# Patient Record
Sex: Female | Born: 1965 | Race: White | Hispanic: No | Smoking: Current every day smoker
Health system: Southern US, Community
[De-identification: ages and names within clinical notes are randomized; demographics above are authoritative.]

## PROBLEM LIST (undated history)

## (undated) ENCOUNTER — Ambulatory Visit: Payer: BC Managed Care – PPO

## (undated) DIAGNOSIS — M545 Low back pain: Secondary | ICD-10-CM

## (undated) DIAGNOSIS — K5792 Diverticulitis of intestine, part unspecified, without perforation or abscess without bleeding: Secondary | ICD-10-CM

## (undated) DIAGNOSIS — K589 Irritable bowel syndrome without diarrhea: Secondary | ICD-10-CM

## (undated) DIAGNOSIS — R310 Gross hematuria: Secondary | ICD-10-CM

## (undated) DIAGNOSIS — K2900 Acute gastritis without bleeding: Secondary | ICD-10-CM

## (undated) DIAGNOSIS — M461 Sacroiliitis, not elsewhere classified: Secondary | ICD-10-CM

## (undated) DIAGNOSIS — A0472 Enterocolitis due to Clostridium difficile, not specified as recurrent: Secondary | ICD-10-CM

## (undated) DIAGNOSIS — K5732 Diverticulitis of large intestine without perforation or abscess without bleeding: Secondary | ICD-10-CM

## (undated) DIAGNOSIS — M546 Pain in thoracic spine: Secondary | ICD-10-CM

## (undated) DIAGNOSIS — N200 Calculus of kidney: Secondary | ICD-10-CM

## (undated) DIAGNOSIS — I10 Essential (primary) hypertension: Secondary | ICD-10-CM

## (undated) DIAGNOSIS — L748 Other eccrine sweat disorders: Secondary | ICD-10-CM

## (undated) DIAGNOSIS — N83209 Unspecified ovarian cyst, unspecified side: Secondary | ICD-10-CM

## (undated) DIAGNOSIS — G4700 Insomnia, unspecified: Secondary | ICD-10-CM

## (undated) DIAGNOSIS — K37 Unspecified appendicitis: Secondary | ICD-10-CM

## (undated) DIAGNOSIS — R109 Unspecified abdominal pain: Secondary | ICD-10-CM

## (undated) DIAGNOSIS — Z87442 Personal history of urinary calculi: Secondary | ICD-10-CM

## (undated) HISTORY — PX: TONSILLECTOMY: SUR1361

## (undated) HISTORY — DX: Pain in thoracic spine: M54.6

## (undated) HISTORY — DX: Unspecified appendicitis: K37

## (undated) HISTORY — DX: Other eccrine sweat disorders: L74.8

## (undated) HISTORY — PX: HEMORRHOID SURGERY: SHX153

## (undated) HISTORY — PX: OTHER SURGICAL HISTORY: SHX169

## (undated) HISTORY — PX: ABDOMINAL HYSTERECTOMY: SHX81

## (undated) HISTORY — PX: TOTAL ABDOMINAL HYSTERECTOMY W/ BILATERAL SALPINGOOPHORECTOMY: SHX83

## (undated) HISTORY — DX: Calculus of kidney: N20.0

## (undated) HISTORY — DX: Gross hematuria: R31.0

## (undated) HISTORY — DX: Acute gastritis without bleeding: K29.00

## (undated) HISTORY — DX: Personal history of urinary calculi: Z87.442

## (undated) HISTORY — DX: Diverticulitis of large intestine without perforation or abscess without bleeding: K57.32

## (undated) HISTORY — DX: Insomnia, unspecified: G47.00

## (undated) HISTORY — DX: Unspecified abdominal pain: R10.9

## (undated) HISTORY — PX: TUBAL LIGATION: SHX77

## (undated) HISTORY — DX: Diverticulitis of intestine, part unspecified, without perforation or abscess without bleeding: K57.92

## (undated) HISTORY — DX: Sacroiliitis, not elsewhere classified: M46.1

## (undated) HISTORY — DX: Enterocolitis due to Clostridium difficile, not specified as recurrent: A04.72

## (undated) HISTORY — DX: Low back pain: M54.5

## (undated) HISTORY — PX: LITHOTRIPSY: SUR834

## (undated) HISTORY — DX: Unspecified ovarian cyst, unspecified side: N83.209

---

## 1997-11-21 HISTORY — PX: OTHER SURGICAL HISTORY: SHX169

## 2004-01-20 ENCOUNTER — Ambulatory Visit: Payer: Self-pay | Admitting: Surgery

## 2004-04-13 ENCOUNTER — Encounter: Payer: Self-pay | Admitting: Specialist

## 2004-05-06 ENCOUNTER — Encounter: Payer: Self-pay | Admitting: Specialist

## 2006-03-17 ENCOUNTER — Ambulatory Visit: Payer: Self-pay | Admitting: Urology

## 2008-04-05 ENCOUNTER — Ambulatory Visit: Payer: Self-pay | Admitting: Oncology

## 2010-05-06 ENCOUNTER — Ambulatory Visit: Payer: Self-pay | Admitting: Unknown Physician Specialty

## 2010-10-23 ENCOUNTER — Ambulatory Visit: Payer: Self-pay | Admitting: Urology

## 2010-11-02 ENCOUNTER — Ambulatory Visit: Payer: Self-pay | Admitting: Urology

## 2010-11-10 ENCOUNTER — Ambulatory Visit: Payer: Self-pay | Admitting: Urology

## 2010-11-12 ENCOUNTER — Ambulatory Visit: Payer: Self-pay | Admitting: Urology

## 2010-11-19 ENCOUNTER — Ambulatory Visit: Payer: Self-pay | Admitting: Urology

## 2010-11-25 ENCOUNTER — Ambulatory Visit: Payer: Self-pay | Admitting: Urology

## 2010-11-26 ENCOUNTER — Ambulatory Visit: Payer: Self-pay | Admitting: Urology

## 2010-11-26 HISTORY — PX: CYSTOSCOPY: SUR368

## 2010-12-01 ENCOUNTER — Ambulatory Visit: Payer: Self-pay | Admitting: Urology

## 2010-12-03 ENCOUNTER — Ambulatory Visit: Payer: Self-pay | Admitting: Urology

## 2010-12-05 ENCOUNTER — Emergency Department: Payer: Self-pay | Admitting: Physician Assistant

## 2010-12-10 ENCOUNTER — Ambulatory Visit: Payer: Self-pay | Admitting: Urology

## 2010-12-17 ENCOUNTER — Ambulatory Visit: Payer: Self-pay | Admitting: Urology

## 2010-12-23 ENCOUNTER — Ambulatory Visit: Payer: Self-pay | Admitting: Urology

## 2011-01-07 ENCOUNTER — Ambulatory Visit: Payer: Self-pay | Admitting: Urology

## 2011-01-21 ENCOUNTER — Ambulatory Visit: Payer: Self-pay | Admitting: Urology

## 2011-06-29 ENCOUNTER — Ambulatory Visit: Payer: Self-pay

## 2011-11-30 ENCOUNTER — Ambulatory Visit: Payer: Self-pay | Admitting: Urology

## 2011-12-23 ENCOUNTER — Ambulatory Visit: Payer: Self-pay | Admitting: Urology

## 2011-12-27 ENCOUNTER — Ambulatory Visit: Payer: Self-pay | Admitting: Urology

## 2012-02-01 ENCOUNTER — Ambulatory Visit: Payer: Self-pay | Admitting: Urology

## 2012-07-20 ENCOUNTER — Ambulatory Visit: Payer: Self-pay | Admitting: Gastroenterology

## 2012-07-21 LAB — PATHOLOGY REPORT

## 2012-08-01 ENCOUNTER — Ambulatory Visit: Payer: Self-pay | Admitting: Gastroenterology

## 2012-09-11 ENCOUNTER — Emergency Department: Payer: Self-pay | Admitting: Emergency Medicine

## 2012-09-11 LAB — URINALYSIS, COMPLETE
Bilirubin,UR: NEGATIVE
Glucose,UR: NEGATIVE mg/dL (ref 0–75)
Leukocyte Esterase: NEGATIVE
Ph: 5 (ref 4.5–8.0)
Protein: NEGATIVE
Specific Gravity: 1.021 (ref 1.003–1.030)
Squamous Epithelial: 16
WBC UR: 2 /HPF (ref 0–5)

## 2012-09-11 LAB — CBC
HCT: 44.1 % (ref 35.0–47.0)
MCH: 32.1 pg (ref 26.0–34.0)
MCHC: 34.6 g/dL (ref 32.0–36.0)
MCV: 93 fL (ref 80–100)
Platelet: 274 10*3/uL (ref 150–440)
RBC: 4.75 10*6/uL (ref 3.80–5.20)
RDW: 14.2 % (ref 11.5–14.5)
WBC: 12.4 10*3/uL — ABNORMAL HIGH (ref 3.6–11.0)

## 2012-09-11 LAB — COMPREHENSIVE METABOLIC PANEL
Albumin: 3.8 g/dL (ref 3.4–5.0)
Alkaline Phosphatase: 108 U/L (ref 50–136)
Anion Gap: 6 — ABNORMAL LOW (ref 7–16)
Bilirubin,Total: 0.3 mg/dL (ref 0.2–1.0)
Calcium, Total: 8.9 mg/dL (ref 8.5–10.1)
Creatinine: 0.67 mg/dL (ref 0.60–1.30)
EGFR (African American): >60
EGFR (Non-African Amer.): >60
Osmolality: 275 (ref 275–301)
Potassium: 3.7 mmol/L (ref 3.5–5.1)
SGOT(AST): 15 U/L (ref 15–37)
SGPT (ALT): 18 U/L (ref 12–78)
Sodium: 138 mmol/L (ref 136–145)

## 2012-09-11 LAB — LIPASE, BLOOD: Lipase: 88 U/L (ref 73–393)

## 2012-11-02 ENCOUNTER — Ambulatory Visit: Payer: Self-pay | Admitting: Urology

## 2013-01-30 ENCOUNTER — Ambulatory Visit: Payer: Self-pay | Admitting: Urology

## 2013-05-19 ENCOUNTER — Ambulatory Visit: Payer: Self-pay | Admitting: Emergency Medicine

## 2013-05-24 ENCOUNTER — Ambulatory Visit: Payer: Self-pay | Admitting: Urology

## 2013-07-02 ENCOUNTER — Ambulatory Visit: Payer: Self-pay | Admitting: Obstetrics and Gynecology

## 2013-07-02 LAB — HEMOGLOBIN: HGB: 15.2 g/dL (ref 12.0–16.0)

## 2013-07-05 LAB — PATHOLOGY REPORT

## 2013-07-19 ENCOUNTER — Ambulatory Visit: Payer: Self-pay | Admitting: Obstetrics and Gynecology

## 2013-08-03 HISTORY — PX: ABDOMINAL HYSTERECTOMY: SUR658

## 2013-08-03 HISTORY — PX: TOTAL ABDOMINAL HYSTERECTOMY: SHX209

## 2013-08-14 ENCOUNTER — Ambulatory Visit: Payer: Self-pay | Admitting: Obstetrics and Gynecology

## 2013-08-14 LAB — CBC
HCT: 44.8 % (ref 35.0–47.0)
HGB: 15.2 g/dL (ref 12.0–16.0)
MCH: 32.1 pg (ref 26.0–34.0)
MCHC: 33.9 g/dL (ref 32.0–36.0)
MCV: 95 fL (ref 80–100)
Platelet: 280 10*3/uL (ref 150–440)
RBC: 4.72 10*6/uL (ref 3.80–5.20)
RDW: 14.2 % (ref 11.5–14.5)
WBC: 9.4 10*3/uL (ref 3.6–11.0)

## 2013-08-14 LAB — BASIC METABOLIC PANEL
Anion Gap: 5 — ABNORMAL LOW (ref 7–16)
BUN: 16 mg/dL (ref 7–18)
CO2: 28 mmol/L (ref 21–32)
CREATININE: 0.67 mg/dL (ref 0.60–1.30)
Calcium, Total: 9.1 mg/dL (ref 8.5–10.1)
Chloride: 107 mmol/L (ref 98–107)
EGFR (African American): >60
EGFR (Non-African Amer.): >60
Glucose: 68 mg/dL (ref 65–99)
Osmolality: 279 (ref 275–301)
Potassium: 4 mmol/L (ref 3.5–5.1)
Sodium: 140 mmol/L (ref 136–145)

## 2013-08-20 ENCOUNTER — Ambulatory Visit: Payer: Self-pay | Admitting: Obstetrics and Gynecology

## 2013-08-21 LAB — HEMOGLOBIN: HGB: 12.3 g/dL (ref 12.0–16.0)

## 2013-08-22 LAB — PATHOLOGY REPORT

## 2014-03-11 ENCOUNTER — Ambulatory Visit: Payer: Self-pay | Admitting: Urology

## 2014-07-24 ENCOUNTER — Ambulatory Visit
Admit: 2014-07-24 | Disposition: A | Discharge status: Home / Self Care | Payer: Self-pay | Attending: Obstetrics and Gynecology | Admitting: Obstetrics and Gynecology

## 2014-07-27 NOTE — Consult Note (Signed)
Brief Consult Note: Diagnosis: 50 yr old female with vaginal hysterectomy develped symptomatic bradycardia with opiates.seen in room.   Patient was seen by consultant.   Consult note dictated.   Orders entered.   Comments: 49 yr old female s/p vaginal TAH with bradycardia due to opates avoid IV morphine,fentanyl,dialudid(any opate form),try tyelenol and toradol as you are doing,some evidece to give IV magenseium to help decrease post op pain:will order that.  Electronic Signatures: Epifanio Lesches (MD)  (Signed 18-May-15 17:29)  Authored: Brief Consult Note   Last Updated: 18-May-15 17:29 by Epifanio Lesches (MD)

## 2014-07-27 NOTE — Op Note (Signed)
PATIENT NAME:  Morgan Cisneros, Morgan Cisneros MR#:  [id number] DATE OF BIRTH:  [birth year]  DATE OF PROCEDURE:  07/02/2013  PREOPERATIVE DIAGNOSIS: Chronic pelvic pain.   POSTOPERATIVE DIAGNOSES:  1. Chronic pelvic pain.  2. Endometriosis.  3. Pelvic adhesive disease.  4. Family history of ovarian cancer.  5. Bilateral ovarian cysts.   OPERATIVE PROCEDURES:  1. Laparoscopy with excision and fulguration of endometriosis.  2. Lysis of adhesions.   SURGEON: Alanda Slim. Kyndra Condron, MD  FIRST ASSISTANT: None.   ANESTHESIA: General endotracheal.   INDICATIONS: The patient is a 49 year old white female, P2, 0-0-2, status post BTL for contraception, who presents for evaluation of chronic pelvic pain and family history of ovarian cancer. The patient has significant worsening symptoms of dysmenorrhea, deep thrusting dyspareunia, and menorrhagia.   FINDINGS AT SURGERY: Revealed pelvic adhesions between the omentum and the left fallopian tube. These adhesions were lysed with Bovie cautery and scissors. This restored normal anatomy. There was evidence of endometriosis with powder burn implants in the left ovarian fossa, right uterosacral ligament, as well as white lesions being seen on the bladder flap and uterus. Representative excisional biopsies of the uterosacral ligament and left ovarian fossa were taken; these sites were also cauterized. Upper abdomen was grossly normal.   DESCRIPTION OF PROCEDURE: The patient was brought to the operating room where she was placed in the supine position. General endotracheal anesthesia was induced without difficulty. She was placed in dorsal lithotomy position using the bumblebee stirrups. A ChloraPrep and Betadine abdominal, perineal, intravaginal prep and drape was performed in standard fashion. Red rubber Catheter was used to drain 50 mL of urine from the bladder. A Hulka tenaculum was placed onto the cervix. The cervix was noted to have IUD strings present at the os. Uterus is  anteverted, mobile and of normal size and shape. The pelvis was gynecoid. The laparoscopy was then performed in standard fashion. A subumbilical vertical incision 5 mm in length was made. The Optiview laparoscopic trocar system was used to place the 5-mm port directly into the abdominopelvic cavity under direct visualization without evidence of bowel or vascular injury. A second 5-mm port was placed in the suprapubic region under direct visualization. The above-noted findings were photo documented. The adhesiolysis between the fallopian tube and omentum was performed with Bovie cautery and scissors. Good hemostasis was noted. The endometriosis implants were photo documented. The left ovarian fossa and right uterosacral ligaments powder burn implants were then excised and cauterized using Kleppinger bipolar forceps. The representative samples of tissue were sent to pathology. Good hemostasis was noted. Upon completion of the procedure and survey of the upper abdomen, the procedure was terminated with all instrumentation being removed from the abdomen. The pneumoperitoneum was released. The incisions were closed with of 4-0 Vicryl suture in a simple interrupted manner. Band-Aids were placed over the incisions. IV fluids were 1600 mL urine output was 50 mL.   ESTIMATED BLOOD LOSS: Minimal.   COUNTS: All needle, and sponge counts instruments were verified as correct.   ADDENDUM: Please note that this patient is an excellent LAVH/BSO candidate.    ____________________________ Alanda Slim. Lithzy Bernard, MD mad:lt D: 07/02/2013 16:07:59 ET T: 07/03/2013 00:09:21 ET JOB#: [id number]  cc: Hassell Done A. Kayliee Atienza, MD, <Dictator> [place] MD ELECTRONICALLY SIGNED 07/13/2013 4:44

## 2014-07-27 NOTE — Op Note (Signed)
PATIENT NAME:  Morgan Cisneros, Morgan Cisneros MR#:  [id number] DATE OF BIRTH:  [birth year]  DATE OF PROCEDURE:  08/20/2013  PREOPERATIVE DIAGNOSES:  1.  Chronic pelvic pain.  2.  Symptomatic endometriosis.  3.  Family history of ovarian cancer.   POSTOPERATIVE DIAGNOSES: 1.  Chronic pelvic pain.  2.  Symptomatic endometriosis.  3.  Family history of ovarian cancer.   OPERATIVE PROCEDURE: Laparoscopic-assisted vaginal hysterectomy, bilateral salpingo-oophorectomy.   SURGEON: Brayton Mars, M.D.   FIRST ASSISTANT: Dr. Marcelline Mates.  ANESTHESIA: General endotracheal.   INDICATIONS: The patient is a 49 year old white female, para 2-0-0-2, status post BTL for contraception with known history of endometriosis, recently status post laparoscopy with excision and fulguration of endometriosis and adhesiolysis, who presents for definitive surgery. Mom has ovarian cancer. The patient desires prophylactic oophorectomy.   FINDINGS AT SURGERY: Revealed a left ovarian endometrioma. There also were some endometriosis implants along the left pelvic sidewall. The bladder flap had mild scarring.   DESCRIPTION OF PROCEDURE: The patient was brought to the operating room where she was placed in the supine position. General endotracheal anesthesia was induced without difficulty. She was placed in the dorsal lithotomy position using the bumblebee stirrups. A ChloraPrep and Betadine abdominal, perineal, intravaginal prep and drape was performed in standard fashion. Foley catheter was placed and was draining clear yellow urine from the bladder. Hulka tenaculum was placed on the cervix to facilitate uterine manipulation. Subumbilical vertical incision 5 mm in length was made. The Optiview laparoscopic trocar system was used to place a 5 mm port directly into the abdominopelvic cavity. No bowel or vascular injury was encountered. Two other 5 mm ports were placed in the right and left lower quadrants respectively under direct  visualization. The above-noted findings were photo documented. The procedure was then performed in a standard manner. The Ace Harmonic scalpel was used during the dissection along with graspers and Kleppinger bipolar cautery forceps. The round ligaments were desiccated with the Harmonic scalpel. The infundibulopelvic ligaments were sequentially grasped, desiccated and cut using the Harmonic scalpel. The mesosalpinx cardinal broad ligament complexes were then likewise clamped, desiccated and cut using the Harmonic scalpel. The anterior-posterior leafs of the broad ligament were skeletonized exposing the uterine vessels. The uterine vessels were then cauterized using Kleppinger bipolar forceps. Bladder flap was created through use of the Harmonic scalpel. Once adequately mobilized, the abdominal portion of the procedure was terminated and the vaginal approach was then performed.   The patient was placed in the high lithotomy position. A weighted speculum was placed into the vagina. The Hulka tenaculum was removed and a double-tooth tenaculum was placed onto the cervix. Posterior colpotomy was made with Mayo scissors. Uterosacral ligaments were clamped, cut, and stick tied using 0 Vicryl suture. The posterior cuff was run using 0 chromic suture in a running baseball stitch manner. Bladder flap was created using the Bovie cautery. Bladder was dissected off the lower uterine segment. The anterior cul-de-sac was entered. The remainder of the cardinal broad ligament complexes were clamped, coagulated and cut using 0 Vicryl suture. The specimen was then removed from the operative field. Good hemostasis was noted on pedicles. The vagina was closed with 2-0 chromic sutures in a simple interrupted manner. Following completion of the vaginal closure, the repeat laparoscopy was performed verifying good hemostasis of all pedicles. Ureters were noted to have normal anatomic orientation and peristalsis. The procedure was then  terminated with all instrumentation being removed from the abdominopelvic cavity. The pneumoperitoneum was released. The abdominal  incisions were closed with Dermabond glue. The patient was then awakened, extubated and taken to the recovery room in satisfactory condition.   ESTIMATED BLOOD LOSS: 100 mL.   IV FLUIDS: 1500 mL.  URINE OUTPUT: 100 mL.  ANTIBIOTICS: The patient did receive Ancef antibiotic prophylaxis.   COUNTS: All instrument, needle and sponge counts were verified as correct.  ____________________________ Alanda Slim. Tennelle Taflinger, MD mad:aw D: 08/20/2013 93:26:71 ET T: 08/20/2013 12:59:20 ET JOB#: [id number]  cc: Hassell Done A. Lakia Gritton, MD, <Dictator> [facility] MD ELECTRONICALLY SIGNED 08/20/2013 13:45

## 2014-07-27 NOTE — Consult Note (Signed)
PATIENT NAME:  Morgan Cisneros, Morgan Cisneros MR#:  [id number] DATE OF BIRTH:  1965/06/03  DATE OF CONSULTATION:  08/20/2013  REFERRING PHYSICIAN:  Dr. Enzo Bi.  CONSULTING PHYSICIAN:  Epifanio Lesches, MD  PRIMARY CARE PHYSICIAN:  Dr. Enzo Bi.   REASON FOR CONSULTATION:  Bradycardia.  HISTORY OF PRESENT ILLNESS:  The patient is a 49 year old female patient admitted after laparoscopic bilateral hysterectomy with bilateral salpingo-oophorectomy, seen in the room. Medical consult was placed in because of bradycardia. The patient developed bradycardia after administration of IV fentanyl and also IV morphine. The patient had a dose of fentanyl and Versed during the procedure and heart rate dropped to as low as 40s. The patient received a dose of atropine in [place]. <<.  After coming to the floor, the patient received morphine 2 mg at 2:30 and also 3:30.   The patient's heart rate dropped to 50s after the second dose at 3:30. The patient also felt like she was going to pass out because of the low heart rate. The patient also got meperidine 12.5 mg at 1:30 and 3 more doses before 1:50.  The patient right now in sinus rhythm, heart rate of 70 beats per minute. Family at bedside. Having significant pain postoperatively. The patient's IV pain meds, especially opiates, are suspended and she is on IV drip Tylenol drip and also Toradol.   PAST MEDICAL HISTORY:  No hypertension. No diabetes.   ALLERGIES:  None.   SOCIAL HISTORY: No smoking. No drinking. Lives with family.   PAST SURGICAL HISTORY: None.   MEDICATIONS: None.   REVIEW OF SYSTEMS:  CONSTITUTIONAL: Denies any fever.  HEENT: No headache. No ear pain. No epistaxis. No difficulty swallowing.  CARDIOVASCULAR: No chest pain.  GASTROINTESTINAL: The patient has some nausea and postoperative abdominal pain.  GENITOURINARY:  The patient's urine output is adequate. PULMONARY:  No  trouble breathing.  CARDIOVASCULAR:  No chest pain.  PSYCHIATRIC:  No  anxiety or insomnia.  NEUROLOGIC:  No history of strokes.   PHYSICAL EXAMINATION:  VITAL SIGNS:  Temperature 97.8, heart rate was at low as 40s in PACU and while she came here, heart rate dropped to 50s at 3:40, but the rest of the time, it was around 70s. Blood pressure 119/72.  Sats 100% on room air.  GENERAL:  The patient alert, awake, oriented, in moderate distress because of the pain.  HEAD:  Atraumatic, normocephalic.  EYES:  Pupils equal, reacting to light. Extraocular movements intact.  ENT: No tympanic membrane congestion. No turbinate hypertrophy. No oropharyngeal erythema. NECK:  Supple. No JVD. No carotid bruit. CARDIOVASCULAR:  S1, S2 regular. No murmurs.  LUNGS:  Clear to auscultation. No wheeze. No rales.  ABDOMEN:  Soft, nontender, nondistended. Bowel sounds present.  EXTREMITIES:  No extremity edema. No cyanosis. No clubbing.  NEUROLOGIC:  Alert, awake, oriented. Cranial nerves II through XII are intact. Power 5/5 upper and lower extremities.  DTRs are 2+ bilaterally.   LABORATORY DATA:  The patient did not have any labs today.  White count was 9.4, hemoglobin 15.2 on the 12th of May. Electrolytes were normal on May 12.  The patient's EKG showed sinus rhythm at 72 beats per minute.   ASSESSMENT AND PLAN: This is a 49 year old female with bradycardia that developed after opiate ingestion administration, so bradycardia is likely secondary to opiates. At this time, we  have limited options, especially in terms of pain control. The patient can be on acetaminophen drip along with Toradol that you are already doing. I would  recommend continuing to hold all the IV pain medications, which are like narcotic pain medications including morphine, fentanyl, IV Dilaudid. The patient can continue p.o. Percocet if she tolerates and use nausea medicine, Phenergan, and see if that helps. The patient's family is aware of this and I told them about the possible side effects with morphine and fentanyl  and meperidine, and we will avoid those IV pain medications at this time.   Time spent on this consult is about 55 minutes.    ____________________________ Epifanio Lesches, MD sk:dmm D: 08/20/2013 17:35:09 ET T: 08/20/2013 19:06:59 ET JOB#: [id number]  cc: Epifanio Lesches, MD, <Dictator> Epifanio Lesches MD ELECTRONICALLY SIGNED 08/30/2013 12:27

## 2014-10-22 ENCOUNTER — Encounter: Payer: Self-pay | Admitting: *Deleted

## 2014-10-24 ENCOUNTER — Other Ambulatory Visit: Payer: Self-pay

## 2014-10-24 LAB — URINALYSIS, COMPLETE
Bilirubin, UA: NEGATIVE
Glucose, UA: NEGATIVE
KETONES UA: NEGATIVE
LEUKOCYTES UA: NEGATIVE
Nitrite, UA: NEGATIVE
PH UA: 7.5 (ref 5.0–7.5)
Protein, UA: NEGATIVE
Specific Gravity, UA: 1.015 (ref 1.005–1.030)
Urobilinogen, Ur: 0.2 mg/dL (ref 0.2–1.0)

## 2014-10-24 LAB — MICROSCOPIC EXAMINATION: Epithelial Cells (non renal): >10 /hpf — AB (ref 0–10)

## 2014-12-13 DIAGNOSIS — M545 Low back pain, unspecified: Secondary | ICD-10-CM | POA: Insufficient documentation

## 2014-12-13 DIAGNOSIS — R300 Dysuria: Secondary | ICD-10-CM | POA: Insufficient documentation

## 2014-12-13 DIAGNOSIS — N393 Stress incontinence (female) (male): Secondary | ICD-10-CM | POA: Insufficient documentation

## 2014-12-13 DIAGNOSIS — N200 Calculus of kidney: Secondary | ICD-10-CM | POA: Insufficient documentation

## 2014-12-13 HISTORY — DX: Low back pain, unspecified: M54.50

## 2014-12-23 ENCOUNTER — Other Ambulatory Visit: Payer: Self-pay | Admitting: Obstetrics and Gynecology

## 2014-12-30 ENCOUNTER — Other Ambulatory Visit: Payer: Self-pay | Admitting: Obstetrics and Gynecology

## 2014-12-30 NOTE — Telephone Encounter (Signed)
pls advise

## 2015-01-15 ENCOUNTER — Ambulatory Visit
Admission: RE | Admit: 2015-01-15 | Discharge: 2015-01-15 | Disposition: A | Discharge status: Home / Self Care | Payer: BC Managed Care – PPO | Source: Ambulatory Visit | Attending: Obstetrics and Gynecology | Admitting: Obstetrics and Gynecology

## 2015-01-15 ENCOUNTER — Ambulatory Visit (INDEPENDENT_AMBULATORY_CARE_PROVIDER_SITE_OTHER): Payer: BC Managed Care – PPO | Admitting: Obstetrics and Gynecology

## 2015-01-15 ENCOUNTER — Encounter: Payer: Self-pay | Admitting: Obstetrics and Gynecology

## 2015-01-15 VITALS — BP 113/75 | HR 80 | Ht 65.0 in | Wt 145.4 lb

## 2015-01-15 DIAGNOSIS — Z8041 Family history of malignant neoplasm of ovary: Secondary | ICD-10-CM

## 2015-01-15 DIAGNOSIS — F17210 Nicotine dependence, cigarettes, uncomplicated: Secondary | ICD-10-CM | POA: Insufficient documentation

## 2015-01-15 DIAGNOSIS — Z720 Tobacco use: Secondary | ICD-10-CM

## 2015-01-15 DIAGNOSIS — R590 Localized enlarged lymph nodes: Secondary | ICD-10-CM | POA: Diagnosis present

## 2015-01-15 DIAGNOSIS — Z800 Family history of malignant neoplasm of digestive organs: Secondary | ICD-10-CM | POA: Diagnosis not present

## 2015-01-15 MED ORDER — DOXYCYCLINE HYCLATE 100 MG PO CAPS: 100.0000 mg | ORAL_CAPSULE | Freq: Two times a day (BID) | ORAL | Status: DC

## 2015-01-15 NOTE — Patient Instructions (Addendum)
1.  CBC. 2.  Comprehensive metabolic panel. 3.  ESR. 4.  PA and lateral chest x-ray. 5.  Return in 1 week for follow-up. 6.  Doxycycline 100 mg twice a day 14 days

## 2015-01-15 NOTE — Progress Notes (Signed)
Patient ID: Morgan Cisneros, female   DOB: [birth year], 49 y.o.   MRN: [id number] Bump under right arm  red swollen and tender x 2d No fever No draining or oozing ma mmo- 07/2014- wnl  GYN ENCOUNTER NOTE  Subjective:       Morgan Cisneros is a 49 y.o. No obstetric history on file. female is here for gynecologic evaluation of the following issues:  1.Tender arm pit  The patient is a 49 year old white female, menopausal, on estradiol patch, presents for evaluation of 2 day history of right axillary tenderness and lumps.  Symptoms occur 2 days ago.  She does not recall any trauma to the extremity.  She does not have any scratches or cuts on her extremity.  She denies fever, chills or sweats.  No history of cough, sore throat, congestion.  Patient is a smoker. Patient has family history of colon cancer, ovarian cancer in her mother. Recent mammogram was negative.   Gynecologic History No LMP recorded. Patient has had a hysterectomy. Contraception: Status post hysterectomy Last mammogram: ma mmo- 07/2014- wnl  Obstetric History OB History  No data available    Past Medical History  Diagnosis Date  . Renal calculus, right   . Thoracic back pain     Right sided  . Ovarian cyst   . Flank pain   . Abdominal pain   . Gross hematuria   . History of kidney stones     Past Surgical History  Procedure Laterality Date  . Abdominal hysterectomy  08/2013  . Cystoscopy  11/26/2010    with stent placement  . Lithotripsy      X 4  . Tonsillectomy    . Tubal ligation    . Ureteroscopy Right 11/21/1997    with holmium laser    Current Outpatient Prescriptions on File Prior to Visit  Medication Sig Dispense Refill  . CHANTIX 1 MG tablet TAKE 1 TABLET BY MOUTH TWICE DAILY. 60 tablet 0  . Meth-Hyo-M Bl-Na Phos-Ph Sal (URIBEL) 118 MG CAPS Take 118 mg by mouth as needed.    Marland Kitchen MINIVELLE 0.05 MG/24HR patch UNWRAP AND APPLY 1 PATCH TO SKIN EVERY 3 TO 4 DAYS 24 patch 0  . polyethylene  glycol (MIRALAX / GLYCOLAX) packet Take 17 g by mouth daily.     No current facility-administered medications on file prior to visit.    Allergies  Allergen Reactions  . Urocit-K [Potassium Citrate] Nausea Only    Social History   Social History  . Marital Status: Married    Spouse Name: N/A  . Number of Children: N/A  . Years of Education: N/A   Occupational History  . Not on file.   Social History Main Topics  . Smoking status: Former Research scientist (life sciences)  . Smokeless tobacco: Not on file  . Alcohol Use: 0.0 oz/week    0 Standard drinks or equivalent per week     Comment: occasional  . Drug Use: No  . Sexual Activity: Yes    Birth Control/ Protection: Surgical   Other Topics Concern  . Not on file   Social History Narrative    Family History  Problem Relation Age of Onset  . Kidney Stones Mother   . Ovarian cancer Mother   . Colon cancer Mother     The following portions of the patient's history were reviewed and updated as appropriate: allergies, current medications, past family history, past medical history, past social history, past surgical history and problem list.  Review  of Systems Review of Systems - General ROS: negative for - chills, fatigue, fever, hot flashes, malaise or night sweats Hematological and Lymphatic ROS: negative for - bleeding problems.  POSITIVE-glands and right axilla, swollen Gastrointestinal ROS: negative for - abdominal pain, blood in stools, change in bowel habits and nausea/vomiting Musculoskeletal ROS: negative for - joint pain, muscle pain or muscular weakness Genito-Urinary ROS: negative for - change in menstrual cycle, dysmenorrhea, dyspareunia, dysuria, genital discharge, genital ulcers, hematuria, incontinence, irregular/heavy menses, nocturia or pelvic pain  Objective:   BP 113/75 mmHg  Pulse 80  Ht $R'5\' 5"'VU$  (1.651 m)  Wt 145 lb 6.4 oz (65.953 kg)  BMI 24.20 kg/m2 CONSTITUTIONAL: Well-developed, well-nourished female in no acute  distress.  HENT:  Normocephalic, atraumatic.  NECK: Normal range of motion, supple, no masses; No supraclavicular adenopathy; no anterior neck adenopathy,  Normal thyroid.  SKIN: Skin is warm and dry. No rash noted. Not diaphoretic. No erythema. No pallor. [place]: Alert and oriented to person, place, and time. PSYCHIATRIC: Normal mood and affect. Normal behavior. Normal judgment and thought content. CARDIOVASCULAR:Regular rate and rhythm without murmur RESPIRATORY: Normal breath sounds BREASTS: Bilaterally symmetric without dominant mass, adenopathy or nipple discharge; Right axillary adenopathy with Matted  Nodes, Tender ABDOMEN: Soft, non distended; Non tender.  No Organomegaly. PELVIC:Deferred MUSCULOSKELETAL: Normal range of motion. No tenderness.  No cyanosis, clubbing, or edema.     Assessment:   1. Axillary adenopathy, Right, unclear etiology    Plan:    - CBC w/Diff - Sed Rate (ESR) - Comp Met (CMET) - DG Chest 2 View; Future -Return in 1 week Doxycycline 100 mg twice a day for 14 days  Brayton Mars, MD

## 2015-01-16 ENCOUNTER — Telehealth: Payer: Self-pay

## 2015-01-16 LAB — COMPREHENSIVE METABOLIC PANEL
ALBUMIN: 3.8 g/dL (ref 3.5–5.5)
ALT: 33 IU/L — ABNORMAL HIGH (ref 0–32)
AST: 33 IU/L (ref 0–40)
Albumin/Globulin Ratio: 1.4 (ref 1.1–2.5)
Alkaline Phosphatase: 100 IU/L (ref 39–117)
BUN / CREAT RATIO: 17 (ref 9–23)
BUN: 6 mg/dL (ref 6–24)
Bilirubin Total: 0.4 mg/dL (ref 0.0–1.2)
CO2: 19 mmol/L (ref 18–29)
CREATININE: 0.36 mg/dL — AB (ref 0.57–1.00)
Calcium: 8.6 mg/dL — ABNORMAL LOW (ref 8.7–10.2)
Chloride: 100 mmol/L (ref 97–108)
GFR calc Af Amer: 149 mL/min/{1.73_m2} (ref >59)
GFR calc non Af Amer: 129 mL/min/{1.73_m2} (ref >59)
GLUCOSE: 84 mg/dL (ref 65–99)
Globulin, Total: 2.7 g/dL (ref 1.5–4.5)
Potassium: 3.9 mmol/L (ref 3.5–5.2)
Sodium: 139 mmol/L (ref 134–144)
Total Protein: 6.5 g/dL (ref 6.0–8.5)

## 2015-01-16 LAB — CBC WITH DIFFERENTIAL/PLATELET
Basophils Absolute: 0 10*3/uL (ref 0.0–0.2)
Basos: 0 %
EOS (ABSOLUTE): 0.1 10*3/uL (ref 0.0–0.4)
Eos: 2 %
HEMOGLOBIN: 11.7 g/dL (ref 11.1–15.9)
Hematocrit: 35.5 % (ref 34.0–46.6)
Immature Grans (Abs): 0 10*3/uL (ref 0.0–0.1)
Immature Granulocytes: 0 %
LYMPHS ABS: 2.1 10*3/uL (ref 0.7–3.1)
LYMPHS: 35 %
MCH: 28.4 pg (ref 26.6–33.0)
MCHC: 33 g/dL (ref 31.5–35.7)
MCV: 86 fL (ref 79–97)
Monocytes Absolute: 0.4 10*3/uL (ref 0.1–0.9)
Monocytes: 7 %
NEUTROS PCT: 56 %
Neutrophils Absolute: 3.3 10*3/uL (ref 1.4–7.0)
Platelets: 255 10*3/uL (ref 150–379)
RBC: 4.12 x10E6/uL (ref 3.77–5.28)
RDW: 14.7 % (ref 12.3–15.4)
WBC: 5.9 10*3/uL (ref 3.4–10.8)

## 2015-01-16 LAB — SEDIMENTATION RATE: Sed Rate: 4 mm/hr (ref 0–32)

## 2015-01-16 NOTE — Telephone Encounter (Signed)
Pt aware. Would like chest xray results. Will send mad a message.

## 2015-01-16 NOTE — Telephone Encounter (Signed)
-----  Message from Brayton Mars, MD sent at 01/16/2015  7:58 AM EDT ----- Please Notify - Labs normal CBC and ESR are normal. CMP is normal except for one borderline liver enzyme test, which is barely out of range.  Doubt there is any clinical significance.  With this.

## 2015-01-16 NOTE — Telephone Encounter (Signed)
Pt aware of results 

## 2015-01-16 NOTE — Telephone Encounter (Signed)
Pt aware. Chest xray neg. Advised to keep f/u appt.

## 2015-01-17 ENCOUNTER — Telehealth: Payer: Self-pay | Admitting: Obstetrics and Gynecology

## 2015-01-17 ENCOUNTER — Other Ambulatory Visit: Payer: Self-pay | Admitting: Obstetrics and Gynecology

## 2015-01-17 MED ORDER — TRIAZOLAM 0.25 MG PO TABS: 0.2500 mg | ORAL_TABLET | Freq: Every day | ORAL | Status: DC

## 2015-01-17 NOTE — Telephone Encounter (Signed)
Please let her know it was done

## 2015-01-17 NOTE — Telephone Encounter (Signed)
Pt called and she called her pharmacy for her refill on triazolam .25 one at night, and they told her she was out that they would send in a refill request but we have not gotten it yet and she wanted to see if we can call this in for her at [facility] in [place].

## 2015-01-17 NOTE — Telephone Encounter (Signed)
Notified pt rx sent.

## 2015-01-17 NOTE — Telephone Encounter (Signed)
Refill

## 2015-01-22 ENCOUNTER — Encounter: Payer: Self-pay | Admitting: Obstetrics and Gynecology

## 2015-01-22 ENCOUNTER — Ambulatory Visit (INDEPENDENT_AMBULATORY_CARE_PROVIDER_SITE_OTHER): Payer: BC Managed Care – PPO | Admitting: Obstetrics and Gynecology

## 2015-01-22 VITALS — BP 126/79 | HR 92 | Ht 66.0 in | Wt 147.2 lb

## 2015-01-22 DIAGNOSIS — R590 Localized enlarged lymph nodes: Secondary | ICD-10-CM

## 2015-01-22 NOTE — Progress Notes (Signed)
Patient ID: Morgan Cisneros, female   DOB: 20-Apr-1965, 49 y.o.   MRN: [id number] 1 week f/iu axillary adenopathy  labs and chest xray -wnl On doxycycline sx getting better  Chief complaint: 1.  Right axillary adenopathy-follow-up.  Patient presents for 1 week follow-up after being diagnosed with right axillary adenopathy approximately 7 days ago.  Workup including chest x-ray, CBC, ESR, were negative.  She has been treated with doxycycline 100 mg twice a day for planned 2 week course.  Patient states that she is feeling remarkably better with subsequent resolution of her axillary lymphadenopathy.She denies fevers, chills, sweats, sore throat, cough, congestion, lymph node tenderness, lymph node pain.  Past medical history, surgical history, problem list, medications, and allergies are reviewed.  Review of systems: Negative per HPI  OBJECTIVE: BP 126/79 mmHg  Pulse 92  Ht _0  (1.676 m)  Wt 147 lb 3.2 oz (66.769 kg)  BMI 23.77 kg/m2  Pleasant, well-appearing white female in no acute distress. HEENT exam normal. Neck supple without thyromegaly or adenopathy. Breasts-symmetric, nontender, without abnormality; previously noted axillary adenopathy is resolved; axilla, nontender.  IMPRESSION: 1.  Axillary adenopathy, unclear etiology, resolving.  PLAN: 1.  Complete course of doxycycline 100 mg twice a day for 14 days. 2.  Return as needed if symptoms recur.  A total of 15 minutes were spent face-to-face with the patient during this encounter and over half of that time dealt with counseling and coordination of care.  Brayton Mars, MD

## 2015-01-26 NOTE — Patient Instructions (Signed)
1.  Complete course of doxycycline 100 mg twice a day for 14 days.. 2.  Return as needed if symptoms recur.

## 2015-02-04 ENCOUNTER — Encounter: Payer: Self-pay | Admitting: Obstetrics and Gynecology

## 2015-03-11 ENCOUNTER — Telehealth: Payer: Self-pay | Admitting: Obstetrics and Gynecology

## 2015-03-11 NOTE — Telephone Encounter (Signed)
[facility] don't have have minivelle and its on back order.  and she would like something else or  Amy do we have samples until she can get some. Also can dosage be uped a little.

## 2015-03-11 NOTE — Telephone Encounter (Signed)
Samples provided for pt

## 2015-03-26 ENCOUNTER — Encounter: Payer: Self-pay | Admitting: Obstetrics and Gynecology

## 2015-04-08 ENCOUNTER — Other Ambulatory Visit: Payer: Self-pay | Admitting: Obstetrics and Gynecology

## 2015-04-18 ENCOUNTER — Encounter: Payer: Self-pay | Admitting: Family Medicine

## 2015-04-18 ENCOUNTER — Ambulatory Visit (INDEPENDENT_AMBULATORY_CARE_PROVIDER_SITE_OTHER): Payer: BC Managed Care – PPO | Admitting: Family Medicine

## 2015-04-18 VITALS — BP 116/77 | HR 78 | Temp 97.9°F | Ht 64.5 in | Wt 144.0 lb

## 2015-04-18 DIAGNOSIS — G4700 Insomnia, unspecified: Secondary | ICD-10-CM | POA: Diagnosis not present

## 2015-04-18 DIAGNOSIS — K219 Gastro-esophageal reflux disease without esophagitis: Secondary | ICD-10-CM | POA: Diagnosis not present

## 2015-04-18 DIAGNOSIS — N951 Menopausal and female climacteric states: Secondary | ICD-10-CM | POA: Diagnosis not present

## 2015-04-18 DIAGNOSIS — Z23 Encounter for immunization: Secondary | ICD-10-CM

## 2015-04-18 DIAGNOSIS — N200 Calculus of kidney: Secondary | ICD-10-CM | POA: Insufficient documentation

## 2015-04-18 MED ORDER — DULOXETINE HCL 20 MG PO CPEP: 20.0000 mg | ORAL_CAPSULE | Freq: Every day | ORAL | Status: DC

## 2015-04-18 NOTE — Progress Notes (Signed)
BP 116/77 mmHg  Pulse 78  Temp(Src) 97.9 F (36.6 C)  Ht 5' 4.5" (1.638 m)  Wt 144 lb (65.318 kg)  BMI 24.34 kg/m2  SpO2 99%   Subjective:    Patient ID: Morgan Cisneros, female    DOB: 11/07/1965, 50 y.o.   MRN: [id number]  HPI: Morgan Cisneros is a 50 y.o. female who presents today to establish care.   Chief Complaint  Patient presents with  . Gastroesophageal Reflux    Patient is taking Zantac  . Insomnia  . Medication Dose Change    Patient is now taking hormone patches, but she is having side effects she wants to know if there is anything that can be added.   GERD- has been going on since July, worse the past month. Started Zantac the past couple of days GERD control status: uncontrolled  Satisfied with current treatment? yes Heartburn frequency: once a week Medication side effects: no, tried the nexium for a couple of months and it didn't seem to help Medication compliance: better Previous GERD medications: zantac, nexium Antacid use frequency: daily  Duration: a couple of months, worse the past month Nature: burning  Location: throat Heartburn duration: all day Alleviatiating factors:  Zantac and diet Aggravating factors: food choices Dysphagia: no Odynophagia:  yes Hematemesis: no Blood in stool: no EGD: no  INSOMNIA Duration: chronic Satisfied with sleep quality: no Difficulty falling asleep: yes Difficulty staying asleep: yes Waking a few hours after sleep onset: yes Early morning awakenings: no Daytime hypersomnolence: no Wakes feeling refreshed: no Good sleep hygiene: no Apnea: no Snoring: no Depressed/anxious mood: yes Recent stress: yes Restless legs/nocturnal leg cramps: no Chronic pain/arthritis: no History of sleep study: no Treatments attempted: melatonin, uinsom and benadryl   MENOPAUSAL SYMPTOMS- 18 months since her hysterectomy. Has gotten worse since July Duration: worse in the last 6 months Symptom severity: moderate Hot  flashes: yes Night sweats: yes Sleep disturbances: yes Vaginal dryness: yes Dyspareunia:no Decreased libido: no Emotional lability: yes Stress incontinence: no Previous HRT/pharmacotherapy: yes Hysterectomy: yes Absolute Contraindications to Hormonal Therapy: No    Undiagnosed vaginal bleeding: no    Breast cancer: no    Endometrial cancer: no    Coronary disease: no    Cerebrovascular disease: no    Venous thromboembolic disease: no  Relevant past medical, surgical, family and social history reviewed and updated as indicated. Interim medical history since our last visit reviewed. Allergies and medications reviewed and updated.  Review of Systems  Constitutional: Positive for diaphoresis and fatigue. Negative for fever, chills, activity change, appetite change and unexpected weight change.  Respiratory: Negative.   Cardiovascular: Negative.   Musculoskeletal: Negative.   Neurological: Negative.   Psychiatric/Behavioral: Positive for sleep disturbance, dysphoric mood, decreased concentration and agitation. Negative for hallucinations, behavioral problems, confusion and self-injury. The patient is nervous/anxious. The patient is not hyperactive.     Per HPI unless specifically indicated above     Objective:    BP 116/77 mmHg  Pulse 78  Temp(Src) 97.9 F (36.6 C)  Ht 5' 4.5" (1.638 m)  Wt 144 lb (65.318 kg)  BMI 24.34 kg/m2  SpO2 99%  Wt Readings from Last 3 Encounters:  04/18/15 144 lb (65.318 kg)  01/22/15 147 lb 3.2 oz (66.769 kg)  01/15/15 145 lb 6.4 oz (65.953 kg)    Physical Exam  Constitutional: She is oriented to person, place, and time. She appears well-developed and well-nourished. No distress.  HENT:  Head: Normocephalic and atraumatic.  Right Ear: Hearing normal.  Left Ear: Hearing normal.  Nose: Nose normal.  Eyes: Conjunctivae and lids are normal. Right eye exhibits no discharge. Left eye exhibits no discharge. No scleral icterus.  Cardiovascular:  Normal rate, regular rhythm, normal heart sounds and intact distal pulses.  Exam reveals no gallop and no friction rub.   No murmur heard. Pulmonary/Chest: Effort normal and breath sounds normal. No respiratory distress. She has no wheezes. She has no rales. She exhibits no tenderness.  Musculoskeletal: Normal range of motion.  Neurological: She is alert and oriented to person, place, and time.  Skin: Skin is warm, dry and intact. No rash noted. No erythema. No pallor.  Psychiatric: She has a normal mood and affect. Her speech is normal and behavior is normal. Judgment and thought content normal. Cognition and memory are normal.  Nursing note and vitals reviewed.   Results for orders placed or performed in visit on 01/15/15  CBC w/Diff  Result Value Ref Range   WBC 5.9 3.4 - 10.8 x10E3/uL   RBC 4.12 3.77 - 5.28 x10E6/uL   Hemoglobin 11.7 11.1 - 15.9 g/dL   Hematocrit 35.5 34.0 - 46.6 %   MCV 86 79 - 97 fL   MCH 28.4 26.6 - 33.0 pg   MCHC 33.0 31.5 - 35.7 g/dL   RDW 14.7 12.3 - 15.4 %   Platelets 255 150 - 379 x10E3/uL   Neutrophils 56 %   Lymphs 35 %   Monocytes 7 %   Eos 2 %   Basos 0 %   Neutrophils Absolute 3.3 1.4 - 7.0 x10E3/uL   Lymphocytes Absolute 2.1 0.7 - 3.1 x10E3/uL   Monocytes Absolute 0.4 0.1 - 0.9 x10E3/uL   EOS (ABSOLUTE) 0.1 0.0 - 0.4 x10E3/uL   Basophils Absolute 0.0 0.0 - 0.2 x10E3/uL   Immature Granulocytes 0 %   Immature Grans (Abs) 0.0 0.0 - 0.1 x10E3/uL  Sed Rate (ESR)  Result Value Ref Range   Sed Rate 4 0 - 32 mm/hr  Comp Met (CMET)  Result Value Ref Range   Glucose 84 65 - 99 mg/dL   BUN 6 6 - 24 mg/dL   Creatinine, Ser 0.36 (L) 0.57 - 1.00 mg/dL   GFR calc non Af Amer 129 >59 mL/min/1.73   GFR calc Af Amer 149 >59 mL/min/1.73   BUN/Creatinine Ratio 17 9 - 23   Sodium 139 134 - 144 mmol/L   Potassium 3.9 3.5 - 5.2 mmol/L   Chloride 100 97 - 108 mmol/L   CO2 19 18 - 29 mmol/L   Calcium 8.6 (L) 8.7 - 10.2 mg/dL   Total Protein 6.5 6.0 - 8.5  g/dL   Albumin 3.8 3.5 - 5.5 g/dL   Globulin, Total 2.7 1.5 - 4.5 g/dL   Albumin/Globulin Ratio 1.4 1.1 - 2.5   Bilirubin Total 0.4 0.0 - 1.2 mg/dL   Alkaline Phosphatase 100 39 - 117 IU/L   AST 33 0 - 40 IU/L   ALT 33 (H) 0 - 32 IU/L      Assessment & Plan:   Problem List Items Addressed This Visit      Digestive   GERD (gastroesophageal reflux disease)    Continue to work on diet and exercise. Continue zantac. Let us know if not getting better or getting worse.         Other   Insomnia    Advised patient that I don't prescribe controlled substances on the first visit. Will obtain records and consider  continuing medication next visit.       Menopausal symptoms - Primary    Discussed causes. Continue patch at current dose for now. Will add cymbalta for other symptoms. Check back in in 1 month to see how she's doing on it.        Other Visit Diagnoses    Immunization due        Tdap given today.     Relevant Orders    Tdap vaccine greater than or equal to 7yo IM (Completed)        Follow up plan: Return in about 4 weeks (around 05/16/2015).

## 2015-04-18 NOTE — Assessment & Plan Note (Signed)
Continue to work on diet and exercise. Continue zantac. Let us know if not getting better or getting worse.

## 2015-04-18 NOTE — Patient Instructions (Addendum)
[facility] [place] [facility], Adult When you have gastroesophageal reflux disease (GERD), the foods you eat and your eating habits are very important. Choosing the right foods can help ease your discomfort.  WHAT GUIDELINES DO I NEED TO FOLLOW?   Choose fruits, vegetables, whole grains, and low-fat dairy products.   Choose low-fat meat, fish, and poultry.  Limit fats such as oils, salad dressings, butter, nuts, and avocado.   Keep a food diary. This helps you identify foods that cause symptoms.   Avoid foods that cause symptoms. These may be different for everyone.   Eat small meals often instead of 3 large meals a day.   Eat your meals slowly, in a place where you are relaxed.   Limit fried foods.   Cook foods using methods other than frying.   Avoid drinking alcohol.   Avoid drinking large amounts of liquids with your meals.   Avoid bending over or lying down until 2-3 hours after eating.  WHAT FOODS ARE NOT RECOMMENDED?  These are some foods and drinks that may make your symptoms worse: Vegetables Tomatoes. Tomato juice. Tomato and spaghetti sauce. Chili peppers. Onion and garlic. Horseradish. Fruits Oranges, grapefruit, and lemon (fruit and juice). Meats High-fat meats, fish, and poultry. This includes hot dogs, ribs, ham, sausage, salami, and bacon. Dairy Whole milk and chocolate milk. Sour cream. Cream. Butter. Ice cream. Cream cheese.  Drinks Coffee and tea. Bubbly (carbonated) drinks or energy drinks. Condiments Hot sauce. Barbecue sauce.  Sweets/Desserts Chocolate and cocoa. Donuts. Peppermint and spearmint. Fats and Oils High-fat foods. This includes [place] fries and potato chips. Other Vinegar. Strong spices. This includes black pepper, white pepper, red pepper, cayenne, curry powder, cloves, ginger, and chili powder. The items listed above may not be a complete list of foods and drinks to avoid. Contact  your dietitian for more information.   This information is not intended to replace advice given to you by your health care provider. Make sure you discuss any questions you have with your health care provider.   Document Released: 09/21/2011 Document Revised: 04/12/2014 Document Reviewed: 01/24/2013 Elsevier Interactive Patient Education 2016 [place] At menopause, your body begins making less estrogen and progesterone hormones. This causes the body to stop having menstrual periods. This is because estrogen and progesterone hormones control your periods and menstrual cycle. A lack of estrogen may cause symptoms such as:  Hot flushes (or hot flashes).  Vaginal dryness.  Dry skin.  Loss of sex drive.  Risk of bone loss (osteoporosis). When this happens, you may choose to take hormone therapy to get back the estrogen lost during menopause. When the hormone estrogen is given alone, it is usually referred to as ET (Estrogen Therapy). When the hormone progestin is combined with estrogen, it is generally called HT (Hormone Therapy). This was formerly known as hormone replacement therapy (HRT). Your caregiver can help you make a decision on what will be best for you. The decision to use HT seems to change often as new studies are done. Many studies do not agree on the benefits of hormone replacement therapy. LIKELY BENEFITS OF HT INCLUDE PROTECTION FROM:  Hot Flushes (also called hot flashes) - A hot flush is a sudden feeling of heat that spreads over the face and body. The skin may redden like a blush. It is connected with sweats and sleep disturbance. Women going through menopause may have hot flushes a few times a month or several  times per day depending on the woman.  Osteoporosis (bone loss) - Estrogen helps guard against bone loss. After menopause, a woman's bones slowly lose calcium and become weak and brittle. As a result, bones are more likely to break. The hip, wrist,  and spine are affected most often. Hormone therapy can help slow bone loss after menopause. Weight bearing exercise and taking calcium with vitamin D also can help prevent bone loss. There are also medications that your caregiver can prescribe that can help prevent osteoporosis.  Vaginal dryness - Loss of estrogen causes changes in the vagina. Its lining may become thin and dry. These changes can cause pain and bleeding during sexual intercourse. Dryness can also lead to infections. This can cause burning and itching. (Vaginal estrogen treatment can help relieve pain, itching, and dryness.)  Urinary tract infections are more common after menopause because of lack of estrogen. Some women also develop urinary incontinence because of low estrogen levels in the vagina and bladder.  Possible other benefits of estrogen include a positive effect on mood and short-term memory in women. RISKS AND COMPLICATIONS  Using estrogen alone without progesterone causes the lining of the uterus to grow. This increases the risk of lining of the uterus (endometrial) cancer. Your caregiver should give another hormone called progestin if you have a uterus.  Women who take combined (estrogen and progestin) HT appear to have an increased risk of breast cancer. The risk appears to be small, but increases throughout the time that HT is taken.  Combined therapy also makes the breast tissue slightly denser which makes it harder to read mammograms (breast X-rays).  Combined, estrogen and progesterone therapy can be taken together every day, in which case there may be spotting of blood. HT therapy can be taken cyclically in which case you will have menstrual periods. Cyclically means HT is taken for a set amount of days, then not taken, then this process is repeated.  HT may increase the risk of stroke, heart attack, breast cancer and forming blood clots in your leg.  Transdermal estrogen (estrogen that is absorbed through the  skin with a patch or a cream) may have better results with:  Cholesterol.  Blood pressure.  Blood clots. Having the following conditions may indicate you should not have HT:  Endometrial cancer.  Liver disease.  Breast cancer.  Heart disease.  History of blood clots.  Stroke. TREATMENT   If you choose to take HT and have a uterus, usually estrogen and progestin are prescribed.  Your caregiver will help you decide the best way to take the medications.  Possible ways to take estrogen include:  Pills.  Patches.  Gels.  Sprays.  Vaginal estrogen cream, rings and tablets.  It is best to take the lowest dose possible that will help your symptoms and take them for the shortest period of time that you can.  Hormone therapy can help relieve some of the problems (symptoms) that affect women at menopause. Before making a decision about HT, talk to your caregiver about what is best for you. Be well informed and comfortable with your decisions. HOME CARE INSTRUCTIONS   Follow your caregivers advice when taking the medications.  A Pap test is done to screen for cervical cancer.  The first Pap test should be done at age 73.  Between ages 51 and 24, Pap tests are repeated every 2 years.  Beginning at age 36, you are advised to have a Pap test every 3 years as long  as the past 3 Pap tests have been normal.  Some women have medical problems that increase the chance of getting cervical cancer. Talk to your caregiver about these problems. It is especially important to talk to your caregiver if a new problem develops soon after your last Pap test. In these cases, your caregiver may recommend more frequent screening and Pap tests.  The above recommendations are the same for women who have or have not gotten the vaccine for HPV (human papillomavirus).  If you had a hysterectomy for a problem that was not a cancer or a condition that could lead to cancer, then you no longer need Pap  tests. However, even if you no longer need a Pap test, a regular exam is a good idea to make sure no other problems are starting.  If you are between ages 66 and 61, and you have had normal Pap tests going back 10 years, you no longer need Pap tests. However, even if you no longer need a Pap test, a regular exam is a good idea to make sure no other problems are starting.  If you have had past treatment for cervical cancer or a condition that could lead to cancer, you need Pap tests and screening for cancer for at least 20 years after your treatment.  If Pap tests have been discontinued, risk factors (such as a new sexual partner)need to be re-assessed to determine if screening should be resumed.  Some women may need screenings more often if they are at high risk for cervical cancer.  Get mammograms done as per the advice of your caregiver. SEEK IMMEDIATE MEDICAL CARE IF:  You develop abnormal vaginal bleeding.  You have pain or swelling in your legs, shortness of breath, or chest pain.  You develop dizziness or headaches.  You have lumps or changes in your breasts or armpits.  You have slurred speech.  You develop weakness or numbness of your arms or legs.  You have pain, burning, or bleeding when urinating.  You develop abdominal pain.   This information is not intended to replace advice given to you by your health care provider. Make sure you discuss any questions you have with your health care provider.   Document Released: 12/19/2002 Document Revised: 08/06/2014 Document Reviewed: 09/23/2014 Elsevier Interactive Patient Education 2016 [employer] Menopause and Herbal Products WHAT IS MENOPAUSE? Menopause is the normal time of life when menstrual periods decrease in frequency and eventually stop completely. This process can take several years for some women. Menopause is complete when you have had an absence of menstruation for a full year since your last menstrual period.  It usually occurs between the ages of 78 and 68. It is not common for menopause to begin before the age of 38. During menopause, your body stops producing the female hormones estrogen and progesterone. Common symptoms associated with this loss of hormones (vasomotor symptoms) are:  Hot flashes.  Hot flushes.  Night sweats. Other common symptoms and complications of menopause include:  Decrease in sex drive.  Vaginal dryness and thinning of the walls of the vagina. This can make sex painful.  Dryness of the skin and development of wrinkles.  Headaches.  Tiredness.  Irritability.  Memory problems.  Weight gain.  Bladder infections.  Hair growth on the face and chest.  Inability to reproduce offspring (infertility).  Loss of density in the bones (osteoporosis) increasing your risk for breaks (fractures).  Depression.  Hardening and narrowing of the arteries (atherosclerosis). This  increases your risk of heart attack and stroke. WHAT TREATMENT OPTIONS ARE AVAILABLE? There are many treatment choices for menopause symptoms. The most common treatment is hormone replacement therapy. Many alternative therapies for menopause are emerging, including the use of herbal products. These supplements can be found in the form of herbs, teas, oils, tinctures, and pills. Common herbal supplements for menopause are made from plants that contain phytoestrogens. Phytoestrogens are compounds that occur naturally in plants and plant products. They act like estrogen in the body. Foods and herbs that contain phytoestrogens include:  Soy.  Flax seeds.  Red clover.  Ginseng. WHAT MENOPAUSE SYMPTOMS MAY BE HELPED IF I USE HERBAL PRODUCTS?  Vasomotor symptoms. These may be helped by:  Soy. Some studies show that soy may have a moderate benefit for hot flashes.  Black cohosh. There is limited evidence indicating this may be beneficial for hot flashes.  Symptoms that are related to heart and  blood vessel disease. These may be helped by soy. Studies have shown that soy can help to lower cholesterol.  Depression. This may be helped by:  St. John's wort. There is limited evidence that shows this may help mild to moderate depression.  Black cohosh. There is evidence that this may help depression and mood swings.  Osteoporosis. Soy may help to decrease bone loss that is associated with menopause and may prevent osteoporosis. Limited evidence indicates that red clover may offer some bone loss protection as well. Other herbal products that are commonly used during menopause lack enough evidence to support their use as a replacement for conventional menopause therapies. These products include evening primrose, ginseng, and red clover. WHAT ARE THE CASES WHEN HERBAL PRODUCTS SHOULD NOT BE USED DURING MENOPAUSE? Do not use herbal products during menopause without your health care provider's approval if:  You are taking medicine.  You have a preexisting liver condition. ARE THERE ANY RISKS IN MY TAKING HERBAL PRODUCTS DURING MENOPAUSE? If you choose to use herbal products to help with symptoms of menopause, keep in mind that:  Different supplements have different and unmeasured amounts of herbal ingredients.  Herbal products are not regulated the same way that medicines are.  Concentrations of herbs may vary depending on the way they are prepared. For example, the concentration may be different in a pill, tea, oil, and tincture.  Little is known about the risks of using herbal products, particularly the risks of long-term use.  Some herbal supplements can be harmful when combined with certain medicines. Most commonly reported side effects of herbal products are mild. However, if used improperly, many herbal supplements can cause serious problems. Talk to your health care provider before starting any herbal product. If problems develop, stop taking the supplement and let your health  care provider know.   This information is not intended to replace advice given to you by your health care provider. Make sure you discuss any questions you have with your health care provider.   Document Released: 09/08/2007 Document Revised: 04/12/2014 Document Reviewed: 09/04/2013 Elsevier Interactive Patient Education [employer].

## 2015-04-18 NOTE — Assessment & Plan Note (Signed)
Discussed causes. Continue patch at current dose for now. Will add cymbalta for other symptoms. Check back in in 1 month to see how she's doing on it.

## 2015-04-18 NOTE — Assessment & Plan Note (Signed)
Advised patient that I don't prescribe controlled substances on the first visit. Will obtain records and consider continuing medication next visit.

## 2015-05-16 ENCOUNTER — Encounter: Payer: Self-pay | Admitting: Family Medicine

## 2015-05-16 ENCOUNTER — Ambulatory Visit (INDEPENDENT_AMBULATORY_CARE_PROVIDER_SITE_OTHER): Payer: BC Managed Care – PPO | Admitting: Family Medicine

## 2015-05-16 VITALS — BP 119/83 | HR 94 | Temp 98.6°F | Ht 64.6 in | Wt 146.0 lb

## 2015-05-16 DIAGNOSIS — Z0000 Encounter for general adult medical examination without abnormal findings: Secondary | ICD-10-CM

## 2015-05-16 DIAGNOSIS — N951 Menopausal and female climacteric states: Secondary | ICD-10-CM

## 2015-05-16 LAB — UA/M W/RFLX CULTURE, ROUTINE
BILIRUBIN UA: NEGATIVE
Glucose, UA: NEGATIVE
Ketones, UA: NEGATIVE
Leukocytes, UA: NEGATIVE
NITRITE UA: NEGATIVE
PH UA: 8.5 — AB (ref 5.0–7.5)
Protein, UA: NEGATIVE
Specific Gravity, UA: 1.015 (ref 1.005–1.030)
UUROB: 0.2 mg/dL (ref 0.2–1.0)

## 2015-05-16 LAB — MICROSCOPIC EXAMINATION: WBC UA: NONE SEEN /HPF (ref 0–?)

## 2015-05-16 MED ORDER — DULOXETINE HCL 30 MG PO CPEP: 30.0000 mg | ORAL_CAPSULE | Freq: Every day | ORAL | Status: DC

## 2015-05-16 NOTE — Progress Notes (Signed)
BP 119/83 mmHg  Pulse 94  Temp(Src) 98.6 F (37 C)  Ht 5' 4.6" (1.641 m)  Wt 146 lb (66.225 kg)  BMI 24.59 kg/m2  SpO2 98%   Subjective:    Patient ID: Morgan Cisneros, female    DOB: 06/24/1965, 49 y.o.   MRN: [id number]  HPI: Morgan Cisneros is a 49 y.o. female presenting on 05/16/2015 for comprehensive medical examination. Current medical complaints include:  MENOPAUSAL SYMPTOMS Duration: better Symptom severity: mild Hot flashes: yes- better Night sweats: yes- occasionally Sleep disturbances: no Vaginal dryness: no Dyspareunia:no Decreased libido: no Emotional lability: no Stress incontinence: no Previous HRT/pharmacotherapy: yes Hysterectomy: yes Absolute Contraindications to Hormonal Therapy:     Undiagnosed vaginal bleeding: no    Breast cancer: no    Endometrial cancer: no    Coronary disease: no    Cerebrovascular disease: no    Venous thromboembolic disease: no  Menopausal Symptoms: yes  Depression Screen done today and results listed below:  Depression screen PHQ 2/9 05/16/2015 04/18/2015  Decreased Interest 1 1  Down, Depressed, Hopeless 1 0  PHQ - 2 Score 2 1    Past Medical History:  Past Medical History  Diagnosis Date  . Thoracic back pain     Right sided  . Ovarian cyst   . Flank pain   . Abdominal pain   . Gross hematuria   . History of kidney stones   . Renal calculus, right   . Insomnia     Surgical History:  Past Surgical History  Procedure Laterality Date  . Abdominal hysterectomy  08/2013  . Cystoscopy  11/26/2010    with stent placement  . Lithotripsy      X 4  . Tonsillectomy    . Tubal ligation    . Ureteroscopy Right 11/21/1997    with holmium laser  . Hemorrhoid surgery    . Kidney stent      Medications:  Current Outpatient Prescriptions on File Prior to Visit  Medication Sig  . polyethylene glycol (MIRALAX / GLYCOLAX) packet Take 17 g by mouth daily.   No current facility-administered medications on file  prior to visit.    Allergies:  Allergies  Allergen Reactions  . Urocit-K [Potassium Citrate] Nausea Only    Social History:  Social History   Social History  . Marital Status: Married    Spouse Name: N/A  . Number of Children: N/A  . Years of Education: N/A   Occupational History  . Not on file.   Social History Main Topics  . Smoking status: Former Smoker    Quit date: 02/04/2015  . Smokeless tobacco: Never Used  . Alcohol Use: 0.0 oz/week    0 Standard drinks or equivalent per week     Comment: occasional  . Drug Use: No  . Sexual Activity: Yes    Birth Control/ Protection: Surgical   Other Topics Concern  . Not on file   Social History Narrative   History  Smoking status  . Former Smoker  . Quit date: 02/04/2015  Smokeless tobacco  . Never Used   History  Alcohol Use  . 0.0 oz/week  . 0 Standard drinks or equivalent per week    Comment: occasional    Family History:  Family History  Problem Relation Age of Onset  . Kidney Stones Mother   . Ovarian cancer Mother   . Colon cancer Mother   . Hyperlipidemia Sister   . Hypertension Sister   . Alzheimer's   disease Maternal Grandmother   . Cancer Maternal Grandfather     Liver  . Alzheimer's disease Paternal Grandfather   . Hypertension Sister   . Hyperlipidemia Sister     Past medical history, surgical history, medications, allergies, family history and social history reviewed with patient today and changes made to appropriate areas of the chart.   Review of Systems  Constitutional: Negative.   HENT: Negative.   Eyes: Negative.   Respiratory: Negative.   Cardiovascular: Negative.   Gastrointestinal: Positive for heartburn (doing much better). Negative for nausea, vomiting, abdominal pain, diarrhea, constipation, blood in stool and melena.  Genitourinary: Negative.   Musculoskeletal: Negative.   Skin: Negative.   Neurological: Negative.   Endo/Heme/Allergies: Negative for environmental  allergies and polydipsia. Bruises/bleeds easily.  Psychiatric/Behavioral: Negative.     All other ROS negative except what is listed above and in the HPI.      Objective:    BP 119/83 mmHg  Pulse 94  Temp(Src) 98.6 F (37 C)  Ht 5' 4.6" (1.641 m)  Wt 146 lb (66.225 kg)  BMI 24.59 kg/m2  SpO2 98%  Wt Readings from Last 3 Encounters:  05/16/15 146 lb (66.225 kg)  04/18/15 144 lb (65.318 kg)  01/22/15 147 lb 3.2 oz (66.769 kg)    Physical Exam  Constitutional: She is oriented to person, place, and time. She appears well-developed and well-nourished. No distress.  HENT:  Head: Normocephalic and atraumatic.  Right Ear: Hearing, tympanic membrane, external ear and ear canal normal.  Left Ear: Hearing, tympanic membrane, external ear and ear canal normal.  Nose: Nose normal.  Mouth/Throat: Uvula is midline, oropharynx is clear and moist and mucous membranes are normal. No oropharyngeal exudate.  Eyes: Conjunctivae, EOM and lids are normal. Pupils are equal, round, and reactive to light. Right eye exhibits no discharge. Left eye exhibits no discharge. No scleral icterus.  Neck: Normal range of motion. Neck supple. No JVD present. No tracheal deviation present. No thyromegaly present.  Cardiovascular: Normal rate, regular rhythm, normal heart sounds and intact distal pulses.  Exam reveals no gallop and no friction rub.   No murmur heard. Pulmonary/Chest: Effort normal and breath sounds normal. No stridor. No respiratory distress. She has no wheezes. She has no rales. She exhibits no tenderness. Right breast exhibits no inverted nipple, no mass, no nipple discharge, no skin change and no tenderness. Left breast exhibits no inverted nipple, no mass, no nipple discharge, no skin change and no tenderness. Breasts are symmetrical.  Abdominal: Soft. Bowel sounds are normal. She exhibits no distension and no mass. There is no tenderness. There is no rebound and no guarding.  Genitourinary:   Deferred with shared decision making  Musculoskeletal: Normal range of motion. She exhibits no edema or tenderness.  Lymphadenopathy:    She has no cervical adenopathy.  Neurological: She is alert and oriented to person, place, and time. She has normal reflexes. She displays normal reflexes. No cranial nerve deficit. She exhibits normal muscle tone. Coordination normal.  Skin: Skin is warm and intact. No rash noted. She is not diaphoretic. No erythema. No pallor.  Psychiatric: She has a normal mood and affect. Her speech is normal and behavior is normal. Judgment and thought content normal. Cognition and memory are normal.  Nursing note and vitals reviewed.   Results for orders placed or performed in visit on 01/15/15  CBC w/Diff  Result Value Ref Range   WBC 5.9 3.4 - 10.8 x10E3/uL   RBC 4.12 3.77 -  5.28 x10E6/uL   Hemoglobin 11.7 11.1 - 15.9 g/dL   Hematocrit 35.5 34.0 - 46.6 %   MCV 86 79 - 97 fL   MCH 28.4 26.6 - 33.0 pg   MCHC 33.0 31.5 - 35.7 g/dL   RDW 14.7 12.3 - 15.4 %   Platelets 255 150 - 379 x10E3/uL   Neutrophils 56 %   Lymphs 35 %   Monocytes 7 %   Eos 2 %   Basos 0 %   Neutrophils Absolute 3.3 1.4 - 7.0 x10E3/uL   Lymphocytes Absolute 2.1 0.7 - 3.1 x10E3/uL   Monocytes Absolute 0.4 0.1 - 0.9 x10E3/uL   EOS (ABSOLUTE) 0.1 0.0 - 0.4 x10E3/uL   Basophils Absolute 0.0 0.0 - 0.2 x10E3/uL   Immature Granulocytes 0 %   Immature Grans (Abs) 0.0 0.0 - 0.1 x10E3/uL  Sed Rate (ESR)  Result Value Ref Range   Sed Rate 4 0 - 32 mm/hr  Comp Met (CMET)  Result Value Ref Range   Glucose 84 65 - 99 mg/dL   BUN 6 6 - 24 mg/dL   Creatinine, Ser 0.36 (L) 0.57 - 1.00 mg/dL   GFR calc non Af Amer 129 >59 mL/min/1.73   GFR calc Af Amer 149 >59 mL/min/1.73   BUN/Creatinine Ratio 17 9 - 23   Sodium 139 134 - 144 mmol/L   Potassium 3.9 3.5 - 5.2 mmol/L   Chloride 100 97 - 108 mmol/L   CO2 19 18 - 29 mmol/L   Calcium 8.6 (L) 8.7 - 10.2 mg/dL   Total Protein 6.5 6.0 - 8.5 g/dL    Albumin 3.8 3.5 - 5.5 g/dL   Globulin, Total 2.7 1.5 - 4.5 g/dL   Albumin/Globulin Ratio 1.4 1.1 - 2.5   Bilirubin Total 0.4 0.0 - 1.2 mg/dL   Alkaline Phosphatase 100 39 - 117 IU/L   AST 33 0 - 40 IU/L   ALT 33 (H) 0 - 32 IU/L      Assessment & Plan:   Problem List Items Addressed This Visit      Other   Menopausal symptoms    Other Visit Diagnoses    Routine general medical examination at a health care facility    -  Primary    Up to date on vaccines. Up to date on mammogram and colonoscopy. Screening labs checked today. Pap N/A. Work on diet and exercise.    Relevant Orders    CBC with Differential/Platelet    Comprehensive metabolic panel    Lipid Panel w/o Chol/HDL Ratio    TSH    UA/M w/rflx Culture, Routine        Follow up plan: Return in about 2 months (around 07/14/2015) for Follow up menopausal symptoms.   LABORATORY TESTING:  - Pap smear: not applicable  IMMUNIZATIONS:   - Tdap: Tetanus vaccination status reviewed: last tetanus booster within 10 years. - Influenza: Up to date - Pneumovax: Up to date  SCREENING: -Mammogram: Done elsewhere  -Colonoscopy: had polyps in 2015- OK for 5 years  PATIENT COUNSELING:   Advised to take 1 mg of folate supplement per day if capable of pregnancy.   Sexuality: Discussed sexually transmitted diseases, partner selection, use of condoms, avoidance of unintended pregnancy  and contraceptive alternatives.   Advised to avoid cigarette smoking.  I discussed with the patient that most people either abstain from alcohol or drink within safe limits (<=14/week and <=4 drinks/occasion for males, <=7/weeks and <= 3 drinks/occasion for females) and that the   risk for alcohol disorders and other health effects rises proportionally with the number of drinks per week and how often a drinker exceeds daily limits.  Discussed cessation/primary prevention of drug use and availability of treatment for abuse.   Diet: Encouraged to  adjust caloric intake to maintain  or achieve ideal body weight, to reduce intake of dietary saturated fat and total fat, to limit sodium intake by avoiding high sodium foods and not adding table salt, and to maintain adequate dietary potassium and calcium preferably from fresh fruits, vegetables, and low-fat dairy products.    stressed the importance of regular exercise  Injury prevention: Discussed safety belts, safety helmets, smoke detector, smoking near bedding or upholstery.   Dental health: Discussed importance of regular tooth brushing, flossing, and dental visits.    NEXT PREVENTATIVE PHYSICAL DUE IN 1 YEAR. Return in about 2 months (around 07/14/2015) for Follow up menopausal symptoms.            

## 2015-05-16 NOTE — Patient Instructions (Signed)

## 2015-05-17 LAB — COMPREHENSIVE METABOLIC PANEL
A/G RATIO: 2 (ref 1.1–2.5)
ALK PHOS: 105 IU/L (ref 39–117)
ALT: 21 IU/L (ref 0–32)
AST: 23 IU/L (ref 0–40)
Albumin: 4.1 g/dL (ref 3.5–5.5)
BILIRUBIN TOTAL: 0.2 mg/dL (ref 0.0–1.2)
BUN/Creatinine Ratio: 25 — ABNORMAL HIGH (ref 9–23)
BUN: 15 mg/dL (ref 6–24)
CHLORIDE: 105 mmol/L (ref 96–106)
CO2: 23 mmol/L (ref 18–29)
Calcium: 8.9 mg/dL (ref 8.7–10.2)
Creatinine, Ser: 0.6 mg/dL (ref 0.57–1.00)
GFR calc Af Amer: 125 mL/min/{1.73_m2} (ref >59)
GFR calc non Af Amer: 108 mL/min/{1.73_m2} (ref >59)
GLOBULIN, TOTAL: 2.1 g/dL (ref 1.5–4.5)
Glucose: 83 mg/dL (ref 65–99)
POTASSIUM: 4.7 mmol/L (ref 3.5–5.2)
SODIUM: 144 mmol/L (ref 134–144)
Total Protein: 6.2 g/dL (ref 6.0–8.5)

## 2015-05-17 LAB — CBC WITH DIFFERENTIAL/PLATELET
Basophils Absolute: 0 10*3/uL (ref 0.0–0.2)
Basos: 0 %
EOS (ABSOLUTE): 0.3 10*3/uL (ref 0.0–0.4)
Eos: 5 %
HEMATOCRIT: 42.2 % (ref 34.0–46.6)
Hemoglobin: 14.2 g/dL (ref 11.1–15.9)
Immature Grans (Abs): 0 10*3/uL (ref 0.0–0.1)
Immature Granulocytes: 0 %
LYMPHS ABS: 1.8 10*3/uL (ref 0.7–3.1)
Lymphs: 35 %
MCH: 31.3 pg (ref 26.6–33.0)
MCHC: 33.6 g/dL (ref 31.5–35.7)
MCV: 93 fL (ref 79–97)
MONOS ABS: 0.3 10*3/uL (ref 0.1–0.9)
Monocytes: 6 %
Neutrophils Absolute: 2.7 10*3/uL (ref 1.4–7.0)
Neutrophils: 54 %
Platelets: 312 10*3/uL (ref 150–379)
RBC: 4.53 x10E6/uL (ref 3.77–5.28)
RDW: 14.4 % (ref 12.3–15.4)
WBC: 5 10*3/uL (ref 3.4–10.8)

## 2015-05-17 LAB — LIPID PANEL W/O CHOL/HDL RATIO
CHOLESTEROL TOTAL: 161 mg/dL (ref 100–199)
HDL: 53 mg/dL (ref >39)
LDL Calculated: 95 mg/dL (ref 0–99)
TRIGLYCERIDES: 63 mg/dL (ref 0–149)
VLDL Cholesterol Cal: 13 mg/dL (ref 5–40)

## 2015-05-17 LAB — TSH: TSH: 1.06 u[IU]/mL (ref 0.450–4.500)

## 2015-05-19 ENCOUNTER — Encounter: Payer: Self-pay | Admitting: Family Medicine

## 2015-06-02 ENCOUNTER — Telehealth: Payer: Self-pay | Admitting: Family Medicine

## 2015-06-02 NOTE — Telephone Encounter (Signed)
Needs appointment for this

## 2015-06-02 NOTE — Telephone Encounter (Signed)
Patient call stating that she would like to get back on her harmon patch that are called Minivelle 0.05mg  she changes it every3 days. The reason is hot flashes are really bad. Pharmacy is [facility] in [place].

## 2015-06-02 NOTE — Telephone Encounter (Signed)
Patient scheduled for 06/06/15 @ 3:45

## 2015-06-02 NOTE — Telephone Encounter (Signed)
Forward to provider

## 2015-06-06 ENCOUNTER — Ambulatory Visit: Payer: Self-pay | Admitting: Family Medicine

## 2015-06-26 ENCOUNTER — Encounter: Payer: Self-pay | Admitting: *Deleted

## 2015-06-26 ENCOUNTER — Ambulatory Visit
Admission: EM | Admit: 2015-06-26 | Discharge: 2015-06-26 | Disposition: A | Discharge status: Home / Self Care | Payer: BC Managed Care – PPO | Attending: Emergency Medicine | Admitting: Emergency Medicine

## 2015-06-26 DIAGNOSIS — B349 Viral infection, unspecified: Secondary | ICD-10-CM

## 2015-06-26 LAB — RAPID STREP SCREEN (MED CTR MEBANE ONLY): STREPTOCOCCUS, GROUP A SCREEN (DIRECT): NEGATIVE

## 2015-06-26 MED ORDER — BENZONATATE 100 MG PO CAPS: 100.0000 mg | ORAL_CAPSULE | Freq: Three times a day (TID) | ORAL | Status: DC | PRN

## 2015-06-26 MED ORDER — LIDOCAINE VISCOUS 2 % MT SOLN: 15.0000 mL | Freq: Three times a day (TID) | OROMUCOSAL | Status: DC | PRN

## 2015-06-26 MED ORDER — HYDROCOD POLST-CPM POLST ER 10-8 MG/5ML PO SUER: 5.0000 mL | Freq: Every evening | ORAL | Status: AC | PRN

## 2015-06-26 NOTE — ED Provider Notes (Signed)
[facility]  ____________________________________________  Time seen: Approximately 10:28 AM  I have reviewed the triage vital signs and the nursing notes.   HISTORY  Chief Complaint Sore Throat and Cough  HPI Morgan Cisneros is a 50 y.o. femalepresents with complaints of runny nose, nasal congestion, sore throat and dry cough 3 days. Patient reports that the cough is her biggest complaint. Patient reports overall still continues to eat and drink well. Denies fevers. Reports multiple sick contacts at work. Denies home sick contacts. Denies pain at this time. Reports over-the-counter Mucinex has helped with symptoms.  Denies chest pain, shortness of breath, chest pain with deep breath, dizziness, weakness, hearing changes, abdominal pain, dysuria, neck pain, back pain, extremity pain or extremity swelling. Denies recent sickness.  PCP: [facility]  No LMP recorded. Patient has had a hysterectomy.   Past Medical History  Diagnosis Date  . Thoracic back pain     Right sided  . Ovarian cyst   . Flank pain   . Abdominal pain   . Gross hematuria   . History of kidney stones   . Renal calculus, right   . Insomnia     Patient Active Problem List   Diagnosis Date Noted  . GERD (gastroesophageal reflux disease) 04/18/2015  . Menopausal symptoms 04/18/2015  . Renal calculus, right   . Insomnia   . Family history of colon cancer in mother 01/15/2015  . Family history of ovarian cancer 01/15/2015  . Tobacco user 01/15/2015  . Lymphadenopathy, axillary 01/15/2015  . Calculus of kidney 12/13/2014  . LBP (low back pain) 12/13/2014  . Female genuine stress incontinence 12/13/2014    Past Surgical History  Procedure Laterality Date  . Abdominal hysterectomy  08/2013  . Cystoscopy  11/26/2010    with stent placement  . Lithotripsy      X 4  . Tonsillectomy    . Tubal ligation    . Ureteroscopy Right 11/21/1997    with holmium laser  . Hemorrhoid surgery     . Kidney stent      Current Outpatient Rx  Name  Route  Sig  Dispense  Refill  . DULoxetine (CYMBALTA) 30 MG capsule   Oral   Take 1 capsule (30 mg total) by mouth daily.   30 capsule   1   . polyethylene glycol (MIRALAX / GLYCOLAX) packet   Oral   Take 17 g by mouth daily.           Allergies Urocit-k  Family History  Problem Relation Age of Onset  . Kidney Stones Mother   . Ovarian cancer Mother   . Colon cancer Mother   . Hyperlipidemia Sister   . Hypertension Sister   . Alzheimer's disease Maternal Grandmother   . Cancer Maternal Grandfather     Liver  . Alzheimer's disease Paternal Grandfather   . Hypertension Sister   . Hyperlipidemia Sister     Social History Social History  Substance Use Topics  . Smoking status: Former Smoker    Quit date: 02/04/2015  . Smokeless tobacco: Never Used  . Alcohol Use: 0.0 oz/week    0 Standard drinks or equivalent per week     Comment: occasional    Review of Systems Constitutional: No fever/chills Eyes: No visual changes. ENT: Positive runny nose, nasal congestion, sore throat and cough. Cardiovascular: Denies chest pain. Respiratory: Denies shortness of breath. Gastrointestinal: No abdominal pain.  No nausea, no vomiting.  No diarrhea.  No constipation. Genitourinary:  Negative for dysuria. Musculoskeletal: Negative for back pain. Skin: Negative for rash. Neurological: Negative for headaches, focal weakness or numbness.  10-point ROS otherwise negative.  ____________________________________________   PHYSICAL EXAM:  VITAL SIGNS: ED Triage Vitals  Enc Vitals Group     BP 06/26/15 1014 134/77 mmHg     Pulse Rate 06/26/15 1014 90     Resp 06/26/15 1014 18     Temp 06/26/15 1014 98.2 F (36.8 C)     Temp Source 06/26/15 1014 Oral     SpO2 06/26/15 1014 100 %     Weight 06/26/15 1014 145 lb (65.772 kg)     Height 06/26/15 1014 5\' 7"  (1.702 m)     Head Cir --      Peak Flow --      Pain Score  06/26/15 1016 0     Pain Loc --      Pain Edu? --      Excl. in [place]? --   Constitutional: Alert and oriented. Well appearing and in no acute distress. Eyes: Conjunctivae are normal. PERRL. EOMI. Head: Atraumatic. No sinus tenderness to palpation. No swelling. No erythema.  Ears: no erythema, normal TMs bilaterally.   Nose:mild nasal congestion with clear rhinorrhea  Mouth/Throat: Mucous membranes are moist. Mild pharyngeal erythema. No tonsillar swelling or exudate.  Neck: No stridor.  No cervical spine tenderness to palpation. Hematological/Lymphatic/Immunilogical: No cervical lymphadenopathy. Cardiovascular: Normal rate, regular rhythm. Grossly normal heart sounds.  Good peripheral circulation. Respiratory: Normal respiratory effort.  No retractions. Lungs CTAB.No wheezes, rales or rhonchi. Good air movement.  Gastrointestinal: Soft and nontender. Normal Bowel sounds. No CVA tenderness. Musculoskeletal: No lower or upper extremity tenderness nor edema. No cervical, thoracic or lumbar tenderness to palpation. Neurologic:  Normal speech and language. No gross focal neurologic deficits are appreciated. No gait instability. Skin:  Skin is warm, dry and intact. No rash noted. Psychiatric: Mood and affect are normal. Speech and behavior are normal.  ____________________________________________   LABS (all labs ordered are listed, but only abnormal results are displayed)  Labs Reviewed  RAPID STREP SCREEN (NOT AT [facility])  CULTURE, GROUP A STREP [facility])     INITIAL IMPRESSION / ASSESSMENT AND PLAN / ED COURSE  Pertinent labs & imaging results that were available during my care of the patient were reviewed by me and considered in my medical decision making (see chart for details).  Very well-appearing patient. No acute distress. Presents for the complaints of runny nose, nasal congestion, sore throat and cough. Patient reports continues to eat and drink well. Reports multiple sick contacts  at work. Denies known sick contacts. Lungs clear throughout. Abdomen soft and nontender. Suspect viral illness. Strep negative, will culture. Discussed patient will treat supportively and symptomatically. Will treat with oral Tessalon Perles during the day as needed, when necessary Viscous Lidocaine gargles, when necessary Tussionex at night.Discussed indication, risks and benefits of medications with patient. Encourage rest, fluids, over-the-counter Tylenol or ibuprofen for fever as needed.  Discussed follow up with Primary care physician this week. Discussed follow up and return parameters including no resolution or any worsening concerns. Patient verbalized understanding and agreed to plan.   ____________________________________________   FINAL CLINICAL IMPRESSION(S) / ED DIAGNOSES  Final diagnoses:  Viral illness      Note: This dictation was prepared with Dragon dictation along with smaller phrase technology. Any transcriptional errors that result from this process are unintentional.    Marylene Land, NP 06/26/15 1336

## 2015-06-26 NOTE — Discharge Instructions (Signed)
Take medication as prescribed. Rest. Drink plenty of fluids.  ° °Follow up with your primary care physician this week as needed. Return to Urgent care for new or worsening concerns.  ° ° °Viral Infections °A viral infection can be caused by different types of viruses. Most viral infections are not serious and resolve on their own. However, some infections may cause severe symptoms and may lead to further complications. °SYMPTOMS °Viruses can frequently cause: °· Minor sore throat. °· Aches and pains. °· Headaches. °· Runny nose. °· Different types of rashes. °· Watery eyes. °· Tiredness. °· Cough. °· Loss of appetite. °· Gastrointestinal infections, resulting in nausea, vomiting, and diarrhea. °These symptoms do not respond to antibiotics because the infection is not caused by bacteria. However, you might catch a bacterial infection following the viral infection. This is sometimes called a "superinfection." Symptoms of such a bacterial infection may include: °· Worsening sore throat with pus and difficulty swallowing. °· Swollen neck glands. °· Chills and a high or persistent fever. °· Severe headache. °· Tenderness over the sinuses. °· Persistent overall ill feeling (malaise), muscle aches, and tiredness (fatigue). °· Persistent cough. °· Yellow, green, or brown mucus production with coughing. °HOME CARE INSTRUCTIONS  °· Only take over-the-counter or prescription medicines for pain, discomfort, diarrhea, or fever as directed by your caregiver. °· Drink enough water and fluids to keep your urine clear or pale yellow. Sports drinks can provide valuable electrolytes, sugars, and hydration. °· Get plenty of rest and maintain proper nutrition. Soups and broths with crackers or rice are fine. °SEEK IMMEDIATE MEDICAL CARE IF:  °· You have severe headaches, shortness of breath, chest pain, neck pain, or an unusual rash. °· You have uncontrolled vomiting, diarrhea, or you are unable to keep down fluids. °· You or your child  has an oral temperature above 102° F (38.9° C), not controlled by medicine. °· Your baby is older than 3 months with a rectal temperature of 102° F (38.9° C) or higher. °· Your baby is 3 months old or younger with a rectal temperature of 100.4° F (38° C) or higher. °MAKE SURE YOU:  °· Understand these instructions. °· Will watch your condition. °· Will get help right away if you are not doing well or get worse. °  °This information is not intended to replace advice given to you by your health care provider. Make sure you discuss any questions you have with your health care provider. °  °Document Released: 12/30/2004 Document Revised: 06/14/2011 Document Reviewed: 08/28/2014 °Elsevier Interactive Patient Education ©2016 [employer] ° °

## 2015-06-26 NOTE — ED Notes (Signed)
Patient started having symptoms of sore throat and cough 3 days ago. Symptoms having progressively worsened.

## 2015-06-28 LAB — CULTURE, GROUP A STREP (THRC)

## 2015-07-01 ENCOUNTER — Encounter: Payer: Self-pay | Admitting: Family Medicine

## 2015-07-01 ENCOUNTER — Ambulatory Visit (INDEPENDENT_AMBULATORY_CARE_PROVIDER_SITE_OTHER): Payer: BC Managed Care – PPO | Admitting: Family Medicine

## 2015-07-01 VITALS — BP 117/78 | HR 81 | Temp 97.7°F | Ht 66.0 in | Wt 146.0 lb

## 2015-07-01 DIAGNOSIS — J019 Acute sinusitis, unspecified: Secondary | ICD-10-CM | POA: Diagnosis not present

## 2015-07-01 MED ORDER — AZITHROMYCIN 250 MG PO TABS: ORAL_TABLET | ORAL | Status: DC

## 2015-07-01 NOTE — Progress Notes (Signed)
   BP 117/78 mmHg  Pulse 81  Temp(Src) 97.7 F (36.5 C)  Ht 5\' 6"  (1.676 m)  Wt 146 lb (66.225 kg)  BMI 23.58 kg/m2  SpO2 99%   Subjective:    Patient ID: Morgan Cisneros, female    DOB: 1965-07-31, 50 y.o.   MRN: [id number]  HPI: Morgan Cisneros is a 50 y.o. female  Chief Complaint  Patient presents with  . URI    x 5 days (have been to Urgent Care)   Patient with chest cold some head congestion drainage seems to maybe be getting better but still having heavy dry cough has had both Tussionex and Tessalon Perles Mucinex D seems to help the most Patient went back to work yesterday but got sent home because of coughing No having a lot of head congestion drainage also Patient with ongoing marked systemic symptoms of fatigue chills and some aches. Relevant past medical, surgical, family and social history reviewed and updated as indicated. Interim medical history since our last visit reviewed. Allergies and medications reviewed and updated.  Review of Systems  Constitutional: Positive for chills and fatigue. Negative for fever.  HENT: Positive for congestion, rhinorrhea, sinus pressure and sore throat.   Respiratory: Positive for cough. Negative for shortness of breath.   Cardiovascular: Negative.  Negative for chest pain.    Per HPI unless specifically indicated above     Objective:    BP 117/78 mmHg  Pulse 81  Temp(Src) 97.7 F (36.5 C)  Ht 5\' 6"  (1.676 m)  Wt 146 lb (66.225 kg)  BMI 23.58 kg/m2  SpO2 99%  Wt Readings from Last 3 Encounters:  07/01/15 146 lb (66.225 kg)  06/26/15 145 lb (65.772 kg)  05/16/15 146 lb (66.225 kg)    Physical Exam  Constitutional: She is oriented to person, place, and time. She appears well-developed and well-nourished. No distress.  HENT:  Head: Normocephalic and atraumatic.  Right Ear: Hearing and external ear normal.  Left Ear: Hearing and external ear normal.  Nose: Nose normal.  Mouth/Throat: Oropharyngeal exudate  present.  Eyes: Conjunctivae and lids are normal. Right eye exhibits no discharge. Left eye exhibits no discharge. No scleral icterus.  Cardiovascular: Normal rate, regular rhythm and normal heart sounds.   Pulmonary/Chest: Effort normal and breath sounds normal. No respiratory distress. She has no rales.  Musculoskeletal: Normal range of motion.  Neurological: She is alert and oriented to person, place, and time.  Skin: Skin is intact. No rash noted.  Psychiatric: She has a normal mood and affect. Her speech is normal and behavior is normal. Judgment and thought content normal. Cognition and memory are normal.        Assessment & Plan:   Problem List Items Addressed This Visit    None    Visit Diagnoses    Acute sinusitis, recurrence not specified, unspecified location    -  Primary    Discussed sinusitis care and treatment use of antibiotics patient will hold Rx to start later if getting worse use of OTC's Tylenol etc. discuss cough care and     Relevant Medications    Hydrocod Polst-Chlorphen Polst (TUSSIONEX PENNKINETIC ER PO)    azithromycin (ZITHROMAX) 250 MG tablet        Follow up plan: Return for As scheduled.

## 2015-07-03 ENCOUNTER — Other Ambulatory Visit: Payer: Self-pay | Admitting: Family Medicine

## 2015-07-03 DIAGNOSIS — Z1231 Encounter for screening mammogram for malignant neoplasm of breast: Secondary | ICD-10-CM

## 2015-07-15 ENCOUNTER — Ambulatory Visit (INDEPENDENT_AMBULATORY_CARE_PROVIDER_SITE_OTHER): Payer: BC Managed Care – PPO | Admitting: Family Medicine

## 2015-07-15 ENCOUNTER — Encounter: Payer: Self-pay | Admitting: Family Medicine

## 2015-07-15 VITALS — BP 123/83 | HR 90 | Temp 99.0°F | Wt 145.0 lb

## 2015-07-15 DIAGNOSIS — N951 Menopausal and female climacteric states: Secondary | ICD-10-CM

## 2015-07-15 MED ORDER — DULOXETINE HCL 40 MG PO CPEP: 30.0000 mg | ORAL_CAPSULE | Freq: Every day | ORAL | Status: DC

## 2015-07-15 NOTE — Assessment & Plan Note (Signed)
Will increase her cymbalta to 40mg  daily. Check in by phone in 2 weeks, check in in person in 3 months. Call with any concerns.

## 2015-07-15 NOTE — Progress Notes (Signed)
BP 123/83 mmHg  Pulse 90  Temp(Src) 99 F (37.2 C)  Wt 145 lb (65.772 kg)  SpO2 100%   Subjective:    Patient ID: Morgan Cisneros, female    DOB: [birth year], 50 y.o.   MRN: [id number]  HPI: Morgan Cisneros is a 50 y.o. female  Chief Complaint  Patient presents with  . Menopause    Patient is here to follow up on Cymbalta that was started to help her with mood swings and hot flases   MENOPAUSAL SYMPTOMS- cymbalta not helping as much any more Duration: exacerbated Symptom severity: moderate Hot flashes: yes Night sweats: yes Sleep disturbances: yes Vaginal dryness: yes Dyspareunia:no Decreased libido: yes Emotional lability: much better Stress incontinence: yes Previous HRT/pharmacotherapy: yes Hysterectomy: yes  Relevant past medical, surgical, family and social history reviewed and updated as indicated. Interim medical history since our last visit reviewed. Allergies and medications reviewed and updated.  Review of Systems  Constitutional: Positive for diaphoresis. Negative for fever, chills, activity change, appetite change, fatigue and unexpected weight change.  Respiratory: Negative.   Cardiovascular: Negative.   Endocrine: Positive for heat intolerance. Negative for cold intolerance, polydipsia, polyphagia and polyuria.    Per HPI unless specifically indicated above     Objective:    BP 123/83 mmHg  Pulse 90  Temp(Src) 99 F (37.2 C)  Wt 145 lb (65.772 kg)  SpO2 100%  Wt Readings from Last 3 Encounters:  07/15/15 145 lb (65.772 kg)  07/01/15 146 lb (66.225 kg)  06/26/15 145 lb (65.772 kg)    Physical Exam  Constitutional: She is oriented to person, place, and time. She appears well-developed and well-nourished. No distress.  HENT:  Head: Normocephalic and atraumatic.  Right Ear: Hearing normal.  Left Ear: Hearing normal.  Nose: Nose normal.  Eyes: Conjunctivae and lids are normal. Right eye exhibits no discharge. Left eye exhibits no  discharge. No scleral icterus.  Pulmonary/Chest: Effort normal. No respiratory distress.  Musculoskeletal: Normal range of motion.  Neurological: She is alert and oriented to person, place, and time.  Skin: Skin is warm, dry and intact. No rash noted. She is not diaphoretic. No erythema. No pallor.  Psychiatric: She has a normal mood and affect. Her speech is normal and behavior is normal. Judgment and thought content normal. Cognition and memory are normal.    Results for orders placed or performed during the hospital encounter of 06/26/15  Rapid strep screen  Result Value Ref Range   Streptococcus, Group A Screen (Direct) NEGATIVE NEGATIVE  Culture, group A strep  Result Value Ref Range   Specimen Description THROAT    Special Requests NONE Reflexed from [id number]    Culture NO BETA HEMOLYTIC STREPTOCOCCI ISOLATED    Report Status 06/28/2015 FINAL       Assessment & Plan:   Problem List Items Addressed This Visit      Other   Menopausal symptoms - Primary    Will increase her cymbalta to 40mg  daily. Check in by phone in 2 weeks, check in in person in 3 months. Call with any concerns.           Follow up plan: Return in about 3 months (around 10/14/2015).

## 2015-07-16 ENCOUNTER — Other Ambulatory Visit: Payer: Self-pay | Admitting: Family Medicine

## 2015-07-16 MED ORDER — DULOXETINE HCL 40 MG PO CPEP: 40.0000 mg | ORAL_CAPSULE | Freq: Every day | ORAL | Status: DC

## 2015-07-25 ENCOUNTER — Encounter: Payer: Self-pay | Admitting: Family Medicine

## 2015-07-25 ENCOUNTER — Ambulatory Visit
Admission: RE | Admit: 2015-07-25 | Discharge: 2015-07-25 | Disposition: A | Discharge status: Home / Self Care | Payer: BC Managed Care – PPO | Source: Ambulatory Visit | Attending: Family Medicine | Admitting: Family Medicine

## 2015-07-25 DIAGNOSIS — Z1231 Encounter for screening mammogram for malignant neoplasm of breast: Secondary | ICD-10-CM | POA: Insufficient documentation

## 2015-07-30 ENCOUNTER — Telehealth: Payer: Self-pay | Admitting: Family Medicine

## 2015-07-30 NOTE — Telephone Encounter (Signed)
-----   Message from Valerie Roys, DO sent at 07/15/2015  4:28 PM EDT ----- Call about hot flashes

## 2015-07-30 NOTE — Telephone Encounter (Signed)
Called Morgan Cisneros to check in and see how her hot flashes were doing and if she had improved on the higher dose of cymbalta. LMOM for her to call back.

## 2015-08-04 NOTE — Telephone Encounter (Signed)
Called patient. Hot flashes not any better, but hasn't gone up to the 40mg  yet, going up today. Will call her in 2 weeks and see how her hot flashes are.

## 2015-08-13 ENCOUNTER — Ambulatory Visit
Admission: EM | Admit: 2015-08-13 | Discharge: 2015-08-13 | Disposition: A | Discharge status: Home / Self Care | Payer: BC Managed Care – PPO | Attending: Family Medicine | Admitting: Family Medicine

## 2015-08-13 ENCOUNTER — Encounter: Payer: Self-pay | Admitting: Emergency Medicine

## 2015-08-13 DIAGNOSIS — J0110 Acute frontal sinusitis, unspecified: Secondary | ICD-10-CM | POA: Diagnosis not present

## 2015-08-13 DIAGNOSIS — H6593 Unspecified nonsuppurative otitis media, bilateral: Secondary | ICD-10-CM | POA: Diagnosis not present

## 2015-08-13 MED ORDER — FLUTICASONE PROPIONATE 50 MCG/ACT NA SUSP: 1.0000 | Freq: Two times a day (BID) | NASAL | Status: DC

## 2015-08-13 MED ORDER — ACETAMINOPHEN 500 MG PO TABS: 1000.0000 mg | ORAL_TABLET | Freq: Four times a day (QID) | ORAL | Status: AC | PRN

## 2015-08-13 MED ORDER — AMOXICILLIN-POT CLAVULANATE 875-125 MG PO TABS: 1.0000 | ORAL_TABLET | Freq: Two times a day (BID) | ORAL | Status: DC

## 2015-08-13 MED ORDER — SALINE SPRAY 0.65 % NA SOLN: 2.0000 | NASAL | Status: DC

## 2015-08-13 MED ORDER — OXYMETAZOLINE HCL 0.05 % NA SOLN: 1.0000 | Freq: Two times a day (BID) | NASAL | Status: AC

## 2015-08-13 NOTE — ED Notes (Signed)
Patient c/o sinus pain and congestion and HAs that started Sunday.  Patient denies fevers.

## 2015-08-13 NOTE — ED Provider Notes (Signed)
CSN: [id number]     Arrival date & time 08/13/15  1306 History   First MD Initiated Contact with Patient 08/13/15 1337     Chief Complaint  Patient presents with  . Facial Pain  . Headache   (Consider location/radiation/quality/duration/timing/severity/associated sxs/prior Treatment) HPI Comments: Married caucasian female here for evaluation sinus pressure, ear pain, headache, nasal congestion, upper teeth pain started Sunday at the beach (7 May) has had some seasonal allergies this spring; family members with strep throat has taken OTC mucinex and antihistamines for allergies/runny nose without any improvement in symptoms  Last sinus infection years ago  Patient is a 50 y.o. female presenting with headaches. The history is provided by the patient.  Headache Pain location:  Frontal Quality:  Stabbing Radiates to:  Face Severity currently:  5/10 Severity at highest:  5/10 Onset quality:  Sudden Duration:  3 days Timing:  Constant Progression:  Worsening Chronicity:  New Similar to prior headaches: no   Context: not activity, not exposure to bright light, not caffeine, not coughing, not defecating, not eating, not stress, not exposure to cold air, not intercourse, not loud noise and not straining   Relieved by:  Nothing Worsened by:  Activity Associated symptoms: congestion, drainage, ear pain, facial pain, sinus pressure, sore throat and URI   Associated symptoms: no abdominal pain, no back pain, no blurred vision, no cough, no diarrhea, no dizziness, no eye pain, no fatigue, no fever, no focal weakness, no hearing loss, no loss of balance, no myalgias, no nausea, no near-syncope, no neck pain, no neck stiffness, no numbness, no paresthesias, no photophobia, no seizures, no swollen glands, no syncope, no tingling, no visual change, no vomiting and no weakness   Congestion:    Location:  Nasal   Interferes with sleep: no     Interferes with eating/drinking: no   Ear pain:    Location:   Bilateral   Severity:  Mild   Onset quality:  Sudden   Duration:  3 days   Timing:  Constant   Progression:  Unchanged   Chronicity:  New Sore throat:    Severity:  Mild   Onset quality:  Sudden   Duration:  3 days   Timing:  Constant   Progression:  Unchanged Risk factors: no anger, no family hx of SAH, does not have insomnia and lifestyle not sedentary     Past Medical History  Diagnosis Date  . Thoracic back pain     Right sided  . Ovarian cyst   . Flank pain   . Abdominal pain   . Gross hematuria   . History of kidney stones   . Renal calculus, right   . Insomnia    Past Surgical History  Procedure Laterality Date  . Abdominal hysterectomy  08/2013  . Cystoscopy  11/26/2010    with stent placement  . Lithotripsy      X 4  . Tonsillectomy    . Tubal ligation    . Ureteroscopy Right 11/21/1997    with holmium laser  . Hemorrhoid surgery    . Kidney stent     Family History  Problem Relation Age of Onset  . Kidney Stones Mother   . Ovarian cancer Mother   . Colon cancer Mother   . Hyperlipidemia Sister   . Hypertension Sister   . Alzheimer's disease Maternal Grandmother   . Cancer Maternal Grandfather     Liver  . Alzheimer's disease Paternal Grandfather   . Hypertension Sister   .  Hyperlipidemia Sister    Social History  Substance Use Topics  . Smoking status: Former Smoker    Quit date: 02/04/2015  . Smokeless tobacco: Never Used  . Alcohol Use: 0.0 oz/week    0 Standard drinks or equivalent per week     Comment: occasional   OB History    No data available     Review of Systems  Constitutional: Positive for chills. Negative for fever, diaphoresis, activity change, appetite change, fatigue and unexpected weight change.  HENT: Positive for congestion, ear pain, postnasal drip, sinus pressure and sore throat. Negative for dental problem, drooling, ear discharge, facial swelling, hearing loss, mouth sores, nosebleeds, rhinorrhea, sneezing,  tinnitus, trouble swallowing and voice change.   Eyes: Negative for blurred vision, photophobia, pain, discharge, redness, itching and visual disturbance.  Respiratory: Negative for cough, choking, chest tightness, shortness of breath, wheezing and stridor.   Cardiovascular: Negative for chest pain, palpitations, leg swelling, syncope and near-syncope.  Gastrointestinal: Negative for nausea, vomiting, abdominal pain, diarrhea, constipation, blood in stool and abdominal distention.  Endocrine: Negative for cold intolerance and heat intolerance.  Genitourinary: Negative for dysuria, hematuria and difficulty urinating.  Musculoskeletal: Negative for myalgias, back pain, joint swelling, arthralgias, gait problem, neck pain and neck stiffness.  Skin: Negative for color change, pallor, rash and wound.  Allergic/Immunologic: Positive for environmental allergies. Negative for food allergies.  Neurological: Positive for headaches. Negative for dizziness, tremors, focal weakness, seizures, syncope, facial asymmetry, speech difficulty, weakness, light-headedness, numbness, paresthesias and loss of balance.  Hematological: Negative for adenopathy. Does not bruise/bleed easily.  Psychiatric/Behavioral: Negative for behavioral problems, confusion, sleep disturbance and agitation.    Allergies  Urocit-k  Home Medications   Prior to Admission medications   Medication Sig Start Date End Date Taking? Authorizing Provider  DULoxetine (CYMBALTA) 20 MG capsule Take 40 mg by mouth daily.   Yes Historical Provider, MD  acetaminophen (TYLENOL) 500 MG tablet Take 2 tablets (1,000 mg total) by mouth every 6 (six) hours as needed for mild pain, moderate pain or headache. 08/13/15 08/16/15  Olen Cordial, NP  amoxicillin-clavulanate (AUGMENTIN) 875-125 MG tablet Take 1 tablet by mouth every 12 (twelve) hours. 08/13/15   Olen Cordial, NP  fluticasone (FLONASE) 50 MCG/ACT nasal spray Place 1 spray into both  nostrils 2 (two) times daily. 08/13/15   Olen Cordial, NP  oxymetazoline (AFRIN) 0.05 % nasal spray Place 1 spray into both nostrils 2 (two) times daily. Max 3 days use 08/13/15 08/15/15  Olen Cordial, NP  sodium chloride (OCEAN) 0.65 % SOLN nasal spray Place 2 sprays into both nostrils every 2 (two) hours while awake. 08/13/15 09/03/15  Olen Cordial, NP   Meds Ordered and Administered this Visit  Medications - No data to display  BP 124/83 mmHg  Pulse 93  Temp(Src) 98.6 F (37 C) (Tympanic)  Resp 16  Ht 5\' 6"  (1.676 m)  Wt 145 lb (65.772 kg)  BMI 23.41 kg/m2  SpO2 98% No data found.   Physical Exam  Constitutional: She is oriented to person, place, and time. Vital signs are normal. She appears well-developed and well-nourished. She is active and cooperative.  Non-toxic appearance. She does not have a sickly appearance. She appears ill. No distress.  HENT:  Head: Normocephalic and atraumatic.  Right Ear: Hearing, external ear and ear canal normal. A middle ear effusion is present.  Left Ear: Hearing, external ear and ear canal normal. A middle ear effusion is present.  Nose:  Mucosal edema and rhinorrhea present. No nose lacerations, sinus tenderness, nasal deformity, septal deviation or nasal septal hematoma. No epistaxis.  No foreign bodies. Right sinus exhibits maxillary sinus tenderness and frontal sinus tenderness. Left sinus exhibits maxillary sinus tenderness and frontal sinus tenderness.  Mouth/Throat: Uvula is midline and mucous membranes are normal. Mucous membranes are not pale, not dry and not cyanotic. She does not have dentures. No oral lesions. No trismus in the jaw. Normal dentition. No dental abscesses, uvula swelling, lacerations or dental caries. Posterior oropharyngeal edema and posterior oropharyngeal erythema present. No oropharyngeal exudate or tonsillar abscesses.  Bilateral TMs with air fluid level clear; bilateral nasal turbinates edema/erythema clear  discharge; bilateral allergic shiners; cobblestoning posterior pharynx; frontal greater than maxillary pain with palpation  Eyes: Conjunctivae, EOM and lids are normal. Pupils are equal, round, and reactive to light. Right eye exhibits no chemosis, no discharge, no exudate and no hordeolum. No foreign body present in the right eye. Left eye exhibits no chemosis, no discharge, no exudate and no hordeolum. No foreign body present in the left eye. Right conjunctiva is not injected. Right conjunctiva has no hemorrhage. Left conjunctiva is not injected. Left conjunctiva has no hemorrhage. No scleral icterus. Right eye exhibits normal extraocular motion and no nystagmus. Left eye exhibits normal extraocular motion and no nystagmus. Right pupil is round and reactive. Left pupil is round and reactive. Pupils are equal.  Neck: Trachea normal and normal range of motion. Neck supple. No tracheal tenderness, no spinous process tenderness and no muscular tenderness present. No rigidity. No tracheal deviation, no edema, no erythema and normal range of motion present. No thyroid mass and no thyromegaly present.  Cardiovascular: Normal rate, regular rhythm, S1 normal, S2 normal, normal heart sounds and intact distal pulses.  PMI is not displaced.  Exam reveals no gallop and no friction rub.   No murmur heard. Pulmonary/Chest: Effort normal and breath sounds normal. No accessory muscle usage or stridor. No respiratory distress. She has no decreased breath sounds. She has no wheezes. She has no rhonchi. She has no rales. She exhibits no tenderness.  Abdominal: Soft. She exhibits no distension.  Musculoskeletal: Normal range of motion. She exhibits no edema or tenderness.       Right shoulder: Normal.       Left shoulder: Normal.       Right hip: Normal.       Left hip: Normal.       Right knee: Normal.       Left knee: Normal.       Cervical back: Normal.       Right hand: Normal.       Left hand: Normal.   Lymphadenopathy:       Head (right side): No submental, no submandibular, no tonsillar, no preauricular, no posterior auricular and no occipital adenopathy present.       Head (left side): No submental, no submandibular, no tonsillar, no preauricular, no posterior auricular and no occipital adenopathy present.    She has no cervical adenopathy.       Right cervical: No superficial cervical, no deep cervical and no posterior cervical adenopathy present.      Left cervical: No superficial cervical, no deep cervical and no posterior cervical adenopathy present.  Neurological: She is alert and oriented to person, place, and time. She has normal strength. She is not disoriented. She displays no atrophy and no tremor. No cranial nerve deficit or sensory deficit. She exhibits normal muscle tone.  She displays no seizure activity. Coordination and gait normal. GCS eye subscore is 4. GCS verbal subscore is 5. GCS motor subscore is 6.  Skin: Skin is warm, dry and intact. No abrasion, no bruising, no burn, no ecchymosis, no laceration, no lesion, no petechiae and no rash noted. She is not diaphoretic. No cyanosis or erythema. No pallor. Nails show no clubbing.  Psychiatric: She has a normal mood and affect. Her speech is normal and behavior is normal. Judgment and thought content normal. Cognition and memory are normal.  Nursing note and vitals reviewed.   ED Course  Procedures (including critical care time)  Labs Review Labs Reviewed - No data to display  Imaging Review No results found.    MDM   1. Acute frontal sinusitis, recurrence not specified   2. Otitis media with effusion, bilateral     start flonase 1 spray each nostril BID, saline 2 sprays each nostril q2h prn congestion.  Afrin 1 spray each nostril BID x 3 days maximum prn congestion use after saline.  Saline first to clear nasal passages than instill flonase/afrin.   If no improvement with 48 hours of saline and flonase use start  augmentin 875mg  po BID x 10 days.  Rx given.  Tylenol 1000mg  po QID prn pain avoid advil/aleve/motrin/naproxen as interacts with cymbalta.   No evidence of systemic bacterial infection, non toxic and well hydrated.  I do not see where any further testing or imaging is necessary at this time.   I will suggest supportive care, rest, good hygiene and encourage the patient to take adequate fluids.  The patient is to return to clinic or EMERGENCY ROOM if symptoms worsen or change significantly.  Exitcare handout on sinusitis given to patient.  Patient verbalized agreement and understanding of treatment plan and had no further questions at this time.   P2:  Hand washing and cover cough  Patient may use normal saline nasal spray as needed.  Continue zyrtec 10mg  po daily.  Start flonase 1 spray each nostril BID after clearing nasal passages with nasal saline 2 sprays each nostril q2h prn congestion.  Avoid triggers if possible.  Shower prior to bedtime if exposed to triggers.  If allergic dust/dust mites recommend mattress/pillow covers/encasements; washing linens, vacuuming, sweeping, dusting weekly.  Call or return to clinic as needed if these symptoms worsen or fail to improve as anticipated.   Exitcare handout on allergic rhinitis given to patient.  Patient verbalized understanding of instructions, agreed with plan of care and had no further questions at this time.  P2:  Avoidance and hand washing.  Supportive treatment.   No evidence of invasive bacterial infection, non toxic and well hydrated.  This is most likely self limiting viral infection.  I do not see where any further testing or imaging is necessary at this time.   I will suggest supportive care, rest, good hygiene and encourage the patient to take adequate fluids.  The patient is to return to clinic or EMERGENCY ROOM if symptoms worsen or change significantly e.g. ear pain, fever, purulent discharge from ears or bleeding.  Exitcare handout on otitis  media with effusion given to patient.  Patient verbalized agreement and understanding of treatment plan.    augmentin 875mg  po BID x 10 days will cover for strep also since spouse + for strep.  Tylenol 1000mg  po QID prn pain.  Avoid motrin/naproxen/advil/aleve as can interact with cymbalta. Usually no specific medical treatment is needed if a virus is causing the  sore throat.  The throat most often gets better on its own within 5 to 7 days.  Antibiotic medicine does not cure viral pharyngitis.   For acute pharyngitis caused by bacteria, your healthcare provider will prescribe an antibiotic.  Marland Kitchen Do not smoke.  Marland Kitchen Avoid secondhand smoke and other air pollutants.  . Use a cool mist humidifier to add moisture to the air.  . Get plenty of rest.  . You may want to rest your throat by talking less and eating a diet that is mostly liquid or soft for a day or two.   Marland Kitchen Nonprescription throat lozenges and mouthwashes should help relieve the soreness.   . Gargling with warm saltwater and drinking warm liquids may help.  (You can make a saltwater solution by adding 1/4 teaspoon of salt to 8 ounces, or 240 mL, of warm water.)  . A nonprescription pain reliever such as aspirin, acetaminophen, or ibuprofen may ease general aches and pains.   FOLLOW UP with clinic provider if no improvements in the next 7-10 days.  Patient verbalized understanding of instructions and agreed with plan of care. P2:  Hand washing and diet.       Olen Cordial, NP 08/13/15 1402

## 2015-08-13 NOTE — Discharge Instructions (Signed)
Otitis Media With Effusion Otitis media with effusion is the presence of fluid in the middle ear. This is a common problem in children, which often follows ear infections. It may be present for weeks or longer after the infection. Unlike an acute ear infection, otitis media with effusion refers only to fluid behind the ear drum and not infection. Children with repeated ear and sinus infections and allergy problems are the most likely to get otitis media with effusion. CAUSES  The most frequent cause of the fluid buildup is dysfunction of the eustachian tubes. These are the tubes that drain fluid in the ears to the back of the nose (nasopharynx). SYMPTOMS   The main symptom of this condition is hearing loss. As a result, you or your child may:  Listen to the TV at a loud volume.  Not respond to questions.  Ask "what" often when spoken to.  Mistake or confuse one sound or word for another.  There may be a sensation of fullness or pressure but usually not pain. DIAGNOSIS   Your health care provider will diagnose this condition by examining you or your child's ears.  Your health care provider may test the pressure in you or your child's ear with a tympanometer.  A hearing test may be conducted if the problem persists. TREATMENT   Treatment depends on the duration and the effects of the effusion.  Antibiotics, decongestants, nose drops, and cortisone-type drugs (tablets or nasal spray) may not be helpful.  Children with persistent ear effusions may have delayed language or behavioral problems. Children at risk for developmental delays in hearing, learning, and speech may require referral to a specialist earlier than children not at risk.  You or your child's health care provider may suggest a referral to an ear, nose, and throat surgeon for treatment. The following may help restore normal hearing:  Drainage of fluid.  Placement of ear tubes (tympanostomy tubes).  Removal of adenoids  (adenoidectomy). HOME CARE INSTRUCTIONS   Avoid secondhand smoke.  Infants who are breastfed are less likely to have this condition.  Avoid feeding infants while they are lying flat.  Avoid known environmental allergens.  Avoid people who are sick. SEEK MEDICAL CARE IF:   Hearing is not better in 3 months.  Hearing is worse.  Ear pain.  Drainage from the ear.  Dizziness. MAKE SURE YOU:   Understand these instructions.  Will watch your condition.  Will get help right away if you are not doing well or get worse.   This information is not intended to replace advice given to you by your health care provider. Make sure you discuss any questions you have with your health care provider.   Document Released: 04/29/2004 Document Revised: 04/12/2014 Document Reviewed: 10/17/2012 Elsevier Interactive Patient Education 2016 [employer] Sinusitis, Adult Sinusitis is redness, soreness, and inflammation of the paranasal sinuses. Paranasal sinuses are air pockets within the bones of your face. They are located beneath your eyes, in the middle of your forehead, and above your eyes. In healthy paranasal sinuses, mucus is able to drain out, and air is able to circulate through them by way of your nose. However, when your paranasal sinuses are inflamed, mucus and air can become trapped. This can allow bacteria and other germs to grow and cause infection. Sinusitis can develop quickly and last only a short time (acute) or continue over a long period (chronic). Sinusitis that lasts for more than 12 weeks is considered chronic. CAUSES Causes of sinusitis   include:  Allergies.  Structural abnormalities, such as displacement of the cartilage that separates your nostrils (deviated septum), which can decrease the air flow through your nose and sinuses and affect sinus drainage.  Functional abnormalities, such as when the small hairs (cilia) that line your sinuses and help remove mucus do not work  properly or are not present. SIGNS AND SYMPTOMS Symptoms of acute and chronic sinusitis are the same. The primary symptoms are pain and pressure around the affected sinuses. Other symptoms include:  Upper toothache.  Earache.  Headache.  Bad breath.  Decreased sense of smell and taste.  A cough, which worsens when you are lying flat.  Fatigue.  Fever.  Thick drainage from your nose, which often is green and may contain pus (purulent).  Swelling and warmth over the affected sinuses. DIAGNOSIS Your health care provider will perform a physical exam. During your exam, your health care provider may perform any of the following to help determine if you have acute sinusitis or chronic sinusitis:  Look in your nose for signs of abnormal growths in your nostrils (nasal polyps).  Tap over the affected sinus to check for signs of infection.  View the inside of your sinuses using an imaging device that has a light attached (endoscope). If your health care provider suspects that you have chronic sinusitis, one or more of the following tests may be recommended:  Allergy tests.  Nasal culture. A sample of mucus is taken from your nose, sent to a lab, and screened for bacteria.  Nasal cytology. A sample of mucus is taken from your nose and examined by your health care provider to determine if your sinusitis is related to an allergy. TREATMENT Most cases of acute sinusitis are related to a viral infection and will resolve on their own within 10 days. Sometimes, medicines are prescribed to help relieve symptoms of both acute and chronic sinusitis. These may include pain medicines, decongestants, nasal steroid sprays, or saline sprays. However, for sinusitis related to a bacterial infection, your health care provider will prescribe antibiotic medicines. These are medicines that will help kill the bacteria causing the infection. Rarely, sinusitis is caused by a fungal infection. In these cases,  your health care provider will prescribe antifungal medicine. For some cases of chronic sinusitis, surgery is needed. Generally, these are cases in which sinusitis recurs more than 3 times per year, despite other treatments. HOME CARE INSTRUCTIONS  Drink plenty of water. Water helps thin the mucus so your sinuses can drain more easily.  Use a humidifier.  Inhale steam 3-4 times a day (for example, sit in the bathroom with the shower running).  Apply a warm, moist washcloth to your face 3-4 times a day, or as directed by your health care provider.  Use saline nasal sprays to help moisten and clean your sinuses.  Take medicines only as directed by your health care provider.  If you were prescribed either an antibiotic or antifungal medicine, finish it all even if you start to feel better. SEEK IMMEDIATE MEDICAL CARE IF:  You have increasing pain or severe headaches.  You have nausea, vomiting, or drowsiness.  You have swelling around your face.  You have vision problems.  You have a stiff neck.  You have difficulty breathing.   This information is not intended to replace advice given to you by your health care provider. Make sure you discuss any questions you have with your health care provider.   Document Released: 03/22/2005 Document Revised: 04/12/2014   Document Reviewed: 04/06/2011 Elsevier Interactive Patient Education 2016 Elsevier Inc.  

## 2015-08-15 ENCOUNTER — Ambulatory Visit: Payer: BC Managed Care – PPO | Admitting: Unknown Physician Specialty

## 2015-08-20 ENCOUNTER — Ambulatory Visit (INDEPENDENT_AMBULATORY_CARE_PROVIDER_SITE_OTHER): Payer: BC Managed Care – PPO | Admitting: Family Medicine

## 2015-08-20 ENCOUNTER — Encounter: Payer: Self-pay | Admitting: Family Medicine

## 2015-08-20 VITALS — BP 123/83 | HR 91 | Temp 98.3°F | Wt 151.0 lb

## 2015-08-20 DIAGNOSIS — J019 Acute sinusitis, unspecified: Secondary | ICD-10-CM

## 2015-08-20 DIAGNOSIS — N951 Menopausal and female climacteric states: Secondary | ICD-10-CM | POA: Diagnosis not present

## 2015-08-20 DIAGNOSIS — G4700 Insomnia, unspecified: Secondary | ICD-10-CM

## 2015-08-20 MED ORDER — DULOXETINE HCL 20 MG PO CPEP
20.0000 mg | ORAL_CAPSULE | Freq: Every day | ORAL | Status: DC
Start: 2015-08-20 — End: 2015-09-18

## 2015-08-20 MED ORDER — PREDNISONE 50 MG PO TABS: ORAL_TABLET | ORAL | Status: DC

## 2015-08-20 MED ORDER — SUVOREXANT 10 MG PO TABS: 10.0000 mg | ORAL_TABLET | Freq: Every evening | ORAL | Status: DC | PRN

## 2015-08-20 NOTE — Assessment & Plan Note (Signed)
Will wean off the cymbalta. Rx for lower dose given to patient. Call with any concerns.

## 2015-08-20 NOTE — Assessment & Plan Note (Signed)
Would like to hold on sleep study at this time. Will try belsomra. Controlled substance agreement signed and scanned. Return in 1 month to see how it's working.

## 2015-08-20 NOTE — Progress Notes (Signed)
BP 123/83 mmHg  Pulse 91  Temp(Src) 98.3 F (36.8 C)  Wt 151 lb (68.493 kg)  SpO2 97%   Subjective:    Patient ID: Morgan Cisneros, female    DOB: [birth year], 50 y.o.   MRN: [id number]  HPI: Morgan Cisneros is a 50 y.o. female  Chief Complaint  Patient presents with  . Sinusitis    She saw UC last week and was given Augmentin for sinus infection but just doesn't feel like it's getting any better.   UPPER RESPIRATORY TRACT INFECTION Duration: 1 week- went to the urgent care a week ago, has been on augmentin BID x 1 week Worst symptom: cough, drainage Fever: no Cough: yes Shortness of breath: no Wheezing: no Chest pain: no Chest tightness: no Chest congestion: yes Nasal congestion: yes Runny nose: no Post nasal drip: yes Sneezing: no Sore throat: yes Swollen glands: no Sinus pressure: yes Headache: yes Face pain: yes- better than last week Toothache: no Ear pain: no  Ear pressure: no  Eyes red/itching:no Eye drainage/crusting: no  Vomiting: no Rash: no Fatigue: yes Sick contacts: yes Strep contacts: yes  Context: better - taking a long time Recurrent sinusitis: no Relief with OTC cold/cough medications: no  Treatments attempted: mucinex and antibiotics   INSOMNIA Duration: chronic- since hysterectomy. Cymbalta not helping, doesn't want to take it, has been weaning herself off Satisfied with sleep quality: no Difficulty falling asleep: no Difficulty staying asleep: yes Waking a few hours after sleep onset: yes Early morning awakenings: yes Daytime hypersomnolence: yes Wakes feeling refreshed: no Good sleep hygiene: yes Apnea: no Snoring: no Depressed/anxious mood: yes Recent stress: yes Restless legs/nocturnal leg cramps: no Chronic pain/arthritis: no History of sleep study: no Treatments attempted: melatonin, uinsom and benadryl   Relevant past medical, surgical, family and social history reviewed and updated as indicated. Interim medical  history since our last visit reviewed. Allergies and medications reviewed and updated.  Review of Systems  Constitutional: Negative.   HENT: Positive for congestion, postnasal drip, rhinorrhea and sinus pressure. Negative for dental problem, drooling, ear discharge, ear pain, hearing loss, mouth sores, nosebleeds, sneezing, sore throat, tinnitus and trouble swallowing.   Respiratory: Negative.   Cardiovascular: Negative.   Psychiatric/Behavioral: Negative.     Per HPI unless specifically indicated above     Objective:    BP 123/83 mmHg  Pulse 91  Temp(Src) 98.3 F (36.8 C)  Wt 151 lb (68.493 kg)  SpO2 97%  Wt Readings from Last 3 Encounters:  08/20/15 151 lb (68.493 kg)  08/13/15 145 lb (65.772 kg)  07/15/15 145 lb (65.772 kg)    Physical Exam  Constitutional: She is oriented to person, place, and time. She appears well-developed and well-nourished. No distress.  HENT:  Head: Normocephalic and atraumatic.  Right Ear: Hearing and external ear normal.  Left Ear: Hearing and external ear normal.  Nose: Mucosal edema and rhinorrhea present. Right sinus exhibits no maxillary sinus tenderness and no frontal sinus tenderness. Left sinus exhibits no maxillary sinus tenderness and no frontal sinus tenderness.  Mouth/Throat: Oropharynx is clear and moist. No oropharyngeal exudate.  Eyes: Conjunctivae, EOM and lids are normal. Pupils are equal, round, and reactive to light. Right eye exhibits no discharge. Left eye exhibits no discharge. No scleral icterus.  Neck: Normal range of motion. Neck supple. No JVD present. No tracheal deviation present. No thyromegaly present.  Cardiovascular: Normal rate, regular rhythm, normal heart sounds and intact distal pulses.  Exam reveals no gallop and  no friction rub.   No murmur heard. Pulmonary/Chest: Effort normal. No stridor. No respiratory distress. She has no wheezes. She has no rales. She exhibits no tenderness.  Musculoskeletal: Normal range  of motion.  Lymphadenopathy:    She has cervical adenopathy.  Neurological: She is alert and oriented to person, place, and time.  Skin: Skin is intact. No rash noted. She is not diaphoretic.  Psychiatric: She has a normal mood and affect. Her speech is normal and behavior is normal. Judgment and thought content normal. Cognition and memory are normal.  Nursing note and vitals reviewed.       Assessment & Plan:   Problem List Items Addressed This Visit      Other   Insomnia    Would like to hold on sleep study at this time. Will try belsomra. Controlled substance agreement signed and scanned. Return in 1 month to see how it's working.       Menopausal symptoms    Will wean off the cymbalta. Rx for lower dose given to patient. Call with any concerns.        Other Visit Diagnoses    Acute sinusitis, recurrence not specified, unspecified location    -  Primary    Resolving, but still not feeling better. Finish augmentin. 3 day burst of prednisone. Call if not better or worse.     Relevant Medications    predniSONE (DELTASONE) 50 MG tablet        Follow up plan: Return in about 4 weeks (around 09/17/2015) for follow up sleep.

## 2015-09-18 ENCOUNTER — Ambulatory Visit (INDEPENDENT_AMBULATORY_CARE_PROVIDER_SITE_OTHER): Payer: BC Managed Care – PPO | Admitting: Family Medicine

## 2015-09-18 ENCOUNTER — Encounter: Payer: Self-pay | Admitting: Family Medicine

## 2015-09-18 VITALS — BP 124/87 | HR 96 | Temp 98.2°F | Wt 149.0 lb

## 2015-09-18 DIAGNOSIS — J029 Acute pharyngitis, unspecified: Secondary | ICD-10-CM | POA: Diagnosis not present

## 2015-09-18 DIAGNOSIS — N951 Menopausal and female climacteric states: Secondary | ICD-10-CM | POA: Diagnosis not present

## 2015-09-18 DIAGNOSIS — G4700 Insomnia, unspecified: Secondary | ICD-10-CM | POA: Diagnosis not present

## 2015-09-18 MED ORDER — SUVOREXANT 10 MG PO TABS: 10.0000 mg | ORAL_TABLET | Freq: Every evening | ORAL | Status: DC | PRN

## 2015-09-18 MED ORDER — DULOXETINE HCL 40 MG PO CPEP: ORAL_CAPSULE | ORAL | Status: DC

## 2015-09-18 NOTE — Assessment & Plan Note (Signed)
Doing better on the cymbalta. Continue current regimen. Continue to monitor.

## 2015-09-18 NOTE — Assessment & Plan Note (Signed)
Better on current regimen. Continue current regimen. Recheck 3 months.

## 2015-09-18 NOTE — Progress Notes (Signed)
BP 124/87 mmHg  Pulse 96  Temp(Src) 98.2 F (36.8 C)  Wt 149 lb (67.586 kg)  SpO2 99%   Subjective:    Patient ID: Morgan Cisneros, female    DOB: 11/02/1965, 50 y.o.   MRN: [id number]  HPI: Morgan Cisneros is a 50 y.o. female  Chief Complaint  Patient presents with  . Insomnia   Cymbalta is helping. Mood is better. Feels good on the cymbalta and would like to continue on it.   INSOMNIA Duration: chronic Satisfied with sleep quality: yes Difficulty falling asleep: not on the medicine Difficulty staying asleep: not on the medicine Waking a few hours after sleep onset: not on the medicine Early morning awakenings: not on the medicine Daytime hypersomnolence: no Wakes feeling refreshed: yes Good sleep hygiene: yes Apnea: no Snoring: no Depressed/anxious mood: yes Recent stress: no Restless legs/nocturnal leg cramps: no Chronic pain/arthritis: no History of sleep study: no Treatments attempted: melatonin, uinsom and benadryl   UPPER RESPIRATORY TRACT INFECTION Duration: 1 day Worst symptom: sore throat Fever: no Cough: yes Shortness of breath: no Wheezing: no Chest pain: no Chest tightness: no Chest congestion: yes Nasal congestion: yes Runny nose: no Post nasal drip: yes Sneezing: no Sore throat: yes Swollen glands: no Sinus pressure: yes Headache: yes Face pain: yes Toothache: no Ear pain: no  Ear pressure: no  Eyes red/itching:no Eye drainage/crusting: no  Vomiting: no Rash: no Fatigue: yes Sick contacts: yes Strep contacts: yes  Context: worse Recurrent sinusitis: no Relief with OTC cold/cough medications: yes  Treatments attempted: ibuprofen and pseudoephedrine    Relevant past medical, surgical, family and social history reviewed and updated as indicated. Interim medical history since our last visit reviewed. Allergies and medications reviewed and updated.  Review of Systems  Constitutional: Positive for fatigue. Negative for fever,  chills, diaphoresis, activity change, appetite change and unexpected weight change.  HENT: Positive for congestion, postnasal drip, rhinorrhea, sinus pressure and sore throat. Negative for dental problem, drooling, ear discharge, ear pain, facial swelling, hearing loss, mouth sores, nosebleeds, sneezing, tinnitus, trouble swallowing and voice change.   Respiratory: Negative.   Cardiovascular: Negative.   Psychiatric/Behavioral: Negative.     Per HPI unless specifically indicated above     Objective:    BP 124/87 mmHg  Pulse 96  Temp(Src) 98.2 F (36.8 C)  Wt 149 lb (67.586 kg)  SpO2 99%  Wt Readings from Last 3 Encounters:  09/18/15 149 lb (67.586 kg)  08/20/15 151 lb (68.493 kg)  08/13/15 145 lb (65.772 kg)    Physical Exam  Constitutional: She is oriented to person, place, and time. She appears well-developed and well-nourished. No distress.  HENT:  Head: Normocephalic and atraumatic.  Right Ear: Hearing, tympanic membrane, external ear and ear canal normal.  Left Ear: Hearing, tympanic membrane, external ear and ear canal normal.  Nose: Nose normal.  Mouth/Throat: Uvula is midline and mucous membranes are normal. Posterior oropharyngeal edema present. No oropharyngeal exudate.  Eyes: Conjunctivae, EOM and lids are normal. Pupils are equal, round, and reactive to light. Right eye exhibits no discharge. Left eye exhibits no discharge. No scleral icterus.  Neck: Normal range of motion. Neck supple. No JVD present. No tracheal deviation present. No thyromegaly present.  Cardiovascular: Normal rate, regular rhythm, normal heart sounds and intact distal pulses.  Exam reveals no gallop and no friction rub.   No murmur heard. Pulmonary/Chest: Effort normal and breath sounds normal. No stridor. No respiratory distress. She has no wheezes. She  has no rales. She exhibits no tenderness.  Musculoskeletal: Normal range of motion.  Lymphadenopathy:    She has cervical adenopathy.   Neurological: She is alert and oriented to person, place, and time.  Skin: Skin is warm, dry and intact. No rash noted. She is not diaphoretic. No erythema. No pallor.  Psychiatric: She has a normal mood and affect. Her speech is normal and behavior is normal. Judgment and thought content normal. Cognition and memory are normal.  Nursing note and vitals reviewed.   Results for orders placed or performed during the hospital encounter of 06/26/15  Rapid strep screen  Result Value Ref Range   Streptococcus, Group A Screen (Direct) NEGATIVE NEGATIVE  Culture, group A strep  Result Value Ref Range   Specimen Description THROAT    Special Requests NONE Reflexed from [id number]    Culture NO BETA HEMOLYTIC STREPTOCOCCI ISOLATED    Report Status 06/28/2015 FINAL       Assessment & Plan:   Problem List Items Addressed This Visit      Other   Insomnia - Primary    Better on current regimen. Continue current regimen. Recheck 3 months.       Menopausal symptoms    Doing better on the cymbalta. Continue current regimen. Continue to monitor.        Other Visit Diagnoses    Sore throat        Negative strep. Likely allergic or viral. Await results.     Relevant Orders    Rapid strep screen (not at [facility])        Follow up plan: Return in about 4 months (around 01/18/2016) for follow up insomnia.

## 2015-09-19 ENCOUNTER — Telehealth: Payer: Self-pay | Admitting: Family Medicine

## 2015-09-19 MED ORDER — PREDNISONE 10 MG PO TABS: ORAL_TABLET | ORAL | Status: DC

## 2015-09-19 NOTE — Telephone Encounter (Signed)
Pt called stated she feels horrible, would like a call back from Dr. Wynetta Emery as soon as possible. Thanks

## 2015-09-19 NOTE — Telephone Encounter (Signed)
Patient states that she thinks that she has a sinus infection, she has tons of snot and it is lime green, so has been up since 4am. Verbal from [place], she will send over prednisone to the [facility]

## 2015-09-19 NOTE — Telephone Encounter (Signed)
Rx sent to her pharmacy 

## 2015-09-22 ENCOUNTER — Telehealth: Payer: Self-pay | Admitting: Family Medicine

## 2015-09-22 LAB — RAPID STREP SCREEN (MED CTR MEBANE ONLY): Strep Gp A Ag, IA W/Reflex: NEGATIVE

## 2015-09-22 LAB — CULTURE, GROUP A STREP: Strep A Culture: NEGATIVE

## 2015-09-22 MED ORDER — AZITHROMYCIN 250 MG PO TABS: ORAL_TABLET | ORAL | Status: DC

## 2015-09-22 NOTE — Telephone Encounter (Signed)
Spoke with patient, she states that it feels like it has moved into her chest, she has been up all night coughing.   [phone]

## 2015-09-22 NOTE — Telephone Encounter (Signed)
Pt called stated she still does not feel well believes the cold has gone down into her chest. Would like a call back from [facility]. Pt will be in a meeting at 12:30 if you call after this time please call pt @ [phone]. Thanks.

## 2015-09-22 NOTE — Telephone Encounter (Signed)
Rx sent to her pharmacy 

## 2015-10-16 ENCOUNTER — Ambulatory Visit: Payer: BC Managed Care – PPO | Admitting: Family Medicine

## 2015-10-30 ENCOUNTER — Ambulatory Visit: Payer: BC Managed Care – PPO | Admitting: Family Medicine

## 2016-05-06 ENCOUNTER — Encounter: Payer: Self-pay | Admitting: Family Medicine

## 2016-05-06 MED ORDER — LORAZEPAM 0.5 MG PO TABS: 0.2500 mg | ORAL_TABLET | Freq: Two times a day (BID) | ORAL | 0 refills | Status: DC | PRN

## 2016-05-06 NOTE — Telephone Encounter (Signed)
OK to call in

## 2016-05-06 NOTE — Telephone Encounter (Signed)
Routing to provider  

## 2016-06-02 ENCOUNTER — Ambulatory Visit: Payer: BC Managed Care – PPO | Admitting: Family Medicine

## 2016-06-02 ENCOUNTER — Other Ambulatory Visit: Payer: Self-pay | Admitting: Family Medicine

## 2016-06-04 ENCOUNTER — Encounter: Payer: Self-pay | Admitting: Family Medicine

## 2016-06-04 ENCOUNTER — Ambulatory Visit (INDEPENDENT_AMBULATORY_CARE_PROVIDER_SITE_OTHER): Payer: BC Managed Care – PPO | Admitting: Family Medicine

## 2016-06-04 VITALS — BP 127/85 | HR 87 | Temp 98.2°F | Wt 152.0 lb

## 2016-06-04 DIAGNOSIS — F4322 Adjustment disorder with anxiety: Secondary | ICD-10-CM

## 2016-06-04 DIAGNOSIS — Z720 Tobacco use: Secondary | ICD-10-CM

## 2016-06-04 MED ORDER — LORAZEPAM 1 MG PO TABS: 0.5000 mg | ORAL_TABLET | Freq: Two times a day (BID) | ORAL | 1 refills | Status: DC | PRN

## 2016-06-04 MED ORDER — VARENICLINE TARTRATE 1 MG PO TABS: 1.0000 mg | ORAL_TABLET | Freq: Two times a day (BID) | ORAL | 2 refills | Status: DC

## 2016-06-04 MED ORDER — DULOXETINE HCL 60 MG PO CPEP: 60.0000 mg | ORAL_CAPSULE | Freq: Every day | ORAL | 3 refills | Status: DC

## 2016-06-04 MED ORDER — VARENICLINE TARTRATE 0.5 MG X 11 & 1 MG X 42 PO MISC
ORAL | 0 refills | Status: DC
Start: 2016-06-04 — End: 2016-07-08

## 2016-06-04 NOTE — Progress Notes (Signed)
BP 127/85 (BP Location: Left Arm, Patient Position: Sitting, Cuff Size: Normal)   Pulse 87   Temp 98.2 F (36.8 C)   Wt 152 lb (68.9 kg)   SpO2 100%   BMI 24.53 kg/m    Subjective:    Patient ID: Morgan Cisneros, female    DOB: [birth year], 51 y.o.   MRN: [id number]  HPI: Morgan Cisneros is a 51 y.o. female  Chief Complaint  Patient presents with  . Hypertension    pt states that her BP has been running high for the past 2 weeks. states that it was 146/100 last night.  . Anxiety    pt states that the Lorazepam is not helping her, states that she has never had a panic attack but feels like her heart is going to jump out of her chest  . Nicotine Dependence    pt states that she started smoking again, states she would like a prescription for chantix   ANXIETY/STRESS Duration:exacerbated- Dad has cancer, having her father-in law living with her and he just had surgery. Her BP has been running a bit high  Anxious mood: yes  Excessive worrying: yes Irritability: no  Sweating: no Nausea: no Palpitations:yes Hyperventilation: no Panic attacks: yes Agoraphobia: no  Obscessions/compulsions: no Depressed mood: no Depression screen [facility] 2/9 05/16/2015 04/18/2015  Decreased Interest 1 1  Down, Depressed, Hopeless 1 0  PHQ - 2 Score 2 1   GAD 7 : Generalized Anxiety Score 06/04/2016 04/18/2015  Nervous, Anxious, on Edge 3 1  Control/stop worrying 3 3  Worry too much - different things 3 3  Trouble relaxing 3 2  Restless 3 2  Easily annoyed or irritable 3 3  Afraid - awful might happen 3 2  Total GAD 7 Score 21 16  Anxiety Difficulty Very difficult Very difficult   Anhedonia: no Weight changes: no Insomnia: yes hard to fall asleep  Hypersomnia: no Fatigue/loss of energy: no Feelings of worthlessness: no Feelings of guilt: yes Impaired concentration/indecisiveness: no Suicidal ideations: no  Crying spells: yes Recent Stressors/Life Changes: yes   Relationship  problems: no   Family stress: yes     Financial stress: no    Job stress: no    Recent death/loss: no  SMOKING CESSATION Smoking Status: current every day smoker Smoking Amount: 1/2 ppd Smoking Onset: 4 weeks Smoking Quit Date: not set Smoking triggers: stress Type of tobacco use: cigarettes Children in the house: no Other household members who smoke: no Treatments attempted: chantix  Pneumovax: Up to date  Relevant past medical, surgical, family and social history reviewed and updated as indicated. Interim medical history since our last visit reviewed. Allergies and medications reviewed and updated.  Review of Systems  Constitutional: Negative.   Respiratory: Negative.   Cardiovascular: Negative.   Neurological: Positive for headaches.  Psychiatric/Behavioral: Positive for agitation, behavioral problems and sleep disturbance. Negative for confusion, decreased concentration, dysphoric mood, hallucinations, self-injury and suicidal ideas. The patient is nervous/anxious. The patient is not hyperactive.     Per HPI unless specifically indicated above     Objective:    BP 127/85 (BP Location: Left Arm, Patient Position: Sitting, Cuff Size: Normal)   Pulse 87   Temp 98.2 F (36.8 C)   Wt 152 lb (68.9 kg)   SpO2 100%   BMI 24.53 kg/m   Wt Readings from Last 3 Encounters:  06/04/16 152 lb (68.9 kg)  09/18/15 149 lb (67.6 kg)  08/20/15 151 lb (68.5 kg)  Physical Exam  Constitutional: She is oriented to person, place, and time. She appears well-developed and well-nourished. No distress.  HENT:  Head: Normocephalic and atraumatic.  Right Ear: Hearing normal.  Left Ear: Hearing normal.  Nose: Nose normal.  Eyes: Conjunctivae and lids are normal. Right eye exhibits no discharge. Left eye exhibits no discharge. No scleral icterus.  Cardiovascular: Normal rate, regular rhythm, normal heart sounds and intact distal pulses.  Exam reveals no gallop and no friction rub.   No  murmur heard. Pulmonary/Chest: Effort normal and breath sounds normal. No respiratory distress. She has no wheezes. She has no rales. She exhibits no tenderness.  Musculoskeletal: Normal range of motion.  Neurological: She is alert and oriented to person, place, and time.  Skin: Skin is warm and intact. No rash noted. No erythema. No pallor.  Psychiatric: She has a normal mood and affect. Her speech is normal and behavior is normal. Judgment and thought content normal. Cognition and memory are normal.  Nursing note and vitals reviewed.   Results for orders placed or performed in visit on 09/18/15  Rapid strep screen (not at [facility])  Result Value Ref Range   Strep Gp A Ag, IA W/Reflex Negative Negative  Culture, Group A Strep  Result Value Ref Range   Strep A Culture Negative       Assessment & Plan:   Problem List Items Addressed This Visit      Other   Tobacco abuse    Will get her on the chantix. Recheck 4-6 weeks.        Other Visit Diagnoses    Acute adjustment disorder with anxiety    -  Primary   Will increase cymbalta to 60mg  daily. Will increase lorazepam to 0.5-1mg  BID PRN. Recheck in 4-6 weeks. Call with any concerns.        Follow up plan: Return 4-6 weeks, for Recheck smoking and mood.

## 2016-06-04 NOTE — Assessment & Plan Note (Signed)
Will get her on the chantix. Recheck 4-6 weeks.

## 2016-06-10 ENCOUNTER — Other Ambulatory Visit: Payer: Self-pay | Admitting: Family Medicine

## 2016-06-10 DIAGNOSIS — Z1231 Encounter for screening mammogram for malignant neoplasm of breast: Secondary | ICD-10-CM

## 2016-07-08 ENCOUNTER — Ambulatory Visit (INDEPENDENT_AMBULATORY_CARE_PROVIDER_SITE_OTHER): Payer: BC Managed Care – PPO | Admitting: Family Medicine

## 2016-07-08 ENCOUNTER — Encounter: Payer: Self-pay | Admitting: Family Medicine

## 2016-07-08 VITALS — BP 97/70 | HR 84 | Temp 97.9°F | Resp 17 | Ht 65.75 in | Wt 149.0 lb

## 2016-07-08 DIAGNOSIS — Z0000 Encounter for general adult medical examination without abnormal findings: Secondary | ICD-10-CM

## 2016-07-08 DIAGNOSIS — R55 Syncope and collapse: Secondary | ICD-10-CM

## 2016-07-08 DIAGNOSIS — F4322 Adjustment disorder with anxiety: Secondary | ICD-10-CM | POA: Diagnosis not present

## 2016-07-08 MED ORDER — DULOXETINE HCL 40 MG PO CPEP: 40.0000 mg | ORAL_CAPSULE | Freq: Every day | ORAL | 1 refills | Status: DC

## 2016-07-08 NOTE — Patient Instructions (Addendum)
Health Maintenance, Female Adopting a healthy lifestyle and getting preventive care can go a long way to promote health and wellness. Talk with your health care provider about what schedule of regular examinations is right for you. This is a good chance for you to check in with your provider about disease prevention and staying healthy. In between checkups, there are plenty of things you can do on your own. Experts have done a lot of research about which lifestyle changes and preventive measures are most likely to keep you healthy. Ask your health care provider for more information. Weight and diet Eat a healthy diet  Be sure to include plenty of vegetables, fruits, low-fat dairy products, and lean protein.  Do not eat a lot of foods high in solid fats, added sugars, or salt.  Get regular exercise. This is one of the most important things you can do for your health.  Most adults should exercise for at least 150 minutes each week. The exercise should increase your heart rate and make you sweat (moderate-intensity exercise).  Most adults should also do strengthening exercises at least twice a week. This is in addition to the moderate-intensity exercise. Maintain a healthy weight  Body mass index (BMI) is a measurement that can be used to identify possible weight problems. It estimates body fat based on height and weight. Your health care provider can help determine your BMI and help you achieve or maintain a healthy weight.  For females 50 years of age and older:  A BMI below 18.5 is considered underweight.  A BMI of 18.5 to 24.9 is normal.  A BMI of 25 to 29.9 is considered overweight.  A BMI of 30 and above is considered obese. Watch levels of cholesterol and blood lipids  You should start having your blood tested for lipids and cholesterol at 50 years of age, then have this test every 5 years.  You may need to have your cholesterol levels checked more often if:  Your lipid or  cholesterol levels are high.  You are older than 50 years of age.  You are at high risk for heart disease. Cancer screening Lung Cancer  Lung cancer screening is recommended for adults 50-42 years old who are at high risk for lung cancer because of a history of smoking.  A yearly low-dose CT scan of the lungs is recommended for people who:  Currently smoke.  Have quit within the past 15 years.  Have at least a 30-pack-year history of smoking. A pack year is smoking an average of one pack of cigarettes a day for 1 year.  Yearly screening should continue until it has been 15 years since you quit.  Yearly screening should stop if you develop a health problem that would prevent you from having lung cancer treatment. Breast Cancer  Practice breast self-awareness. This means understanding how your breasts normally appear and feel.  It also means doing regular breast self-exams. Let your health care provider know about any changes, no matter how small.  If you are in your 20s or 30s, you should have a clinical breast exam (CBE) by a health care provider every 1-3 years as part of a regular health exam.  If you are 50 or older, have a CBE every year. Also consider having a breast X-ray (mammogram) every year.  If you have a family history of breast cancer, talk to your health care provider about genetic screening.  If you are at high risk for breast cancer, talk  to your health care provider about having an MRI and a mammogram every year.  Breast cancer gene (BRCA) assessment is recommended for women who have family members with BRCA-related cancers. BRCA-related cancers include:  Breast.  Ovarian.  Tubal.  Peritoneal cancers.  Results of the assessment will determine the need for genetic counseling and BRCA1 and BRCA2 testing. Cervical Cancer  Your health care provider may recommend that you be screened regularly for cancer of the pelvic organs (ovaries, uterus, and vagina).  This screening involves a pelvic examination, including checking for microscopic changes to the surface of your cervix (Pap test). You may be encouraged to have this screening done every 3 years, beginning at age 50.  For women ages 50-65, health care providers may recommend pelvic exams and Pap testing every 3 years, or they may recommend the Pap and pelvic exam, combined with testing for human papilloma virus (HPV), every 5 years. Some types of HPV increase your risk of cervical cancer. Testing for HPV may also be done on women of any age with unclear Pap test results.  Other health care providers may not recommend any screening for nonpregnant women who are considered low risk for pelvic cancer and who do not have symptoms. Ask your health care provider if a screening pelvic exam is right for you.  If you have had past treatment for cervical cancer or a condition that could lead to cancer, you need Pap tests and screening for cancer for at least 20 years after your treatment. If Pap tests have been discontinued, your risk factors (such as having a new sexual partner) need to be reassessed to determine if screening should resume. Some women have medical problems that increase the chance of getting cervical cancer. In these cases, your health care provider may recommend more frequent screening and Pap tests. Colorectal Cancer  This type of cancer can be detected and often prevented.  Routine colorectal cancer screening usually begins at 50 years of age and continues through 50 years of age.  Your health care provider may recommend screening at an earlier age if you have risk factors for colon cancer.  Your health care provider may also recommend using home test kits to check for hidden blood in the stool.  A small camera at the end of a tube can be used to examine your colon directly (sigmoidoscopy or colonoscopy). This is done to check for the earliest forms of colorectal cancer.  Routine  screening usually begins at age 50.  Direct examination of the colon should be repeated every 5-10 years through 50 years of age. However, you may need to be screened more often if early forms of precancerous polyps or small growths are found. Skin Cancer  Check your skin from head to toe regularly.  Tell your health care provider about any new moles or changes in moles, especially if there is a change in a mole's shape or color.  Also tell your health care provider if you have a mole that is larger than the size of a pencil eraser.  Always use sunscreen. Apply sunscreen liberally and repeatedly throughout the day.  Protect yourself by wearing long sleeves, pants, a wide-brimmed hat, and sunglasses whenever you are outside. Heart disease, diabetes, and high blood pressure  High blood pressure causes heart disease and increases the risk of stroke. High blood pressure is more likely to develop in:  People who have blood pressure in the high end of the normal range (130-139/85-89 mm Hg).  People who are overweight or obese.  People who are African American.  If you are 59-24 years of age, have your blood pressure checked every 3-5 years. If you are 34 years of age or older, have your blood pressure checked every year. You should have your blood pressure measured twice-once when you are at a hospital or clinic, and once when you are not at a hospital or clinic. Record the average of the two measurements. To check your blood pressure when you are not at a hospital or clinic, you can use:  An automated blood pressure machine at a pharmacy.  A home blood pressure monitor.  If you are between 29 years and 60 years old, ask your health care provider if you should take aspirin to prevent strokes.  Have regular diabetes screenings. This involves taking a blood sample to check your fasting blood sugar level.  If you are at a normal weight and have a low risk for diabetes, have this test once  every three years after 50 years of age.  If you are overweight and have a high risk for diabetes, consider being tested at a younger age or more often. Preventing infection Hepatitis B  If you have a higher risk for hepatitis B, you should be screened for this virus. You are considered at high risk for hepatitis B if:  You were born in a country where hepatitis B is common. Ask your health care provider which countries are considered high risk.  Your parents were born in a high-risk country, and you have not been immunized against hepatitis B (hepatitis B vaccine).  You have HIV or AIDS.  You use needles to inject street drugs.  You live with someone who has hepatitis B.  You have had sex with someone who has hepatitis B.  You get hemodialysis treatment.  You take certain medicines for conditions, including cancer, organ transplantation, and autoimmune conditions. Hepatitis C  Blood testing is recommended for:  Everyone born from [birth year] through 1964.  Anyone with known risk factors for hepatitis C. Sexually transmitted infections (STIs)  You should be screened for sexually transmitted infections (STIs) including gonorrhea and chlamydia if:  You are sexually active and are younger than 50 years of age.  You are older than 50 years of age and your health care provider tells you that you are at risk for this type of infection.  Your sexual activity has changed since you were last screened and you are at an increased risk for chlamydia or gonorrhea. Ask your health care provider if you are at risk.  If you do not have HIV, but are at risk, it may be recommended that you take a prescription medicine daily to prevent HIV infection. This is called pre-exposure prophylaxis (PrEP). You are considered at risk if:  You are sexually active and do not regularly use condoms or know the HIV status of your partner(s).  You take drugs by injection.  You are sexually active with a partner  who has HIV. Talk with your health care provider about whether you are at high risk of being infected with HIV. If you choose to begin PrEP, you should first be tested for HIV. You should then be tested every 3 months for as long as you are taking PrEP. Pregnancy  If you are premenopausal and you may become pregnant, ask your health care provider about preconception counseling.  If you may become pregnant, take 400 to 800 micrograms (mcg) of folic acid  every day.  If you want to prevent pregnancy, talk to your health care provider about birth control (contraception). Osteoporosis and menopause  Osteoporosis is a disease in which the bones lose minerals and strength with aging. This can result in serious bone fractures. Your risk for osteoporosis can be identified using a bone density scan.  If you are 63 years of age or older, or if you are at risk for osteoporosis and fractures, ask your health care provider if you should be screened.  Ask your health care provider whether you should take a calcium or vitamin D supplement to lower your risk for osteoporosis.  Menopause may have certain physical symptoms and risks.  Hormone replacement therapy may reduce some of these symptoms and risks. Talk to your health care provider about whether hormone replacement therapy is right for you. Follow these instructions at home:  Schedule regular health, dental, and eye exams.  Stay current with your immunizations.  Do not use any tobacco products including cigarettes, chewing tobacco, or electronic cigarettes.  If you are pregnant, do not drink alcohol.  If you are breastfeeding, limit how much and how often you drink alcohol.  Limit alcohol intake to no more than 1 drink per day for nonpregnant women. One drink equals 12 ounces of beer, 5 ounces of wine, or 1 ounces of hard liquor.  Do not use street drugs.  Do not share needles.  Ask your health care provider for help if you need support  or information about quitting drugs.  Tell your health care provider if you often feel depressed.  Tell your health care provider if you have ever been abused or do not feel safe at home. This information is not intended to replace advice given to you by your health care provider. Make sure you discuss any questions you have with your health care provider. Document Released: 10/05/2010 Document Revised: 08/28/2015 Document Reviewed: 12/24/2014 Elsevier Interactive Patient Education  2017 [place] Maintenance for Postmenopausal Women Menopause is a normal process in which your reproductive ability comes to an end. This process happens gradually over a span of months to years, usually between the ages of 48 and 29. Menopause is complete when you have missed 12 consecutive menstrual periods. It is important to talk with your health care provider about some of the most common conditions that affect postmenopausal women, such as heart disease, cancer, and bone loss (osteoporosis). Adopting a healthy lifestyle and getting preventive care can help to promote your health and wellness. Those actions can also lower your chances of developing some of these common conditions. What should I know about menopause? During menopause, you may experience a number of symptoms, such as:  Moderate-to-severe hot flashes.  Night sweats.  Decrease in sex drive.  Mood swings.  Headaches.  Tiredness.  Irritability.  Memory problems.  Insomnia. Choosing to treat or not to treat menopausal changes is an individual decision that you make with your health care provider. What should I know about hormone replacement therapy and supplements? Hormone therapy products are effective for treating symptoms that are associated with menopause, such as hot flashes and night sweats. Hormone replacement carries certain risks, especially as you become older. If you are thinking about using estrogen or estrogen  with progestin treatments, discuss the benefits and risks with your health care provider. What should I know about heart disease and stroke? Heart disease, heart attack, and stroke become more likely as you age. This may be due, in  part, to the hormonal changes that your body experiences during menopause. These can affect how your body processes dietary fats, triglycerides, and cholesterol. Heart attack and stroke are both medical emergencies. There are many things that you can do to help prevent heart disease and stroke:  Have your blood pressure checked at least every 1-2 years. High blood pressure causes heart disease and increases the risk of stroke.  If you are 54-61 years old, ask your health care provider if you should take aspirin to prevent a heart attack or a stroke.  Do not use any tobacco products, including cigarettes, chewing tobacco, or electronic cigarettes. If you need help quitting, ask your health care provider.  It is important to eat a healthy diet and maintain a healthy weight.  Be sure to include plenty of vegetables, fruits, low-fat dairy products, and lean protein.  Avoid eating foods that are high in solid fats, added sugars, or salt (sodium).  Get regular exercise. This is one of the most important things that you can do for your health.  Try to exercise for at least 150 minutes each week. The type of exercise that you do should increase your heart rate and make you sweat. This is known as moderate-intensity exercise.  Try to do strengthening exercises at least twice each week. Do these in addition to the moderate-intensity exercise.  Know your numbers.Ask your health care provider to check your cholesterol and your blood glucose. Continue to have your blood tested as directed by your health care provider. What should I know about cancer screening? There are several types of cancer. Take the following steps to reduce your risk and to catch any cancer development  as early as possible. Breast Cancer  Practice breast self-awareness.  This means understanding how your breasts normally appear and feel.  It also means doing regular breast self-exams. Let your health care provider know about any changes, no matter how small.  If you are 63 or older, have a clinician do a breast exam (clinical breast exam or CBE) every year. Depending on your age, family history, and medical history, it may be recommended that you also have a yearly breast X-ray (mammogram).  If you have a family history of breast cancer, talk with your health care provider about genetic screening.  If you are at high risk for breast cancer, talk with your health care provider about having an MRI and a mammogram every year.  Breast cancer (BRCA) gene test is recommended for women who have family members with BRCA-related cancers. Results of the assessment will determine the need for genetic counseling and BRCA1 and for BRCA2 testing. BRCA-related cancers include these types:  Breast. This occurs in males or females.  Ovarian.  Tubal. This may also be called fallopian tube cancer.  Cancer of the abdominal or pelvic lining (peritoneal cancer).  Prostate.  Pancreatic. Cervical, Uterine, and Ovarian Cancer  Your health care provider may recommend that you be screened regularly for cancer of the pelvic organs. These include your ovaries, uterus, and vagina. This screening involves a pelvic exam, which includes checking for microscopic changes to the surface of your cervix (Pap test).  For women ages 21-65, health care providers may recommend a pelvic exam and a Pap test every three years. For women ages 61-65, they may recommend the Pap test and pelvic exam, combined with testing for human papilloma virus (HPV), every five years. Some types of HPV increase your risk of cervical cancer. Testing for HPV may  also be done on women of any age who have unclear Pap test results.  Other health  care providers may not recommend any screening for nonpregnant women who are considered low risk for pelvic cancer and have no symptoms. Ask your health care provider if a screening pelvic exam is right for you.  If you have had past treatment for cervical cancer or a condition that could lead to cancer, you need Pap tests and screening for cancer for at least 20 years after your treatment. If Pap tests have been discontinued for you, your risk factors (such as having a new sexual partner) need to be reassessed to determine if you should start having screenings again. Some women have medical problems that increase the chance of getting cervical cancer. In these cases, your health care provider may recommend that you have screening and Pap tests more often.  If you have a family history of uterine cancer or ovarian cancer, talk with your health care provider about genetic screening.  If you have vaginal bleeding after reaching menopause, tell your health care provider.  There are currently no reliable tests available to screen for ovarian cancer. Lung Cancer  Lung cancer screening is recommended for adults 61-47 years old who are at high risk for lung cancer because of a history of smoking. A yearly low-dose CT scan of the lungs is recommended if you:  Currently smoke.  Have a history of at least 30 pack-years of smoking and you currently smoke or have quit within the past 15 years. A pack-year is smoking an average of one pack of cigarettes per day for one year. Yearly screening should:  Continue until it has been 15 years since you quit.  Stop if you develop a health problem that would prevent you from having lung cancer treatment. Colorectal Cancer  This type of cancer can be detected and can often be prevented.  Routine colorectal cancer screening usually begins at age 2 and continues through age 50.  If you have risk factors for colon cancer, your health care provider may recommend  that you be screened at an earlier age.  If you have a family history of colorectal cancer, talk with your health care provider about genetic screening.  Your health care provider may also recommend using home test kits to check for hidden blood in your stool.  A small camera at the end of a tube can be used to examine your colon directly (sigmoidoscopy or colonoscopy). This is done to check for the earliest forms of colorectal cancer.  Direct examination of the colon should be repeated every 5-10 years until age 54. However, if early forms of precancerous polyps or small growths are found or if you have a family history or genetic risk for colorectal cancer, you may need to be screened more often. Skin Cancer  Check your skin from head to toe regularly.  Monitor any moles. Be sure to tell your health care provider:  About any new moles or changes in moles, especially if there is a change in a mole's shape or color.  If you have a mole that is larger than the size of a pencil eraser.  If any of your family members has a history of skin cancer, especially at a young age, talk with your health care provider about genetic screening.  Always use sunscreen. Apply sunscreen liberally and repeatedly throughout the day.  Whenever you are outside, protect yourself by wearing long sleeves, pants, a wide-brimmed hat, and  sunglasses. What should I know about osteoporosis? Osteoporosis is a condition in which bone destruction happens more quickly than new bone creation. After menopause, you may be at an increased risk for osteoporosis. To help prevent osteoporosis or the bone fractures that can happen because of osteoporosis, the following is recommended:  If you are 77-55 years old, get at least 1,000 mg of calcium and at least 600 mg of vitamin D per day.  If you are older than age 10 but younger than age 17, get at least 1,200 mg of calcium and at least 600 mg of vitamin D per day.  If you are  older than age 49, get at least 1,200 mg of calcium and at least 800 mg of vitamin D per day. Smoking and excessive alcohol intake increase the risk of osteoporosis. Eat foods that are rich in calcium and vitamin D, and do weight-bearing exercises several times each week as directed by your health care provider. What should I know about how menopause affects my mental health? Depression may occur at any age, but it is more common as you become older. Common symptoms of depression include:  Low or sad mood.  Changes in sleep patterns.  Changes in appetite or eating patterns.  Feeling an overall lack of motivation or enjoyment of activities that you previously enjoyed.  Frequent crying spells. Talk with your health care provider if you think that you are experiencing depression. What should I know about immunizations? It is important that you get and maintain your immunizations. These include:  Tetanus, diphtheria, and pertussis (Tdap) booster vaccine.  Influenza every year before the flu season begins.  Pneumonia vaccine.  Shingles vaccine. Your health care provider may also recommend other immunizations. This information is not intended to replace advice given to you by your health care provider. Make sure you discuss any questions you have with your health care provider. Document Released: 05/14/2005 Document Revised: 10/10/2015 Document Reviewed: 12/24/2014 Elsevier Interactive Patient Education  2017 [employer].

## 2016-07-08 NOTE — Progress Notes (Signed)
BP 97/70 (BP Location: Left Arm, Patient Position: Sitting, Cuff Size: Normal)   Pulse 84   Temp 97.9 F (36.6 C) (Oral)   Resp 17   Ht 5' 5.75" (1.67 m)   Wt 149 lb (67.6 kg)   SpO2 99%   BMI 24.23 kg/m    Subjective:    Patient ID: Morgan Cisneros, female    DOB: 1965-10-23, 51 y.o.   MRN: [id number]  HPI: Morgan Cisneros is a 51 y.o. female presenting on 07/08/2016 for comprehensive medical examination. Current medical complaints include: Passed out while getting blood drawn before appointment. Now feeling dizzy and nauseous. Other that that has been feeling well. Dad is not doing well with his chemo. Still under a lot of stress. Tolerating it OK. Ativan helping a lot. Not taking it much during the day, but helping her to sleep. No other concerns or complaints at this time.  She currently lives with: husband Menopausal Symptoms: no  Depression Screen done today and results listed below:  Depression screen [facility] 2/9 07/08/2016 05/16/2015 04/18/2015  Decreased Interest 0 1 1  Down, Depressed, Hopeless 1 1 0  PHQ - 2 Score 1 2 1    Past Medical History:  Past Medical History:  Diagnosis Date  . Abdominal pain   . Flank pain   . Gross hematuria   . History of kidney stones   . Insomnia   . Ovarian cyst   . Renal calculus, right   . Thoracic back pain    Right sided    Surgical History:  Past Surgical History:  Procedure Laterality Date  . ABDOMINAL HYSTERECTOMY  08/2013  . CYSTOSCOPY  11/26/2010   with stent placement  . HEMORRHOID SURGERY    . kidney stent    . LITHOTRIPSY     X 4  . TONSILLECTOMY    . TUBAL LIGATION    . Ureteroscopy Right 11/21/1997   with holmium laser    Medications:  Current Outpatient Prescriptions on File Prior to Visit  Medication Sig  . LORazepam (ATIVAN) 1 MG tablet Take 0.5-1 tablets (0.5-1 mg total) by mouth 2 (two) times daily as needed for anxiety.   No current facility-administered medications on file prior to visit.      Allergies:  Allergies  Allergen Reactions  . Urocit-K [Potassium Citrate] Nausea Only    Social History:  Social History   Social History  . Marital status: Married    Spouse name: N/A  . Number of children: N/A  . Years of education: N/A   Occupational History  . Not on file.   Social History Main Topics  . Smoking status: Current Every Day Smoker    Packs/day: 0.50  . Smokeless tobacco: Never Used  . Alcohol use 0.0 oz/week     Comment: occasional  . Drug use: No  . Sexual activity: Yes    Birth control/ protection: Surgical   Other Topics Concern  . Not on file   Social History Narrative  . No narrative on file   History  Smoking Status  . Current Every Day Smoker  . Packs/day: 0.50  Smokeless Tobacco  . Never Used   History  Alcohol Use  . 0.0 oz/week    Comment: occasional    Family History:  Family History  Problem Relation Age of Onset  . Kidney Stones Mother   . Ovarian cancer Mother   . Colon cancer Mother   . Lung cancer Father   .  Hyperlipidemia Sister   . Hypertension Sister   . Alzheimer's disease Maternal Grandmother   . Cancer Maternal Grandfather     Liver  . Alzheimer's disease Paternal Grandfather   . Hypertension Sister   . Hyperlipidemia Sister     Past medical history, surgical history, medications, allergies, family history and social history reviewed with patient today and changes made to appropriate areas of the chart.   Review of Systems  Constitutional: Positive for diaphoresis. Negative for chills, fever, malaise/fatigue and weight loss.  HENT: Negative.   Eyes: Negative.   Respiratory: Negative.   Cardiovascular: Negative.   Gastrointestinal: Positive for constipation and heartburn. Negative for abdominal pain, blood in stool, diarrhea, melena, nausea and vomiting.  Genitourinary: Negative.   Musculoskeletal: Negative.   Skin: Negative.   Neurological: Negative.  Negative for weakness.  Endo/Heme/Allergies:  Negative.   Psychiatric/Behavioral: Negative for depression, hallucinations, memory loss, substance abuse and suicidal ideas. The patient is nervous/anxious and has insomnia.     All other ROS negative except what is listed above and in the HPI.      Objective:    BP 97/70 (BP Location: Left Arm, Patient Position: Sitting, Cuff Size: Normal)   Pulse 84   Temp 97.9 F (36.6 C) (Oral)   Resp 17   Ht 5' 5.75" (1.67 m)   Wt 149 lb (67.6 kg)   SpO2 99%   BMI 24.23 kg/m   Wt Readings from Last 3 Encounters:  07/08/16 149 lb (67.6 kg)  06/04/16 152 lb (68.9 kg)  09/18/15 149 lb (67.6 kg)    Physical Exam  Constitutional: She is oriented to person, place, and time. She appears well-developed and well-nourished. No distress.  HENT:  Head: Normocephalic and atraumatic.  Right Ear: Hearing, tympanic membrane, external ear and ear canal normal.  Left Ear: Hearing, tympanic membrane, external ear and ear canal normal.  Nose: Nose normal.  Mouth/Throat: Uvula is midline, oropharynx is clear and moist and mucous membranes are normal. No oropharyngeal exudate.  Eyes: Conjunctivae, EOM and lids are normal. Pupils are equal, round, and reactive to light. Right eye exhibits no discharge. Left eye exhibits no discharge. No scleral icterus.  Neck: Normal range of motion. Neck supple. No JVD present. No tracheal deviation present. No thyromegaly present.  Cardiovascular: Normal rate, regular rhythm, normal heart sounds and intact distal pulses.  Exam reveals no gallop and no friction rub.   No murmur heard. Pulmonary/Chest: Effort normal and breath sounds normal. No stridor. No respiratory distress. She has no wheezes. She has no rales. She exhibits no tenderness.  Abdominal: Soft. Bowel sounds are normal. She exhibits no distension and no mass. There is no tenderness. There is no rebound and no guarding.  Genitourinary:  Genitourinary Comments: Breast and pelvic exams deferred today with shared  decision making  Musculoskeletal: Normal range of motion. She exhibits no edema, tenderness or deformity.  Lymphadenopathy:    She has no cervical adenopathy.  Neurological: She is alert and oriented to person, place, and time. She has normal reflexes. She displays normal reflexes. No cranial nerve deficit. She exhibits normal muscle tone. Coordination normal.  Skin: Skin is warm, dry and intact. No rash noted. She is not diaphoretic. No erythema. No pallor.  Psychiatric: She has a normal mood and affect. Her speech is normal and behavior is normal. Judgment and thought content normal. Cognition and memory are normal.  Nursing note and vitals reviewed.   Results for orders placed or performed in visit  on 09/18/15  Rapid strep screen (not at [facility])  Result Value Ref Range   Strep Gp A Ag, IA W/Reflex Negative Negative  Culture, Group A Strep  Result Value Ref Range   Strep A Culture Negative       Assessment & Plan:   Problem List Items Addressed This Visit    None    Visit Diagnoses    Routine general medical examination at a health care facility    -  Primary   Up to date on vaccines. Screening labs checked today. Mammo scheduled. Call with any concerns.    Relevant Orders   CBC with Differential/Platelet   Comprehensive metabolic panel   Lipid Panel w/o Chol/HDL Ratio   TSH   UA/M w/rflx Culture, Routine   Acute adjustment disorder with anxiety       Doing OK. Continue to monitor.    Vasovagal reaction       Due to having her blood drawn. Recovering well.        Follow up plan: Return in about 6 months (around 01/07/2017) for Follow up mood.   LABORATORY TESTING:  - Pap smear: not applicable  IMMUNIZATIONS:   - Tdap: Tetanus vaccination status reviewed: last tetanus booster within 10 years. - Influenza: Up to date - Pneumovax: Up to date - Prevnar: Not applicable - Zostavax vaccine: Not applicable  SCREENING: -Mammogram: Scheduled for April 23   PATIENT  COUNSELING:   Advised to take 1 mg of folate supplement per day if capable of pregnancy.   Sexuality: Discussed sexually transmitted diseases, partner selection, use of condoms, avoidance of unintended pregnancy  and contraceptive alternatives.   Advised to avoid cigarette smoking.  I discussed with the patient that most people either abstain from alcohol or drink within safe limits (<=14/week and <=4 drinks/occasion for males, <=7/weeks and <= 3 drinks/occasion for females) and that the risk for alcohol disorders and other health effects rises proportionally with the number of drinks per week and how often a drinker exceeds daily limits.  Discussed cessation/primary prevention of drug use and availability of treatment for abuse.   Diet: Encouraged to adjust caloric intake to maintain  or achieve ideal body weight, to reduce intake of dietary saturated fat and total fat, to limit sodium intake by avoiding high sodium foods and not adding table salt, and to maintain adequate dietary potassium and calcium preferably from fresh fruits, vegetables, and low-fat dairy products.    stressed the importance of regular exercise  Injury prevention: Discussed safety belts, safety helmets, smoke detector, smoking near bedding or upholstery.   Dental health: Discussed importance of regular tooth brushing, flossing, and dental visits.    NEXT PREVENTATIVE PHYSICAL DUE IN 1 YEAR. Return in about 6 months (around 01/07/2017) for Follow up mood.

## 2016-07-09 LAB — COMPREHENSIVE METABOLIC PANEL
ALK PHOS: 127 IU/L — AB (ref 39–117)
ALT: 24 IU/L (ref 0–32)
AST: 21 IU/L (ref 0–40)
Albumin/Globulin Ratio: 1.8 (ref 1.2–2.2)
Albumin: 4.4 g/dL (ref 3.5–5.5)
BUN/Creatinine Ratio: 23 (ref 9–23)
BUN: 17 mg/dL (ref 6–24)
Bilirubin Total: 0.3 mg/dL (ref 0.0–1.2)
CALCIUM: 9.5 mg/dL (ref 8.7–10.2)
CO2: 24 mmol/L (ref 18–29)
CREATININE: 0.73 mg/dL (ref 0.57–1.00)
Chloride: 101 mmol/L (ref 96–106)
GFR calc Af Amer: 112 mL/min/{1.73_m2} (ref >59)
GFR, EST NON AFRICAN AMERICAN: 97 mL/min/{1.73_m2} (ref >59)
GLOBULIN, TOTAL: 2.5 g/dL (ref 1.5–4.5)
Glucose: 75 mg/dL (ref 65–99)
POTASSIUM: 4.5 mmol/L (ref 3.5–5.2)
SODIUM: 141 mmol/L (ref 134–144)
Total Protein: 6.9 g/dL (ref 6.0–8.5)

## 2016-07-09 LAB — LIPID PANEL W/O CHOL/HDL RATIO
CHOLESTEROL TOTAL: 203 mg/dL — AB (ref 100–199)
HDL: 49 mg/dL (ref >39)
LDL CALC: 122 mg/dL — AB (ref 0–99)
TRIGLYCERIDES: 160 mg/dL — AB (ref 0–149)
VLDL Cholesterol Cal: 32 mg/dL (ref 5–40)

## 2016-07-09 LAB — CBC WITH DIFFERENTIAL/PLATELET
BASOS ABS: 0 10*3/uL (ref 0.0–0.2)
Basos: 0 %
EOS (ABSOLUTE): 0.1 10*3/uL (ref 0.0–0.4)
EOS: 2 %
HEMATOCRIT: 43.3 % (ref 34.0–46.6)
Hemoglobin: 14.7 g/dL (ref 11.1–15.9)
IMMATURE GRANULOCYTES: 0 %
Immature Grans (Abs): 0 10*3/uL (ref 0.0–0.1)
LYMPHS: 38 %
Lymphocytes Absolute: 2.2 10*3/uL (ref 0.7–3.1)
MCH: 32.2 pg (ref 26.6–33.0)
MCHC: 33.9 g/dL (ref 31.5–35.7)
MCV: 95 fL (ref 79–97)
MONOCYTES: 7 %
Monocytes Absolute: 0.4 10*3/uL (ref 0.1–0.9)
Neutrophils Absolute: 3 10*3/uL (ref 1.4–7.0)
Neutrophils: 53 %
PLATELETS: 310 10*3/uL (ref 150–379)
RBC: 4.57 x10E6/uL (ref 3.77–5.28)
RDW: 14.1 % (ref 12.3–15.4)
WBC: 5.6 10*3/uL (ref 3.4–10.8)

## 2016-07-09 LAB — TSH: TSH: 1.24 u[IU]/mL (ref 0.450–4.500)

## 2016-07-23 ENCOUNTER — Ambulatory Visit: Payer: BC Managed Care – PPO | Admitting: Family Medicine

## 2016-07-26 ENCOUNTER — Ambulatory Visit
Admission: RE | Admit: 2016-07-26 | Discharge: 2016-07-26 | Disposition: A | Discharge status: Home / Self Care | Payer: BC Managed Care – PPO | Source: Ambulatory Visit | Attending: Family Medicine | Admitting: Family Medicine

## 2016-07-26 DIAGNOSIS — N6489 Other specified disorders of breast: Secondary | ICD-10-CM | POA: Diagnosis not present

## 2016-07-26 DIAGNOSIS — Z1231 Encounter for screening mammogram for malignant neoplasm of breast: Secondary | ICD-10-CM

## 2016-07-26 DIAGNOSIS — R928 Other abnormal and inconclusive findings on diagnostic imaging of breast: Secondary | ICD-10-CM | POA: Diagnosis not present

## 2016-07-28 ENCOUNTER — Encounter: Payer: Self-pay | Admitting: Family Medicine

## 2016-07-28 ENCOUNTER — Other Ambulatory Visit: Payer: Self-pay | Admitting: Family Medicine

## 2016-07-28 DIAGNOSIS — N6489 Other specified disorders of breast: Secondary | ICD-10-CM

## 2016-07-28 DIAGNOSIS — R928 Other abnormal and inconclusive findings on diagnostic imaging of breast: Secondary | ICD-10-CM

## 2016-08-02 ENCOUNTER — Encounter: Payer: Self-pay | Admitting: Family Medicine

## 2016-08-02 ENCOUNTER — Ambulatory Visit (INDEPENDENT_AMBULATORY_CARE_PROVIDER_SITE_OTHER): Payer: BC Managed Care – PPO | Admitting: Family Medicine

## 2016-08-02 VITALS — BP 116/78 | HR 82 | Temp 98.1°F | Wt 150.4 lb

## 2016-08-02 DIAGNOSIS — J069 Acute upper respiratory infection, unspecified: Secondary | ICD-10-CM | POA: Diagnosis not present

## 2016-08-02 DIAGNOSIS — F4322 Adjustment disorder with anxiety: Secondary | ICD-10-CM

## 2016-08-02 DIAGNOSIS — Z0000 Encounter for general adult medical examination without abnormal findings: Secondary | ICD-10-CM

## 2016-08-02 MED ORDER — BENZONATATE 200 MG PO CAPS: 200.0000 mg | ORAL_CAPSULE | Freq: Three times a day (TID) | ORAL | 0 refills | Status: DC | PRN

## 2016-08-02 MED ORDER — LORAZEPAM 1 MG PO TABS: 0.5000 mg | ORAL_TABLET | Freq: Two times a day (BID) | ORAL | 1 refills | Status: DC | PRN

## 2016-08-02 MED ORDER — ALBUTEROL SULFATE HFA 108 (90 BASE) MCG/ACT IN AERS: 2.0000 | INHALATION_SPRAY | Freq: Four times a day (QID) | RESPIRATORY_TRACT | 0 refills | Status: DC | PRN

## 2016-08-02 NOTE — Progress Notes (Signed)
BP 116/78 (BP Location: Left Arm, Patient Position: Sitting, Cuff Size: Normal)   Pulse 82   Temp 98.1 F (36.7 C)   Wt 150 lb 6.4 oz (68.2 kg)   BMI 24.46 kg/m    Subjective:    Patient ID: Morgan Cisneros, female    DOB: 28-Sep-1965, 51 y.o.   MRN: [id number]  HPI: Morgan Cisneros is a 51 y.o. female  Chief Complaint  Patient presents with  . URI   UPPER RESPIRATORY TRACT INFECTION Duration: 2 weeks Worst symptom: cough Fever: yes Cough: yes Shortness of breath: no Wheezing: yes Chest pain: yes, with cough Chest tightness: no Chest congestion: yes Nasal congestion: no Runny nose: no Post nasal drip: yes Sneezing: no Sore throat: yes Swollen glands: no Sinus pressure: no Headache: yes Face pain: yes Toothache: yes Ear pain: no  Ear pressure: no  Eyes red/itching:no Eye drainage/crusting: no  Vomiting: no Rash: no Fatigue: no Sick contacts: yes Strep contacts: no  Context: better Recurrent sinusitis: no Relief with OTC cold/cough medications: yes  Treatments attempted: mucinex   Her father is on hospice and she is having a lot of issues. Lorazepam is helping. Just taking PRN. Needs a refill- still has several pills, but figured she'd ask while she's here.   Relevant past medical, surgical, family and social history reviewed and updated as indicated. Interim medical history since our last visit reviewed. Allergies and medications reviewed and updated.  Review of Systems  Constitutional: Negative.   HENT: Positive for congestion, postnasal drip, rhinorrhea and sore throat. Negative for dental problem, drooling, ear discharge, ear pain, facial swelling, hearing loss, mouth sores, nosebleeds, sinus pain, sinus pressure, sneezing, tinnitus, trouble swallowing and voice change.   Respiratory: Positive for cough and wheezing. Negative for apnea, choking, chest tightness, shortness of breath and stridor.   Cardiovascular: Negative.     Psychiatric/Behavioral: Negative.     Per HPI unless specifically indicated above     Objective:    BP 116/78 (BP Location: Left Arm, Patient Position: Sitting, Cuff Size: Normal)   Pulse 82   Temp 98.1 F (36.7 C)   Wt 150 lb 6.4 oz (68.2 kg)   BMI 24.46 kg/m   Wt Readings from Last 3 Encounters:  08/02/16 150 lb 6.4 oz (68.2 kg)  07/08/16 149 lb (67.6 kg)  06/04/16 152 lb (68.9 kg)    Physical Exam  Constitutional: She is oriented to person, place, and time. She appears well-developed and well-nourished. No distress.  HENT:  Head: Normocephalic and atraumatic.  Right Ear: Hearing and external ear normal.  Left Ear: Hearing and external ear normal.  Nose: Nose normal.  Mouth/Throat: Oropharynx is clear and moist. No oropharyngeal exudate.  Eyes: Conjunctivae, EOM and lids are normal. Pupils are equal, round, and reactive to light. Right eye exhibits no discharge. Left eye exhibits no discharge. No scleral icterus.  Neck: Normal range of motion. Neck supple. No JVD present. No tracheal deviation present. No thyromegaly present.  Cardiovascular: Normal rate, regular rhythm, normal heart sounds and intact distal pulses.  Exam reveals no gallop and no friction rub.   No murmur heard. Pulmonary/Chest: Effort normal and breath sounds normal. No stridor. No respiratory distress. She has no wheezes. She has no rales. She exhibits no tenderness.  Musculoskeletal: Normal range of motion.  Lymphadenopathy:    She has no cervical adenopathy.  Neurological: She is alert and oriented to person, place, and time.  Skin: Skin is warm, dry and intact.  No rash noted. She is not diaphoretic. No erythema. No pallor.  Psychiatric: She has a normal mood and affect. Her speech is normal and behavior is normal. Judgment and thought content normal. Cognition and memory are normal.  Nursing note and vitals reviewed.   Results for orders placed or performed in visit on 07/08/16  CBC with  Differential/Platelet  Result Value Ref Range   WBC 5.6 3.4 - 10.8 x10E3/uL   RBC 4.57 3.77 - 5.28 x10E6/uL   Hemoglobin 14.7 11.1 - 15.9 g/dL   Hematocrit 43.3 34.0 - 46.6 %   MCV 95 79 - 97 fL   MCH 32.2 26.6 - 33.0 pg   MCHC 33.9 31.5 - 35.7 g/dL   RDW 14.1 12.3 - 15.4 %   Platelets 310 150 - 379 x10E3/uL   Neutrophils 53 Not Estab. %   Lymphs 38 Not Estab. %   Monocytes 7 Not Estab. %   Eos 2 Not Estab. %   Basos 0 Not Estab. %   Neutrophils Absolute 3.0 1.4 - 7.0 x10E3/uL   Lymphocytes Absolute 2.2 0.7 - 3.1 x10E3/uL   Monocytes Absolute 0.4 0.1 - 0.9 x10E3/uL   EOS (ABSOLUTE) 0.1 0.0 - 0.4 x10E3/uL   Basophils Absolute 0.0 0.0 - 0.2 x10E3/uL   Immature Granulocytes 0 Not Estab. %   Immature Grans (Abs) 0.0 0.0 - 0.1 x10E3/uL  Comprehensive metabolic panel  Result Value Ref Range   Glucose 75 65 - 99 mg/dL   BUN 17 6 - 24 mg/dL   Creatinine, Ser 0.73 0.57 - 1.00 mg/dL   GFR calc non Af Amer 97 >59 mL/min/1.73   GFR calc Af Amer 112 >59 mL/min/1.73   BUN/Creatinine Ratio 23 9 - 23   Sodium 141 134 - 144 mmol/L   Potassium 4.5 3.5 - 5.2 mmol/L   Chloride 101 96 - 106 mmol/L   CO2 24 18 - 29 mmol/L   Calcium 9.5 8.7 - 10.2 mg/dL   Total Protein 6.9 6.0 - 8.5 g/dL   Albumin 4.4 3.5 - 5.5 g/dL   Globulin, Total 2.5 1.5 - 4.5 g/dL   Albumin/Globulin Ratio 1.8 1.2 - 2.2   Bilirubin Total 0.3 0.0 - 1.2 mg/dL   Alkaline Phosphatase 127 (H) 39 - 117 IU/L   AST 21 0 - 40 IU/L   ALT 24 0 - 32 IU/L  Lipid Panel w/o Chol/HDL Ratio  Result Value Ref Range   Cholesterol, Total 203 (H) 100 - 199 mg/dL   Triglycerides 160 (H) 0 - 149 mg/dL   HDL 49 >39 mg/dL   VLDL Cholesterol Cal 32 5 - 40 mg/dL   LDL Calculated 122 (H) 0 - 99 mg/dL  TSH  Result Value Ref Range   TSH 1.240 0.450 - 4.500 uIU/mL      Assessment & Plan:   Problem List Items Addressed This Visit    None    Visit Diagnoses    Viral upper respiratory tract infection    -  Primary   Seems to be resolving.  Lungs sound clear. Rx for tessalon and albuterol given if coughing fits return.    Routine general medical examination at a health care facility       Could not pee last visit- urine checked today.   Relevant Orders   UA/M w/rflx Culture, Routine   Acute adjustment disorder with anxiety       Doing well. Refill of her lorazepam given. Continue to monitor. Call with any concerns.  Follow up plan: Return if symptoms worsen or fail to improve.

## 2016-08-03 LAB — UA/M W/RFLX CULTURE, ROUTINE
Bilirubin, UA: NEGATIVE
GLUCOSE, UA: NEGATIVE
Ketones, UA: NEGATIVE
Leukocytes, UA: NEGATIVE
Nitrite, UA: NEGATIVE
PH UA: 6.5 (ref 5.0–7.5)
PROTEIN UA: NEGATIVE
Specific Gravity, UA: 1.025 (ref 1.005–1.030)
Urobilinogen, Ur: 0.2 mg/dL (ref 0.2–1.0)

## 2016-08-03 LAB — MICROSCOPIC EXAMINATION
BACTERIA UA: NONE SEEN
WBC, UA: NONE SEEN /hpf (ref 0–?)

## 2016-08-05 ENCOUNTER — Ambulatory Visit
Admission: RE | Admit: 2016-08-05 | Discharge: 2016-08-05 | Disposition: A | Discharge status: Home / Self Care | Payer: BC Managed Care – PPO | Source: Ambulatory Visit | Attending: Family Medicine | Admitting: Family Medicine

## 2016-08-05 ENCOUNTER — Other Ambulatory Visit: Payer: Self-pay | Admitting: Family Medicine

## 2016-08-05 ENCOUNTER — Telehealth: Payer: Self-pay | Admitting: Family Medicine

## 2016-08-05 DIAGNOSIS — N6489 Other specified disorders of breast: Secondary | ICD-10-CM | POA: Diagnosis present

## 2016-08-05 DIAGNOSIS — N6320 Unspecified lump in the left breast, unspecified quadrant: Secondary | ICD-10-CM

## 2016-08-05 DIAGNOSIS — R928 Other abnormal and inconclusive findings on diagnostic imaging of breast: Secondary | ICD-10-CM

## 2016-08-05 DIAGNOSIS — N6323 Unspecified lump in the left breast, lower outer quadrant: Secondary | ICD-10-CM | POA: Insufficient documentation

## 2016-08-05 NOTE — Telephone Encounter (Signed)
Called and discussed results with Lilliane. Discussed options of having breast center do biopsy or going to Dr. Bary Castilla or Jamal Collin- she would like breast center to do the biopsy and then to be referred after that if need be.

## 2016-08-10 ENCOUNTER — Ambulatory Visit
Admission: RE | Admit: 2016-08-10 | Discharge: 2016-08-10 | Disposition: A | Discharge status: Home / Self Care | Payer: BC Managed Care – PPO | Source: Ambulatory Visit | Attending: Family Medicine | Admitting: Family Medicine

## 2016-08-10 DIAGNOSIS — N6320 Unspecified lump in the left breast, unspecified quadrant: Secondary | ICD-10-CM

## 2016-08-10 DIAGNOSIS — R928 Other abnormal and inconclusive findings on diagnostic imaging of breast: Secondary | ICD-10-CM | POA: Diagnosis present

## 2016-08-10 DIAGNOSIS — N6082 Other benign mammary dysplasias of left breast: Secondary | ICD-10-CM | POA: Diagnosis not present

## 2016-08-10 HISTORY — PX: BREAST BIOPSY: SHX20

## 2016-08-11 LAB — SURGICAL PATHOLOGY

## 2016-08-13 ENCOUNTER — Encounter: Payer: Self-pay | Admitting: Family Medicine

## 2016-08-13 NOTE — Telephone Encounter (Signed)
Routing to provider  

## 2016-08-30 ENCOUNTER — Encounter: Payer: Self-pay | Admitting: Emergency Medicine

## 2016-08-30 ENCOUNTER — Emergency Department
Admission: EM | Admit: 2016-08-30 | Discharge: 2016-08-30 | Disposition: A | Discharge status: Home / Self Care | Payer: BC Managed Care – PPO | Attending: Emergency Medicine | Admitting: Emergency Medicine

## 2016-08-30 DIAGNOSIS — S9031XA Contusion of right foot, initial encounter: Secondary | ICD-10-CM | POA: Diagnosis present

## 2016-08-30 DIAGNOSIS — Y92410 Unspecified street and highway as the place of occurrence of the external cause: Secondary | ICD-10-CM | POA: Diagnosis not present

## 2016-08-30 DIAGNOSIS — Y999 Unspecified external cause status: Secondary | ICD-10-CM | POA: Insufficient documentation

## 2016-08-30 DIAGNOSIS — Z79899 Other long term (current) drug therapy: Secondary | ICD-10-CM | POA: Diagnosis not present

## 2016-08-30 DIAGNOSIS — Y939 Activity, unspecified: Secondary | ICD-10-CM | POA: Diagnosis not present

## 2016-08-30 DIAGNOSIS — F17200 Nicotine dependence, unspecified, uncomplicated: Secondary | ICD-10-CM | POA: Insufficient documentation

## 2016-08-30 DIAGNOSIS — M542 Cervicalgia: Secondary | ICD-10-CM | POA: Diagnosis not present

## 2016-08-30 DIAGNOSIS — M7918 Myalgia, other site: Secondary | ICD-10-CM
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
## 2016-09-01 ENCOUNTER — Encounter: Payer: Self-pay | Admitting: Family Medicine

## 2016-09-01 ENCOUNTER — Other Ambulatory Visit: Payer: Self-pay | Admitting: Family Medicine

## 2016-09-08 ENCOUNTER — Encounter: Payer: Self-pay | Admitting: Family Medicine

## 2016-09-08 MED ORDER — ESOMEPRAZOLE MAGNESIUM 20 MG PO CPDR: 20.0000 mg | DELAYED_RELEASE_CAPSULE | Freq: Every day | ORAL | 3 refills | Status: DC

## 2016-09-24 ENCOUNTER — Ambulatory Visit (INDEPENDENT_AMBULATORY_CARE_PROVIDER_SITE_OTHER): Payer: BC Managed Care – PPO | Admitting: Family Medicine

## 2016-09-24 ENCOUNTER — Encounter: Payer: Self-pay | Admitting: Family Medicine

## 2016-09-24 VITALS — BP 121/87 | HR 99 | Wt 147.0 lb

## 2016-09-24 DIAGNOSIS — R197 Diarrhea, unspecified: Secondary | ICD-10-CM | POA: Diagnosis not present

## 2016-09-24 DIAGNOSIS — R110 Nausea: Secondary | ICD-10-CM | POA: Diagnosis not present

## 2016-09-24 LAB — CBC WITH DIFFERENTIAL/PLATELET
HEMATOCRIT: 46.6 % (ref 34.0–46.6)
HEMOGLOBIN: 15.5 g/dL (ref 11.1–15.9)
LYMPHS: 21 %
Lymphocytes Absolute: 2.2 10*3/uL (ref 0.7–3.1)
MCH: 32.2 pg (ref 26.6–33.0)
MCHC: 33.3 g/dL (ref 31.5–35.7)
MCV: 97 fL (ref 79–97)
MID (Absolute): 0.5 10*3/uL (ref 0.1–1.6)
MID: 5 %
NEUTROS PCT: 74 %
Neutrophils Absolute: 7.5 10*3/uL — ABNORMAL HIGH (ref 1.4–7.0)
Platelets: 310 10*3/uL (ref 150–379)
RBC: 4.81 x10E6/uL (ref 3.77–5.28)
RDW: 14.9 % (ref 12.3–15.4)
WBC: 10.2 10*3/uL (ref 3.4–10.8)

## 2016-09-24 LAB — UA/M W/RFLX CULTURE, ROUTINE
Bilirubin, UA: NEGATIVE
Glucose, UA: NEGATIVE
Leukocytes, UA: NEGATIVE
Nitrite, UA: NEGATIVE
PH UA: 6 (ref 5.0–7.5)
PROTEIN UA: NEGATIVE
Specific Gravity, UA: 1.025 (ref 1.005–1.030)
UUROB: 0.2 mg/dL (ref 0.2–1.0)

## 2016-09-24 LAB — MICROSCOPIC EXAMINATION

## 2016-09-24 MED ORDER — METRONIDAZOLE 500 MG PO TABS: 500.0000 mg | ORAL_TABLET | Freq: Four times a day (QID) | ORAL | 0 refills | Status: DC

## 2016-09-24 MED ORDER — FLUCONAZOLE 150 MG PO TABS: 150.0000 mg | ORAL_TABLET | Freq: Once | ORAL | 1 refills | Status: AC

## 2016-09-24 MED ORDER — CIPROFLOXACIN HCL 750 MG PO TABS: 750.0000 mg | ORAL_TABLET | Freq: Two times a day (BID) | ORAL | 0 refills | Status: DC

## 2016-09-24 NOTE — Progress Notes (Signed)
   BP 121/87   Pulse 99   Wt 147 lb (66.7 kg)   SpO2 100%   BMI 24.46 kg/m    Subjective:    Patient ID: Morgan Cisneros, female    DOB: 07/13/1965, 51 y.o.   MRN: [id number]  HPI: Morgan Cisneros is a 51 y.o. female  Chief Complaint  Patient presents with  . Abdominal Pain  . Diarrhea   Patient presents with over 2 weeks of b/l lower abdominal pain/cramping and diarrhea. Denies fevers, vomiting, dark or bright red stools. Just started getting nauseated today. Taking pepto bismol with no relief. Hx of diverticulitis 4-5 years ago and states this feels similar. Denies sick contacts, recent travel, new foods.   Relevant past medical, surgical, family and social history reviewed and updated as indicated. Interim medical history since our last visit reviewed. Allergies and medications reviewed and updated.  Review of Systems  Constitutional: Negative.   HENT: Negative.   Respiratory: Negative.   Cardiovascular: Negative.   Gastrointestinal: Positive for abdominal pain and diarrhea.  Genitourinary: Negative.   Musculoskeletal: Negative.   Neurological: Negative.   Psychiatric/Behavioral: Negative.     Per HPI unless specifically indicated above     Objective:    BP 121/87   Pulse 99   Wt 147 lb (66.7 kg)   SpO2 100%   BMI 24.46 kg/m   Wt Readings from Last 3 Encounters:  09/24/16 147 lb (66.7 kg)  08/30/16 150 lb (68 kg)  08/02/16 150 lb 6.4 oz (68.2 kg)    Physical Exam  Constitutional: She is oriented to person, place, and time. She appears well-developed and well-nourished. No distress.  HENT:  Head: Atraumatic.  Eyes: Conjunctivae are normal. Pupils are equal, round, and reactive to light. No scleral icterus.  Neck: Normal range of motion. Neck supple.  Cardiovascular: Normal rate and normal heart sounds.   Pulmonary/Chest: Effort normal and breath sounds normal.  Abdominal: Soft. Bowel sounds are normal. She exhibits no mass. There is tenderness (Mild  b/l lower abdominal ttp). There is no guarding.  Musculoskeletal: Normal range of motion.  Neurological: She is alert and oriented to person, place, and time.  Skin: Skin is warm and dry.  Psychiatric: She has a normal mood and affect. Her behavior is normal.  Nursing note and vitals reviewed.     Assessment & Plan:   Problem List Items Addressed This Visit    None    Visit Diagnoses    Diarrhea, unspecified type    -  Primary   Relevant Orders   Comprehensive metabolic panel (Completed)   Lipase (Completed)   CBC With Differential/Platelet (Completed)   UA/M w/rflx Culture, Routine (Completed)   Nausea       Relevant Orders   Comprehensive metabolic panel (Completed)   Lipase (Completed)    CBC high-normal,  U/A WNL. Await lipase and CMP. Discussed that results won't come back until Monday when office reopens and to go to UC or ER with any worsening or severe sxs over weekend. Given hx of diverticulitis, consistent sxs, and WBCs being above her usual baseline, will start cipro and flagyl as precaution. Diflucan sent as she states she gets yeast infections with abx. F/u if worsening or no improvement by Monday.   Follow up plan: Return if symptoms worsen or fail to improve.

## 2016-09-25 LAB — COMPREHENSIVE METABOLIC PANEL
ALK PHOS: 138 IU/L — AB (ref 39–117)
ALT: 19 IU/L (ref 0–32)
AST: 20 IU/L (ref 0–40)
Albumin/Globulin Ratio: 1.8 (ref 1.2–2.2)
Albumin: 4.4 g/dL (ref 3.5–5.5)
BUN/Creatinine Ratio: 23 (ref 9–23)
BUN: 18 mg/dL (ref 6–24)
Bilirubin Total: 0.2 mg/dL (ref 0.0–1.2)
CHLORIDE: 102 mmol/L (ref 96–106)
CO2: 22 mmol/L (ref 20–29)
Calcium: 9.5 mg/dL (ref 8.7–10.2)
Creatinine, Ser: 0.79 mg/dL (ref 0.57–1.00)
GFR calc Af Amer: 102 mL/min/{1.73_m2} (ref >59)
GFR calc non Af Amer: 88 mL/min/{1.73_m2} (ref >59)
GLUCOSE: 113 mg/dL — AB (ref 65–99)
Globulin, Total: 2.5 g/dL (ref 1.5–4.5)
Potassium: 4.1 mmol/L (ref 3.5–5.2)
Sodium: 142 mmol/L (ref 134–144)
Total Protein: 6.9 g/dL (ref 6.0–8.5)

## 2016-09-25 LAB — LIPASE: LIPASE: 89 U/L — AB (ref 14–72)

## 2016-09-28 ENCOUNTER — Ambulatory Visit: Payer: Self-pay | Admitting: Family Medicine

## 2016-09-28 ENCOUNTER — Telehealth: Payer: Self-pay | Admitting: Family Medicine

## 2016-09-28 MED ORDER — PROMETHAZINE HCL 25 MG PO TABS: 25.0000 mg | ORAL_TABLET | Freq: Three times a day (TID) | ORAL | 0 refills | Status: DC | PRN

## 2016-09-28 NOTE — Telephone Encounter (Signed)
Please call and let her know her pancreas enzyme was still up about where it was when last checked 4 years ago which could be just due to her acute illness or possibly any alcohol use recently. We can recheck in about a month once her other symptoms dissipate.   Also, I have sent in some phenergan for her. Remind her that it will likely make her very sleepy so to be cautious with it. Eat a good meal with the abx to help reduce nausea. Follow up if sxs aren't improving.

## 2016-09-28 NOTE — Telephone Encounter (Signed)
Patient states her medications Flagyl and Cipro are making her feel nauseous. Patient would like to try Phenergan medication that was offered during her last visit.   Please Advise.  Thank you

## 2016-09-28 NOTE — Telephone Encounter (Signed)
Patient notified of her lab results and to recheck in 1 month. Also informed her that the rx was called in but cautioned her on the side effects of making her sleepy. She understood. I advised her to call us back if she is not improving or gets worse.

## 2016-09-28 NOTE — Telephone Encounter (Signed)
error 

## 2016-09-28 NOTE — Telephone Encounter (Signed)
Routing to provider  

## 2016-09-30 ENCOUNTER — Other Ambulatory Visit: Payer: Self-pay | Admitting: Family Medicine

## 2016-09-30 ENCOUNTER — Encounter: Payer: Self-pay | Admitting: Family Medicine

## 2016-10-01 NOTE — Telephone Encounter (Signed)
Routing to provider. Appt on 01/13/17. 

## 2016-10-01 NOTE — Telephone Encounter (Signed)
RX called into pharmacy

## 2016-10-01 NOTE — Telephone Encounter (Signed)
OK to call in

## 2016-10-03 DIAGNOSIS — A0472 Enterocolitis due to Clostridium difficile, not specified as recurrent: Secondary | ICD-10-CM

## 2016-10-03 DIAGNOSIS — K5792 Diverticulitis of intestine, part unspecified, without perforation or abscess without bleeding: Secondary | ICD-10-CM

## 2016-10-03 HISTORY — DX: Enterocolitis due to Clostridium difficile, not specified as recurrent: A04.72

## 2016-10-03 HISTORY — DX: Diverticulitis of intestine, part unspecified, without perforation or abscess without bleeding: K57.92

## 2016-10-14 ENCOUNTER — Ambulatory Visit (INDEPENDENT_AMBULATORY_CARE_PROVIDER_SITE_OTHER): Payer: BC Managed Care – PPO | Admitting: Family Medicine

## 2016-10-14 ENCOUNTER — Telehealth: Payer: Self-pay | Admitting: Family Medicine

## 2016-10-14 ENCOUNTER — Encounter: Payer: Self-pay | Admitting: Family Medicine

## 2016-10-14 ENCOUNTER — Ambulatory Visit
Admission: RE | Admit: 2016-10-14 | Discharge: 2016-10-14 | Disposition: A | Discharge status: Home / Self Care | Payer: BC Managed Care – PPO | Source: Ambulatory Visit | Attending: Family Medicine | Admitting: Family Medicine

## 2016-10-14 VITALS — BP 119/86 | HR 98 | Temp 98.4°F | Wt 144.2 lb

## 2016-10-14 DIAGNOSIS — R1032 Left lower quadrant pain: Secondary | ICD-10-CM

## 2016-10-14 DIAGNOSIS — I700 Atherosclerosis of aorta: Secondary | ICD-10-CM | POA: Insufficient documentation

## 2016-10-14 DIAGNOSIS — K529 Noninfective gastroenteritis and colitis, unspecified: Secondary | ICD-10-CM | POA: Insufficient documentation

## 2016-10-14 DIAGNOSIS — K5792 Diverticulitis of intestine, part unspecified, without perforation or abscess without bleeding: Secondary | ICD-10-CM

## 2016-10-14 DIAGNOSIS — N200 Calculus of kidney: Secondary | ICD-10-CM | POA: Diagnosis not present

## 2016-10-14 DIAGNOSIS — K5730 Diverticulosis of large intestine without perforation or abscess without bleeding: Secondary | ICD-10-CM | POA: Diagnosis not present

## 2016-10-14 DIAGNOSIS — K5720 Diverticulitis of large intestine with perforation and abscess without bleeding: Secondary | ICD-10-CM

## 2016-10-14 LAB — CBC WITH DIFFERENTIAL/PLATELET
HEMOGLOBIN: 14.1 g/dL (ref 11.1–15.9)
Hematocrit: 42.8 % (ref 34.0–46.6)
LYMPHS ABS: 1.7 10*3/uL (ref 0.7–3.1)
Lymphs: 20 %
MCH: 32.2 pg (ref 26.6–33.0)
MCHC: 32.9 g/dL (ref 31.5–35.7)
MCV: 98 fL — ABNORMAL HIGH (ref 79–97)
MID (ABSOLUTE): 0.3 10*3/uL (ref 0.1–1.6)
MID: 4 %
NEUTROS ABS: 6.8 10*3/uL (ref 1.4–7.0)
Neutrophils: 76 %
PLATELETS: 316 10*3/uL (ref 150–379)
RBC: 4.38 x10E6/uL (ref 3.77–5.28)
RDW: 14.2 % (ref 12.3–15.4)
WBC: 8.8 10*3/uL (ref 3.4–10.8)

## 2016-10-14 LAB — UA/M W/RFLX CULTURE, ROUTINE
BILIRUBIN UA: NEGATIVE
GLUCOSE, UA: NEGATIVE
KETONES UA: NEGATIVE
Leukocytes, UA: NEGATIVE
NITRITE UA: NEGATIVE
Protein, UA: NEGATIVE
SPEC GRAV UA: 1.01 (ref 1.005–1.030)
UUROB: 0.2 mg/dL (ref 0.2–1.0)
pH, UA: 7 (ref 5.0–7.5)

## 2016-10-14 LAB — MICROSCOPIC EXAMINATION

## 2016-10-14 MED ORDER — CIPROFLOXACIN HCL 500 MG PO TABS: 500.0000 mg | ORAL_TABLET | Freq: Two times a day (BID) | ORAL | 0 refills | Status: DC

## 2016-10-14 MED ORDER — IOPAMIDOL (ISOVUE-300) INJECTION 61%
100.0000 mL | Freq: Once | INTRAVENOUS | Status: AC | PRN
  Administered 2016-10-14: 100 mL via INTRAVENOUS

## 2016-10-14 MED ORDER — METRONIDAZOLE 500 MG PO TABS: 500.0000 mg | ORAL_TABLET | Freq: Two times a day (BID) | ORAL | 0 refills | Status: DC

## 2016-10-14 NOTE — Telephone Encounter (Signed)
Patient needs precertification for CT at [facility] regional appointment today.   Please Advise.  Thank you

## 2016-10-14 NOTE — Progress Notes (Signed)
BP 119/86 (BP Location: Left Arm, Patient Position: Sitting, Cuff Size: Normal)   Pulse 98   Temp 98.4 F (36.9 C)   Wt 144 lb 4 oz (65.4 kg)   SpO2 98%   BMI 24.00 kg/m    Subjective:    Patient ID: Morgan Cisneros, female    DOB: 1966-01-06, 51 y.o.   MRN: [id number]  HPI: Morgan Cisneros is a 51 y.o. female  Chief Complaint  Patient presents with  . Diverticulitis    Patient states that she finished the antibiotics, but the pain is back again   ABDOMINAL PAIN- came in on 6/22 with 2 weeks of LLQ pain, Took the abx- felt better after a couple of days, finished the abx and about a week later started feeling terrible again. Now not able to eat due to pain. No fevers, no blood in her stools. + diarrhea  Duration: 4-5 days, after being better for about week Onset: sudden Severity: severe Quality: cramping pain, knife-like Location:  L side and lower quadrants  Episode duration: minutes Radiation: no Frequency: intermittent, several times a day Alleviating factors: tylenol helps, antibiotics Aggravating factors: eating Status: worse Treatments attempted: antibiotics, probiotics Fever: no Nausea: yes Vomiting: no Weight loss: no Decreased appetite: yes Diarrhea: yes Constipation: yes Blood in stool: no Heartburn: yes Jaundice: no Rash: no Dysuria/urinary frequency: no Hematuria: no- history of kidney stones, feels like what she's had before History of sexually transmitted disease: no Recurrent NSAID use: no  Relevant past medical, surgical, family and social history reviewed and updated as indicated. Interim medical history since our last visit reviewed. Allergies and medications reviewed and updated.  Review of Systems  Constitutional: Negative.   Respiratory: Negative.   Cardiovascular: Negative.   Gastrointestinal: Positive for abdominal pain, constipation, diarrhea and nausea. Negative for abdominal distention, anal bleeding, blood in stool, rectal  pain and vomiting.  Genitourinary: Positive for flank pain. Negative for decreased urine volume, difficulty urinating, dyspareunia, dysuria, enuresis, frequency, genital sores, hematuria, menstrual problem, pelvic pain, urgency, vaginal bleeding, vaginal discharge and vaginal pain.  Psychiatric/Behavioral: Negative.     Per HPI unless specifically indicated above     Objective:    BP 119/86 (BP Location: Left Arm, Patient Position: Sitting, Cuff Size: Normal)   Pulse 98   Temp 98.4 F (36.9 C)   Wt 144 lb 4 oz (65.4 kg)   SpO2 98%   BMI 24.00 kg/m   Wt Readings from Last 3 Encounters:  10/14/16 144 lb 4 oz (65.4 kg)  09/24/16 147 lb (66.7 kg)  08/30/16 150 lb (68 kg)    Physical Exam  Constitutional: She is oriented to person, place, and time. She appears well-developed and well-nourished. No distress.  HENT:  Head: Normocephalic and atraumatic.  Right Ear: Hearing normal.  Left Ear: Hearing normal.  Nose: Nose normal.  Eyes: Conjunctivae and lids are normal. Right eye exhibits no discharge. Left eye exhibits no discharge. No scleral icterus.  Cardiovascular: Normal rate, regular rhythm, normal heart sounds and intact distal pulses.  Exam reveals no gallop and no friction rub.   No murmur heard. Pulmonary/Chest: Effort normal and breath sounds normal. No respiratory distress. She has no wheezes. She has no rales. She exhibits no tenderness.  Abdominal: Soft. Bowel sounds are normal. There is tenderness in the left upper quadrant and left lower quadrant. There is CVA tenderness (on the R).  Musculoskeletal: Normal range of motion.  Neurological: She is alert and oriented to person,  place, and time.  Skin: Skin is intact. No rash noted. She is not diaphoretic.  Psychiatric: She has a normal mood and affect. Her speech is normal and behavior is normal. Judgment and thought content normal. Cognition and memory are normal.  Nursing note and vitals reviewed.   Results for orders  placed or performed in visit on 09/24/16  Microscopic Examination  Result Value Ref Range   RBC, UA 3-10 (A) 0 - 2 /hpf   Epithelial Cells (non renal) 0-10 0 - 10 /hpf  Comprehensive metabolic panel  Result Value Ref Range   Glucose 113 (H) 65 - 99 mg/dL   BUN 18 6 - 24 mg/dL   Creatinine, Ser 0.79 0.57 - 1.00 mg/dL   GFR calc non Af Amer 88 >59 mL/min/1.73   GFR calc Af Amer 102 >59 mL/min/1.73   BUN/Creatinine Ratio 23 9 - 23   Sodium 142 134 - 144 mmol/L   Potassium 4.1 3.5 - 5.2 mmol/L   Chloride 102 96 - 106 mmol/L   CO2 22 20 - 29 mmol/L   Calcium 9.5 8.7 - 10.2 mg/dL   Total Protein 6.9 6.0 - 8.5 g/dL   Albumin 4.4 3.5 - 5.5 g/dL   Globulin, Total 2.5 1.5 - 4.5 g/dL   Albumin/Globulin Ratio 1.8 1.2 - 2.2   Bilirubin Total 0.2 0.0 - 1.2 mg/dL   Alkaline Phosphatase 138 (H) 39 - 117 IU/L   AST 20 0 - 40 IU/L   ALT 19 0 - 32 IU/L  Lipase  Result Value Ref Range   Lipase 89 (H) 14 - 72 U/L  CBC With Differential/Platelet  Result Value Ref Range   WBC 10.2 3.4 - 10.8 x10E3/uL   RBC 4.81 3.77 - 5.28 x10E6/uL   Hemoglobin 15.5 11.1 - 15.9 g/dL   Hematocrit 46.6 34.0 - 46.6 %   MCV 97 79 - 97 fL   MCH 32.2 26.6 - 33.0 pg   MCHC 33.3 31.5 - 35.7 g/dL   RDW 14.9 12.3 - 15.4 %   Platelets 310 150 - 379 x10E3/uL   Neutrophils 74 Not Estab. %   Lymphs 21 Not Estab. %   MID 5 Not Estab. %   Neutrophils Absolute 7.5 (H) 1.4 - 7.0 x10E3/uL   Lymphocytes Absolute 2.2 0.7 - 3.1 x10E3/uL   MID (Absolute) 0.5 0.1 - 1.6 X10E3/uL  UA/M w/rflx Culture, Routine  Result Value Ref Range   Specific Gravity, UA 1.025 1.005 - 1.030   pH, UA 6.0 5.0 - 7.5   Color, UA Yellow Yellow   Appearance Ur Clear Clear   Leukocytes, UA Negative Negative   Protein, UA Negative Negative/Trace   Glucose, UA Negative Negative   Ketones, UA Trace (A) Negative   RBC, UA 2+ (A) Negative   Bilirubin, UA Negative Negative   Urobilinogen, Ur 0.2 0.2 - 1.0 mg/dL   Nitrite, UA Negative Negative    Microscopic Examination See below:       Assessment & Plan:   Problem List Items Addressed This Visit    None    Visit Diagnoses    LLQ pain    -  Primary   Patient has hx of diverticulitis and kidney stones. Will check labs (WBC better) and check stat CT abdomen/Pelvis- await results and treat as needed.    Relevant Orders   CBC With Differential/Platelet   UA/M w/rflx Culture, Routine   CT Abdomen Pelvis W Contrast       Follow up  plan: Return Pending results. Marland Kitchen

## 2016-10-14 NOTE — Telephone Encounter (Signed)
Called Morgan Cisneros with the results of her CT. +For diverticulitis with tiny diverticular abscess. No perforation. Will treat with cipro and flagyl. Will get her into see surgery ASAP. Warning signs discussed for which she should go to the ER. She is aware. Pain controlled with tylenol right now. Continue to monitor.

## 2016-10-14 NOTE — Telephone Encounter (Signed)
Authorized. Josem Kaufmann #[id number]

## 2016-10-19 ENCOUNTER — Encounter: Payer: Self-pay | Admitting: Emergency Medicine

## 2016-10-19 ENCOUNTER — Inpatient Hospital Stay
Admission: EM | Admit: 2016-10-19 | Discharge: 2016-10-26 | DRG: 391 | Disposition: A | Discharge status: Home / Self Care | Payer: BC Managed Care – PPO | Attending: General Surgery | Admitting: General Surgery

## 2016-10-19 ENCOUNTER — Emergency Department: Payer: BC Managed Care – PPO

## 2016-10-19 DIAGNOSIS — K5732 Diverticulitis of large intestine without perforation or abscess without bleeding: Secondary | ICD-10-CM

## 2016-10-19 DIAGNOSIS — G8929 Other chronic pain: Secondary | ICD-10-CM | POA: Diagnosis present

## 2016-10-19 DIAGNOSIS — A0472 Enterocolitis due to Clostridium difficile, not specified as recurrent: Secondary | ICD-10-CM | POA: Diagnosis present

## 2016-10-19 DIAGNOSIS — Z6823 Body mass index (BMI) 23.0-23.9, adult: Secondary | ICD-10-CM

## 2016-10-19 DIAGNOSIS — K5900 Constipation, unspecified: Secondary | ICD-10-CM | POA: Diagnosis present

## 2016-10-19 DIAGNOSIS — Z888 Allergy status to other drugs, medicaments and biological substances status: Secondary | ICD-10-CM

## 2016-10-19 DIAGNOSIS — M546 Pain in thoracic spine: Secondary | ICD-10-CM | POA: Diagnosis present

## 2016-10-19 DIAGNOSIS — N83209 Unspecified ovarian cyst, unspecified side: Secondary | ICD-10-CM | POA: Diagnosis present

## 2016-10-19 DIAGNOSIS — R109 Unspecified abdominal pain: Secondary | ICD-10-CM

## 2016-10-19 DIAGNOSIS — Z79899 Other long term (current) drug therapy: Secondary | ICD-10-CM

## 2016-10-19 DIAGNOSIS — G4700 Insomnia, unspecified: Secondary | ICD-10-CM | POA: Diagnosis present

## 2016-10-19 DIAGNOSIS — E43 Unspecified severe protein-calorie malnutrition: Secondary | ICD-10-CM | POA: Diagnosis present

## 2016-10-19 DIAGNOSIS — E876 Hypokalemia: Secondary | ICD-10-CM | POA: Diagnosis present

## 2016-10-19 DIAGNOSIS — Z90710 Acquired absence of both cervix and uterus: Secondary | ICD-10-CM | POA: Diagnosis not present

## 2016-10-19 DIAGNOSIS — K5792 Diverticulitis of intestine, part unspecified, without perforation or abscess without bleeding: Secondary | ICD-10-CM | POA: Diagnosis not present

## 2016-10-19 DIAGNOSIS — F17210 Nicotine dependence, cigarettes, uncomplicated: Secondary | ICD-10-CM | POA: Diagnosis present

## 2016-10-19 DIAGNOSIS — N200 Calculus of kidney: Secondary | ICD-10-CM | POA: Diagnosis present

## 2016-10-19 LAB — CBC
HEMATOCRIT: 46.4 % (ref 35.0–47.0)
Hemoglobin: 15.8 g/dL (ref 12.0–16.0)
MCH: 31.8 pg (ref 26.0–34.0)
MCHC: 34.1 g/dL (ref 32.0–36.0)
MCV: 93.1 fL (ref 80.0–100.0)
PLATELETS: 396 10*3/uL (ref 150–440)
RBC: 4.98 MIL/uL (ref 3.80–5.20)
RDW: 13.8 % (ref 11.5–14.5)
WBC: 12.6 10*3/uL — AB (ref 3.6–11.0)

## 2016-10-19 LAB — URINALYSIS, COMPLETE (UACMP) WITH MICROSCOPIC
BILIRUBIN URINE: NEGATIVE
Glucose, UA: NEGATIVE mg/dL
KETONES UR: 20 mg/dL — AB
NITRITE: NEGATIVE
PH: 6 (ref 5.0–8.0)
Protein, ur: NEGATIVE mg/dL
Specific Gravity, Urine: 1.009 (ref 1.005–1.030)

## 2016-10-19 LAB — COMPREHENSIVE METABOLIC PANEL
ALT: 42 U/L (ref 14–54)
AST: 29 U/L (ref 15–41)
Albumin: 4.3 g/dL (ref 3.5–5.0)
Alkaline Phosphatase: 133 U/L — ABNORMAL HIGH (ref 38–126)
Anion gap: 10 (ref 5–15)
BUN: 9 mg/dL (ref 6–20)
CHLORIDE: 106 mmol/L (ref 101–111)
CO2: 25 mmol/L (ref 22–32)
CREATININE: 0.66 mg/dL (ref 0.44–1.00)
Calcium: 9.7 mg/dL (ref 8.9–10.3)
Glucose, Bld: 90 mg/dL (ref 65–99)
POTASSIUM: 4 mmol/L (ref 3.5–5.1)
Sodium: 141 mmol/L (ref 135–145)
TOTAL PROTEIN: 7.7 g/dL (ref 6.5–8.1)
Total Bilirubin: 0.6 mg/dL (ref 0.3–1.2)

## 2016-10-19 LAB — HCG, QUANTITATIVE, PREGNANCY: hCG, Beta Chain, Quant, S: 3 m[IU]/mL (ref <5)

## 2016-10-19 LAB — LACTIC ACID, PLASMA: LACTIC ACID, VENOUS: 1.1 mmol/L (ref 0.5–1.9)

## 2016-10-19 LAB — LIPASE, BLOOD: LIPASE: 26 U/L (ref 11–51)

## 2016-10-19 MED ORDER — ONDANSETRON HCL 4 MG/2ML IJ SOLN
INTRAMUSCULAR | Status: AC
  Administered 2016-10-19: 4 mg via INTRAVENOUS
  Filled 2016-10-19: qty 2

## 2016-10-19 MED ORDER — IOPAMIDOL (ISOVUE-300) INJECTION 61%
100.0000 mL | Freq: Once | INTRAVENOUS | Status: AC | PRN
  Administered 2016-10-19: 100 mL via INTRAVENOUS
  Filled 2016-10-19: qty 100

## 2016-10-19 MED ORDER — SODIUM CHLORIDE 0.9 % IV BOLUS (SEPSIS)
1000.0000 mL | Freq: Once | INTRAVENOUS | Status: AC
  Administered 2016-10-19: 1000 mL via INTRAVENOUS

## 2016-10-19 MED ORDER — FENTANYL CITRATE (PF) 100 MCG/2ML IJ SOLN
INTRAMUSCULAR | Status: AC
  Administered 2016-10-19: 75 ug via INTRAVENOUS
  Filled 2016-10-19: qty 2

## 2016-10-19 MED ORDER — HYDROMORPHONE HCL 1 MG/ML IJ SOLN
0.2500 mg | INTRAMUSCULAR | Status: DC | PRN
  Administered 2016-10-19 – 2016-10-20 (×3): 0.25 mg via INTRAVENOUS
  Filled 2016-10-19 (×3): qty 0.5

## 2016-10-19 MED ORDER — IOPAMIDOL (ISOVUE-300) INJECTION 61%
30.0000 mL | Freq: Once | INTRAVENOUS | Status: AC | PRN
  Administered 2016-10-19: 30 mL via ORAL
  Filled 2016-10-19: qty 30

## 2016-10-19 MED ORDER — FENTANYL CITRATE (PF) 100 MCG/2ML IJ SOLN
75.0000 ug | Freq: Once | INTRAMUSCULAR | Status: AC
  Administered 2016-10-19: 75 ug via INTRAVENOUS

## 2016-10-19 MED ORDER — ACETAMINOPHEN 325 MG PO TABS: 650.0000 mg | ORAL_TABLET | Freq: Four times a day (QID) | ORAL | Status: DC | PRN

## 2016-10-19 MED ORDER — PIPERACILLIN-TAZOBACTAM 3.375 G IVPB 30 MIN
INTRAVENOUS | Status: AC
  Filled 2016-10-19: qty 50

## 2016-10-19 MED ORDER — MORPHINE BOLUS VIA INFUSION: 2.0000 mg | INTRAVENOUS | Status: DC | PRN

## 2016-10-19 MED ORDER — PIPERACILLIN-TAZOBACTAM 3.375 G IVPB 30 MIN
3.3750 g | Freq: Once | INTRAVENOUS | Status: AC
  Administered 2016-10-19: 3.375 g via INTRAVENOUS

## 2016-10-19 MED ORDER — DULOXETINE HCL 20 MG PO CPEP
40.0000 mg | ORAL_CAPSULE | Freq: Every day | ORAL | Status: DC
  Administered 2016-10-20 – 2016-10-26 (×6): 40 mg via ORAL
  Filled 2016-10-19 (×8): qty 2

## 2016-10-19 MED ORDER — ONDANSETRON HCL 4 MG/2ML IJ SOLN
4.0000 mg | Freq: Once | INTRAMUSCULAR | Status: AC
  Administered 2016-10-19: 4 mg via INTRAVENOUS

## 2016-10-19 MED ORDER — ENOXAPARIN SODIUM 40 MG/0.4ML ~~LOC~~ SOLN
40.0000 mg | SUBCUTANEOUS | Status: DC
  Administered 2016-10-20 – 2016-10-22 (×3): 40 mg via SUBCUTANEOUS
  Filled 2016-10-19 (×4): qty 0.4

## 2016-10-19 MED ORDER — MORPHINE SULFATE (PF) 2 MG/ML IV SOLN
2.0000 mg | INTRAVENOUS | Status: DC | PRN
  Administered 2016-10-19: 2 mg via INTRAVENOUS
  Filled 2016-10-19: qty 1

## 2016-10-19 MED ORDER — SODIUM CHLORIDE 0.9 % IV SOLN
INTRAVENOUS | Status: DC
  Administered 2016-10-19 – 2016-10-23 (×8): via INTRAVENOUS
  Administered 2016-10-23: 1000 mL via INTRAVENOUS

## 2016-10-19 MED ORDER — FENTANYL CITRATE (PF) 100 MCG/2ML IJ SOLN
100.0000 ug | Freq: Once | INTRAMUSCULAR | Status: AC
  Administered 2016-10-19: 100 ug via INTRAVENOUS

## 2016-10-19 MED ORDER — LORAZEPAM 1 MG PO TABS
1.0000 mg | ORAL_TABLET | Freq: Every day | ORAL | Status: DC
  Administered 2016-10-19 – 2016-10-25 (×7): 1 mg via ORAL
  Filled 2016-10-19 (×7): qty 1

## 2016-10-19 MED ORDER — ONDANSETRON HCL 4 MG/2ML IJ SOLN
4.0000 mg | Freq: Four times a day (QID) | INTRAMUSCULAR | Status: DC | PRN
  Administered 2016-10-19 – 2016-10-26 (×11): 4 mg via INTRAVENOUS
  Filled 2016-10-19 (×11): qty 2

## 2016-10-19 MED ORDER — SODIUM CHLORIDE 0.9 % IV SOLN
1.0000 g | INTRAVENOUS | Status: DC
  Administered 2016-10-19: 1 g via INTRAVENOUS
  Filled 2016-10-19 (×2): qty 1

## 2016-10-19 MED ORDER — FENTANYL CITRATE (PF) 100 MCG/2ML IJ SOLN
INTRAMUSCULAR | Status: AC
  Filled 2016-10-19: qty 2

## 2016-10-19 MED ORDER — ACETAMINOPHEN 650 MG RE SUPP: 650.0000 mg | Freq: Four times a day (QID) | RECTAL | Status: DC | PRN

## 2016-10-19 NOTE — H&P (Signed)
Morgan Cisneros is an 51 y.o. female.   Chief Complaint: Abdominal pain HPI: This is a 51 year old female who started having abdominal pain about a month ago. She saw her primary care doctor and was diagnosed with diverticulitis and started on Cipro and Flagyl. She improved over the week but then became worse. Saw her primary care doctor last Thursday started back on same antibiotics and had a CT scan that showed diverticulitis with a possible small abscess. She was scheduled to see Dr. Jamal Collin for outpatient evaluation. However her abdominal pain became worse and she presents the ER. Repeat CT scan shows worsening diverticulitis but no abscess.  Past Medical History:  Diagnosis Date  . Abdominal pain   . Flank pain   . Gross hematuria   . History of kidney stones   . Insomnia   . Ovarian cyst   . Renal calculus, right   . Thoracic back pain    Right sided    Past Surgical History:  Procedure Laterality Date  . ABDOMINAL HYSTERECTOMY  08/2013  . ABDOMINAL HYSTERECTOMY    . BREAST BIOPSY Left 08/10/2016   [place] core  . CYSTOSCOPY  11/26/2010   with stent placement  . HEMORRHOID SURGERY    . kidney stent    . LITHOTRIPSY     X 4  . TONSILLECTOMY    . TUBAL LIGATION    . Ureteroscopy Right 11/21/1997   with holmium laser    Family History  Problem Relation Age of Onset  . Kidney Stones Mother   . Ovarian cancer Mother   . Colon cancer Mother   . Lung cancer Father   . Hyperlipidemia Sister   . Hypertension Sister   . Alzheimer's disease Maternal Grandmother   . Cancer Maternal Grandfather        Liver  . Alzheimer's disease Paternal Grandfather   . Hypertension Sister   . Hyperlipidemia Sister   . Breast cancer Neg Hx    Social History:  reports that she has been smoking.  She has been smoking about 0.50 packs per day. She has never used smokeless tobacco. She reports that she drinks alcohol. She reports that she does not use drugs.  Allergies:  Allergies  Allergen  Reactions  . Urocit-K [Potassium Citrate] Nausea Only     (Not in a hospital admission)  Results for orders placed or performed during the hospital encounter of 10/19/16 (from the past 48 hour(s))  Lipase, blood     Status: None   Collection Time: 10/19/16  1:41 PM  Result Value Ref Range   Lipase 26 11 - 51 U/L  Comprehensive metabolic panel     Status: Abnormal   Collection Time: 10/19/16  1:41 PM  Result Value Ref Range   Sodium 141 135 - 145 mmol/L   Potassium 4.0 3.5 - 5.1 mmol/L   Chloride 106 101 - 111 mmol/L   CO2 25 22 - 32 mmol/L   Glucose, Bld 90 65 - 99 mg/dL   BUN 9 6 - 20 mg/dL   Creatinine, Ser 0.66 0.44 - 1.00 mg/dL   Calcium 9.7 8.9 - 10.3 mg/dL   Total Protein 7.7 6.5 - 8.1 g/dL   Albumin 4.3 3.5 - 5.0 g/dL   AST 29 15 - 41 U/L   ALT 42 14 - 54 U/L   Alkaline Phosphatase 133 (H) 38 - 126 U/L   Total Bilirubin 0.6 0.3 - 1.2 mg/dL   GFR calc non Af Amer >60 >60  mL/min   GFR calc Af Amer >60 >60 mL/min    Comment: (NOTE) The eGFR has been calculated using the CKD EPI equation. This calculation has not been validated in all clinical situations. eGFR's persistently <60 mL/min signify possible Chronic Kidney Disease.    Anion gap 10 5 - 15  CBC     Status: Abnormal   Collection Time: 10/19/16  1:41 PM  Result Value Ref Range   WBC 12.6 (H) 3.6 - 11.0 K/uL   RBC 4.98 3.80 - 5.20 MIL/uL   Hemoglobin 15.8 12.0 - 16.0 g/dL   HCT 46.4 35.0 - 47.0 %   MCV 93.1 80.0 - 100.0 fL   MCH 31.8 26.0 - 34.0 pg   MCHC 34.1 32.0 - 36.0 g/dL   RDW 13.8 11.5 - 14.5 %   Platelets 396 150 - 440 K/uL  Urinalysis, Complete w Microscopic     Status: Abnormal   Collection Time: 10/19/16  1:41 PM  Result Value Ref Range   Color, Urine YELLOW (A) YELLOW   APPearance CLEAR (A) CLEAR   Specific Gravity, Urine 1.009 1.005 - 1.030   pH 6.0 5.0 - 8.0   Glucose, UA NEGATIVE NEGATIVE mg/dL   Hgb urine dipstick MODERATE (A) NEGATIVE   Bilirubin Urine NEGATIVE NEGATIVE   Ketones,  ur 20 (A) NEGATIVE mg/dL   Protein, ur NEGATIVE NEGATIVE mg/dL   Nitrite NEGATIVE NEGATIVE   Leukocytes, UA TRACE (A) NEGATIVE   RBC / HPF 0-5 0 - 5 RBC/hpf   WBC, UA 0-5 0 - 5 WBC/hpf   Bacteria, UA RARE (A) NONE SEEN   Squamous Epithelial / LPF 0-5 (A) NONE SEEN   Mucous PRESENT   hCG, quantitative, pregnancy     Status: None   Collection Time: 10/19/16  1:41 PM  Result Value Ref Range   hCG, Beta Chain, Quant, S 3 <5 mIU/mL    Comment:          GEST. AGE      CONC.  (mIU/mL)   <=1 WEEK        5 - 50     2 WEEKS       50 - 500     3 WEEKS       100 - 10,000     4 WEEKS     1,000 - 30,000     5 WEEKS     3,500 - 115,000   6-8 WEEKS     12,000 - 270,000    12 WEEKS     15,000 - 220,000        FEMALE AND NON-PREGNANT FEMALE:     LESS THAN 5 mIU/mL   Lactic acid, plasma     Status: None   Collection Time: 10/19/16  2:48 PM  Result Value Ref Range   Lactic Acid, Venous 1.1 0.5 - 1.9 mmol/L   Ct Abdomen Pelvis W Contrast  Result Date: 10/19/2016 CLINICAL DATA:  Initial evaluation for acute diverticulitis, feeling outpatient management, possible recent abscess. EXAM: CT ABDOMEN AND PELVIS WITH CONTRAST TECHNIQUE: Multidetector CT imaging of the abdomen and pelvis was performed using the standard protocol following bolus administration of intravenous contrast. CONTRAST:  146m ISOVUE-300 IOPAMIDOL (ISOVUE-300) INJECTION 61% COMPARISON:  Prior CT from 10/14/2016. FINDINGS: Lower chest: Hazy subsegmental atelectatic changes present dependently within the visualized lung bases. Visualized lungs are otherwise clear. Hepatobiliary: Scattered hypodensities noted throughout the liver, likely small cysts and/or possibly biliary hamartomas, grossly stable. Liver otherwise unremarkable. Gallbladder within  normal limits. No biliary dilatation. Pancreas: Pancreas within normal limits. Spleen: Spleen within normal limits. Adrenals/Urinary Tract: The adrenal glands are normal. Kidneys equal in size with  symmetric enhancement. Punctate 3 mm bilateral nonobstructive nephrolithiasis, greater on the right. No hydronephrosis. subcentimeter hypodensity at the lower pole the left kidney too small the characterize, but statistically likely reflects a small cyst. No focal enhancing renal mass. No hydroureter. Bladder within normal limits. Stomach/Bowel: Small hiatal hernia noted. Stomach otherwise unremarkable. No evidence for bowel obstruction. Appendix normal. Multiple diverticula again seen involving the descending and sigmoid colon. There is persistent inflammatory stranding about several diverticula at the sigmoid colon, consistent with acute diverticulitis. Previously question small diverticular abscess is not seen on today's study. This most likely reflected a small inflamed diverticulum upon retrospect. No evidence for perforation, abscess, or other complication identified on today's study. Overall, degree of inflammatory changes has worsened relative to recent exam. Anastomotic suture noted at the rectum. Moderate retained stool elsewhere within the colon, which may reflect underlying constipation. Vascular/Lymphatic: Mild atheromatous plaque within the infrarenal aorta. No aneurysm. Normal intravascular enhancement seen throughout the intra-abdominal aorta and its branch vessels. No adenopathy. Reproductive: Uterus is absent.  Ovaries not discretely identified. Other: Moderate volume free fluid within the pelvis, likely reactive. No free intraperitoneal air. Musculoskeletal: No acute osseous abnormality. No worrisome lytic or blastic osseous lesions. IMPRESSION: 1. Findings consistent with acute diverticulitis, progressed/worsened relative to recent CT from 10/14/2016. No discrete abscess identified on today's examination. No evidence for perforation or other complication. 2. Moderate diffuse stool throughout the remainder of the colon, suggesting underlying constipation. 3. Bilateral nonobstructive  nephrolithiasis. Electronically Signed   By: Jeannine Boga M.D.   On: 10/19/2016 17:04    Review of Systems  Constitutional: Negative for chills and fever.  HENT: Negative for hearing loss.   Eyes: Negative for blurred vision.  Respiratory: Negative for shortness of breath.   Cardiovascular: Negative for chest pain.  Gastrointestinal: Positive for abdominal pain. Negative for nausea and vomiting.  Genitourinary: Negative for dysuria.  Musculoskeletal: Negative for joint pain.  Skin: Negative for rash.  Neurological: Negative for dizziness.    Pulse (!) 107, temperature 98.6 F (37 C), temperature source Oral, resp. rate 18, height _0  (1.651 m), weight 63.5 kg (140 lb), SpO2 98 %. Physical Exam  Constitutional: She is oriented to person, place, and time. She appears well-developed and well-nourished. No distress.  HENT:  Head: Normocephalic and atraumatic.  Mouth/Throat: Oropharynx is clear and moist. No oropharyngeal exudate.  Eyes: EOM are normal. Left eye exhibits no discharge.  Neck: Neck supple. No JVD present. No tracheal deviation present. No thyromegaly present.  Cardiovascular: Normal rate and regular rhythm.   No murmur heard. Respiratory: Effort normal and breath sounds normal. No respiratory distress. She has no wheezes. She exhibits no tenderness.  GI: Soft.  Tender in the suprapubic area. No rebound or guarding. Bowel sounds are positive.  Musculoskeletal: She exhibits no edema.  Lymphadenopathy:    She has no cervical adenopathy.  Neurological: She is alert and oriented to person, place, and time. No cranial nerve deficit.  Skin: Skin is warm and dry.     Assessment/Plan 1. Diverticulitis. She has failed outpatient therapy. Repeat CT scan does not show evidence of abscess so doubt she'll need surgical intervention at this point. Can change her antibiotics to IV Invanz. Make her nothing by mouth except for sips and medicines. See how she progresses over  the next 24-48 hours  and then can start her on light diet. 2. Insomnia. She takes Ativan daily at bedtime. Next line Total time spent 30 minutes  Baxter Hire, MD 10/19/2016, 5:41 PM

## 2016-10-19 NOTE — ED Triage Notes (Signed)
Pt to ed with c/o abd pain and nausea and vomiting.  Pt states recently diagnosed with diverticulitis and started on cipro and flagyl.  Pt states has been using cipro and flagyl for the last 4 weeks. However, pain continues and n/v continues with weight loss.

## 2016-10-19 NOTE — ED Provider Notes (Signed)
[facility] Emergency Department Provider Note  ____________________________________________   First MD Initiated Contact with Patient 10/19/16 1422     (approximate)  I have reviewed the triage vital signs and the nursing notes.   HISTORY  Chief Complaint Abdominal Pain    HPI Morgan Cisneros is a 51 y.o. female who self presents to the emergency department with moderate to severe crampy left-sided abdominal pain nausea and diarrhea as well as fever. For the past month she is been battling diverticulitis. She was initially diagnosed about a month ago she completed a course of ciprofloxacin and Flagyl which she felt initially helped however her symptoms recurred. 5 days ago she had an outpatient CT scan showing recurrent focal diverticulitis with a small abscess. She has been taking ciprofloxacin and Flagyl once again although she feels like this is not helping. She felt a fever yesterday along with worsening abdominal pain and difficulty keeping food down. She has no history of abdominal surgeries. Nothing seems to make the pain better or worse.   Past Medical History:  Diagnosis Date  . Abdominal pain   . Flank pain   . Gross hematuria   . History of kidney stones   . Insomnia   . Ovarian cyst   . Renal calculus, right   . Thoracic back pain    Right sided    Patient Active Problem List   Diagnosis Date Noted  . Diverticulitis 10/19/2016  . GERD (gastroesophageal reflux disease) 04/18/2015  . Menopausal symptoms 04/18/2015  . Renal calculus, right   . Insomnia   . Family history of colon cancer in mother 01/15/2015  . Family history of ovarian cancer 01/15/2015  . Tobacco abuse 01/15/2015  . Lymphadenopathy, axillary 01/15/2015  . Calculus of kidney 12/13/2014  . LBP (low back pain) 12/13/2014  . Female genuine stress incontinence 12/13/2014    Past Surgical History:  Procedure Laterality Date  . ABDOMINAL HYSTERECTOMY  08/2013  .  ABDOMINAL HYSTERECTOMY    . BREAST BIOPSY Left 08/10/2016   [place] core  . CYSTOSCOPY  11/26/2010   with stent placement  . HEMORRHOID SURGERY    . kidney stent    . LITHOTRIPSY     X 4  . TONSILLECTOMY    . TUBAL LIGATION    . Ureteroscopy Right 11/21/1997   with holmium laser    Prior to Admission medications   Medication Sig Start Date End Date Taking? Authorizing Provider  ciprofloxacin (CIPRO) 500 MG tablet Take 1 tablet (500 mg total) by mouth 2 (two) times daily. 10/14/16  Yes Johnson, Megan P, DO  DULoxetine HCl 40 MG CPEP Take 40 mg by mouth daily. 07/08/16  Yes Johnson, Megan P, DO  LORazepam (ATIVAN) 1 MG tablet TAKE 1/2 TO 1 TABLET BY MOUTH TWICE DAILY AS NEEDED FOR ANXIETY 10/01/16  Yes Johnson, Megan P, DO  metroNIDAZOLE (FLAGYL) 500 MG tablet Take 1 tablet (500 mg total) by mouth 2 (two) times daily. 10/14/16  Yes Johnson, Megan P, DO  ranitidine (ZANTAC) 150 MG tablet Take 150 mg by mouth 2 (two) times daily as needed for heartburn.   Yes [provider]    Allergies Urocit-k [potassium citrate]  Family History  Problem Relation Age of Onset  . Kidney Stones Mother   . Ovarian cancer Mother   . Colon cancer Mother   . Lung cancer Father   . Hyperlipidemia Sister   . Hypertension Sister   . Alzheimer's disease Maternal Grandmother   .  Cancer Maternal Grandfather        Liver  . Alzheimer's disease Paternal Grandfather   . Hypertension Sister   . Hyperlipidemia Sister   . Breast cancer Neg Hx     Social History Social History  Substance Use Topics  . Smoking status: Current Every Day Smoker    Packs/day: 0.50  . Smokeless tobacco: Never Used  . Alcohol use 0.0 oz/week     Comment: occasional    Review of Systems Constitutional: Positive fever Eyes: No visual changes. ENT: No sore throat. Cardiovascular: Denies chest pain. Respiratory: Denies shortness of breath. Gastrointestinal: Positive abdominal pain.  Positive nausea, no vomiting.     Genitourinary: Negative for dysuria. Musculoskeletal: Negative for back pain. Skin: Negative for rash. Neurological: Negative for headaches, focal weakness or numbness.   ____________________________________________   PHYSICAL EXAM:  VITAL SIGNS: ED Triage Vitals  Enc Vitals Group     BP --      Pulse Rate 10/19/16 1340 (!) 107     Resp 10/19/16 1340 18     Temp 10/19/16 1340 98.6 F (37 C)     Temp Source 10/19/16 1340 Oral     SpO2 10/19/16 1340 98 %     Weight 10/19/16 1340 140 lb (63.5 kg)     Height 10/19/16 1340 5\' 5"  (1.651 m)     Head Circumference --      Peak Flow --      Pain Score 10/19/16 1339 9     Pain Loc --      Pain Edu? --      Excl. in [place]? --     Constitutional: Alert and oriented 4 appears uncomfortable nontoxic no diaphoresis speaks full clear sentences Eyes: PERRL EOMI. Head: Atraumatic. Nose: No congestion/rhinnorhea. Mouth/Throat: No trismus Neck: No stridor.   Cardiovascular: Tachycardic rate, regular rhythm. Grossly normal heart sounds.  Good peripheral circulation. Respiratory: Normal respiratory effort.  No retractions. Lungs CTAB and moving good air Gastrointestinal: Quite tender on left side of her abdomen with rebound and guarding Musculoskeletal: No lower extremity edema   Neurologic:  Normal speech and language. No gross focal neurologic deficits are appreciated. Skin:  Skin is warm, dry and intact. No rash noted. Psychiatric: Mood and affect are normal. Speech and behavior are normal.    ____________________________________________   DIFFERENTIAL includes but not limited to  Diverticulitis, fistula, perforation, abscess ____________________________________________   LABS (all labs ordered are listed, but only abnormal results are displayed)  Labs Reviewed  COMPREHENSIVE METABOLIC PANEL - Abnormal; Notable for the following:       Result Value   Alkaline Phosphatase 133 (*)    All other components within normal limits   CBC - Abnormal; Notable for the following:    WBC 12.6 (*)    All other components within normal limits  URINALYSIS, COMPLETE (UACMP) WITH MICROSCOPIC - Abnormal; Notable for the following:    Color, Urine YELLOW (*)    APPearance CLEAR (*)    Hgb urine dipstick MODERATE (*)    Ketones, ur 20 (*)    Leukocytes, UA TRACE (*)    Bacteria, UA RARE (*)    Squamous Epithelial / LPF 0-5 (*)    All other components within normal limits  CULTURE, BLOOD (ROUTINE X 2)  CULTURE, BLOOD (ROUTINE X 2)  LIPASE, BLOOD  HCG, QUANTITATIVE, PREGNANCY  LACTIC ACID, PLASMA  CBC  HIV ANTIBODY (ROUTINE TESTING)    Elevated white count is nonspecific but could be suggestive  of infection __________________________________________  EKG   ____________________________________________  RADIOLOGYCT scan shows no abscess or other surgical etiology however does show progression of diverticulitis despite antibiotic therapy ____________________________________________   PROCEDURES  Procedure(s) performed: no  Procedures  Critical Care performed: no  Observation: no ____________________________________________   INITIAL IMPRESSION / ASSESSMENT AND PLAN / ED COURSE  Pertinent labs & imaging results that were available during my care of the patient were reviewed by me and considered in my medical decision making (see chart for details).  The patient arrives uncomfortable appearing with a significantly tender abdomen. Her outpatient CT scan several days ago did show a small abscess and she seems to be worsening. At this point I'm concerned that she may have failed outpatient management and that her abscesses grown leading to a surgical cause of her pain. CT scan is pending.  Fortunately the patient has no abscess or perforation however her diverticulitis has worsened. She has failed outpatient management. I will treat her with intravenous Zosyn as well as admit her to the hospital for further  treatment.      ____________________________________________   FINAL CLINICAL IMPRESSION(S) / ED DIAGNOSES  Final diagnoses:  Diverticulitis of large intestine without perforation or abscess without bleeding      NEW MEDICATIONS STARTED DURING THIS VISIT:  Current Discharge Medication List       Note:  This document was prepared using Dragon voice recognition software and may include unintentional dictation errors.     Darel Hong, MD 10/20/16 1428

## 2016-10-20 ENCOUNTER — Ambulatory Visit: Payer: Self-pay | Admitting: General Surgery

## 2016-10-20 LAB — CBC
HCT: 38.4 % (ref 35.0–47.0)
Hemoglobin: 13.5 g/dL (ref 12.0–16.0)
MCH: 33.3 pg (ref 26.0–34.0)
MCHC: 35.2 g/dL (ref 32.0–36.0)
MCV: 94.7 fL (ref 80.0–100.0)
PLATELETS: 319 10*3/uL (ref 150–440)
RBC: 4.05 MIL/uL (ref 3.80–5.20)
RDW: 13.6 % (ref 11.5–14.5)
WBC: 10.3 10*3/uL (ref 3.6–11.0)

## 2016-10-20 MED ORDER — SODIUM CHLORIDE 0.9 % IV SOLN
3.0000 g | Freq: Four times a day (QID) | INTRAVENOUS | Status: DC
  Administered 2016-10-20 – 2016-10-22 (×8): 3 g via INTRAVENOUS
  Filled 2016-10-20 (×11): qty 3

## 2016-10-20 MED ORDER — BOOST / RESOURCE BREEZE PO LIQD
1.0000 | Freq: Three times a day (TID) | ORAL | Status: DC
  Administered 2016-10-20 – 2016-10-24 (×3): 1 via ORAL

## 2016-10-20 MED ORDER — HYDROMORPHONE HCL 1 MG/ML IJ SOLN
0.2500 mg | INTRAMUSCULAR | Status: DC | PRN
  Administered 2016-10-20 – 2016-10-22 (×13): 0.25 mg via INTRAVENOUS
  Filled 2016-10-20 (×14): qty 0.5

## 2016-10-20 MED ORDER — BISACODYL 10 MG RE SUPP
10.0000 mg | Freq: Once | RECTAL | Status: AC
  Administered 2016-10-20: 10 mg via RECTAL
  Filled 2016-10-20: qty 1

## 2016-10-20 MED ORDER — DOCUSATE SODIUM 100 MG PO CAPS
100.0000 mg | ORAL_CAPSULE | Freq: Two times a day (BID) | ORAL | Status: DC
  Administered 2016-10-20 – 2016-10-23 (×6): 100 mg via ORAL
  Filled 2016-10-20 (×6): qty 1

## 2016-10-20 NOTE — Progress Notes (Signed)
Chaplain responded to a consult for advanced directive for this pt. CH met pt and daughter and husband who were at the bedside. Pt states she wanted to complete a living will but changed her mind and will now  do it after living hospital. [facility] thanked the pt and left the room.    10/20/16 1000  Clinical Encounter Type  Visited With Patient and family together  Visit Type Initial;Other (Comment)  Referral From Nurse  Consult/Referral To Chaplain  Spiritual Encounters  Spiritual Needs Other (Comment)

## 2016-10-20 NOTE — Progress Notes (Signed)
Initial Nutrition Assessment  DOCUMENTATION CODES:   Severe malnutrition in context of acute illness/injury  INTERVENTION:  Provide Boost Breeze po TID, each supplement provides 250 kcal and 9 grams of protein.   Encouraged adequate intake of calories and protein when able to tolerate as diet advanced by MD. Discussed importance of meeting calorie/protein needs to prevent further loss of weight.  Reviewed diet recommendations for diverticulitis. Encouraged intake of foods low in fiber. Discussed that once diverticulitis has improved patient can gradually begin increasing fiber in diet.   NUTRITION DIAGNOSIS:   Malnutrition (Severe) related to acute illness (diverticulitis) as evidenced by energy intake < or equal to 50% for > or equal to 5 days, 6.7 percent weight loss over 1 month.  GOAL:   Patient will meet greater than or equal to 90% of their needs  MONITOR:   PO intake, Supplement acceptance, Labs, Weight trends, Diet advancement, I & O's  REASON FOR ASSESSMENT:   Malnutrition Screening Tool    ASSESSMENT:   51 year old female with PMHx of recently diagnosed diverticulitis who presents with ongoing abdominal pain for the past month. Patient admitted with acute diverticulitis that has failed outpatient therapy. Also has severe constipation.   -Per CT abd/pelvis with contrast yesterday no evidence of abscess of perforation. Moderate diffuse stool throughout colon suggesting constipation.  Spoke with patient and her daughter at bedside. Patient reports she has had a poor appetite for the past 4 weeks with her diverticulitis. She endorses nausea and abdominal pain. Reports she has only been able to tolerate water, potatoes, and rice PO. Only eating small amounts of potatoes and rice. When she has tried to eat other foods/beverages she did not tolerate. Reports having a peanut butter sandwich (smooth PB) one day PTA, but had abdominal pain and nausea afterwards. Patient reports  she is familiar with nutrition recommendations during diverticulitis (low fiber). Patient is amenable to drinking a clear oral nutrition supplement to help meet calorie/protein needs. She is not sure she would tolerate Ensure at this time, even when advanced to full liquids.  UBW 150 lbs. Patient reports she has lost 10 lbs (6.7% body weight) over the past month, which is significant for time frame.  Medications reviewed and include: Colace, NS @ 10 ml/hr, Unasyn.  Labs reviewed: Alkaline Phosphatase 133 on 7/17.   Nutrition-Focused physical exam completed. Findings are mild-moderate fat depletion in upper arm region, mild-moderate muscle depletion in posterior calf region, and no edema.   Diet Order:  Diet clear liquid Room service appropriate? Yes; Fluid consistency: Thin  Skin:  Reviewed, no issues  Last BM:  10/20/2016 - type 5  Height:   Ht Readings from Last 1 Encounters:  10/19/16 5\' 5"  (1.651 m)    Weight:   Wt Readings from Last 1 Encounters:  10/19/16 140 lb (63.5 kg)    Ideal Body Weight:  56.8 kg  BMI:  Body mass index is 23.3 kg/m.  Estimated Nutritional Needs:   Kcal:  1515-1770 (MSJ x 1.2-1.4)  Protein:  65-75 grams (1-1.2 grams/kg)  Fluid:  1.9 L/day (30 ml/kg)  EDUCATION NEEDS:   Education needs addressed  Willey Blade, MS, RD, LDN Pager: [phone] After Hours Pager: [phone]

## 2016-10-20 NOTE — Progress Notes (Signed)
[place] [facility]                                                                                                                                                                                  Patient Demographics   Morgan Cisneros, is a 51 y.o. female, DOB - 05-29-1965, ZOX:[id number]  Admit date - 10/19/2016   Admitting Physician Baxter Hire, MD  Outpatient Primary MD for the patient is Park Liter P, DO   LOS - 1  Subjective: Some improvement in abdominal pain, States no bm in 5 days    Review of Systems:   CONSTITUTIONAL: No documented fever. No fatigue, weakness. No weight gain, no weight loss.  EYES: No blurry or double vision.  ENT: No tinnitus. No postnasal drip. No redness of the oropharynx.  RESPIRATORY: No cough, no wheeze, no hemoptysis. No dyspnea.  CARDIOVASCULAR: No chest pain. No orthopnea. No palpitations. No syncope.  GASTROINTESTINAL: No nausea, no vomiting or diarrhea. + abdominal pain. No melena or hematochezia.  GENITOURINARY: No dysuria or hematuria.  ENDOCRINE: No polyuria or nocturia. No heat or cold intolerance.  HEMATOLOGY: No anemia. No bruising. No bleeding.  INTEGUMENTARY: No rashes. No lesions.  MUSCULOSKELETAL: No arthritis. No swelling. No gout.  NEUROLOGIC: No numbness, tingling, or ataxia. No seizure-type activity.  PSYCHIATRIC: No anxiety. No insomnia. No ADD.    Vitals:   Vitals:   10/19/16 1857 10/19/16 2013 10/20/16 0510 10/20/16 1219  BP: 124/82 118/85 108/63 126/81  Pulse: 77 78 88 81  Resp: 18 18 18    Temp: 98.9 F (37.2 C) 98.9 F (37.2 C)  98.1 F (36.7 C)  TempSrc: Oral Oral  Oral  SpO2: 98% 100% 95% 97%  Weight:      Height:        Wt Readings from Last 3 Encounters:  10/19/16 140 lb (63.5 kg)  10/14/16 144 lb 4 oz (65.4 kg)  09/24/16 147 lb (66.7 kg)     Intake/Output Summary (Last 24 hours) at 10/20/16 1400 Last data filed at 10/20/16 1153  Gross per 24 hour  Intake           3607.33 ml  Output                0 ml  Net          3607.33 ml    Physical Exam:   GENERAL: Pleasant-appearing in no apparent distress.  HEAD, EYES, EARS, NOSE AND THROAT: Atraumatic, normocephalic. Extraocular muscles are intact. Pupils equal and reactive to light. Sclerae anicteric. No conjunctival injection. No oro-pharyngeal erythema.  NECK: Supple. There is no jugular venous distention. No bruits, no  lymphadenopathy, no thyromegaly.  HEART: Regular rate and rhythm,. No murmurs, no rubs, no clicks.  LUNGS: Clear to auscultation bilaterally. No rales or rhonchi. No wheezes.  ABDOMEN: Soft, flat, Positive llq tenderness. Has good bowel sounds. No hepatosplenomegaly appreciated.  EXTREMITIES: No evidence of any cyanosis, clubbing, or peripheral edema.  +2 pedal and radial pulses bilaterally.  NEUROLOGIC: The patient is alert, awake, and oriented x3 with no focal motor or sensory deficits appreciated bilaterally.  SKIN: Moist and warm with no rashes appreciated.  Psych: Not anxious, depressed LN: No inguinal LN enlargement    Antibiotics   Anti-infectives    Start     Dose/Rate Route Frequency Ordered Stop   10/20/16 1400  Ampicillin-Sulbactam (UNASYN) 3 g in sodium chloride 0.9 % 100 mL IVPB     3 g 200 mL/hr over 30 Minutes Intravenous Every 6 hours 10/20/16 1323     10/19/16 2000  ertapenem (INVANZ) 1 g in sodium chloride 0.9 % 50 mL IVPB  Status:  Discontinued     1 g 100 mL/hr over 30 Minutes Intravenous Every 24 hours 10/19/16 1907 10/20/16 1323   10/19/16 1458  piperacillin-tazobactam (ZOSYN) 3-0.375 GM/50ML IVPB    Comments:  Gwynn Burly   : cabinet override      10/19/16 1458 10/20/16 0314   10/19/16 1445  piperacillin-tazobactam (ZOSYN) IVPB 3.375 g     3.375 g 100 mL/hr over 30 Minutes Intravenous  Once 10/19/16 1432 10/19/16 1717      Medications   Scheduled Meds: . bisacodyl  10 mg Rectal Once  . docusate sodium  100 mg Oral BID  . DULoxetine  40 mg Oral  Daily  . enoxaparin (LOVENOX) injection  40 mg Subcutaneous Q24H  . LORazepam  1 mg Oral QHS   Continuous Infusions: . sodium chloride 100 mL/hr at 10/20/16 0315  . ampicillin-sulbactam (UNASYN) IV     PRN Meds:.acetaminophen **OR** acetaminophen, HYDROmorphone (DILAUDID) injection, ondansetron (ZOFRAN) IV   Data Review:   Micro Results Recent Results (from the past 240 hour(s))  Microscopic Examination     Status: Abnormal   Collection Time: 10/14/16  8:49 AM  Result Value Ref Range Status   RBC, UA 3-10 (A) 0 - 2 /hpf Final   Epithelial Cells (non renal) 0-10 0 - 10 /hpf Final  Blood Culture (routine x 2)     Status: None (Preliminary result)   Collection Time: 10/19/16  2:48 PM  Result Value Ref Range Status   Specimen Description BLOOD BLOOD LEFT HAND  Final   Special Requests   Final    BOTTLES DRAWN AEROBIC AND ANAEROBIC Blood Culture adequate volume   Culture NO GROWTH < 24 HOURS  Final   Report Status PENDING  Incomplete  Blood Culture (routine x 2)     Status: None (Preliminary result)   Collection Time: 10/19/16  2:48 PM  Result Value Ref Range Status   Specimen Description BLOOD RIGHT ANTECUBITAL  Final   Special Requests   Final    BOTTLES DRAWN AEROBIC AND ANAEROBIC Blood Culture adequate volume   Culture NO GROWTH < 24 HOURS  Final   Report Status PENDING  Incomplete    Radiology Reports Ct Abdomen Pelvis W Contrast  Result Date: 10/19/2016 CLINICAL DATA:  Initial evaluation for acute diverticulitis, feeling outpatient management, possible recent abscess. EXAM: CT ABDOMEN AND PELVIS WITH CONTRAST TECHNIQUE: Multidetector CT imaging of the abdomen and pelvis was performed using the standard protocol following bolus administration of intravenous contrast.  CONTRAST:  152mL ISOVUE-300 IOPAMIDOL (ISOVUE-300) INJECTION 61% COMPARISON:  Prior CT from 10/14/2016. FINDINGS: Lower chest: Hazy subsegmental atelectatic changes present dependently within the visualized lung  bases. Visualized lungs are otherwise clear. Hepatobiliary: Scattered hypodensities noted throughout the liver, likely small cysts and/or possibly biliary hamartomas, grossly stable. Liver otherwise unremarkable. Gallbladder within normal limits. No biliary dilatation. Pancreas: Pancreas within normal limits. Spleen: Spleen within normal limits. Adrenals/Urinary Tract: The adrenal glands are normal. Kidneys equal in size with symmetric enhancement. Punctate 3 mm bilateral nonobstructive nephrolithiasis, greater on the right. No hydronephrosis. subcentimeter hypodensity at the lower pole the left kidney too small the characterize, but statistically likely reflects a small cyst. No focal enhancing renal mass. No hydroureter. Bladder within normal limits. Stomach/Bowel: Small hiatal hernia noted. Stomach otherwise unremarkable. No evidence for bowel obstruction. Appendix normal. Multiple diverticula again seen involving the descending and sigmoid colon. There is persistent inflammatory stranding about several diverticula at the sigmoid colon, consistent with acute diverticulitis. Previously question small diverticular abscess is not seen on today's study. This most likely reflected a small inflamed diverticulum upon retrospect. No evidence for perforation, abscess, or other complication identified on today's study. Overall, degree of inflammatory changes has worsened relative to recent exam. Anastomotic suture noted at the rectum. Moderate retained stool elsewhere within the colon, which may reflect underlying constipation. Vascular/Lymphatic: Mild atheromatous plaque within the infrarenal aorta. No aneurysm. Normal intravascular enhancement seen throughout the intra-abdominal aorta and its branch vessels. No adenopathy. Reproductive: Uterus is absent.  Ovaries not discretely identified. Other: Moderate volume free fluid within the pelvis, likely reactive. No free intraperitoneal air. Musculoskeletal: No acute osseous  abnormality. No worrisome lytic or blastic osseous lesions. IMPRESSION: 1. Findings consistent with acute diverticulitis, progressed/worsened relative to recent CT from 10/14/2016. No discrete abscess identified on today's examination. No evidence for perforation or other complication. 2. Moderate diffuse stool throughout the remainder of the colon, suggesting underlying constipation. 3. Bilateral nonobstructive nephrolithiasis. Electronically Signed   By: Jeannine Boga M.D.   On: 10/19/2016 17:04   Ct Abdomen Pelvis W Contrast  Result Date: 10/14/2016 CLINICAL DATA:  BILATERAL lower abdominal pain and BILATERAL flank pain for 3 weeks, nausea, history diverticulitis, kidney stones, ovarian cyst, smoker, GERD EXAM: CT ABDOMEN AND PELVIS WITH CONTRAST TECHNIQUE: Multidetector CT imaging of the abdomen and pelvis was performed using the standard protocol following bolus administration of intravenous contrast. Sagittal and coronal MPR images reconstructed from axial data set. CONTRAST:  133mL ISOVUE-300 IOPAMIDOL (ISOVUE-300) INJECTION 61% IV. Dilute oral contrast. COMPARISON:  03/11/2014 FINDINGS: Lower chest: Minimal dependent atelectasis in posterior lungs Hepatobiliary: Tiny hepatic cysts largest 12 mm greatest size. Minimal focal fatty infiltration of liver adjacent to falciform fissure. Gallbladder and liver otherwise unremarkable. Pancreas: Normal appearance Spleen: Normal appearance Adrenals/Urinary Tract: Adrenal glands normal appearance. Tiny BILATERAL nonobstructing renal calculi. Kidneys, ureters, and bladder otherwise unremarkable. Stomach/Bowel: Normal appendix, retrocecal. Mild wall thickening of the ascending colon extending to hepatic flexure question artifact from underdistention versus colitis. In addition, numerous diverticula of the descending and sigmoid colon with wall thickening and pericolic inflammatory changes at the descending sigmoid junction consistent with acute diverticulitis.  Tiny fluid collection at site of inflammation questionably a tiny diverticular abscess, 10 x 7 x 6 mm in size. Stomach and small bowel loops unremarkable. Vascular/Lymphatic: Minimal atherosclerotic calcification aorta and iliac arteries. Aorta normal caliber. No adenopathy. Reproductive: Uterus surgically absent. Nonvisualization of ovaries. Other: No free air or free fluid.  No hernia. Musculoskeletal: No acute osseous  findings. IMPRESSION: Diverticulosis of the descending and sigmoid colon with focal acute diverticulitis at the descending sigmoid junction. Questionable tiny diverticular abscess 10 x 7 x 6 mm. Wall thickening of the ascending colon extending to the hepatic flexure, favor colitis though cannot completely exclude artifact from incomplete distention. Tiny BILATERAL nonobstructing renal calculi. Aortic Atherosclerosis (ICD10-I70.0). Electronically Signed   By: Lavonia Dana M.D.   On: 10/14/2016 11:35     CBC  Recent Labs Lab 10/14/16 0849 10/19/16 1341 10/20/16 0437  WBC 8.8 12.6* 10.3  HGB 14.1 15.8 13.5  HCT 42.8 46.4 38.4  PLT 316 396 319  MCV 98* 93.1 94.7  MCH 32.2 31.8 33.3  MCHC 32.9 34.1 35.2  RDW 14.2 13.8 13.6  LYMPHSABS 1.7  --   --     Chemistries   Recent Labs Lab 10/19/16 1341  NA 141  K 4.0  CL 106  CO2 25  GLUCOSE 90  BUN 9  CREATININE 0.66  CALCIUM 9.7  AST 29  ALT 42  ALKPHOS 133*  BILITOT 0.6   ------------------------------------------------------------------------------------------------------------------ estimated creatinine clearance is 76.5 mL/min (by C-G formula based on SCr of 0.66 mg/dL). ------------------------------------------------------------------------------------------------------------------ No results for input(s): HGBA1C in the last 72 hours. ------------------------------------------------------------------------------------------------------------------ No results for input(s): CHOL, HDL, LDLCALC, TRIG, CHOLHDL,  LDLDIRECT in the last 72 hours. ------------------------------------------------------------------------------------------------------------------ No results for input(s): TSH, T4TOTAL, T3FREE, THYROIDAB in the last 72 hours.  Invalid input(s): FREET3 ------------------------------------------------------------------------------------------------------------------ No results for input(s): VITAMINB12, FOLATE, FERRITIN, TIBC, IRON, RETICCTPCT in the last 72 hours.  Coagulation profile No results for input(s): INR, PROTIME in the last 168 hours.  No results for input(s): DDIMER in the last 72 hours.  Cardiac Enzymes No results for input(s): CKMB, TROPONINI, MYOGLOBIN in the last 168 hours.  Invalid input(s): CK ------------------------------------------------------------------------------------------------------------------ Invalid input(s): [place]   1. acute Diverticulitis. She has failed outpatient therapy.We will change antibiotics to Unasyn started on a clear liquid diet 2. Severe constipation start Colace and do a rectal suppository 3. Nicotine abuse states only smokes a few cigarettes I strongly recommended she stop smoking she states that she is not interested in nicotine patch 4 min spent-        Code Status Orders        Start     Ordered   10/19/16 1908  Full code  Continuous     10/19/16 1907    Code Status History    Date Active Date Inactive Code Status Order ID Comments User Context   This patient has a current code status but no historical code status.           Consultsnone  DVT Prophylaxis  Lovenox    Lab Results  Component Value Date   PLT 319 10/20/2016     Time Spent in minutes   2min  Greater than 50% of time spent in care coordination and counseling patient regarding the condition and plan of care.   Dustin Flock M.D on 10/20/2016 at 2:00 PM  Between 7am to 6pm - Pager - [phone]  After 6pm go to  [email or web address] - password [email or web address] [facility]   Office  [phone]

## 2016-10-21 DIAGNOSIS — K5732 Diverticulitis of large intestine without perforation or abscess without bleeding: Principal | ICD-10-CM

## 2016-10-21 LAB — CBC
HEMATOCRIT: 36.7 % (ref 35.0–47.0)
HEMOGLOBIN: 12.8 g/dL (ref 12.0–16.0)
MCH: 32.6 pg (ref 26.0–34.0)
MCHC: 34.8 g/dL (ref 32.0–36.0)
MCV: 93.7 fL (ref 80.0–100.0)
Platelets: 340 10*3/uL (ref 150–440)
RBC: 3.91 MIL/uL (ref 3.80–5.20)
RDW: 13.6 % (ref 11.5–14.5)
WBC: 10.2 10*3/uL (ref 3.6–11.0)

## 2016-10-21 LAB — BASIC METABOLIC PANEL
Anion gap: 7 (ref 5–15)
BUN: 5 mg/dL — AB (ref 6–20)
CHLORIDE: 108 mmol/L (ref 101–111)
CO2: 26 mmol/L (ref 22–32)
CREATININE: 0.5 mg/dL (ref 0.44–1.00)
Calcium: 8.4 mg/dL — ABNORMAL LOW (ref 8.9–10.3)
GFR calc Af Amer: >60 mL/min (ref >60)
GFR calc non Af Amer: >60 mL/min (ref >60)
Glucose, Bld: 101 mg/dL — ABNORMAL HIGH (ref 65–99)
Potassium: 3.4 mmol/L — ABNORMAL LOW (ref 3.5–5.1)
Sodium: 141 mmol/L (ref 135–145)

## 2016-10-21 LAB — PROCALCITONIN: Procalcitonin: <0.1 ng/mL

## 2016-10-21 LAB — HIV ANTIBODY (ROUTINE TESTING W REFLEX): HIV Screen 4th Generation wRfx: NONREACTIVE

## 2016-10-21 MED ORDER — POTASSIUM CHLORIDE CRYS ER 20 MEQ PO TBCR
20.0000 meq | EXTENDED_RELEASE_TABLET | Freq: Once | ORAL | Status: AC
  Administered 2016-10-21: 20 meq via ORAL
  Filled 2016-10-21: qty 1

## 2016-10-21 NOTE — Consult Note (Signed)
Patient ID: Morgan Cisneros, female   DOB: [birth year], 51 y.o.   MRN: [id number]  CC: Diverticulitis  HPI Morgan Cisneros is a 51 y.o. female who is currently admitted to the medicine service with recurrent sigmoid diverticulitis. Patient reports that she was treated as an outpatient for diverticulitis approximately a month ago that had good response. She was symptom-free for approximately a week when the pain recurred. She was restarted on outpatient antibiotics but continued to worsen which prompted her to come to the emergency department. At that time previously she was found to have recurrent diverticulitis. As she worsen she will return to the emergency department 2 days ago which showed continued diverticulitis. Since admission she reports that her abdominal pain has become intermittent but has not gone away. She has had bowel function. She continues to have intermittent nausea but usually after pain medications. She denies any fevers, chills, chest pain, shortness of breath, vomiting, diarrhea, constipation. She is otherwise in her usual state of health. Surgery was consults and secondary to the continued diverticulitis.  HPI  Past Medical History:  Diagnosis Date  . Abdominal pain   . Flank pain   . Gross hematuria   . History of kidney stones   . Insomnia   . Ovarian cyst   . Renal calculus, right   . Thoracic back pain    Right sided    Past Surgical History:  Procedure Laterality Date  . ABDOMINAL HYSTERECTOMY  08/2013  . ABDOMINAL HYSTERECTOMY    . BREAST BIOPSY Left 08/10/2016   [place] core  . CYSTOSCOPY  11/26/2010   with stent placement  . HEMORRHOID SURGERY    . kidney stent    . LITHOTRIPSY     X 4  . TONSILLECTOMY    . TUBAL LIGATION    . Ureteroscopy Right 11/21/1997   with holmium laser    Family History  Problem Relation Age of Onset  . Kidney Stones Mother   . Ovarian cancer Mother   . Colon cancer Mother   . Lung cancer Father   . Hyperlipidemia  Sister   . Hypertension Sister   . Alzheimer's disease Maternal Grandmother   . Cancer Maternal Grandfather        Liver  . Alzheimer's disease Paternal Grandfather   . Hypertension Sister   . Hyperlipidemia Sister   . Breast cancer Neg Hx     Social History Social History  Substance Use Topics  . Smoking status: Current Every Day Smoker    Packs/day: 0.50  . Smokeless tobacco: Never Used  . Alcohol use 0.0 oz/week     Comment: occasional    Allergies  Allergen Reactions  . Urocit-K [Potassium Citrate] Nausea Only    Current Facility-Administered Medications  Medication Dose Route Frequency Provider Last Rate Last Dose  . 0.9 %  sodium chloride infusion   Intravenous Continuous Baxter Hire, MD 100 mL/hr at 10/21/16 0532    . acetaminophen (TYLENOL) tablet 650 mg  650 mg Oral Q6H PRN Baxter Hire, MD       Or  . acetaminophen (TYLENOL) suppository 650 mg  650 mg Rectal Q6H PRN Baxter Hire, MD      . Ampicillin-Sulbactam (UNASYN) 3 g in sodium chloride 0.9 % 100 mL IVPB  3 g Intravenous Q6H Dustin Flock, MD   Stopped at 10/21/16 7353  . docusate sodium (COLACE) capsule 100 mg  100 mg Oral BID Dustin Flock, MD   100 mg at  10/21/16 3532  . DULoxetine (CYMBALTA) DR capsule 40 mg  40 mg Oral Daily Baxter Hire, MD   40 mg at 10/21/16 9924  . enoxaparin (LOVENOX) injection 40 mg  40 mg Subcutaneous Q24H Baxter Hire, MD   40 mg at 10/20/16 2120  . feeding supplement (BOOST / RESOURCE BREEZE) liquid 1 Container  1 Container Oral TID BM Dustin Flock, MD   1 Container at 10/21/16 1000  . HYDROmorphone (DILAUDID) injection 0.25 mg  0.25 mg Intravenous Q2H PRN Dustin Flock, MD   0.25 mg at 10/21/16 1150  . LORazepam (ATIVAN) tablet 1 mg  1 mg Oral QHS Baxter Hire, MD   1 mg at 10/20/16 2120  . ondansetron (ZOFRAN) injection 4 mg  4 mg Intravenous Q6H PRN Lance Coon, MD   4 mg at 10/21/16 2683     Review of Systems A Multi-point review of  systems was asked and was negative except for the findings documented in the history of present illness  Physical Exam Blood pressure 126/83, pulse 79, temperature 98.6 F (37 C), temperature source Oral, resp. rate 14, height 5\' 5"  (1.651 m), weight 63.5 kg (140 lb), SpO2 100 %. CONSTITUTIONAL: Resting in bed in no acute distress. EYES: Pupils are equal, round, and reactive to light, Sclera are non-icteric. EARS, NOSE, MOUTH AND THROAT: The oropharynx is clear. The oral mucosa is pink and moist. Hearing is intact to voice. LYMPH NODES:  Lymph nodes in the neck are normal. RESPIRATORY:  Lungs are clear. There is normal respiratory effort, with equal breath sounds bilaterally, and without pathologic use of accessory muscles. CARDIOVASCULAR: Heart is regular without murmurs, gallops, or rubs. GI: The abdomen is soft, moderately tender to palpation to the left upper quadrant and left lower quadrant but without rebound or guarding, and nondistended. There are no palpable masses. There is no hepatosplenomegaly. There are normal bowel sounds in all quadrants. GU: Rectal deferred.   MUSCULOSKELETAL: Normal muscle strength and tone. No cyanosis or edema.   SKIN: Turgor is good and there are no pathologic skin lesions or ulcers. NEUROLOGIC: Motor and sensation is grossly normal. Cranial nerves are grossly intact. PSYCH:  Oriented to person, place and time. Affect is normal.  Data Reviewed Images and labs reviewed most recent labs show normal white blood cell count of 10.2. Her white blood cell count 12.6 on admission. Mild hypokalemia 3.4. CT scan on admission does show significant diverticulitis but without an abscess. I have personally reviewed the patient's imaging, laboratory findings and medical records.    Assessment    Diverticulitis    Plan    51 year old female with diverticulitis. Had a long conversation with the patient and her husband about how her laboratory values have been normal  for 2 days now. Discussed that the majority of time when antibiotics allow for labs normalize the pain is resolved by 72 hours afterwards. Discussed that should her symptoms not improve dramatically by tomorrow that it would be indicated to repeat her cross-sectional imaging to look for abscess or progression of disease. Discussed that should it show an abscess usually a percutaneous drain by radiology is all that is required. Discussed that should her diverticulitis worsen to rupture then urgent surgical intervention would be indicated. Discussed that this point she needs to be nothing by mouth and IV antibiotics and fluids need to be continued. We will reassess tomorrow morning to determine whether not CT scan is indicated. General surgery will continue to follow with you.  Time spent with the patient was 80 minutes, with more than 50% of the time spent in face-to-face education, counseling and care coordination.     Clayburn Pert, MD FACS General Surgeon 10/21/2016, 2:06 PM

## 2016-10-21 NOTE — Progress Notes (Signed)
[place] [facility]                                                                                                                                                                                  Patient Demographics   Morgan Cisneros, is a 51 y.o. female, DOB - [birth year], WCH:[id number]  Admit date - 10/19/2016   Admitting Physician Baxter Hire, MD  Outpatient Primary MD for the patient is Park Liter P, DO   LOS - 2  Subjective: Continues to complain of abdominal pain. States that much improvement   Review of Systems:   CONSTITUTIONAL: No documented fever. No fatigue, weakness. No weight gain, no weight loss.  EYES: No blurry or double vision.  ENT: No tinnitus. No postnasal drip. No redness of the oropharynx.  RESPIRATORY: No cough, no wheeze, no hemoptysis. No dyspnea.  CARDIOVASCULAR: No chest pain. No orthopnea. No palpitations. No syncope.  GASTROINTESTINAL: No nausea, no vomiting or diarrhea. + abdominal pain. No melena or hematochezia.  GENITOURINARY: No dysuria or hematuria.  ENDOCRINE: No polyuria or nocturia. No heat or cold intolerance.  HEMATOLOGY: No anemia. No bruising. No bleeding.  INTEGUMENTARY: No rashes. No lesions.  MUSCULOSKELETAL: No arthritis. No swelling. No gout.  NEUROLOGIC: No numbness, tingling, or ataxia. No seizure-type activity.  PSYCHIATRIC: No anxiety. No insomnia. No ADD.    Vitals:   Vitals:   10/20/16 1219 10/20/16 2033 10/21/16 0442 10/21/16 1251  BP: 126/81 129/87 (!) 109/58 126/83  Pulse: 81 78 85 79  Resp:  18 16 14   Temp: 98.1 F (36.7 C) 98.2 F (36.8 C) 98.5 F (36.9 C) 98.6 F (37 C)  TempSrc: Oral Oral Oral Oral  SpO2: 97% 99% 95% 100%  Weight:      Height:        Wt Readings from Last 3 Encounters:  10/19/16 140 lb (63.5 kg)  10/14/16 144 lb 4 oz (65.4 kg)  09/24/16 147 lb (66.7 kg)     Intake/Output Summary (Last 24 hours) at 10/21/16 1426 Last data filed at 10/21/16 1140   Gross per 24 hour  Intake             3301 ml  Output              850 ml  Net             2451 ml    Physical Exam:   GENERAL: Pleasant-appearing in no apparent distress.  HEAD, EYES, EARS, NOSE AND THROAT: Atraumatic, normocephalic. Extraocular muscles are intact. Pupils equal and reactive to light. Sclerae anicteric. No conjunctival injection. No oro-pharyngeal erythema.  NECK: Supple. There is no  jugular venous distention. No bruits, no lymphadenopathy, no thyromegaly.  HEART: Regular rate and rhythm,. No murmurs, no rubs, no clicks.  LUNGS: Clear to auscultation bilaterally. No rales or rhonchi. No wheezes.  ABDOMEN: Soft, flat, Positive llq tenderness. Has good bowel sounds. No hepatosplenomegaly appreciated.  EXTREMITIES: No evidence of any cyanosis, clubbing, or peripheral edema.  +2 pedal and radial pulses bilaterally.  NEUROLOGIC: The patient is alert, awake, and oriented x3 with no focal motor or sensory deficits appreciated bilaterally.  SKIN: Moist and warm with no rashes appreciated.  Psych: Not anxious, depressed LN: No inguinal LN enlargement    Antibiotics   Anti-infectives    Start     Dose/Rate Route Frequency Ordered Stop   10/20/16 1400  Ampicillin-Sulbactam (UNASYN) 3 g in sodium chloride 0.9 % 100 mL IVPB     3 g 200 mL/hr over 30 Minutes Intravenous Every 6 hours 10/20/16 1323     10/19/16 2000  ertapenem (INVANZ) 1 g in sodium chloride 0.9 % 50 mL IVPB  Status:  Discontinued     1 g 100 mL/hr over 30 Minutes Intravenous Every 24 hours 10/19/16 1907 10/20/16 1323   10/19/16 1458  piperacillin-tazobactam (ZOSYN) 3-0.375 GM/50ML IVPB    Comments:  Gwynn Burly   : cabinet override      10/19/16 1458 10/20/16 0314   10/19/16 1445  piperacillin-tazobactam (ZOSYN) IVPB 3.375 g     3.375 g 100 mL/hr over 30 Minutes Intravenous  Once 10/19/16 1432 10/19/16 1717      Medications   Scheduled Meds: . docusate sodium  100 mg Oral BID  . DULoxetine  40 mg Oral  Daily  . enoxaparin (LOVENOX) injection  40 mg Subcutaneous Q24H  . feeding supplement  1 Container Oral TID BM  . LORazepam  1 mg Oral QHS   Continuous Infusions: . sodium chloride 100 mL/hr at 10/21/16 0532  . ampicillin-sulbactam (UNASYN) IV 3 g (10/21/16 1423)   PRN Meds:.acetaminophen **OR** acetaminophen, HYDROmorphone (DILAUDID) injection, ondansetron (ZOFRAN) IV   Data Review:   Micro Results Recent Results (from the past 240 hour(s))  Microscopic Examination     Status: Abnormal   Collection Time: 10/14/16  8:49 AM  Result Value Ref Range Status   RBC, UA 3-10 (A) 0 - 2 /hpf Final   Epithelial Cells (non renal) 0-10 0 - 10 /hpf Final  Blood Culture (routine x 2)     Status: None (Preliminary result)   Collection Time: 10/19/16  2:48 PM  Result Value Ref Range Status   Specimen Description BLOOD BLOOD LEFT HAND  Final   Special Requests   Final    BOTTLES DRAWN AEROBIC AND ANAEROBIC Blood Culture adequate volume   Culture NO GROWTH 2 DAYS  Final   Report Status PENDING  Incomplete  Blood Culture (routine x 2)     Status: None (Preliminary result)   Collection Time: 10/19/16  2:48 PM  Result Value Ref Range Status   Specimen Description BLOOD RIGHT ANTECUBITAL  Final   Special Requests   Final    BOTTLES DRAWN AEROBIC AND ANAEROBIC Blood Culture adequate volume   Culture NO GROWTH 2 DAYS  Final   Report Status PENDING  Incomplete    Radiology Reports Ct Abdomen Pelvis W Contrast  Result Date: 10/19/2016 CLINICAL DATA:  Initial evaluation for acute diverticulitis, feeling outpatient management, possible recent abscess. EXAM: CT ABDOMEN AND PELVIS WITH CONTRAST TECHNIQUE: Multidetector CT imaging of the abdomen and pelvis was performed using the  standard protocol following bolus administration of intravenous contrast. CONTRAST:  186mL ISOVUE-300 IOPAMIDOL (ISOVUE-300) INJECTION 61% COMPARISON:  Prior CT from 10/14/2016. FINDINGS: Lower chest: Hazy subsegmental  atelectatic changes present dependently within the visualized lung bases. Visualized lungs are otherwise clear. Hepatobiliary: Scattered hypodensities noted throughout the liver, likely small cysts and/or possibly biliary hamartomas, grossly stable. Liver otherwise unremarkable. Gallbladder within normal limits. No biliary dilatation. Pancreas: Pancreas within normal limits. Spleen: Spleen within normal limits. Adrenals/Urinary Tract: The adrenal glands are normal. Kidneys equal in size with symmetric enhancement. Punctate 3 mm bilateral nonobstructive nephrolithiasis, greater on the right. No hydronephrosis. subcentimeter hypodensity at the lower pole the left kidney too small the characterize, but statistically likely reflects a small cyst. No focal enhancing renal mass. No hydroureter. Bladder within normal limits. Stomach/Bowel: Small hiatal hernia noted. Stomach otherwise unremarkable. No evidence for bowel obstruction. Appendix normal. Multiple diverticula again seen involving the descending and sigmoid colon. There is persistent inflammatory stranding about several diverticula at the sigmoid colon, consistent with acute diverticulitis. Previously question small diverticular abscess is not seen on today's study. This most likely reflected a small inflamed diverticulum upon retrospect. No evidence for perforation, abscess, or other complication identified on today's study. Overall, degree of inflammatory changes has worsened relative to recent exam. Anastomotic suture noted at the rectum. Moderate retained stool elsewhere within the colon, which may reflect underlying constipation. Vascular/Lymphatic: Mild atheromatous plaque within the infrarenal aorta. No aneurysm. Normal intravascular enhancement seen throughout the intra-abdominal aorta and its branch vessels. No adenopathy. Reproductive: Uterus is absent.  Ovaries not discretely identified. Other: Moderate volume free fluid within the pelvis, likely  reactive. No free intraperitoneal air. Musculoskeletal: No acute osseous abnormality. No worrisome lytic or blastic osseous lesions. IMPRESSION: 1. Findings consistent with acute diverticulitis, progressed/worsened relative to recent CT from 10/14/2016. No discrete abscess identified on today's examination. No evidence for perforation or other complication. 2. Moderate diffuse stool throughout the remainder of the colon, suggesting underlying constipation. 3. Bilateral nonobstructive nephrolithiasis. Electronically Signed   By: Jeannine Boga M.D.   On: 10/19/2016 17:04   Ct Abdomen Pelvis W Contrast  Result Date: 10/14/2016 CLINICAL DATA:  BILATERAL lower abdominal pain and BILATERAL flank pain for 3 weeks, nausea, history diverticulitis, kidney stones, ovarian cyst, smoker, GERD EXAM: CT ABDOMEN AND PELVIS WITH CONTRAST TECHNIQUE: Multidetector CT imaging of the abdomen and pelvis was performed using the standard protocol following bolus administration of intravenous contrast. Sagittal and coronal MPR images reconstructed from axial data set. CONTRAST:  121mL ISOVUE-300 IOPAMIDOL (ISOVUE-300) INJECTION 61% IV. Dilute oral contrast. COMPARISON:  03/11/2014 FINDINGS: Lower chest: Minimal dependent atelectasis in posterior lungs Hepatobiliary: Tiny hepatic cysts largest 12 mm greatest size. Minimal focal fatty infiltration of liver adjacent to falciform fissure. Gallbladder and liver otherwise unremarkable. Pancreas: Normal appearance Spleen: Normal appearance Adrenals/Urinary Tract: Adrenal glands normal appearance. Tiny BILATERAL nonobstructing renal calculi. Kidneys, ureters, and bladder otherwise unremarkable. Stomach/Bowel: Normal appendix, retrocecal. Mild wall thickening of the ascending colon extending to hepatic flexure question artifact from underdistention versus colitis. In addition, numerous diverticula of the descending and sigmoid colon with wall thickening and pericolic inflammatory changes  at the descending sigmoid junction consistent with acute diverticulitis. Tiny fluid collection at site of inflammation questionably a tiny diverticular abscess, 10 x 7 x 6 mm in size. Stomach and small bowel loops unremarkable. Vascular/Lymphatic: Minimal atherosclerotic calcification aorta and iliac arteries. Aorta normal caliber. No adenopathy. Reproductive: Uterus surgically absent. Nonvisualization of ovaries. Other: No free air or free  fluid.  No hernia. Musculoskeletal: No acute osseous findings. IMPRESSION: Diverticulosis of the descending and sigmoid colon with focal acute diverticulitis at the descending sigmoid junction. Questionable tiny diverticular abscess 10 x 7 x 6 mm. Wall thickening of the ascending colon extending to the hepatic flexure, favor colitis though cannot completely exclude artifact from incomplete distention. Tiny BILATERAL nonobstructing renal calculi. Aortic Atherosclerosis (ICD10-I70.0). Electronically Signed   By: Lavonia Dana M.D.   On: 10/14/2016 11:35     CBC  Recent Labs Lab 10/19/16 1341 10/20/16 0437 10/21/16 0501  WBC 12.6* 10.3 10.2  HGB 15.8 13.5 12.8  HCT 46.4 38.4 36.7  PLT 396 319 340  MCV 93.1 94.7 93.7  MCH 31.8 33.3 32.6  MCHC 34.1 35.2 34.8  RDW 13.8 13.6 13.6    Chemistries   Recent Labs Lab 10/19/16 1341 10/21/16 0501  NA 141 141  K 4.0 3.4*  CL 106 108  CO2 25 26  GLUCOSE 90 101*  BUN 9 5*  CREATININE 0.66 0.50  CALCIUM 9.7 8.4*  AST 29  --   ALT 42  --   ALKPHOS 133*  --   BILITOT 0.6  --    ------------------------------------------------------------------------------------------------------------------ estimated creatinine clearance is 76.5 mL/min (by C-G formula based on SCr of 0.5 mg/dL). ------------------------------------------------------------------------------------------------------------------ No results for input(s): HGBA1C in the last 72  hours. ------------------------------------------------------------------------------------------------------------------ No results for input(s): CHOL, HDL, LDLCALC, TRIG, CHOLHDL, LDLDIRECT in the last 72 hours. ------------------------------------------------------------------------------------------------------------------ No results for input(s): TSH, T4TOTAL, T3FREE, THYROIDAB in the last 72 hours.  Invalid input(s): FREET3 ------------------------------------------------------------------------------------------------------------------ No results for input(s): VITAMINB12, FOLATE, FERRITIN, TIBC, IRON, RETICCTPCT in the last 72 hours.  Coagulation profile No results for input(s): INR, PROTIME in the last 168 hours.  No results for input(s): DDIMER in the last 72 hours.  Cardiac Enzymes No results for input(s): CKMB, TROPONINI, MYOGLOBIN in the last 168 hours.  Invalid input(s): CK ------------------------------------------------------------------------------------------------------------------ Invalid input(s): [place]   1. acute Diverticulitis. She has failed outpatient therapy.Continue Unasyn.no improvement was scheduled to see 2. Severe constipation improved with Colace 3. Nicotine abuse states only smokes a few cigarettes I strongly recommended she stop smoking she states that she is not        Code Status Orders        Start     Ordered   10/19/16 1908  Full code  Continuous     10/19/16 1907    Code Status History    Date Active Date Inactive Code Status Order ID Comments User Context   This patient has a current code status but no historical code status.           Consultsnone  DVT Prophylaxis  Lovenox    Lab Results  Component Value Date   PLT 340 10/21/2016     Time Spent in minutes   40min  Greater than 50% of time spent in care coordination and counseling patient regarding the condition and plan of care.   Dustin Flock M.D on 10/21/2016 at 2:26 PM  Between 7am to 6pm - Pager - [phone]  After 6pm go to [email or web address] - password [email or web address] [facility]   Office  [phone]

## 2016-10-21 NOTE — Discharge Instructions (Signed)
Diverticulitis Diverticulitis is inflammation or infection of small pouches in your colon that form when you have a condition called diverticulosis. The pouches in your colon are called diverticula. Your colon, or large intestine, is where water is absorbed and stool is formed. Complications of diverticulitis can include:  Bleeding.  Severe infection.  Severe pain.  Perforation of your colon.  Obstruction of your colon.  What are the causes? Diverticulitis is caused by bacteria. Diverticulitis happens when stool becomes trapped in diverticula. This allows bacteria to grow in the diverticula, which can lead to inflammation and infection. What increases the risk? People with diverticulosis are at risk for diverticulitis. Eating a diet that does not include enough fiber from fruits and vegetables may make diverticulitis more likely to develop. What are the signs or symptoms? Symptoms of diverticulitis may include:  Abdominal pain and tenderness. The pain is normally located on the left side of the abdomen, but may occur in other areas.  Fever and chills.  Bloating.  Cramping.  Nausea.  Vomiting.  Constipation.  Diarrhea.  Blood in your stool.  How is this diagnosed? Your health care provider will ask you about your medical history and do a physical exam. You may need to have tests done because many medical conditions can cause the same symptoms as diverticulitis. Tests may include:  Blood tests.  Urine tests.  Imaging tests of the abdomen, including X-rays and CT scans.  When your condition is under control, your health care provider may recommend that you have a colonoscopy. A colonoscopy can show how severe your diverticula are and whether something else is causing your symptoms. How is this treated? Most cases of diverticulitis are mild and can be treated at home. Treatment may include:  Taking over-the-counter pain medicines.  Following a clear liquid  diet.  Taking antibiotic medicines by mouth for 7-10 days.  More severe cases may be treated at a hospital. Treatment may include:  Not eating or drinking.  Taking prescription pain medicine.  Receiving antibiotic medicines through an IV tube.  Receiving fluids and nutrition through an IV tube.  Surgery.  Follow these instructions at home:  Follow your health care providers instructions carefully.  Follow a full liquid diet or other diet as directed by your health care provider. After your symptoms improve, your health care provider may tell you to change your diet. He or she may recommend you eat a high-fiber diet. Fruits and vegetables are good sources of fiber. Fiber makes it easier to pass stool.  Take fiber supplements or probiotics as directed by your health care provider.  Only take medicines as directed by your health care provider.  Keep all your follow-up appointments. Contact a health care provider if:  Your pain does not improve.  You have a hard time eating food.  Your bowel movements do not return to normal. Get help right away if:  Your pain becomes worse.  Your symptoms do not get better.  Your symptoms suddenly get worse.  You have a fever.  Diverticulitis Diverticulitis is inflammation or infection of small pouches in your colon that form when you have a condition called diverticulosis. The pouches in your colon are called diverticula. Your colon, or large intestine, is where water is absorbed and stool is formed. Complications of diverticulitis can include: Bleeding. Severe infection. Severe pain. Perforation of your colon. Obstruction of your colon.  What are the causes? Diverticulitis is caused by bacteria. Diverticulitis happens when stool becomes trapped in diverticula. This  allows bacteria to grow in the diverticula, which can lead to inflammation and infection. What increases the risk? People with diverticulosis are at risk for  diverticulitis. Eating a diet that does not include enough fiber from fruits and vegetables may make diverticulitis more likely to develop. What are the signs or symptoms? Symptoms of diverticulitis may include: Abdominal pain and tenderness. The pain is normally located on the left side of the abdomen, but may occur in other areas. Fever and chills. Bloating. Cramping. Nausea. Vomiting. Constipation. Diarrhea. Blood in your stool.  How is this diagnosed? Your health care provider will ask you about your medical history and do a physical exam. You may need to have tests done because many medical conditions can cause the same symptoms as diverticulitis. Tests may include: Blood tests. Urine tests. Imaging tests of the abdomen, including X-rays and CT scans.  When your condition is under control, your health care provider may recommend that you have a colonoscopy. A colonoscopy can show how severe your diverticula are and whether something else is causing your symptoms. How is this treated? Most cases of diverticulitis are mild and can be treated at home. Treatment may include: Taking over-the-counter pain medicines. Following a clear liquid diet. Taking antibiotic medicines by mouth for 7-10 days.  More severe cases may be treated at a hospital. Treatment may include: Not eating or drinking. Taking prescription pain medicine. Receiving antibiotic medicines through an IV tube. Receiving fluids and nutrition through an IV tube. Surgery.  Follow these instructions at home: Follow your health care providers instructions carefully. Follow a full liquid diet or other diet as directed by your health care provider. After your symptoms improve, your health care provider may tell you to change your diet. He or she may recommend you eat a high-fiber diet. Fruits and vegetables are good sources of fiber. Fiber makes it easier to pass stool. Take fiber supplements or probiotics as directed by  your health care provider. Only take medicines as directed by your health care provider. Keep all your follow-up appointments. Contact a health care provider if: Your pain does not improve. You have a hard time eating food. Your bowel movements do not return to normal. Get help right away if: Your pain becomes worse. Your symptoms do not get better. Your symptoms suddenly get worse. You have a fever. You have repeated vomiting. You have bloody or black, tarry stools. This information is not intended to replace advice given to you by your health care provider. Make sure you discuss any questions you have with your health care provider. Document Released: 12/30/2004 Document Revised: 08/28/2015 Document Reviewed: 02/14/2013 Elsevier Interactive Patient Education  2017 [place] have repeated vomiting.  You have bloody or black, tarry stools. This information is not intended to replace advice given to you by your health care provider. Make sure you discuss any questions you have with your health care provider. Document Released: 12/30/2004 Document Revised: 08/28/2015 Document Reviewed: 02/14/2013 Elsevier Interactive Patient Education  2017 [employer].

## 2016-10-22 ENCOUNTER — Inpatient Hospital Stay: Payer: BC Managed Care – PPO

## 2016-10-22 LAB — CBC
HEMATOCRIT: 36.8 % (ref 35.0–47.0)
Hemoglobin: 12.8 g/dL (ref 12.0–16.0)
MCH: 33.2 pg (ref 26.0–34.0)
MCHC: 34.8 g/dL (ref 32.0–36.0)
MCV: 95.4 fL (ref 80.0–100.0)
Platelets: 339 10*3/uL (ref 150–440)
RBC: 3.86 MIL/uL (ref 3.80–5.20)
RDW: 13.3 % (ref 11.5–14.5)
WBC: 8.2 10*3/uL (ref 3.6–11.0)

## 2016-10-22 LAB — BASIC METABOLIC PANEL
ANION GAP: 7 (ref 5–15)
BUN: <5 mg/dL — ABNORMAL LOW (ref 6–20)
CALCIUM: 8.4 mg/dL — AB (ref 8.9–10.3)
CO2: 27 mmol/L (ref 22–32)
Chloride: 106 mmol/L (ref 101–111)
Creatinine, Ser: 0.61 mg/dL (ref 0.44–1.00)
GLUCOSE: 92 mg/dL (ref 65–99)
POTASSIUM: 3.4 mmol/L — AB (ref 3.5–5.1)
Sodium: 140 mmol/L (ref 135–145)

## 2016-10-22 MED ORDER — IOPAMIDOL (ISOVUE-300) INJECTION 61%
100.0000 mL | Freq: Once | INTRAVENOUS | Status: AC | PRN
  Administered 2016-10-22: 100 mL via INTRAVENOUS

## 2016-10-22 MED ORDER — SODIUM CHLORIDE 0.9 % IV SOLN
1.0000 g | Freq: Three times a day (TID) | INTRAVENOUS | Status: DC
  Administered 2016-10-22 – 2016-10-23 (×4): 1 g via INTRAVENOUS
  Filled 2016-10-22 (×6): qty 1

## 2016-10-22 MED ORDER — IOPAMIDOL (ISOVUE-300) INJECTION 61%: 30.0000 mL | Freq: Once | INTRAVENOUS | Status: DC | PRN

## 2016-10-22 MED ORDER — HYDROMORPHONE HCL 1 MG/ML IJ SOLN
1.0000 mg | INTRAMUSCULAR | Status: DC | PRN
  Administered 2016-10-22 – 2016-10-25 (×20): 1 mg via INTRAVENOUS
  Filled 2016-10-22 (×20): qty 1

## 2016-10-22 NOTE — Progress Notes (Signed)
SURGICAL PROGRESS NOTE (cpt [phone])  Hospital Day(s): 3.   Post op day(s):  Marland Kitchen   Interval History: Patient seen and examined, no acute events or new complaints overnight. Patient continues to, however, report rather severe little-improved pelvic abdominal pain despite antibiotics. She otherwise reports +flatus and denies fever/chills, N/V, CP, or SOB.  Review of Systems:  Constitutional: denies fever, chills  HEENT: denies cough or congestion  Respiratory: denies any shortness of breath  Cardiovascular: denies chest pain or palpitations  Gastrointestinal: abdominal pain, N/V, and bowel function as per interval history Genitourinary: denies burning with urination or urinary frequency Musculoskeletal: denies pain, decreased motor or sensation Integumentary: denies any other rashes or skin discolorations Neurological: denies HA or vision/hearing changes   Vital signs in last 24 hours: [min-max] current  Temp:  [98.4 F (36.9 C)-98.6 F (37 C)] 98.5 F (36.9 C) (07/20 0502) Pulse Rate:  [76-79] 76 (07/20 0502) Resp:  [14-16] 16 (07/20 0502) BP: (116-130)/(80-91) 130/91 (07/20 0502) SpO2:  [98 %-100 %] 98 % (07/20 0502)     Height: 5\' 5"  (165.1 cm) Weight: 140 lb (63.5 kg) BMI (Calculated): 23.3   Intake/Output this shift:  No intake/output data recorded.   Intake/Output last 2 shifts:  @IOLAST2SHIFTS @   Physical Exam:  Constitutional: alert, cooperative and no distress  HENT: normocephalic without obvious abnormality  Eyes: PERRL, EOM's grossly intact and symmetric  Neuro: CN II - XII grossly intact and symmetric without deficit  Respiratory: breathing non-labored at rest  Cardiovascular: regular rate and sinus rhythm  Gastrointestinal: soft and non-distended with moderately severe LLQ > suprapubic tenderness to palpation, no guarding or rebound tenderness Musculoskeletal: UE and LE FROM, no edema or wounds, motor and sensation grossly intact, NT   Labs:  CBC Latest Ref  Rng & Units 10/22/2016 10/21/2016 10/20/2016  WBC 3.6 - 11.0 K/uL 8.2 10.2 10.3  Hemoglobin 12.0 - 16.0 g/dL 12.8 12.8 13.5  Hematocrit 35.0 - 47.0 % 36.8 36.7 38.4  Platelets 150 - 440 K/uL 339 340 319   CMP Latest Ref Rng & Units 10/22/2016 10/21/2016 10/19/2016  Glucose 65 - 99 mg/dL 92 101(H) 90  BUN 6 - 20 mg/dL <5(L) 5(L) 9  Creatinine 0.44 - 1.00 mg/dL 0.61 0.50 0.66  Sodium 135 - 145 mmol/L 140 141 141  Potassium 3.5 - 5.1 mmol/L 3.4(L) 3.4(L) 4.0  Chloride 101 - 111 mmol/L 106 108 106  CO2 22 - 32 mmol/L 27 26 25   Calcium 8.9 - 10.3 mg/dL 8.4(L) 8.4(L) 9.7  Total Protein 6.5 - 8.1 g/dL - - 7.7  Total Bilirubin 0.3 - 1.2 mg/dL - - 0.6  Alkaline Phos 38 - 126 U/L - - 133(H)  AST 15 - 41 U/L - - 29  ALT 14 - 54 U/L - - 42   Imaging studies: No new pertinent imaging studies   Assessment/Plan: (ICD-10's: K20.32) 51 y.o. 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   - NPO, IV fluids  - pain control as needed  - IV antibiotics (currently on Unasyn)  - repeat CT to assess for abscess in context of pain not improving despite antibiotics  - if no abscess on CT, may consider switching antibiotics, possibly to Zosyn  - medical management of comorbidities as per medical  team  - ambulation encouraged, DVT prophylaxis  All of the above findings and recommendations were discussed with the patient, patient's husband, and patient's RN, and all of patient's and family's questions were answered to their expressed satisfaction.  Thank you for the opportunity to participate in this patient's care.  -- Marilynne Drivers Rosana Hoes, MD, [place]: [facility] - Partnering for exceptional care. Office: [phone]

## 2016-10-22 NOTE — Progress Notes (Addendum)
Pharmacy Antibiotic Note  Morgan Cisneros is a 51 y.o. female admitted on 10/19/2016 with Diverticulitis.  CT today (7/20) shows acute uncomplicated diverticulitis involving the sigmoid colon within the left lower abdomen/pelvis. Amount of adjacent mesenteric stranding and inflammation has continued to progress compared to the 07/17 examination.  Patient was receiving Unasyn, but due to progression pharmacy has been consulted for Meropenem dosing.  Plan: Will start the patient on meropenem 1g IV every 8 hours.   Height: 5\' 5"  (165.1 cm) Weight: 140 lb (63.5 kg) IBW/kg (Calculated) : 57  Temp (24hrs), Avg:98.5 F (36.9 C), Min:98.4 F (36.9 C), Max:98.5 F (36.9 C)   Recent Labs Lab 10/19/16 1341 10/19/16 1448 10/20/16 0437 10/21/16 0501 10/22/16 0548  WBC 12.6*  --  10.3 10.2 8.2  CREATININE 0.66  --   --  0.50 0.61  LATICACIDVEN  --  1.1  --   --   --     Estimated Creatinine Clearance: 76.5 mL/min (by C-G formula based on SCr of 0.61 mg/dL).    Allergies  Allergen Reactions  . Urocit-K [Potassium Citrate] Nausea Only    Antimicrobials this admission: 7/17: Ertapenem >> x 1 dose  7/18 Unasyn  >> 7/20 7/20 meropenem  >>   Dose adjustments this admission:  Microbiology results: 7/17  BCx:  NG x 3 days   Thank you for allowing pharmacy to be a part of this patient's care.  Pernell Dupre, PharmD, BCPS Clinical Pharmacist 10/22/2016 1:39 PM

## 2016-10-22 NOTE — Consult Note (Signed)
[facility] Infectious Disease     Reason for Consult: Diverticulitis,    Referring Physician: Serita Grit Date of Admission:  10/19/2016   Active Problems:   Diverticulitis   Diverticulitis of large intestine without perforation or abscess without bleeding   HPI: Morgan Cisneros is a 51 y.o. female admitted with abd pain for one month, previously treated with cipro and flaygl. Sxs improved but then recurred a week later. She had CT showed diverticulitis with small abscess. Restarted cipro and flagyl but worsened. On admit no fever, wbc 12. Seen by surgery.  Was on unasyn for  Few days but pain continued so had 3rd CT done today. She reports pain persists bil lower quadrants and is about a 5/10. Just received dilaudid.    Past Medical History:  Diagnosis Date  . Abdominal pain   . Flank pain   . Gross hematuria   . History of kidney stones   . Insomnia   . Ovarian cyst   . Renal calculus, right   . Thoracic back pain    Right sided   Past Surgical History:  Procedure Laterality Date  . ABDOMINAL HYSTERECTOMY  08/2013  . ABDOMINAL HYSTERECTOMY    . BREAST BIOPSY Left 08/10/2016   [place] core  . CYSTOSCOPY  11/26/2010   with stent placement  . HEMORRHOID SURGERY    . kidney stent    . LITHOTRIPSY     X 4  . TONSILLECTOMY    . TUBAL LIGATION    . Ureteroscopy Right 11/21/1997   with holmium laser   Social History  Substance Use Topics  . Smoking status: Current Every Day Smoker    Packs/day: 0.50  . Smokeless tobacco: Never Used  . Alcohol use 0.0 oz/week     Comment: occasional   Family History  Problem Relation Age of Onset  . Kidney Stones Mother   . Ovarian cancer Mother   . Colon cancer Mother   . Lung cancer Father   . Hyperlipidemia Sister   . Hypertension Sister   . Alzheimer's disease Maternal Grandmother   . Cancer Maternal Grandfather        Liver  . Alzheimer's disease Paternal Grandfather   . Hypertension Sister   . Hyperlipidemia Sister   .  Breast cancer Neg Hx     Allergies:  Allergies  Allergen Reactions  . Urocit-K [Potassium Citrate] Nausea Only    Current antibiotics: Antibiotics Given (last 72 hours)    Date/Time Action Medication Dose Rate   10/19/16 2043 New Bag/Given   ertapenem (INVANZ) 1 g in sodium chloride 0.9 % 50 mL IVPB 1 g 100 mL/hr   10/20/16 1435 New Bag/Given   Ampicillin-Sulbactam (UNASYN) 3 g in sodium chloride 0.9 % 100 mL IVPB 3 g 200 mL/hr   10/20/16 2042 New Bag/Given   Ampicillin-Sulbactam (UNASYN) 3 g in sodium chloride 0.9 % 100 mL IVPB 3 g 200 mL/hr   10/21/16 0138 New Bag/Given   Ampicillin-Sulbactam (UNASYN) 3 g in sodium chloride 0.9 % 100 mL IVPB 3 g 200 mL/hr   10/21/16 0733 New Bag/Given   Ampicillin-Sulbactam (UNASYN) 3 g in sodium chloride 0.9 % 100 mL IVPB 3 g 200 mL/hr   10/21/16 1423 New Bag/Given   Ampicillin-Sulbactam (UNASYN) 3 g in sodium chloride 0.9 % 100 mL IVPB 3 g 200 mL/hr   10/21/16 2045 New Bag/Given   Ampicillin-Sulbactam (UNASYN) 3 g in sodium chloride 0.9 % 100 mL IVPB 3 g 200 mL/hr  10/22/16 0203 New Bag/Given   Ampicillin-Sulbactam (UNASYN) 3 g in sodium chloride 0.9 % 100 mL IVPB 3 g 200 mL/hr   10/22/16 0820 New Bag/Given   Ampicillin-Sulbactam (UNASYN) 3 g in sodium chloride 0.9 % 100 mL IVPB 3 g 200 mL/hr   10/22/16 1437 New Bag/Given   meropenem (MERREM) 1 g in sodium chloride 0.9 % 100 mL IVPB 1 g 200 mL/hr      MEDICATIONS: . docusate sodium  100 mg Oral BID  . DULoxetine  40 mg Oral Daily  . enoxaparin (LOVENOX) injection  40 mg Subcutaneous Q24H  . feeding supplement  1 Container Oral TID BM  . LORazepam  1 mg Oral QHS    Review of Systems - 11 systems reviewed and negative per HPI   OBJECTIVE: Temp:  [98.4 F (36.9 C)-98.5 F (36.9 C)] 98.4 F (36.9 C) (07/20 1222) Pulse Rate:  [74-76] 74 (07/20 1222) Resp:  [16] 16 (07/20 0502) BP: (116-130)/(80-91) 128/84 (07/20 1222) SpO2:  [98 %-100 %] 98 % (07/20 1222) Physical Exam   Constitutional:  oriented to person, place, and time. appears well-developed and well-nourished. No distress.  HENT: [place]/AT, PERRLA, no scleral icterus Mouth/Throat: Oropharynx is clear and moist. No oropharyngeal exudate.  Cardiovascular: Normal rate, regular rhythm and normal heart sounds. Exam reveals no gallop and no friction rub.  No murmur heard.  Pulmonary/Chest: Effort normal and breath sounds normal. No respiratory distress.  has no wheezes.  Neck = supple, no nuchal rigidity Abdominal: Soft. Bowel sounds are normal.  exhibits no distension. Mild diffuse ttp RLQ, mod LLQ, No RB G Lymphadenopathy: no cervical adenopathy. No axillary adenopathy Neurological: alert and oriented to person, place, and time.  Skin: Skin is warm and dry. No rash noted. No erythema.  Psychiatric: a normal mood and affect.  behavior is normal.    LABS: Results for orders placed or performed during the hospital encounter of 10/19/16 (from the past 48 hour(s))  CBC     Status: None   Collection Time: 10/21/16  5:01 AM  Result Value Ref Range   WBC 10.2 3.6 - 11.0 K/uL   RBC 3.91 3.80 - 5.20 MIL/uL   Hemoglobin 12.8 12.0 - 16.0 g/dL   HCT 36.7 35.0 - 47.0 %   MCV 93.7 80.0 - 100.0 fL   MCH 32.6 26.0 - 34.0 pg   MCHC 34.8 32.0 - 36.0 g/dL   RDW 13.6 11.5 - 14.5 %   Platelets 340 150 - 440 K/uL  Basic metabolic panel     Status: Abnormal   Collection Time: 10/21/16  5:01 AM  Result Value Ref Range   Sodium 141 135 - 145 mmol/L   Potassium 3.4 (L) 3.5 - 5.1 mmol/L   Chloride 108 101 - 111 mmol/L   CO2 26 22 - 32 mmol/L   Glucose, Bld 101 (H) 65 - 99 mg/dL   BUN 5 (L) 6 - 20 mg/dL   Creatinine, Ser 0.50 0.44 - 1.00 mg/dL   Calcium 8.4 (L) 8.9 - 10.3 mg/dL   GFR calc non Af Amer >60 >60 mL/min   GFR calc Af Amer >60 >60 mL/min    Comment: (NOTE) The eGFR has been calculated using the CKD EPI equation. This calculation has not been validated in all clinical situations. eGFR's persistently <60  mL/min signify possible Chronic Kidney Disease.    Anion gap 7 5 - 15  Procalcitonin - Baseline     Status: None   Collection Time: 10/21/16  5:01 AM  Result Value Ref Range   Procalcitonin <0.10 ng/mL    Comment:        Interpretation: PCT (Procalcitonin) <= 0.5 ng/mL: Systemic infection (sepsis) is not likely. Local bacterial infection is possible. (NOTE)         ICU PCT Algorithm               Non ICU PCT Algorithm    ----------------------------     ------------------------------         PCT < 0.25 ng/mL                 PCT < 0.1 ng/mL     Stopping of antibiotics            Stopping of antibiotics       strongly encouraged.               strongly encouraged.    ----------------------------     ------------------------------       PCT level decrease by               PCT < 0.25 ng/mL       >= 80% from peak PCT       OR PCT 0.25 - 0.5 ng/mL          Stopping of antibiotics                                             encouraged.     Stopping of antibiotics           encouraged.    ----------------------------     ------------------------------       PCT level decrease by              PCT >= 0.25 ng/mL       < 80% from peak PCT        AND PCT >= 0.5 ng/mL            Continuin g antibiotics                                              encouraged.       Continuing antibiotics            encouraged.    ----------------------------     ------------------------------     PCT level increase compared          PCT > 0.5 ng/mL         with peak PCT AND          PCT >= 0.5 ng/mL             Escalation of antibiotics                                          strongly encouraged.      Escalation of antibiotics        strongly encouraged.   CBC     Status: None   Collection Time: 10/22/16  5:48 AM  Result Value Ref Range   WBC 8.2 3.6 - 11.0 K/uL   RBC 3.86 3.80 - 5.20 MIL/uL  Hemoglobin 12.8 12.0 - 16.0 g/dL   HCT 36.8 35.0 - 47.0 %   MCV 95.4 80.0 - 100.0 fL   MCH 33.2 26.0 -  34.0 pg   MCHC 34.8 32.0 - 36.0 g/dL   RDW 13.3 11.5 - 14.5 %   Platelets 339 150 - 440 K/uL  Basic metabolic panel     Status: Abnormal   Collection Time: 10/22/16  5:48 AM  Result Value Ref Range   Sodium 140 135 - 145 mmol/L   Potassium 3.4 (L) 3.5 - 5.1 mmol/L   Chloride 106 101 - 111 mmol/L   CO2 27 22 - 32 mmol/L   Glucose, Bld 92 65 - 99 mg/dL   BUN <5 (L) 6 - 20 mg/dL   Creatinine, Ser 0.61 0.44 - 1.00 mg/dL   Calcium 8.4 (L) 8.9 - 10.3 mg/dL   GFR calc non Af Amer >60 >60 mL/min   GFR calc Af Amer >60 >60 mL/min    Comment: (NOTE) The eGFR has been calculated using the CKD EPI equation. This calculation has not been validated in all clinical situations. eGFR's persistently <60 mL/min signify possible Chronic Kidney Disease.    Anion gap 7 5 - 15   No components found for: ESR, C REACTIVE PROTEIN MICRO: Recent Results (from the past 720 hour(s))  Microscopic Examination     Status: Abnormal   Collection Time: 09/24/16  3:12 PM  Result Value Ref Range Status   RBC, UA 3-10 (A) 0 - 2 /hpf Final   Epithelial Cells (non renal) 0-10 0 - 10 /hpf Final  Microscopic Examination     Status: Abnormal   Collection Time: 10/14/16  8:49 AM  Result Value Ref Range Status   RBC, UA 3-10 (A) 0 - 2 /hpf Final   Epithelial Cells (non renal) 0-10 0 - 10 /hpf Final  Blood Culture (routine x 2)     Status: None (Preliminary result)   Collection Time: 10/19/16  2:48 PM  Result Value Ref Range Status   Specimen Description BLOOD BLOOD LEFT HAND  Final   Special Requests   Final    BOTTLES DRAWN AEROBIC AND ANAEROBIC Blood Culture adequate volume   Culture NO GROWTH 3 DAYS  Final   Report Status PENDING  Incomplete  Blood Culture (routine x 2)     Status: None (Preliminary result)   Collection Time: 10/19/16  2:48 PM  Result Value Ref Range Status   Specimen Description BLOOD RIGHT ANTECUBITAL  Final   Special Requests   Final    BOTTLES DRAWN AEROBIC AND ANAEROBIC Blood Culture  adequate volume   Culture NO GROWTH 3 DAYS  Final   Report Status PENDING  Incomplete    IMAGING: Ct Abdomen Pelvis W Contrast  Result Date: 10/22/2016 CLINICAL DATA:  Recent diagnosis of diverticulitis. Patient with persistent and recurrent abdominal pain and nausea. EXAM: CT ABDOMEN AND PELVIS WITH CONTRAST TECHNIQUE: Multidetector CT imaging of the abdomen and pelvis was performed using the standard protocol following bolus administration of intravenous contrast. CONTRAST:  185m ISOVUE-300 IOPAMIDOL (ISOVUE-300) INJECTION 61% COMPARISON:  10/19/2016; 10/14/2016; 03/11/2014 FINDINGS: Lower chest: Limited visualization of lower thorax demonstrates minimal dependent subpleural ground-glass atelectasis. No focal airspace opacities. No pleural effusion. Normal heart size. No pericardial effusion. Hepatobiliary: Normal hepatic contour. Re- demonstrated approximately 1.2 cm hypoattenuating (10 Hounsfield unit) cyst within in the dome of the right lobe of the liver. Additional hypoattenuating renal lesions are too small to adequately characterize of favored to  represent additional renal cysts. Normal appearance of the gallbladder given degree distention. No radiopaque gallstones. No intra extrahepatic bili duct dilatation. There is a small amount of fluid tracking about the caudal aspect of the liver (image 34, series 2). Pancreas: Normal appearance of the pancreas Spleen: Normal appearance of the spleen, however a small amount of fluid is seen adjacent to the splenic contour within left upper abdominal quadrant (image 20, series 2). Adrenals/Urinary Tract: There is symmetric enhancement and excretion of the bilateral kidneys. Multiple punctate (sub 2 mm) nonobstructing renal stones are seen bilaterally with cluster of non obstructing stones within the superior pole the right kidney is seen on coronal image 59, series 5 and solitary punctate (approximately 1 mm) nonobstructing left renal stent stone seen on  coronal image 56, series 5). No renal stones are seen along the expected course of either ureter or the urinary bladder. No urine obstruction or perinephric stranding. Normal appearance of the bilateral adrenal glands. There is mass effect of the inflammatory process within the sigmoid colon on the left-sided the dome of the urinary bladder. The urinary bladder appears otherwise normal. Stomach/Bowel: Ingested enteric contrast extensive the level of the rectum. Extensive colonic diverticulosis. Re- demonstrated extensive wall thickening involving the sigmoid colon within the left lower abdomen/ pelvis (representative axial images 63 through 69, series 2). Adjacent mesenteric stranding however there is no evidence of perforation or definable/ drainable fluid collection. Overall, the amount of inflammation wall thickening has progressed compared to the 7/17 examination. The cecum is noted to be located within the right mid hemiabdomen. The bowel is otherwise normal in course and caliber without evidence of enteric obstruction. Normal appearance of the terminal ileum and retrocecal appendix. Vascular/Lymphatic: Minimal amount of mixed calcified and noncalcified atherosclerotic plaque with a normal caliber abdominal aorta. The major branch vessels of the abdominal aorta appear patent on this non CTA examination. Scattered retroperitoneal lymph nodes are numerous though individually not enlarged by size criteria with index left common iliac chain lymph node measuring 0.9 cm in diameter (image 47, series 2), likely reactive in etiology. No bulky retroperitoneal, mesenteric, pelvic or inguinal lymphadenopathy. Reproductive: Post hysterectomy. No discrete adnexal lesion. There is a small amount of fluid within the pelvic cul-de-sac. Other: Tiny mesenteric fat containing periumbilical hernia. Musculoskeletal: No acute or aggressive osseous abnormalities. IMPRESSION: 1. Findings suggestive of acute uncomplicated  diverticulitis involving the sigmoid colon within the left lower abdomen/pelvis - while there is no evidence of perforation or definable/drainable fluid collection, the amount of adjacent mesenteric stranding and inflammation has continued to progress compared to the 07/17 examination. 2. No evidence of enteric obstruction. 3. Bilateral nonobstructing nephrolithiasis. 4.  Aortic Atherosclerosis (ICD10-I70.0). Electronically Signed   By: Sandi Mariscal M.D.   On: 10/22/2016 12:26   Ct Abdomen Pelvis W Contrast  Result Date: 10/19/2016 CLINICAL DATA:  Initial evaluation for acute diverticulitis, feeling outpatient management, possible recent abscess. EXAM: CT ABDOMEN AND PELVIS WITH CONTRAST TECHNIQUE: Multidetector CT imaging of the abdomen and pelvis was performed using the standard protocol following bolus administration of intravenous contrast. CONTRAST:  132m ISOVUE-300 IOPAMIDOL (ISOVUE-300) INJECTION 61% COMPARISON:  Prior CT from 10/14/2016. FINDINGS: Lower chest: Hazy subsegmental atelectatic changes present dependently within the visualized lung bases. Visualized lungs are otherwise clear. Hepatobiliary: Scattered hypodensities noted throughout the liver, likely small cysts and/or possibly biliary hamartomas, grossly stable. Liver otherwise unremarkable. Gallbladder within normal limits. No biliary dilatation. Pancreas: Pancreas within normal limits. Spleen: Spleen within normal limits. Adrenals/Urinary Tract: The adrenal  glands are normal. Kidneys equal in size with symmetric enhancement. Punctate 3 mm bilateral nonobstructive nephrolithiasis, greater on the right. No hydronephrosis. subcentimeter hypodensity at the lower pole the left kidney too small the characterize, but statistically likely reflects a small cyst. No focal enhancing renal mass. No hydroureter. Bladder within normal limits. Stomach/Bowel: Small hiatal hernia noted. Stomach otherwise unremarkable. No evidence for bowel obstruction.  Appendix normal. Multiple diverticula again seen involving the descending and sigmoid colon. There is persistent inflammatory stranding about several diverticula at the sigmoid colon, consistent with acute diverticulitis. Previously question small diverticular abscess is not seen on today's study. This most likely reflected a small inflamed diverticulum upon retrospect. No evidence for perforation, abscess, or other complication identified on today's study. Overall, degree of inflammatory changes has worsened relative to recent exam. Anastomotic suture noted at the rectum. Moderate retained stool elsewhere within the colon, which may reflect underlying constipation. Vascular/Lymphatic: Mild atheromatous plaque within the infrarenal aorta. No aneurysm. Normal intravascular enhancement seen throughout the intra-abdominal aorta and its branch vessels. No adenopathy. Reproductive: Uterus is absent.  Ovaries not discretely identified. Other: Moderate volume free fluid within the pelvis, likely reactive. No free intraperitoneal air. Musculoskeletal: No acute osseous abnormality. No worrisome lytic or blastic osseous lesions. IMPRESSION: 1. Findings consistent with acute diverticulitis, progressed/worsened relative to recent CT from 10/14/2016. No discrete abscess identified on today's examination. No evidence for perforation or other complication. 2. Moderate diffuse stool throughout the remainder of the colon, suggesting underlying constipation. 3. Bilateral nonobstructive nephrolithiasis. Electronically Signed   By: Jeannine Boga M.D.   On: 10/19/2016 17:04   Ct Abdomen Pelvis W Contrast  Result Date: 10/14/2016 CLINICAL DATA:  BILATERAL lower abdominal pain and BILATERAL flank pain for 3 weeks, nausea, history diverticulitis, kidney stones, ovarian cyst, smoker, GERD EXAM: CT ABDOMEN AND PELVIS WITH CONTRAST TECHNIQUE: Multidetector CT imaging of the abdomen and pelvis was performed using the standard  protocol following bolus administration of intravenous contrast. Sagittal and coronal MPR images reconstructed from axial data set. CONTRAST:  190m ISOVUE-300 IOPAMIDOL (ISOVUE-300) INJECTION 61% IV. Dilute oral contrast. COMPARISON:  03/11/2014 FINDINGS: Lower chest: Minimal dependent atelectasis in posterior lungs Hepatobiliary: Tiny hepatic cysts largest 12 mm greatest size. Minimal focal fatty infiltration of liver adjacent to falciform fissure. Gallbladder and liver otherwise unremarkable. Pancreas: Normal appearance Spleen: Normal appearance Adrenals/Urinary Tract: Adrenal glands normal appearance. Tiny BILATERAL nonobstructing renal calculi. Kidneys, ureters, and bladder otherwise unremarkable. Stomach/Bowel: Normal appendix, retrocecal. Mild wall thickening of the ascending colon extending to hepatic flexure question artifact from underdistention versus colitis. In addition, numerous diverticula of the descending and sigmoid colon with wall thickening and pericolic inflammatory changes at the descending sigmoid junction consistent with acute diverticulitis. Tiny fluid collection at site of inflammation questionably a tiny diverticular abscess, 10 x 7 x 6 mm in size. Stomach and small bowel loops unremarkable. Vascular/Lymphatic: Minimal atherosclerotic calcification aorta and iliac arteries. Aorta normal caliber. No adenopathy. Reproductive: Uterus surgically absent. Nonvisualization of ovaries. Other: No free air or free fluid.  No hernia. Musculoskeletal: No acute osseous findings. IMPRESSION: Diverticulosis of the descending and sigmoid colon with focal acute diverticulitis at the descending sigmoid junction. Questionable tiny diverticular abscess 10 x 7 x 6 mm. Wall thickening of the ascending colon extending to the hepatic flexure, favor colitis though cannot completely exclude artifact from incomplete distention. Tiny BILATERAL nonobstructing renal calculi. Aortic Atherosclerosis (ICD10-I70.0).  Electronically Signed   By: MLavonia DanaM.D.   On: 10/14/2016 11:35  Assessment:   Kadasia Kassing is a 51 y.o. female with recurrent diverticulitis. Treated recently with cipro and flagyl and had some improvement but sxs recurred after being off for about 5 days. Had initial diverticulitis about 3 years ago.  Reports colon done around 2015.  She seems to be responding with improvement in WBC and no fevers but her pain persists and is currently rated 5/10 despite having received dilaudid. She is in no distress on exam, BP normal and not tachycardic.  All this would point to improvement.  I think she should continue to improve but would continue IV abx until she is able to advance her diet.  Once stable for dc can send on oral augmentin and would suggest a 14 day total abx course.  She may benefit from elective surgery in future to prevent recurrence.  I will not be available again until July 30 but ID at [facility] is available if abx related questions.  Otherwise would defer management to surgery.  Thank you very much for allowing me to participate in the care of this patient. Please call with questions.   Cheral Marker. Ola Spurr, MD

## 2016-10-22 NOTE — Progress Notes (Signed)
[place] at [facility]                                                                                                                                                                                  Patient Demographics   Morgan Cisneros, is a 51 y.o. female, DOB - [birth year], ZOX:[id number]  Admit date - 10/19/2016   Admitting Physician Baxter Hire, MD  Outpatient Primary MD for the patient is Valerie Roys, DO   LOS - 3  Subjective: Patient states that her pain is was doing well yesterday but then got worse this morning.   Review of Systems:   CONSTITUTIONAL: No documented fever. No fatigue, weakness. No weight gain, no weight loss.  EYES: No blurry or double vision.  ENT: No tinnitus. No postnasal drip. No redness of the oropharynx.  RESPIRATORY: No cough, no wheeze, no hemoptysis. No dyspnea.  CARDIOVASCULAR: No chest pain. No orthopnea. No palpitations. No syncope.  GASTROINTESTINAL: No nausea, no vomiting or diarrhea. + abdominal pain. No melena or hematochezia.  GENITOURINARY: No dysuria or hematuria.  ENDOCRINE: No polyuria or nocturia. No heat or cold intolerance.  HEMATOLOGY: No anemia. No bruising. No bleeding.  INTEGUMENTARY: No rashes. No lesions.  MUSCULOSKELETAL: No arthritis. No swelling. No gout.  NEUROLOGIC: No numbness, tingling, or ataxia. No seizure-type activity.  PSYCHIATRIC: No anxiety. No insomnia. No ADD.    Vitals:   Vitals:   10/21/16 0442 10/21/16 1251 10/21/16 1952 10/22/16 0502  BP: (!) 109/58 126/83 116/80 (!) 130/91  Pulse: 85 79 76 76  Resp: 16 14 16 16   Temp: 98.5 F (36.9 C) 98.6 F (37 C) 98.4 F (36.9 C) 98.5 F (36.9 C)  TempSrc: Oral Oral Oral Oral  SpO2: 95% 100% 100% 98%  Weight:      Height:        Wt Readings from Last 3 Encounters:  10/19/16 140 lb (63.5 kg)  10/14/16 144 lb 4 oz (65.4 kg)  09/24/16 147 lb (66.7 kg)     Intake/Output Summary (Last 24 hours) at 10/22/16 1411 Last  data filed at 10/22/16 1008  Gross per 24 hour  Intake             1815 ml  Output             1950 ml  Net             -135 ml    Physical Exam:   GENERAL: Pleasant-appearing in no apparent distress.  HEAD, EYES, EARS, NOSE AND THROAT: Atraumatic, normocephalic. Extraocular muscles are intact. Pupils equal and reactive to light. Sclerae anicteric. No conjunctival injection. No oro-pharyngeal erythema.  NECK: Supple. There is no jugular venous distention. No bruits, no lymphadenopathy, no thyromegaly.  HEART: Regular rate and rhythm,. No murmurs, no rubs, no clicks.  LUNGS: Clear to auscultation bilaterally. No rales or rhonchi. No wheezes.  ABDOMEN: Soft, flat, Positive llq tenderness. Has good bowel sounds. No hepatosplenomegaly appreciated.  EXTREMITIES: No evidence of any cyanosis, clubbing, or peripheral edema.  +2 pedal and radial pulses bilaterally.  NEUROLOGIC: The patient is alert, awake, and oriented x3 with no focal motor or sensory deficits appreciated bilaterally.  SKIN: Moist and warm with no rashes appreciated.  Psych: Not anxious, depressed LN: No inguinal LN enlargement    Antibiotics   Anti-infectives    Start     Dose/Rate Route Frequency Ordered Stop   10/22/16 1400  meropenem (MERREM) 1 g in sodium chloride 0.9 % 100 mL IVPB     1 g 200 mL/hr over 30 Minutes Intravenous Every 8 hours 10/22/16 1340     10/20/16 1400  Ampicillin-Sulbactam (UNASYN) 3 g in sodium chloride 0.9 % 100 mL IVPB  Status:  Discontinued     3 g 200 mL/hr over 30 Minutes Intravenous Every 6 hours 10/20/16 1323 10/22/16 1335   10/19/16 2000  ertapenem (INVANZ) 1 g in sodium chloride 0.9 % 50 mL IVPB  Status:  Discontinued     1 g 100 mL/hr over 30 Minutes Intravenous Every 24 hours 10/19/16 1907 10/20/16 1323   10/19/16 1458  piperacillin-tazobactam (ZOSYN) 3-0.375 GM/50ML IVPB    Comments:  Gwynn Burly   : cabinet override      10/19/16 1458 10/20/16 0314   10/19/16 1445   piperacillin-tazobactam (ZOSYN) IVPB 3.375 g     3.375 g 100 mL/hr over 30 Minutes Intravenous  Once 10/19/16 1432 10/19/16 1717      Medications   Scheduled Meds: . docusate sodium  100 mg Oral BID  . DULoxetine  40 mg Oral Daily  . enoxaparin (LOVENOX) injection  40 mg Subcutaneous Q24H  . feeding supplement  1 Container Oral TID BM  . LORazepam  1 mg Oral QHS   Continuous Infusions: . sodium chloride 100 mL/hr at 10/22/16 0520  . meropenem (MERREM) IV     PRN Meds:.acetaminophen **OR** acetaminophen, HYDROmorphone (DILAUDID) injection, iopamidol, ondansetron (ZOFRAN) IV   Data Review:   Micro Results Recent Results (from the past 240 hour(s))  Microscopic Examination     Status: Abnormal   Collection Time: 10/14/16  8:49 AM  Result Value Ref Range Status   RBC, UA 3-10 (A) 0 - 2 /hpf Final   Epithelial Cells (non renal) 0-10 0 - 10 /hpf Final  Blood Culture (routine x 2)     Status: None (Preliminary result)   Collection Time: 10/19/16  2:48 PM  Result Value Ref Range Status   Specimen Description BLOOD BLOOD LEFT HAND  Final   Special Requests   Final    BOTTLES DRAWN AEROBIC AND ANAEROBIC Blood Culture adequate volume   Culture NO GROWTH 3 DAYS  Final   Report Status PENDING  Incomplete  Blood Culture (routine x 2)     Status: None (Preliminary result)   Collection Time: 10/19/16  2:48 PM  Result Value Ref Range Status   Specimen Description BLOOD RIGHT ANTECUBITAL  Final   Special Requests   Final    BOTTLES DRAWN AEROBIC AND ANAEROBIC Blood Culture adequate volume   Culture NO GROWTH 3 DAYS  Final   Report Status PENDING  Incomplete    Radiology  Reports Ct Abdomen Pelvis W Contrast  Result Date: 10/22/2016 CLINICAL DATA:  Recent diagnosis of diverticulitis. Patient with persistent and recurrent abdominal pain and nausea. EXAM: CT ABDOMEN AND PELVIS WITH CONTRAST TECHNIQUE: Multidetector CT imaging of the abdomen and pelvis was performed using the standard  protocol following bolus administration of intravenous contrast. CONTRAST:  165mL ISOVUE-300 IOPAMIDOL (ISOVUE-300) INJECTION 61% COMPARISON:  10/19/2016; 10/14/2016; 03/11/2014 FINDINGS: Lower chest: Limited visualization of lower thorax demonstrates minimal dependent subpleural ground-glass atelectasis. No focal airspace opacities. No pleural effusion. Normal heart size. No pericardial effusion. Hepatobiliary: Normal hepatic contour. Re- demonstrated approximately 1.2 cm hypoattenuating (10 Hounsfield unit) cyst within in the dome of the right lobe of the liver. Additional hypoattenuating renal lesions are too small to adequately characterize of favored to represent additional renal cysts. Normal appearance of the gallbladder given degree distention. No radiopaque gallstones. No intra extrahepatic bili duct dilatation. There is a small amount of fluid tracking about the caudal aspect of the liver (image 34, series 2). Pancreas: Normal appearance of the pancreas Spleen: Normal appearance of the spleen, however a small amount of fluid is seen adjacent to the splenic contour within left upper abdominal quadrant (image 20, series 2). Adrenals/Urinary Tract: There is symmetric enhancement and excretion of the bilateral kidneys. Multiple punctate (sub 2 mm) nonobstructing renal stones are seen bilaterally with cluster of non obstructing stones within the superior pole the right kidney is seen on coronal image 59, series 5 and solitary punctate (approximately 1 mm) nonobstructing left renal stent stone seen on coronal image 56, series 5). No renal stones are seen along the expected course of either ureter or the urinary bladder. No urine obstruction or perinephric stranding. Normal appearance of the bilateral adrenal glands. There is mass effect of the inflammatory process within the sigmoid colon on the left-sided the dome of the urinary bladder. The urinary bladder appears otherwise normal. Stomach/Bowel: Ingested  enteric contrast extensive the level of the rectum. Extensive colonic diverticulosis. Re- demonstrated extensive wall thickening involving the sigmoid colon within the left lower abdomen/ pelvis (representative axial images 63 through 69, series 2). Adjacent mesenteric stranding however there is no evidence of perforation or definable/ drainable fluid collection. Overall, the amount of inflammation wall thickening has progressed compared to the 7/17 examination. The cecum is noted to be located within the right mid hemiabdomen. The bowel is otherwise normal in course and caliber without evidence of enteric obstruction. Normal appearance of the terminal ileum and retrocecal appendix. Vascular/Lymphatic: Minimal amount of mixed calcified and noncalcified atherosclerotic plaque with a normal caliber abdominal aorta. The major branch vessels of the abdominal aorta appear patent on this non CTA examination. Scattered retroperitoneal lymph nodes are numerous though individually not enlarged by size criteria with index left common iliac chain lymph node measuring 0.9 cm in diameter (image 47, series 2), likely reactive in etiology. No bulky retroperitoneal, mesenteric, pelvic or inguinal lymphadenopathy. Reproductive: Post hysterectomy. No discrete adnexal lesion. There is a small amount of fluid within the pelvic cul-de-sac. Other: Tiny mesenteric fat containing periumbilical hernia. Musculoskeletal: No acute or aggressive osseous abnormalities. IMPRESSION: 1. Findings suggestive of acute uncomplicated diverticulitis involving the sigmoid colon within the left lower abdomen/pelvis - while there is no evidence of perforation or definable/drainable fluid collection, the amount of adjacent mesenteric stranding and inflammation has continued to progress compared to the 07/17 examination. 2. No evidence of enteric obstruction. 3. Bilateral nonobstructing nephrolithiasis. 4.  Aortic Atherosclerosis (ICD10-I70.0). Electronically  Signed   By: Jenny Reichmann  Watts M.D.   On: 10/22/2016 12:26   Ct Abdomen Pelvis W Contrast  Result Date: 10/19/2016 CLINICAL DATA:  Initial evaluation for acute diverticulitis, feeling outpatient management, possible recent abscess. EXAM: CT ABDOMEN AND PELVIS WITH CONTRAST TECHNIQUE: Multidetector CT imaging of the abdomen and pelvis was performed using the standard protocol following bolus administration of intravenous contrast. CONTRAST:  152mL ISOVUE-300 IOPAMIDOL (ISOVUE-300) INJECTION 61% COMPARISON:  Prior CT from 10/14/2016. FINDINGS: Lower chest: Hazy subsegmental atelectatic changes present dependently within the visualized lung bases. Visualized lungs are otherwise clear. Hepatobiliary: Scattered hypodensities noted throughout the liver, likely small cysts and/or possibly biliary hamartomas, grossly stable. Liver otherwise unremarkable. Gallbladder within normal limits. No biliary dilatation. Pancreas: Pancreas within normal limits. Spleen: Spleen within normal limits. Adrenals/Urinary Tract: The adrenal glands are normal. Kidneys equal in size with symmetric enhancement. Punctate 3 mm bilateral nonobstructive nephrolithiasis, greater on the right. No hydronephrosis. subcentimeter hypodensity at the lower pole the left kidney too small the characterize, but statistically likely reflects a small cyst. No focal enhancing renal mass. No hydroureter. Bladder within normal limits. Stomach/Bowel: Small hiatal hernia noted. Stomach otherwise unremarkable. No evidence for bowel obstruction. Appendix normal. Multiple diverticula again seen involving the descending and sigmoid colon. There is persistent inflammatory stranding about several diverticula at the sigmoid colon, consistent with acute diverticulitis. Previously question small diverticular abscess is not seen on today's study. This most likely reflected a small inflamed diverticulum upon retrospect. No evidence for perforation, abscess, or other complication  identified on today's study. Overall, degree of inflammatory changes has worsened relative to recent exam. Anastomotic suture noted at the rectum. Moderate retained stool elsewhere within the colon, which may reflect underlying constipation. Vascular/Lymphatic: Mild atheromatous plaque within the infrarenal aorta. No aneurysm. Normal intravascular enhancement seen throughout the intra-abdominal aorta and its branch vessels. No adenopathy. Reproductive: Uterus is absent.  Ovaries not discretely identified. Other: Moderate volume free fluid within the pelvis, likely reactive. No free intraperitoneal air. Musculoskeletal: No acute osseous abnormality. No worrisome lytic or blastic osseous lesions. IMPRESSION: 1. Findings consistent with acute diverticulitis, progressed/worsened relative to recent CT from 10/14/2016. No discrete abscess identified on today's examination. No evidence for perforation or other complication. 2. Moderate diffuse stool throughout the remainder of the colon, suggesting underlying constipation. 3. Bilateral nonobstructive nephrolithiasis. Electronically Signed   By: Jeannine Boga M.D.   On: 10/19/2016 17:04   Ct Abdomen Pelvis W Contrast  Result Date: 10/14/2016 CLINICAL DATA:  BILATERAL lower abdominal pain and BILATERAL flank pain for 3 weeks, nausea, history diverticulitis, kidney stones, ovarian cyst, smoker, GERD EXAM: CT ABDOMEN AND PELVIS WITH CONTRAST TECHNIQUE: Multidetector CT imaging of the abdomen and pelvis was performed using the standard protocol following bolus administration of intravenous contrast. Sagittal and coronal MPR images reconstructed from axial data set. CONTRAST:  192mL ISOVUE-300 IOPAMIDOL (ISOVUE-300) INJECTION 61% IV. Dilute oral contrast. COMPARISON:  03/11/2014 FINDINGS: Lower chest: Minimal dependent atelectasis in posterior lungs Hepatobiliary: Tiny hepatic cysts largest 12 mm greatest size. Minimal focal fatty infiltration of liver adjacent to  falciform fissure. Gallbladder and liver otherwise unremarkable. Pancreas: Normal appearance Spleen: Normal appearance Adrenals/Urinary Tract: Adrenal glands normal appearance. Tiny BILATERAL nonobstructing renal calculi. Kidneys, ureters, and bladder otherwise unremarkable. Stomach/Bowel: Normal appendix, retrocecal. Mild wall thickening of the ascending colon extending to hepatic flexure question artifact from underdistention versus colitis. In addition, numerous diverticula of the descending and sigmoid colon with wall thickening and pericolic inflammatory changes at the descending sigmoid junction consistent with acute diverticulitis. Tiny  fluid collection at site of inflammation questionably a tiny diverticular abscess, 10 x 7 x 6 mm in size. Stomach and small bowel loops unremarkable. Vascular/Lymphatic: Minimal atherosclerotic calcification aorta and iliac arteries. Aorta normal caliber. No adenopathy. Reproductive: Uterus surgically absent. Nonvisualization of ovaries. Other: No free air or free fluid.  No hernia. Musculoskeletal: No acute osseous findings. IMPRESSION: Diverticulosis of the descending and sigmoid colon with focal acute diverticulitis at the descending sigmoid junction. Questionable tiny diverticular abscess 10 x 7 x 6 mm. Wall thickening of the ascending colon extending to the hepatic flexure, favor colitis though cannot completely exclude artifact from incomplete distention. Tiny BILATERAL nonobstructing renal calculi. Aortic Atherosclerosis (ICD10-I70.0). Electronically Signed   By: Lavonia Dana M.D.   On: 10/14/2016 11:35     CBC  Recent Labs Lab 10/19/16 1341 10/20/16 0437 10/21/16 0501 10/22/16 0548  WBC 12.6* 10.3 10.2 8.2  HGB 15.8 13.5 12.8 12.8  HCT 46.4 38.4 36.7 36.8  PLT 396 319 340 339  MCV 93.1 94.7 93.7 95.4  MCH 31.8 33.3 32.6 33.2  MCHC 34.1 35.2 34.8 34.8  RDW 13.8 13.6 13.6 13.3    Chemistries   Recent Labs Lab 10/19/16 1341 10/21/16 0501  10/22/16 0548  NA 141 141 140  K 4.0 3.4* 3.4*  CL 106 108 106  CO2 25 26 27   GLUCOSE 90 101* 92  BUN 9 5* <5*  CREATININE 0.66 0.50 0.61  CALCIUM 9.7 8.4* 8.4*  AST 29  --   --   ALT 42  --   --   ALKPHOS 133*  --   --   BILITOT 0.6  --   --    ------------------------------------------------------------------------------------------------------------------ estimated creatinine clearance is 76.5 mL/min (by C-G formula based on SCr of 0.61 mg/dL). ------------------------------------------------------------------------------------------------------------------ No results for input(s): HGBA1C in the last 72 hours. ------------------------------------------------------------------------------------------------------------------ No results for input(s): CHOL, HDL, LDLCALC, TRIG, CHOLHDL, LDLDIRECT in the last 72 hours. ------------------------------------------------------------------------------------------------------------------ No results for input(s): TSH, T4TOTAL, T3FREE, THYROIDAB in the last 72 hours.  Invalid input(s): FREET3 ------------------------------------------------------------------------------------------------------------------ No results for input(s): VITAMINB12, FOLATE, FERRITIN, TIBC, IRON, RETICCTPCT in the last 72 hours.  Coagulation profile No results for input(s): INR, PROTIME in the last 168 hours.  No results for input(s): DDIMER in the last 72 hours.  Cardiac Enzymes No results for input(s): CKMB, TROPONINI, MYOGLOBIN in the last 168 hours.  Invalid input(s): CK ------------------------------------------------------------------------------------------------------------------ Invalid input(s): [place]   1. acute Diverticulitis. She has failed outpatient therapy Repeat CT shows progression of disease I will change her antibiotics to IV meropenem and ask ID to see 2. Severe constipation improved with Colace 3. Nicotine abuse  states only smokes a few cigarettes I strongly recommended she stop smoking       Code Status Orders        Start     Ordered   10/19/16 1908  Full code  Continuous     10/19/16 1907    Code Status History    Date Active Date Inactive Code Status Order ID Comments User Context   This patient has a current code status but no historical code status.           Consultsnone  DVT Prophylaxis  Lovenox    Lab Results  Component Value Date   PLT 339 10/22/2016     Time Spent in minutes   41min  Greater than 50% of time spent in care coordination and counseling patient regarding the condition and  plan of care.   Dustin Flock M.D on 10/22/2016 at 2:11 PM  Between 7am to 6pm - Pager - [phone]  After 6pm go to [email or web address] - password [email or web address] [place] [facility]   Office  [phone]

## 2016-10-23 LAB — C DIFFICILE QUICK SCREEN W PCR REFLEX
C DIFFICILE (CDIFF) TOXIN: NEGATIVE
C DIFFICLE (CDIFF) ANTIGEN: POSITIVE — AB

## 2016-10-23 LAB — GASTROINTESTINAL PANEL BY PCR, STOOL (REPLACES STOOL CULTURE)

## 2016-10-23 LAB — BASIC METABOLIC PANEL
ANION GAP: 10 (ref 5–15)
BUN: <5 mg/dL — ABNORMAL LOW (ref 6–20)
CALCIUM: 8.6 mg/dL — AB (ref 8.9–10.3)
CO2: 26 mmol/L (ref 22–32)
Chloride: 105 mmol/L (ref 101–111)
Creatinine, Ser: 0.59 mg/dL (ref 0.44–1.00)
Glucose, Bld: 83 mg/dL (ref 65–99)
Potassium: 3.4 mmol/L — ABNORMAL LOW (ref 3.5–5.1)
Sodium: 141 mmol/L (ref 135–145)

## 2016-10-23 LAB — CLOSTRIDIUM DIFFICILE BY PCR: CDIFFPCR: POSITIVE — AB

## 2016-10-23 LAB — CBC
HCT: 36.4 % (ref 35.0–47.0)
Hemoglobin: 12.5 g/dL (ref 12.0–16.0)
MCH: 32.7 pg (ref 26.0–34.0)
MCHC: 34.5 g/dL (ref 32.0–36.0)
MCV: 94.8 fL (ref 80.0–100.0)
PLATELETS: 367 10*3/uL (ref 150–440)
RBC: 3.84 MIL/uL (ref 3.80–5.20)
RDW: 13.1 % (ref 11.5–14.5)
WBC: 7.6 10*3/uL (ref 3.6–11.0)

## 2016-10-23 LAB — PROCALCITONIN

## 2016-10-23 MED ORDER — VANCOMYCIN 50 MG/ML ORAL SOLUTION
250.0000 mg | Freq: Four times a day (QID) | ORAL | Status: DC
  Administered 2016-10-23 – 2016-10-26 (×11): 250 mg via ORAL
  Filled 2016-10-23 (×14): qty 5

## 2016-10-23 MED ORDER — POTASSIUM CHLORIDE IN NACL 20-0.9 MEQ/L-% IV SOLN
INTRAVENOUS | Status: DC
  Administered 2016-10-23: 1000 mL via INTRAVENOUS
  Administered 2016-10-24 – 2016-10-25 (×3): via INTRAVENOUS
  Filled 2016-10-23 (×8): qty 1000

## 2016-10-23 NOTE — Progress Notes (Signed)
[place] at [facility]                                                                                                                                                                                  Patient Demographics   Morgan Cisneros, is a 51 y.o. female, DOB - 1965/07/29, YKZ:[id number]  Admit date - 10/19/2016   Admitting Physician Baxter Hire, MD  Outpatient Primary MD for the patient is Valerie Roys, DO   LOS - 4  Subjective: Admits of diarrheal stool and blood in the stool.   Review of Systems:   CONSTITUTIONAL: No documented fever. No fatigue, weakness. No weight gain, no weight loss.  EYES: No blurry or double vision.  ENT: No tinnitus. No postnasal drip. No redness of the oropharynx.  RESPIRATORY: No cough, no wheeze, no hemoptysis. No dyspnea.  CARDIOVASCULAR: No chest pain. No orthopnea. No palpitations. No syncope.  GASTROINTESTINAL: No nausea, no vomiting . Admits of diarrhea, rectal bleeding, lower abdominal pain, tenesmus. No melena  GENITOURINARY: No dysuria or hematuria.  ENDOCRINE: No polyuria or nocturia. No heat or cold intolerance.  HEMATOLOGY: No anemia. No bruising. No bleeding.  INTEGUMENTARY: No rashes. No lesions.  MUSCULOSKELETAL: No arthritis. No swelling. No gout.  NEUROLOGIC: No numbness, tingling, or ataxia. No seizure-type activity.  PSYCHIATRIC: No anxiety. No insomnia. No ADD.    Vitals:   Vitals:   10/22/16 1222 10/22/16 2037 10/23/16 0555 10/23/16 1351  BP: 128/84 124/84 101/61 120/76  Pulse: 74 88 79 80  Resp:  20 16   Temp: 98.4 F (36.9 C) 97.7 F (36.5 C) 98.3 F (36.8 C) 98.6 F (37 C)  TempSrc: Oral Oral Oral Oral  SpO2: 98% 98% 94% 94%  Weight:      Height:        Wt Readings from Last 3 Encounters:  10/19/16 63.5 kg (140 lb)  10/14/16 65.4 kg (144 lb 4 oz)  09/24/16 66.7 kg (147 lb)     Intake/Output Summary (Last 24 hours) at 10/23/16 1448 Last data filed at 10/23/16 0548   Gross per 24 hour  Intake          2254.77 ml  Output                0 ml  Net          2254.77 ml    Physical Exam:   GENERAL: Pleasant-appearing in no apparent distress.  HEAD, EYES, EARS, NOSE AND THROAT: Atraumatic, normocephalic. Extraocular muscles are intact. Pupils equal and reactive to light. Sclerae anicteric. No conjunctival injection. No oro-pharyngeal erythema.  NECK: Supple. There is no jugular venous distention.  No bruits, no lymphadenopathy, no thyromegaly.  HEART: Regular rate and rhythm,. No murmurs, no rubs, no clicks.  LUNGS: Clear to auscultation bilaterally. No rales or rhonchi. No wheezes.  ABDOMEN: Soft, flat, Positive llq tenderness. Has good bowel sounds. No hepatosplenomegaly appreciated.  EXTREMITIES: No evidence of any cyanosis, clubbing, or peripheral edema.  +2 pedal and radial pulses bilaterally.  NEUROLOGIC: The patient is alert, awake, and oriented x3 with no focal motor or sensory deficits appreciated bilaterally.  SKIN: Moist and warm with no rashes appreciated.  Psych: Not anxious, depressed LN: No inguinal LN enlargement    Antibiotics   Anti-infectives    Start     Dose/Rate Route Frequency Ordered Stop   10/23/16 1800  vancomycin (VANCOCIN) 50 mg/mL oral solution 250 mg     250 mg Oral Every 6 hours 10/23/16 1446     10/22/16 1400  meropenem (MERREM) 1 g in sodium chloride 0.9 % 100 mL IVPB  Status:  Discontinued     1 g 200 mL/hr over 30 Minutes Intravenous Every 8 hours 10/22/16 1340 10/23/16 1446   10/20/16 1400  Ampicillin-Sulbactam (UNASYN) 3 g in sodium chloride 0.9 % 100 mL IVPB  Status:  Discontinued     3 g 200 mL/hr over 30 Minutes Intravenous Every 6 hours 10/20/16 1323 10/22/16 1335   10/19/16 2000  ertapenem (INVANZ) 1 g in sodium chloride 0.9 % 50 mL IVPB  Status:  Discontinued     1 g 100 mL/hr over 30 Minutes Intravenous Every 24 hours 10/19/16 1907 10/20/16 1323   10/19/16 1458  piperacillin-tazobactam (ZOSYN) 3-0.375  GM/50ML IVPB    Comments:  Gwynn Burly   : cabinet override      10/19/16 1458 10/20/16 0314   10/19/16 1445  piperacillin-tazobactam (ZOSYN) IVPB 3.375 g     3.375 g 100 mL/hr over 30 Minutes Intravenous  Once 10/19/16 1432 10/19/16 1717      Medications   Scheduled Meds: . DULoxetine  40 mg Oral Daily  . enoxaparin (LOVENOX) injection  40 mg Subcutaneous Q24H  . feeding supplement  1 Container Oral TID BM  . LORazepam  1 mg Oral QHS  . vancomycin  250 mg Oral Q6H   Continuous Infusions: . 0.9 % NaCl with KCl 20 mEq / L     PRN Meds:.acetaminophen **OR** acetaminophen, HYDROmorphone (DILAUDID) injection, iopamidol, ondansetron (ZOFRAN) IV   Data Review:   Micro Results Recent Results (from the past 240 hour(s))  Microscopic Examination     Status: Abnormal   Collection Time: 10/14/16  8:49 AM  Result Value Ref Range Status   RBC, UA 3-10 (A) 0 - 2 /hpf Final   Epithelial Cells (non renal) 0-10 0 - 10 /hpf Final  Blood Culture (routine x 2)     Status: None (Preliminary result)   Collection Time: 10/19/16  2:48 PM  Result Value Ref Range Status   Specimen Description BLOOD BLOOD LEFT HAND  Final   Special Requests   Final    BOTTLES DRAWN AEROBIC AND ANAEROBIC Blood Culture adequate volume   Culture NO GROWTH 4 DAYS  Final   Report Status PENDING  Incomplete  Blood Culture (routine x 2)     Status: None (Preliminary result)   Collection Time: 10/19/16  2:48 PM  Result Value Ref Range Status   Specimen Description BLOOD RIGHT ANTECUBITAL  Final   Special Requests   Final    BOTTLES DRAWN AEROBIC AND ANAEROBIC Blood Culture adequate volume  Culture NO GROWTH 4 DAYS  Final   Report Status PENDING  Incomplete  Gastrointestinal Panel by PCR , Stool     Status: None   Collection Time: 10/23/16 11:00 AM  Result Value Ref Range Status   Campylobacter species NOT DETECTED NOT DETECTED Final   Plesimonas shigelloides NOT DETECTED NOT DETECTED Final   Salmonella species NOT  DETECTED NOT DETECTED Final   Yersinia enterocolitica NOT DETECTED NOT DETECTED Final   Vibrio species NOT DETECTED NOT DETECTED Final   Vibrio cholerae NOT DETECTED NOT DETECTED Final   Enteroaggregative E coli (EAEC) NOT DETECTED NOT DETECTED Final   Enteropathogenic E coli (EPEC) NOT DETECTED NOT DETECTED Final   Enterotoxigenic E coli (ETEC) NOT DETECTED NOT DETECTED Final   Shiga like toxin producing E coli (STEC) NOT DETECTED NOT DETECTED Final   Shigella/Enteroinvasive E coli (EIEC) NOT DETECTED NOT DETECTED Final   Cryptosporidium NOT DETECTED NOT DETECTED Final   Cyclospora cayetanensis NOT DETECTED NOT DETECTED Final   Entamoeba histolytica NOT DETECTED NOT DETECTED Final   Giardia lamblia NOT DETECTED NOT DETECTED Final   Adenovirus F40/41 NOT DETECTED NOT DETECTED Final   Astrovirus NOT DETECTED NOT DETECTED Final   Norovirus GI/GII NOT DETECTED NOT DETECTED Final   Rotavirus A NOT DETECTED NOT DETECTED Final   Sapovirus (I, II, IV, and V) NOT DETECTED NOT DETECTED Final  C difficile quick scan w PCR reflex     Status: Abnormal   Collection Time: 10/23/16 11:00 AM  Result Value Ref Range Status   C Diff antigen POSITIVE (A) NEGATIVE Final   C Diff toxin NEGATIVE NEGATIVE Final   C Diff interpretation Results are indeterminate. See PCR results.  Final  Clostridium Difficile by PCR     Status: Abnormal   Collection Time: 10/23/16 11:00 AM  Result Value Ref Range Status   Toxigenic C Difficile by pcr POSITIVE (A) NEGATIVE Final    Comment: Positive for toxigenic C. difficile with little to no toxin production. Only treat if clinical presentation suggests symptomatic illness.    Radiology Reports Ct Abdomen Pelvis W Contrast  Result Date: 10/22/2016 CLINICAL DATA:  Recent diagnosis of diverticulitis. Patient with persistent and recurrent abdominal pain and nausea. EXAM: CT ABDOMEN AND PELVIS WITH CONTRAST TECHNIQUE: Multidetector CT imaging of the abdomen and pelvis was  performed using the standard protocol following bolus administration of intravenous contrast. CONTRAST:  139mL ISOVUE-300 IOPAMIDOL (ISOVUE-300) INJECTION 61% COMPARISON:  10/19/2016; 10/14/2016; 03/11/2014 FINDINGS: Lower chest: Limited visualization of lower thorax demonstrates minimal dependent subpleural ground-glass atelectasis. No focal airspace opacities. No pleural effusion. Normal heart size. No pericardial effusion. Hepatobiliary: Normal hepatic contour. Re- demonstrated approximately 1.2 cm hypoattenuating (10 Hounsfield unit) cyst within in the dome of the right lobe of the liver. Additional hypoattenuating renal lesions are too small to adequately characterize of favored to represent additional renal cysts. Normal appearance of the gallbladder given degree distention. No radiopaque gallstones. No intra extrahepatic bili duct dilatation. There is a small amount of fluid tracking about the caudal aspect of the liver (image 34, series 2). Pancreas: Normal appearance of the pancreas Spleen: Normal appearance of the spleen, however a small amount of fluid is seen adjacent to the splenic contour within left upper abdominal quadrant (image 20, series 2). Adrenals/Urinary Tract: There is symmetric enhancement and excretion of the bilateral kidneys. Multiple punctate (sub 2 mm) nonobstructing renal stones are seen bilaterally with cluster of non obstructing stones within the superior pole the right kidney is seen  on coronal image 59, series 5 and solitary punctate (approximately 1 mm) nonobstructing left renal stent stone seen on coronal image 56, series 5). No renal stones are seen along the expected course of either ureter or the urinary bladder. No urine obstruction or perinephric stranding. Normal appearance of the bilateral adrenal glands. There is mass effect of the inflammatory process within the sigmoid colon on the left-sided the dome of the urinary bladder. The urinary bladder appears otherwise normal.  Stomach/Bowel: Ingested enteric contrast extensive the level of the rectum. Extensive colonic diverticulosis. Re- demonstrated extensive wall thickening involving the sigmoid colon within the left lower abdomen/ pelvis (representative axial images 63 through 69, series 2). Adjacent mesenteric stranding however there is no evidence of perforation or definable/ drainable fluid collection. Overall, the amount of inflammation wall thickening has progressed compared to the 7/17 examination. The cecum is noted to be located within the right mid hemiabdomen. The bowel is otherwise normal in course and caliber without evidence of enteric obstruction. Normal appearance of the terminal ileum and retrocecal appendix. Vascular/Lymphatic: Minimal amount of mixed calcified and noncalcified atherosclerotic plaque with a normal caliber abdominal aorta. The major branch vessels of the abdominal aorta appear patent on this non CTA examination. Scattered retroperitoneal lymph nodes are numerous though individually not enlarged by size criteria with index left common iliac chain lymph node measuring 0.9 cm in diameter (image 47, series 2), likely reactive in etiology. No bulky retroperitoneal, mesenteric, pelvic or inguinal lymphadenopathy. Reproductive: Post hysterectomy. No discrete adnexal lesion. There is a small amount of fluid within the pelvic cul-de-sac. Other: Tiny mesenteric fat containing periumbilical hernia. Musculoskeletal: No acute or aggressive osseous abnormalities. IMPRESSION: 1. Findings suggestive of acute uncomplicated diverticulitis involving the sigmoid colon within the left lower abdomen/pelvis - while there is no evidence of perforation or definable/drainable fluid collection, the amount of adjacent mesenteric stranding and inflammation has continued to progress compared to the 07/17 examination. 2. No evidence of enteric obstruction. 3. Bilateral nonobstructing nephrolithiasis. 4.  Aortic Atherosclerosis  (ICD10-I70.0). Electronically Signed   By: Sandi Mariscal M.D.   On: 10/22/2016 12:26   Ct Abdomen Pelvis W Contrast  Result Date: 10/19/2016 CLINICAL DATA:  Initial evaluation for acute diverticulitis, feeling outpatient management, possible recent abscess. EXAM: CT ABDOMEN AND PELVIS WITH CONTRAST TECHNIQUE: Multidetector CT imaging of the abdomen and pelvis was performed using the standard protocol following bolus administration of intravenous contrast. CONTRAST:  161mL ISOVUE-300 IOPAMIDOL (ISOVUE-300) INJECTION 61% COMPARISON:  Prior CT from 10/14/2016. FINDINGS: Lower chest: Hazy subsegmental atelectatic changes present dependently within the visualized lung bases. Visualized lungs are otherwise clear. Hepatobiliary: Scattered hypodensities noted throughout the liver, likely small cysts and/or possibly biliary hamartomas, grossly stable. Liver otherwise unremarkable. Gallbladder within normal limits. No biliary dilatation. Pancreas: Pancreas within normal limits. Spleen: Spleen within normal limits. Adrenals/Urinary Tract: The adrenal glands are normal. Kidneys equal in size with symmetric enhancement. Punctate 3 mm bilateral nonobstructive nephrolithiasis, greater on the right. No hydronephrosis. subcentimeter hypodensity at the lower pole the left kidney too small the characterize, but statistically likely reflects a small cyst. No focal enhancing renal mass. No hydroureter. Bladder within normal limits. Stomach/Bowel: Small hiatal hernia noted. Stomach otherwise unremarkable. No evidence for bowel obstruction. Appendix normal. Multiple diverticula again seen involving the descending and sigmoid colon. There is persistent inflammatory stranding about several diverticula at the sigmoid colon, consistent with acute diverticulitis. Previously question small diverticular abscess is not seen on today's study. This most likely reflected a small inflamed  diverticulum upon retrospect. No evidence for perforation,  abscess, or other complication identified on today's study. Overall, degree of inflammatory changes has worsened relative to recent exam. Anastomotic suture noted at the rectum. Moderate retained stool elsewhere within the colon, which may reflect underlying constipation. Vascular/Lymphatic: Mild atheromatous plaque within the infrarenal aorta. No aneurysm. Normal intravascular enhancement seen throughout the intra-abdominal aorta and its branch vessels. No adenopathy. Reproductive: Uterus is absent.  Ovaries not discretely identified. Other: Moderate volume free fluid within the pelvis, likely reactive. No free intraperitoneal air. Musculoskeletal: No acute osseous abnormality. No worrisome lytic or blastic osseous lesions. IMPRESSION: 1. Findings consistent with acute diverticulitis, progressed/worsened relative to recent CT from 10/14/2016. No discrete abscess identified on today's examination. No evidence for perforation or other complication. 2. Moderate diffuse stool throughout the remainder of the colon, suggesting underlying constipation. 3. Bilateral nonobstructive nephrolithiasis. Electronically Signed   By: Jeannine Boga M.D.   On: 10/19/2016 17:04   Ct Abdomen Pelvis W Contrast  Result Date: 10/14/2016 CLINICAL DATA:  BILATERAL lower abdominal pain and BILATERAL flank pain for 3 weeks, nausea, history diverticulitis, kidney stones, ovarian cyst, smoker, GERD EXAM: CT ABDOMEN AND PELVIS WITH CONTRAST TECHNIQUE: Multidetector CT imaging of the abdomen and pelvis was performed using the standard protocol following bolus administration of intravenous contrast. Sagittal and coronal MPR images reconstructed from axial data set. CONTRAST:  177mL ISOVUE-300 IOPAMIDOL (ISOVUE-300) INJECTION 61% IV. Dilute oral contrast. COMPARISON:  03/11/2014 FINDINGS: Lower chest: Minimal dependent atelectasis in posterior lungs Hepatobiliary: Tiny hepatic cysts largest 12 mm greatest size. Minimal focal fatty  infiltration of liver adjacent to falciform fissure. Gallbladder and liver otherwise unremarkable. Pancreas: Normal appearance Spleen: Normal appearance Adrenals/Urinary Tract: Adrenal glands normal appearance. Tiny BILATERAL nonobstructing renal calculi. Kidneys, ureters, and bladder otherwise unremarkable. Stomach/Bowel: Normal appendix, retrocecal. Mild wall thickening of the ascending colon extending to hepatic flexure question artifact from underdistention versus colitis. In addition, numerous diverticula of the descending and sigmoid colon with wall thickening and pericolic inflammatory changes at the descending sigmoid junction consistent with acute diverticulitis. Tiny fluid collection at site of inflammation questionably a tiny diverticular abscess, 10 x 7 x 6 mm in size. Stomach and small bowel loops unremarkable. Vascular/Lymphatic: Minimal atherosclerotic calcification aorta and iliac arteries. Aorta normal caliber. No adenopathy. Reproductive: Uterus surgically absent. Nonvisualization of ovaries. Other: No free air or free fluid.  No hernia. Musculoskeletal: No acute osseous findings. IMPRESSION: Diverticulosis of the descending and sigmoid colon with focal acute diverticulitis at the descending sigmoid junction. Questionable tiny diverticular abscess 10 x 7 x 6 mm. Wall thickening of the ascending colon extending to the hepatic flexure, favor colitis though cannot completely exclude artifact from incomplete distention. Tiny BILATERAL nonobstructing renal calculi. Aortic Atherosclerosis (ICD10-I70.0). Electronically Signed   By: Lavonia Dana M.D.   On: 10/14/2016 11:35     CBC  Recent Labs Lab 10/19/16 1341 10/20/16 0437 10/21/16 0501 10/22/16 0548 10/23/16 0502  WBC 12.6* 10.3 10.2 8.2 7.6  HGB 15.8 13.5 12.8 12.8 12.5  HCT 46.4 38.4 36.7 36.8 36.4  PLT 396 319 340 339 367  MCV 93.1 94.7 93.7 95.4 94.8  MCH 31.8 33.3 32.6 33.2 32.7  MCHC 34.1 35.2 34.8 34.8 34.5  RDW 13.8 13.6 13.6  13.3 13.1    Chemistries   Recent Labs Lab 10/19/16 1341 10/21/16 0501 10/22/16 0548 10/23/16 0502  NA 141 141 140 141  K 4.0 3.4* 3.4* 3.4*  CL 106 108 106 105  CO2 25 26  27 26  GLUCOSE 90 101* 92 83  BUN 9 5* <5* <5*  CREATININE 0.66 0.50 0.61 0.59  CALCIUM 9.7 8.4* 8.4* 8.6*  AST 29  --   --   --   ALT 42  --   --   --   ALKPHOS 133*  --   --   --   BILITOT 0.6  --   --   --    ------------------------------------------------------------------------------------------------------------------ estimated creatinine clearance is 76.5 mL/min (by C-G formula based on SCr of 0.59 mg/dL). ------------------------------------------------------------------------------------------------------------------ No results for input(s): HGBA1C in the last 72 hours. ------------------------------------------------------------------------------------------------------------------ No results for input(s): CHOL, HDL, LDLCALC, TRIG, CHOLHDL, LDLDIRECT in the last 72 hours. ------------------------------------------------------------------------------------------------------------------ No results for input(s): TSH, T4TOTAL, T3FREE, THYROIDAB in the last 72 hours.  Invalid input(s): FREET3 ------------------------------------------------------------------------------------------------------------------ No results for input(s): VITAMINB12, FOLATE, FERRITIN, TIBC, IRON, RETICCTPCT in the last 72 hours.  Coagulation profile No results for input(s): INR, PROTIME in the last 168 hours.  No results for input(s): DDIMER in the last 72 hours.  Cardiac Enzymes No results for input(s): CKMB, TROPONINI, MYOGLOBIN in the last 168 hours.  Invalid input(s): CK ------------------------------------------------------------------------------------------------------------------ Invalid input(s): POCBNP    Assessment & Plan   1. C. difficile enterocolitis. Discontinue meropenem , initiate patient on  vancomycin orally, follow clinically, change diet to clear liquids, advance diet as tolerated 2. Diarrhea due to C. difficile infection, stop Colace 3. Nicotine abuse , counseled. Yesterday 4. Hypokalemia, supplement intravenously 5. Leukocytosis, resolved 6. Hematochezia, follow hemoglobin level in the morning     Code Status Orders        Start     Ordered   10/19/16 1908  Full code  Continuous     10/19/16 1907    Code Status History    Date Active Date Inactive Code Status Order ID Comments User Context   This patient has a current code status but no historical code status.           Consultsnone  DVT Prophylaxis   Lab Results  Component Value Date   PLT 367 10/23/2016     Time Spent in minutes   35 min.. Discussed with patient's family, all questions were answered   Seabron Iannello M.D on 10/23/2016 at 2:48 PM  Between 7am to 6pm - Pager - [phone]  After 6pm go to [email or web address] - password [email or web address] [facility]   Office  [phone]

## 2016-10-23 NOTE — Progress Notes (Signed)
SURGICAL PROGRESS NOTE (cpt [phone])  Hospital Day(s): 4.   Post op day(s):  Morgan Cisneros   Interval History: Patient seen and examined, no acute events or new complaints overnight. Patient reports her abdominal pain has been slowly improving, denies N/V, fever/chills, CP, or SOB. She also reports relief with a large BM this morning that included a small amount of dark blood in the toilet with the BM.  Review of Systems:  Constitutional: denies fever, chills  HEENT: denies cough or congestion  Respiratory: denies any shortness of breath  Cardiovascular: denies chest pain or palpitations  Gastrointestinal: abdominal pain, N/V, and bowel function as per interval history Genitourinary: denies burning with urination or urinary frequency Musculoskeletal: denies pain, decreased motor or sensation Integumentary: denies any other rashes or skin discolorations Neurological: denies HA or vision/hearing changes   Vital signs in last 24 hours: [min-max] current  Temp:  [97.7 F (36.5 C)-98.4 F (36.9 C)] 98.3 F (36.8 C) (07/21 0555) Pulse Rate:  [74-88] 79 (07/21 0555) Resp:  [16-20] 16 (07/21 0555) BP: (101-128)/(61-84) 101/61 (07/21 0555) SpO2:  [94 %-98 %] 94 % (07/21 0555)     Height: 5\' 5"  (165.1 cm) Weight: 140 lb (63.5 kg) BMI (Calculated): 23.3   Intake/Output this shift:  No intake/output data recorded.   Intake/Output last 2 shifts:  @IOLAST2SHIFTS @   Physical Exam:  Constitutional: alert, cooperative and no distress  HENT: normocephalic without obvious abnormality  Eyes: PERRL, EOM's grossly intact and symmetric  Neuro: CN II - XII grossly intact and symmetric without deficit  Respiratory: breathing non-labored at rest  Cardiovascular: regular rate and sinus rhythm  Gastrointestinal: soft and non-distended with decreased moderate LLQ abdominal tenderness to palpation, no guarding or rebound tenderness Musculoskeletal: UE and LE FROM, no edema or wounds, motor and sensation grossly  intact, NT   Labs:  CBC Latest Ref Rng & Units 10/23/2016 10/22/2016 10/21/2016  WBC 3.6 - 11.0 K/uL 7.6 8.2 10.2  Hemoglobin 12.0 - 16.0 g/dL 12.5 12.8 12.8  Hematocrit 35.0 - 47.0 % 36.4 36.8 36.7  Platelets 150 - 440 K/uL 367 339 340   CMP Latest Ref Rng & Units 10/23/2016 10/22/2016 10/21/2016  Glucose 65 - 99 mg/dL 83 92 101(H)  BUN 6 - 20 mg/dL <5(L) <5(L) 5(L)  Creatinine 0.44 - 1.00 mg/dL 0.59 0.61 0.50  Sodium 135 - 145 mmol/L 141 140 141  Potassium 3.5 - 5.1 mmol/L 3.4(L) 3.4(L) 3.4(L)  Chloride 101 - 111 mmol/L 105 106 108  CO2 22 - 32 mmol/L 26 27 26   Calcium 8.9 - 10.3 mg/dL 8.6(L) 8.4(L) 8.4(L)  Total Protein 6.5 - 8.1 g/dL - - -  Total Bilirubin 0.3 - 1.2 mg/dL - - -  Alkaline Phos 38 - 126 U/L - - -  AST 15 - 41 U/L - - -  ALT 14 - 54 U/L - - -   Imaging studies:  CT Abdomen and Pelvis with IV Contrast (10/22/2016) - personally reviewed and discussed with patient Ingested enteric contrast extensive the level of the rectum. Extensive colonic diverticulosis. Re- demonstrated extensive wall thickening involving the sigmoid colon within the left lower abdomen/ pelvis (representative axial images 63 through 69, series 2). Adjacent mesenteric stranding however there is no evidence of perforation or definable/ drainable fluid collection. Overall, the amount of inflammation wall thickening has progressed compared to the 7/17 examination.  The cecum is noted to be located within the right mid hemiabdomen. The bowel is otherwise normal in course and caliber without evidence  of enteric obstruction. Normal appearance of the terminal ileum and retrocecal appendix.  Post hysterectomy. No discrete adnexal lesion. There is a small amount of fluid within the pelvic cul-de-sac.  Assessment/Plan: (ICD-10's: K60.32) 51 y.o. female with recurrent uncomplicated sigmoid colonic diveriticulitis (second episode since first episode 3 years ago) that initially improved with outpatient  Cipro/Flagyl, but recurred upon completion of antibiotics and did not improve with subsequent oral Cipro/Flagyl, so admitted with persistent abdominal pain despite inpatient Unasyn antibiotic therapy, complicated by pertinent comorbidities including chronic thoracic back pain, Right nephrolithiasis s/p lithotripsy x 4, ovarian cyst, ongoing chronic tobacco abuse, and insomnia.              - NPO, IV fluids             - pain control as needed             - IV antibiotics (currently on Meropenem)  - will reassess pain this afternoon and may consider clear liquids this evening if pain further improved             - medical management of comorbidities as per medical team             - ambulation encouraged, DVT prophylaxis  All of the above findings and recommendations were discussed with the patient, patient's husband, and patient's RN, and all of patient's and family's questions were answered to their expressed satisfaction.  Thank you for the opportunity to participate in this patient's care.  -- Marilynne Drivers Rosana Hoes, MD, [place]: [facility] - Partnering for exceptional care. Office: [phone]

## 2016-10-24 LAB — CULTURE, BLOOD (ROUTINE X 2)
CULTURE: NO GROWTH
CULTURE: NO GROWTH
SPECIAL REQUESTS: ADEQUATE
SPECIAL REQUESTS: ADEQUATE

## 2016-10-24 LAB — MAGNESIUM: Magnesium: 1.7 mg/dL (ref 1.7–2.4)

## 2016-10-24 MED ORDER — PIPERACILLIN-TAZOBACTAM 3.375 G IVPB
3.3750 g | Freq: Three times a day (TID) | INTRAVENOUS | Status: DC
  Administered 2016-10-24 – 2016-10-25 (×3): 3.375 g via INTRAVENOUS
  Filled 2016-10-24 (×3): qty 50

## 2016-10-24 MED ORDER — HYOSCYAMINE SULFATE ER 0.375 MG PO TB12
0.3750 mg | ORAL_TABLET | Freq: Two times a day (BID) | ORAL | Status: DC
  Administered 2016-10-24 – 2016-10-26 (×5): 0.375 mg via ORAL
  Filled 2016-10-24 (×6): qty 1

## 2016-10-24 NOTE — Progress Notes (Addendum)
[place] at [facility]                                                                                                                                                                                  Patient Demographics   Morgan Cisneros, is a 51 y.o. female, DOB - 12/02/1965, NUU:[id number]  Admit date - 10/19/2016   Admitting Physician Baxter Hire, MD  Outpatient Primary MD for the patient is Valerie Roys, DO   LOS - 5  Subjective: Admits of diarrheal stool and no more blood in the stool.Continues to have significant abdominal pain in lower abdomen. Dr. Rosana Hoes feels that patient should be on dual antibiotic therapy, including Zosyn for diverticulitis and vancomycin for C. difficile infection. He also felt that patient may benefit from  patient's care being transferred to surgery   Review of Systems:   CONSTITUTIONAL: No documented fever. No fatigue, weakness. No weight gain, no weight loss.  EYES: No blurry or double vision.  ENT: No tinnitus. No postnasal drip. No redness of the oropharynx.  RESPIRATORY: No cough, no wheeze, no hemoptysis. No dyspnea.  CARDIOVASCULAR: No chest pain. No orthopnea. No palpitations. No syncope.  GASTROINTESTINAL: No nausea, no vomiting . Admits of diarrhea, rectal bleeding, lower abdominal pain, tenesmus. No melena  GENITOURINARY: No dysuria or hematuria.  ENDOCRINE: No polyuria or nocturia. No heat or cold intolerance.  HEMATOLOGY: No anemia. No bruising. No bleeding.  INTEGUMENTARY: No rashes. No lesions.  MUSCULOSKELETAL: No arthritis. No swelling. No gout.  NEUROLOGIC: No numbness, tingling, or ataxia. No seizure-type activity.  PSYCHIATRIC: No anxiety. No insomnia. No ADD.    Vitals:   Vitals:   10/23/16 1351 10/23/16 2055 10/23/16 2059 10/24/16 0513  BP: 120/76 (!) 186/82 128/81 113/79  Pulse: 80 81 77 77  Resp:   (!) 0 20  Temp: 98.6 F (37 C)  98.4 F (36.9 C) 98.8 F (37.1 C)  TempSrc: Oral  Oral  Oral  SpO2: 94%  92% 95%  Weight:      Height:        Wt Readings from Last 3 Encounters:  10/19/16 63.5 kg (140 lb)  10/14/16 65.4 kg (144 lb 4 oz)  09/24/16 66.7 kg (147 lb)     Intake/Output Summary (Last 24 hours) at 10/24/16 1424 Last data filed at 10/24/16 0400  Gross per 24 hour  Intake          1722.17 ml  Output              200 ml  Net          1522.17 ml    Physical Exam:  GENERAL: Pleasant-appearing in no apparent distress.  HEAD, EYES, EARS, NOSE AND THROAT: Atraumatic, normocephalic. Extraocular muscles are intact. Pupils equal and reactive to light. Sclerae anicteric. No conjunctival injection. No oro-pharyngeal erythema.  NECK: Supple. There is no jugular venous distention. No bruits, no lymphadenopathy, no thyromegaly.  HEART: Regular rate and rhythm,. No murmurs, no rubs, no clicks.  LUNGS: Clear to auscultation bilaterally. No rales or rhonchi. No wheezes.  ABDOMEN: Soft, flat, Suprapubic, bilateral lower quadrant discomfort on palpation, no rebound, but voluntary guarding was noted. No organomegaly. Has good bowel sounds. No hepatosplenomegaly appreciated.  EXTREMITIES: No evidence of any cyanosis, clubbing, or peripheral edema.  +2 pedal and radial pulses bilaterally.  NEUROLOGIC: The patient is alert, awake, and oriented x3 with no focal motor or sensory deficits appreciated bilaterally.  SKIN: Moist and warm with no rashes appreciated.  Psych: Not anxious, depressed LN: No inguinal LN enlargement    Antibiotics   Anti-infectives    Start     Dose/Rate Route Frequency Ordered Stop   10/24/16 1400  piperacillin-tazobactam (ZOSYN) IVPB 3.375 g     3.375 g 12.5 mL/hr over 240 Minutes Intravenous Every 8 hours 10/24/16 0952     10/23/16 1800  vancomycin (VANCOCIN) 50 mg/mL oral solution 250 mg     250 mg Oral Every 6 hours 10/23/16 1446     10/22/16 1400  meropenem (MERREM) 1 g in sodium chloride 0.9 % 100 mL IVPB  Status:  Discontinued     1 g 200  mL/hr over 30 Minutes Intravenous Every 8 hours 10/22/16 1340 10/23/16 1446   10/20/16 1400  Ampicillin-Sulbactam (UNASYN) 3 g in sodium chloride 0.9 % 100 mL IVPB  Status:  Discontinued     3 g 200 mL/hr over 30 Minutes Intravenous Every 6 hours 10/20/16 1323 10/22/16 1335   10/19/16 2000  ertapenem (INVANZ) 1 g in sodium chloride 0.9 % 50 mL IVPB  Status:  Discontinued     1 g 100 mL/hr over 30 Minutes Intravenous Every 24 hours 10/19/16 1907 10/20/16 1323   10/19/16 1458  piperacillin-tazobactam (ZOSYN) 3-0.375 GM/50ML IVPB    Comments:  Gwynn Burly   : cabinet override      10/19/16 1458 10/20/16 0314   10/19/16 1445  piperacillin-tazobactam (ZOSYN) IVPB 3.375 g     3.375 g 100 mL/hr over 30 Minutes Intravenous  Once 10/19/16 1432 10/19/16 1717      Medications   Scheduled Meds: . DULoxetine  40 mg Oral Daily  . feeding supplement  1 Container Oral TID BM  . hyoscyamine  0.375 mg Oral Q12H  . LORazepam  1 mg Oral QHS  . vancomycin  250 mg Oral Q6H   Continuous Infusions: . 0.9 % NaCl with KCl 20 mEq / L 125 mL/hr at 10/24/16 0015  . piperacillin-tazobactam (ZOSYN)  IV 3.375 g (10/24/16 1418)   PRN Meds:.acetaminophen **OR** acetaminophen, HYDROmorphone (DILAUDID) injection, iopamidol, ondansetron (ZOFRAN) IV   Data Review:   Micro Results Recent Results (from the past 240 hour(s))  Blood Culture (routine x 2)     Status: None   Collection Time: 10/19/16  2:48 PM  Result Value Ref Range Status   Specimen Description BLOOD BLOOD LEFT HAND  Final   Special Requests   Final    BOTTLES DRAWN AEROBIC AND ANAEROBIC Blood Culture adequate volume   Culture NO GROWTH 5 DAYS  Final   Report Status 10/24/2016 FINAL  Final  Blood Culture (routine x 2)  Status: None   Collection Time: 10/19/16  2:48 PM  Result Value Ref Range Status   Specimen Description BLOOD RIGHT ANTECUBITAL  Final   Special Requests   Final    BOTTLES DRAWN AEROBIC AND ANAEROBIC Blood Culture adequate  volume   Culture NO GROWTH 5 DAYS  Final   Report Status 10/24/2016 FINAL  Final  Gastrointestinal Panel by PCR , Stool     Status: None   Collection Time: 10/23/16 11:00 AM  Result Value Ref Range Status   Campylobacter species NOT DETECTED NOT DETECTED Final   Plesimonas shigelloides NOT DETECTED NOT DETECTED Final   Salmonella species NOT DETECTED NOT DETECTED Final   Yersinia enterocolitica NOT DETECTED NOT DETECTED Final   Vibrio species NOT DETECTED NOT DETECTED Final   Vibrio cholerae NOT DETECTED NOT DETECTED Final   Enteroaggregative E coli (EAEC) NOT DETECTED NOT DETECTED Final   Enteropathogenic E coli (EPEC) NOT DETECTED NOT DETECTED Final   Enterotoxigenic E coli (ETEC) NOT DETECTED NOT DETECTED Final   Shiga like toxin producing E coli (STEC) NOT DETECTED NOT DETECTED Final   Shigella/Enteroinvasive E coli (EIEC) NOT DETECTED NOT DETECTED Final   Cryptosporidium NOT DETECTED NOT DETECTED Final   Cyclospora cayetanensis NOT DETECTED NOT DETECTED Final   Entamoeba histolytica NOT DETECTED NOT DETECTED Final   Giardia lamblia NOT DETECTED NOT DETECTED Final   Adenovirus F40/41 NOT DETECTED NOT DETECTED Final   Astrovirus NOT DETECTED NOT DETECTED Final   Norovirus GI/GII NOT DETECTED NOT DETECTED Final   Rotavirus A NOT DETECTED NOT DETECTED Final   Sapovirus (I, II, IV, and V) NOT DETECTED NOT DETECTED Final  C difficile quick scan w PCR reflex     Status: Abnormal   Collection Time: 10/23/16 11:00 AM  Result Value Ref Range Status   C Diff antigen POSITIVE (A) NEGATIVE Final   C Diff toxin NEGATIVE NEGATIVE Final   C Diff interpretation Results are indeterminate. See PCR results.  Final  Clostridium Difficile by PCR     Status: Abnormal   Collection Time: 10/23/16 11:00 AM  Result Value Ref Range Status   Toxigenic C Difficile by pcr POSITIVE (A) NEGATIVE Final    Comment: Positive for toxigenic C. difficile with little to no toxin production. Only treat if clinical  presentation suggests symptomatic illness.    Radiology Reports Ct Abdomen Pelvis W Contrast  Result Date: 10/22/2016 CLINICAL DATA:  Recent diagnosis of diverticulitis. Patient with persistent and recurrent abdominal pain and nausea. EXAM: CT ABDOMEN AND PELVIS WITH CONTRAST TECHNIQUE: Multidetector CT imaging of the abdomen and pelvis was performed using the standard protocol following bolus administration of intravenous contrast. CONTRAST:  14mL ISOVUE-300 IOPAMIDOL (ISOVUE-300) INJECTION 61% COMPARISON:  10/19/2016; 10/14/2016; 03/11/2014 FINDINGS: Lower chest: Limited visualization of lower thorax demonstrates minimal dependent subpleural ground-glass atelectasis. No focal airspace opacities. No pleural effusion. Normal heart size. No pericardial effusion. Hepatobiliary: Normal hepatic contour. Re- demonstrated approximately 1.2 cm hypoattenuating (10 Hounsfield unit) cyst within in the dome of the right lobe of the liver. Additional hypoattenuating renal lesions are too small to adequately characterize of favored to represent additional renal cysts. Normal appearance of the gallbladder given degree distention. No radiopaque gallstones. No intra extrahepatic bili duct dilatation. There is a small amount of fluid tracking about the caudal aspect of the liver (image 34, series 2). Pancreas: Normal appearance of the pancreas Spleen: Normal appearance of the spleen, however a small amount of fluid is seen adjacent to the splenic contour  within left upper abdominal quadrant (image 20, series 2). Adrenals/Urinary Tract: There is symmetric enhancement and excretion of the bilateral kidneys. Multiple punctate (sub 2 mm) nonobstructing renal stones are seen bilaterally with cluster of non obstructing stones within the superior pole the right kidney is seen on coronal image 59, series 5 and solitary punctate (approximately 1 mm) nonobstructing left renal stent stone seen on coronal image 56, series 5). No renal  stones are seen along the expected course of either ureter or the urinary bladder. No urine obstruction or perinephric stranding. Normal appearance of the bilateral adrenal glands. There is mass effect of the inflammatory process within the sigmoid colon on the left-sided the dome of the urinary bladder. The urinary bladder appears otherwise normal. Stomach/Bowel: Ingested enteric contrast extensive the level of the rectum. Extensive colonic diverticulosis. Re- demonstrated extensive wall thickening involving the sigmoid colon within the left lower abdomen/ pelvis (representative axial images 63 through 69, series 2). Adjacent mesenteric stranding however there is no evidence of perforation or definable/ drainable fluid collection. Overall, the amount of inflammation wall thickening has progressed compared to the 7/17 examination. The cecum is noted to be located within the right mid hemiabdomen. The bowel is otherwise normal in course and caliber without evidence of enteric obstruction. Normal appearance of the terminal ileum and retrocecal appendix. Vascular/Lymphatic: Minimal amount of mixed calcified and noncalcified atherosclerotic plaque with a normal caliber abdominal aorta. The major branch vessels of the abdominal aorta appear patent on this non CTA examination. Scattered retroperitoneal lymph nodes are numerous though individually not enlarged by size criteria with index left common iliac chain lymph node measuring 0.9 cm in diameter (image 47, series 2), likely reactive in etiology. No bulky retroperitoneal, mesenteric, pelvic or inguinal lymphadenopathy. Reproductive: Post hysterectomy. No discrete adnexal lesion. There is a small amount of fluid within the pelvic cul-de-sac. Other: Tiny mesenteric fat containing periumbilical hernia. Musculoskeletal: No acute or aggressive osseous abnormalities. IMPRESSION: 1. Findings suggestive of acute uncomplicated diverticulitis involving the sigmoid colon within  the left lower abdomen/pelvis - while there is no evidence of perforation or definable/drainable fluid collection, the amount of adjacent mesenteric stranding and inflammation has continued to progress compared to the 07/17 examination. 2. No evidence of enteric obstruction. 3. Bilateral nonobstructing nephrolithiasis. 4.  Aortic Atherosclerosis (ICD10-I70.0). Electronically Signed   By: Sandi Mariscal M.D.   On: 10/22/2016 12:26   Ct Abdomen Pelvis W Contrast  Result Date: 10/19/2016 CLINICAL DATA:  Initial evaluation for acute diverticulitis, feeling outpatient management, possible recent abscess. EXAM: CT ABDOMEN AND PELVIS WITH CONTRAST TECHNIQUE: Multidetector CT imaging of the abdomen and pelvis was performed using the standard protocol following bolus administration of intravenous contrast. CONTRAST:  169mL ISOVUE-300 IOPAMIDOL (ISOVUE-300) INJECTION 61% COMPARISON:  Prior CT from 10/14/2016. FINDINGS: Lower chest: Hazy subsegmental atelectatic changes present dependently within the visualized lung bases. Visualized lungs are otherwise clear. Hepatobiliary: Scattered hypodensities noted throughout the liver, likely small cysts and/or possibly biliary hamartomas, grossly stable. Liver otherwise unremarkable. Gallbladder within normal limits. No biliary dilatation. Pancreas: Pancreas within normal limits. Spleen: Spleen within normal limits. Adrenals/Urinary Tract: The adrenal glands are normal. Kidneys equal in size with symmetric enhancement. Punctate 3 mm bilateral nonobstructive nephrolithiasis, greater on the right. No hydronephrosis. subcentimeter hypodensity at the lower pole the left kidney too small the characterize, but statistically likely reflects a small cyst. No focal enhancing renal mass. No hydroureter. Bladder within normal limits. Stomach/Bowel: Small hiatal hernia noted. Stomach otherwise unremarkable. No evidence for bowel  obstruction. Appendix normal. Multiple diverticula again seen  involving the descending and sigmoid colon. There is persistent inflammatory stranding about several diverticula at the sigmoid colon, consistent with acute diverticulitis. Previously question small diverticular abscess is not seen on today's study. This most likely reflected a small inflamed diverticulum upon retrospect. No evidence for perforation, abscess, or other complication identified on today's study. Overall, degree of inflammatory changes has worsened relative to recent exam. Anastomotic suture noted at the rectum. Moderate retained stool elsewhere within the colon, which may reflect underlying constipation. Vascular/Lymphatic: Mild atheromatous plaque within the infrarenal aorta. No aneurysm. Normal intravascular enhancement seen throughout the intra-abdominal aorta and its branch vessels. No adenopathy. Reproductive: Uterus is absent.  Ovaries not discretely identified. Other: Moderate volume free fluid within the pelvis, likely reactive. No free intraperitoneal air. Musculoskeletal: No acute osseous abnormality. No worrisome lytic or blastic osseous lesions. IMPRESSION: 1. Findings consistent with acute diverticulitis, progressed/worsened relative to recent CT from 10/14/2016. No discrete abscess identified on today's examination. No evidence for perforation or other complication. 2. Moderate diffuse stool throughout the remainder of the colon, suggesting underlying constipation. 3. Bilateral nonobstructive nephrolithiasis. Electronically Signed   By: Jeannine Boga M.D.   On: 10/19/2016 17:04   Ct Abdomen Pelvis W Contrast  Result Date: 10/14/2016 CLINICAL DATA:  BILATERAL lower abdominal pain and BILATERAL flank pain for 3 weeks, nausea, history diverticulitis, kidney stones, ovarian cyst, smoker, GERD EXAM: CT ABDOMEN AND PELVIS WITH CONTRAST TECHNIQUE: Multidetector CT imaging of the abdomen and pelvis was performed using the standard protocol following bolus administration of intravenous  contrast. Sagittal and coronal MPR images reconstructed from axial data set. CONTRAST:  169mL ISOVUE-300 IOPAMIDOL (ISOVUE-300) INJECTION 61% IV. Dilute oral contrast. COMPARISON:  03/11/2014 FINDINGS: Lower chest: Minimal dependent atelectasis in posterior lungs Hepatobiliary: Tiny hepatic cysts largest 12 mm greatest size. Minimal focal fatty infiltration of liver adjacent to falciform fissure. Gallbladder and liver otherwise unremarkable. Pancreas: Normal appearance Spleen: Normal appearance Adrenals/Urinary Tract: Adrenal glands normal appearance. Tiny BILATERAL nonobstructing renal calculi. Kidneys, ureters, and bladder otherwise unremarkable. Stomach/Bowel: Normal appendix, retrocecal. Mild wall thickening of the ascending colon extending to hepatic flexure question artifact from underdistention versus colitis. In addition, numerous diverticula of the descending and sigmoid colon with wall thickening and pericolic inflammatory changes at the descending sigmoid junction consistent with acute diverticulitis. Tiny fluid collection at site of inflammation questionably a tiny diverticular abscess, 10 x 7 x 6 mm in size. Stomach and small bowel loops unremarkable. Vascular/Lymphatic: Minimal atherosclerotic calcification aorta and iliac arteries. Aorta normal caliber. No adenopathy. Reproductive: Uterus surgically absent. Nonvisualization of ovaries. Other: No free air or free fluid.  No hernia. Musculoskeletal: No acute osseous findings. IMPRESSION: Diverticulosis of the descending and sigmoid colon with focal acute diverticulitis at the descending sigmoid junction. Questionable tiny diverticular abscess 10 x 7 x 6 mm. Wall thickening of the ascending colon extending to the hepatic flexure, favor colitis though cannot completely exclude artifact from incomplete distention. Tiny BILATERAL nonobstructing renal calculi. Aortic Atherosclerosis (ICD10-I70.0). Electronically Signed   By: Lavonia Dana M.D.   On: 10/14/2016  11:35     CBC  Recent Labs Lab 10/19/16 1341 10/20/16 0437 10/21/16 0501 10/22/16 0548 10/23/16 0502  WBC 12.6* 10.3 10.2 8.2 7.6  HGB 15.8 13.5 12.8 12.8 12.5  HCT 46.4 38.4 36.7 36.8 36.4  PLT 396 319 340 339 367  MCV 93.1 94.7 93.7 95.4 94.8  MCH 31.8 33.3 32.6 33.2 32.7  MCHC 34.1 35.2 34.8 34.8  34.5  RDW 13.8 13.6 13.6 13.3 13.1    Chemistries   Recent Labs Lab 10/19/16 1341 10/21/16 0501 10/22/16 0548 10/23/16 0502  NA 141 141 140 141  K 4.0 3.4* 3.4* 3.4*  CL 106 108 106 105  CO2 25 26 27 26   GLUCOSE 90 101* 92 83  BUN 9 5* <5* <5*  CREATININE 0.66 0.50 0.61 0.59  CALCIUM 9.7 8.4* 8.4* 8.6*  AST 29  --   --   --   ALT 42  --   --   --   ALKPHOS 133*  --   --   --   BILITOT 0.6  --   --   --    ------------------------------------------------------------------------------------------------------------------ estimated creatinine clearance is 76.5 mL/min (by C-G formula based on SCr of 0.59 mg/dL). ------------------------------------------------------------------------------------------------------------------ No results for input(s): HGBA1C in the last 72 hours. ------------------------------------------------------------------------------------------------------------------ No results for input(s): CHOL, HDL, LDLCALC, TRIG, CHOLHDL, LDLDIRECT in the last 72 hours. ------------------------------------------------------------------------------------------------------------------ No results for input(s): TSH, T4TOTAL, T3FREE, THYROIDAB in the last 72 hours.  Invalid input(s): FREET3 ------------------------------------------------------------------------------------------------------------------ No results for input(s): VITAMINB12, FOLATE, FERRITIN, TIBC, IRON, RETICCTPCT in the last 72 hours.  Coagulation profile No results for input(s): INR, PROTIME in the last 168 hours.  No results for input(s): DDIMER in the last 72 hours.  Cardiac Enzymes No  results for input(s): CKMB, TROPONINI, MYOGLOBIN in the last 168 hours.  Invalid input(s): CK ------------------------------------------------------------------------------------------------------------------ Invalid input(s): [place]  #1. Acute diverticulitis, continue patient on Zosyn, following clinically closely, discussed with Dr. Rosana Hoes #2 C. difficile infection, continue vancomycin orally, follow clinically, continue full liquid diet #3 Diarrhea due to above, now off Colace, follow clinically, continue IV fluids, full liquid diet #4. Nicotine abuse , counseled. Yesterday #5. Hypokalemia, supplementing intravenously and orally, check magnesium level #6. Leukocytosis, resolved #7 Hematochezia, resolved, hemoglobin level remains stable      Code Status Orders        Start     Ordered   10/19/16 1908  Full code  Continuous     10/19/16 1907    Code Status History    Date Active Date Inactive Code Status Order ID Comments User Context   This patient has a current code status but no historical code status.           Consultsnone  DVT Prophylaxis   Lab Results  Component Value Date   PLT 367 10/23/2016     Time Spent in minutes   35 min.. Discussed with Dr. Rosana Hoes, Transferring care to Gen. surgery, discussed with patient's family, all questions were answered  Nechama Escutia M.D on 10/24/2016 at 2:24 PM  Between 7am to 6pm - Pager - [phone]  After 6pm go to [email or web address] - password [email or web address] [facility]   Office  [phone]

## 2016-10-24 NOTE — Progress Notes (Addendum)
SURGICAL PROGRESS NOTE (cpt [phone])  Hospital Day(s): 5.   Post op day(s):  Marland Kitchen   Interval History: Patient seen and examined, reports LLQ abdominal pain not improved but no worse than yesterday when her IV antibiotics for acute diverticulitis were discontinued due to +PCR for C Diff antigen with little to no toxin detected, checked for loose BM's. Also stopped yesterday was patient's stool softener. Patient reports 2 - 3 loose BM's, none of which she describes as watery, explosive, or foul-smelling. Patient otherwise describes tolerating full liquids diet, +ambulating, and +flatus and denies fever/chills, N/V, CP, or SOB.  Review of Systems:  Constitutional: denies fever, chills  HEENT: denies cough or congestion  Respiratory: denies any shortness of breath  Cardiovascular: denies chest pain or palpitations  Gastrointestinal: abdominal pain, N/V, and bowel function as per interval history Genitourinary: denies burning with urination or urinary frequency Musculoskeletal: denies pain, decreased motor or sensation Integumentary: denies any other rashes or skin discolorations Neurological: denies HA or vision/hearing changes   Vital signs in last 24 hours: [min-max] current  Temp:  [98.4 F (36.9 C)-98.8 F (37.1 C)] 98.8 F (37.1 C) (07/22 0513) Pulse Rate:  [77-81] 77 (07/22 0513) Resp:  [0-20] 20 (07/22 0513) BP: (113-186)/(76-82) 113/79 (07/22 0513) SpO2:  [92 %-95 %] 95 % (07/22 0513)     Height: 5\' 5"  (165.1 cm) Weight: 140 lb (63.5 kg) BMI (Calculated): 23.3   Intake/Output this shift:  No intake/output data recorded.   Intake/Output last 2 shifts:  @IOLAST2SHIFTS @   Physical Exam:  Constitutional: alert, cooperative and no distress  HENT: normocephalic without obvious abnormality  Eyes: PERRL, EOM's grossly intact and symmetric  Neuro: CN II - XII grossly intact and symmetric without deficit  Respiratory: breathing non-labored at rest  Cardiovascular: regular rate and  sinus rhythm  Gastrointestinal: soft and non-distended with mild - moderate focal LLQ abdominal tenderness to palpation, no guarding or rebound tenderness Musculoskeletal: UE and LE FROM, no edema or wounds, motor and sensation grossly intact, NT   Labs:  CBC Latest Ref Rng & Units 10/23/2016 10/22/2016 10/21/2016  WBC 3.6 - 11.0 K/uL 7.6 8.2 10.2  Hemoglobin 12.0 - 16.0 g/dL 12.5 12.8 12.8  Hematocrit 35.0 - 47.0 % 36.4 36.8 36.7  Platelets 150 - 440 K/uL 367 339 340   CMP Latest Ref Rng & Units 10/23/2016 10/22/2016 10/21/2016  Glucose 65 - 99 mg/dL 83 92 101(H)  BUN 6 - 20 mg/dL <5(L) <5(L) 5(L)  Creatinine 0.44 - 1.00 mg/dL 0.59 0.61 0.50  Sodium 135 - 145 mmol/L 141 140 141  Potassium 3.5 - 5.1 mmol/L 3.4(L) 3.4(L) 3.4(L)  Chloride 101 - 111 mmol/L 105 106 108  CO2 22 - 32 mmol/L 26 27 26   Calcium 8.9 - 10.3 mg/dL 8.6(L) 8.4(L) 8.4(L)  Total Protein 6.5 - 8.1 g/dL - - -  Total Bilirubin 0.3 - 1.2 mg/dL - - -  Alkaline Phos 38 - 126 U/L - - -  AST 15 - 41 U/L - - -  ALT 14 - 54 U/L - - -   C. Diff: antigen - Positive, toxin - "little to no toxin production"  Imaging studies: No new pertinent imaging studies   Assessment/Plan:(ICD-10's: K57.32) 51 y.o.femalewith recurrentuncomplicated sigmoid colonic diveriticulitis (second episode since first episode 3 years ago) that initially improved with outpatient Cipro/Flagyl, but recurred upon completion of antibiotics and did not improve with subsequent oral Cipro/Flagyl, so admitted with persistent abdominal pain despite inpatient Unasyn antibiotic therapy, complicated by pertinent  comorbidities including chronic thoracic back pain, Right nephrolithiasis s/p lithotripsy x 4, ovarian cyst, ongoing chronic tobacco abuse, and insomnia.  - pain control as needed, minimize narcotics - full liquids, decrease rate of IV fluids as PO increases - IV Zosyn per hospital antibiogram for acute diverticulitis  -  PO vancomycin or IV/PO Flagyl if concern for clinically significant C. Diff, though clinically unclear and no to little toxin yesterday  - if current acute episode resolves, patient has expressed interest in outpatient follow-up to discuss elective partial colectomy             - will continue to follow closely and manage accordingly - medical management of comorbidities - ambulation encouraged  - DVT prophylaxis  All of the above findings and recommendations were discussed with the patient and patient's RN, and all of patient's questions were answered to her expressed satisfaction.  Thank you for the opportunity to participate in this patient's care.  -- Marilynne Drivers Rosana Hoes, MD, [place]: [facility] - Partnering for exceptional care. Office: [phone]

## 2016-10-25 DIAGNOSIS — K5792 Diverticulitis of intestine, part unspecified, without perforation or abscess without bleeding: Secondary | ICD-10-CM

## 2016-10-25 LAB — PROCALCITONIN

## 2016-10-25 MED ORDER — CIPROFLOXACIN HCL 500 MG PO TABS
500.0000 mg | ORAL_TABLET | Freq: Two times a day (BID) | ORAL | Status: DC
Start: 2016-10-25 — End: 2016-10-26
  Administered 2016-10-25 – 2016-10-26 (×3): 500 mg via ORAL
  Filled 2016-10-25 (×3): qty 1

## 2016-10-25 MED ORDER — MENTHOL 3 MG MT LOZG
1.0000 | LOZENGE | OROMUCOSAL | Status: DC | PRN
  Filled 2016-10-25 (×2): qty 9

## 2016-10-25 MED ORDER — HYDROCODONE-ACETAMINOPHEN 5-325 MG PO TABS
1.0000 | ORAL_TABLET | ORAL | Status: DC | PRN
  Administered 2016-10-25 – 2016-10-26 (×2): 2 via ORAL
  Filled 2016-10-25 (×3): qty 2

## 2016-10-25 MED ORDER — METRONIDAZOLE 500 MG PO TABS
500.0000 mg | ORAL_TABLET | Freq: Three times a day (TID) | ORAL | Status: DC
  Administered 2016-10-25 – 2016-10-26 (×3): 500 mg via ORAL
  Filled 2016-10-25 (×5): qty 1

## 2016-10-25 NOTE — Progress Notes (Addendum)
CC: Diverticultiis Subjective: Feeling better, some mild int pain. Three soft BM AVSS CT personally reviewed, no abscess or perfotation  Objective: Vital signs in last 24 hours: Temp:  [98.6 F (37 C)-98.8 F (37.1 C)] 98.6 F (37 C) (07/23 0503) Pulse Rate:  [85-86] 85 (07/23 0503) Resp:  [20] 20 (07/23 0503) BP: (106-121)/(67-80) 106/67 (07/23 0503) SpO2:  [96 %] 96 % (07/23 0503) Last BM Date: 10/24/16  Intake/Output from previous day: 07/22 0701 - 07/23 0700 In: 2876.6 [I.V.:2809; IV Piggyback:67.6] Out: -  Intake/Output this shift: No intake/output data recorded.  Physical exam: NAD Abd: soft, NT, no peritonitis Ext: well perfused, no edema Neuro: awake alert, GCS 15  Lab Results: CBC   Recent Labs  10/23/16 0502  WBC 7.6  HGB 12.5  HCT 36.4  PLT 367   BMET  Recent Labs  10/23/16 0502  NA 141  K 3.4*  CL 105  CO2 26  GLUCOSE 83  BUN <5*  CREATININE 0.59  CALCIUM 8.6*   PT/INR No results for input(s): LABPROT, INR in the last 72 hours. ABG No results for input(s): PHART, HCO3 in the last 72 hours.  Invalid input(s): PCO2, PO2  Studies/Results: No results found.  Anti-infectives: Anti-infectives    Start     Dose/Rate Route Frequency Ordered Stop   10/25/16 0930  ciprofloxacin (CIPRO) tablet 500 mg     500 mg Oral 2 times daily 10/25/16 0924     10/25/16 0930  metroNIDAZOLE (FLAGYL) tablet 500 mg     500 mg Oral Every 8 hours 10/25/16 0924     10/24/16 1400  piperacillin-tazobactam (ZOSYN) IVPB 3.375 g  Status:  Discontinued     3.375 g 12.5 mL/hr over 240 Minutes Intravenous Every 8 hours 10/24/16 0952 10/25/16 0924   10/23/16 1800  vancomycin (VANCOCIN) 50 mg/mL oral solution 250 mg     250 mg Oral Every 6 hours 10/23/16 1446     10/22/16 1400  meropenem (MERREM) 1 g in sodium chloride 0.9 % 100 mL IVPB  Status:  Discontinued     1 g 200 mL/hr over 30 Minutes Intravenous Every 8 hours 10/22/16 1340 10/23/16 1446   10/20/16 1400   Ampicillin-Sulbactam (UNASYN) 3 g in sodium chloride 0.9 % 100 mL IVPB  Status:  Discontinued     3 g 200 mL/hr over 30 Minutes Intravenous Every 6 hours 10/20/16 1323 10/22/16 1335   10/19/16 2000  ertapenem (INVANZ) 1 g in sodium chloride 0.9 % 50 mL IVPB  Status:  Discontinued     1 g 100 mL/hr over 30 Minutes Intravenous Every 24 hours 10/19/16 1907 10/20/16 1323   10/19/16 1458  piperacillin-tazobactam (ZOSYN) 3-0.375 GM/50ML IVPB    Comments:  Gwynn Burly   : cabinet override      10/19/16 1458 10/20/16 0314   10/19/16 1445  piperacillin-tazobactam (ZOSYN) IVPB 3.375 g     3.375 g 100 mL/hr over 30 Minutes Intravenous  Once 10/19/16 1432 10/19/16 1717      Assessment/Plan: Recurrent diverticulitis responding to A/bs trx We will transition to cirpo and flagyl Empirically treating for C diff ( toxin is neg but IG is +),  continue vanc for now No emergent surgical intervention She will benefit from elective colectomy given the recurrence and the evidence of previous C diff infection Advance to low residue diet DC in am if she continues to improve I spent 35 min in this encounter w > 50% used in counseling and coordination of  care.  Caroleen Hamman, MD, [facility]  10/25/2016

## 2016-10-26 LAB — CBC
HCT: 35.8 % (ref 35.0–47.0)
HEMOGLOBIN: 12.4 g/dL (ref 12.0–16.0)
MCH: 32.4 pg (ref 26.0–34.0)
MCHC: 34.6 g/dL (ref 32.0–36.0)
MCV: 93.8 fL (ref 80.0–100.0)
Platelets: 493 10*3/uL — ABNORMAL HIGH (ref 150–440)
RBC: 3.82 MIL/uL (ref 3.80–5.20)
RDW: 13.2 % (ref 11.5–14.5)
WBC: 9.5 10*3/uL (ref 3.6–11.0)

## 2016-10-26 MED ORDER — CIPROFLOXACIN HCL 500 MG PO TABS: 500.0000 mg | ORAL_TABLET | Freq: Two times a day (BID) | ORAL | 0 refills | Status: DC

## 2016-10-26 MED ORDER — HYDROCODONE-ACETAMINOPHEN 5-325 MG PO TABS: 1.0000 | ORAL_TABLET | ORAL | 0 refills | Status: DC | PRN

## 2016-10-26 MED ORDER — VANCOMYCIN 50 MG/ML ORAL SOLUTION: 250.0000 mg | Freq: Four times a day (QID) | ORAL | 0 refills | Status: DC

## 2016-10-26 MED ORDER — METRONIDAZOLE 500 MG PO TABS: 500.0000 mg | ORAL_TABLET | Freq: Three times a day (TID) | ORAL | 0 refills | Status: DC

## 2016-10-26 NOTE — Discharge Summary (Signed)
Patient ID: Morgan Cisneros MRN: [id number] DOB/AGE: Nov 26, 1965 51 y.o.  Admit date: 10/19/2016 Discharge date: 10/26/2016   Discharge Diagnoses:  Active Problems:   Diverticulitis   Diverticulitis of large intestine without perforation or abscess without bleeding   Procedures:none  Hospital Course: 51 year old female admitted with abdominal pain and workup revealing evidence of acute diverticulitis without abscess or perforation. She initially was admitted for IV antibiotics and did well initially but started to have diarrhea there was a question about C. difficile colitis and antibiotics were stopped. The CT is actually came back positive only for the immunoglobulin but negative for the toxin therefore and we restart antibiotics and also empirically treat her for her C. difficile. She has significant improvement of symptoms with improvement of diarrhea and abdominal pain. At the time of discharge she was ambulating, tolerating regular diet. Passing gas. Her vital signs were stable and she was afebrile. On physical exam at the time of discharge she was in no acute distress. Awake and alert. Abdomen: Soft, nontender no peritonitis. Extremities: No edema well perfused. Condition at time of discharge is stable I Had a discussion with her that given the recurrence of her symptoms and the evidence of a previous infection with C. difficile she will likely benefit from elective sigmoid colectomy  Consults: Hospitalist  Disposition: 01-Home or Self Care  Discharge Instructions    Call MD for:  difficulty breathing, headache or visual disturbances    Complete by:  As directed    Call MD for:  extreme fatigue    Complete by:  As directed    Call MD for:  hives    Complete by:  As directed    Call MD for:  persistant dizziness or light-headedness    Complete by:  As directed    Call MD for:  persistant nausea and vomiting    Complete by:  As directed    Call MD for:  redness, tenderness, or  signs of infection (pain, swelling, redness, odor or green/yellow discharge around incision site)    Complete by:  As directed    Call MD for:  severe uncontrolled pain    Complete by:  As directed    Call MD for:  temperature >100.4    Complete by:  As directed    Diet - low sodium heart healthy    Complete by:  As directed    Increase activity slowly    Complete by:  As directed      Allergies as of 10/26/2016      Reactions   Urocit-k [potassium Citrate] Nausea Only      Medication List    TAKE these medications   ciprofloxacin 500 MG tablet Commonly known as:  CIPRO Take 1 tablet (500 mg total) by mouth 2 (two) times daily. What changed:  when to take this   DULoxetine HCl 40 MG Cpep Commonly known as:  CYMBALTA Take 40 mg by mouth daily.   HYDROcodone-acetaminophen 5-325 MG tablet Commonly known as:  NORCO/VICODIN Take 1 tablet by mouth every 4 (four) hours as needed for moderate pain. Notes to patient:  Last dose given today at 5:45am   LORazepam 1 MG tablet Commonly known as:  ATIVAN TAKE 1/2 TO 1 TABLET BY MOUTH TWICE DAILY AS NEEDED FOR ANXIETY   metroNIDAZOLE 500 MG tablet Commonly known as:  FLAGYL Take 1 tablet (500 mg total) by mouth 3 (three) times daily. What changed:  when to take this   ranitidine 150 MG  tablet Commonly known as:  ZANTAC Take 150 mg by mouth 2 (two) times daily as needed for heartburn.   vancomycin 50 mg/mL oral solution Commonly known as:  VANCOCIN Take 5 mLs (250 mg total) by mouth every 6 (six) hours.      Follow-up Information    Jules Husbands, MD. Go on 11/17/2016.   Specialty:  General Surgery Why:  Wednesday at 10:00am for hospital follow-up Contact information: [place] [address] [phone]            Caroleen Hamman, MD FACS

## 2016-10-26 NOTE — Progress Notes (Signed)
Patient discharged to home as ordered. Discharge instructions and follow up appointments given as ordered. Patient is alert and oriented c/o of nausea zofran given prior to discharge, patient states that she does not want to wait and states that her nausea is better. Patient is alert and oriented ambulates well without assistance. Patient taken home by her husband.

## 2016-10-27 ENCOUNTER — Encounter: Payer: Self-pay | Admitting: Family Medicine

## 2016-10-27 ENCOUNTER — Ambulatory Visit (INDEPENDENT_AMBULATORY_CARE_PROVIDER_SITE_OTHER): Payer: BC Managed Care – PPO | Admitting: Family Medicine

## 2016-10-27 VITALS — BP 111/77 | HR 98 | Temp 98.5°F

## 2016-10-27 DIAGNOSIS — K5732 Diverticulitis of large intestine without perforation or abscess without bleeding: Secondary | ICD-10-CM | POA: Diagnosis not present

## 2016-10-27 DIAGNOSIS — F419 Anxiety disorder, unspecified: Secondary | ICD-10-CM | POA: Diagnosis not present

## 2016-10-27 MED ORDER — LORAZEPAM 1 MG PO TABS: ORAL_TABLET | ORAL | 0 refills | Status: DC

## 2016-10-27 MED ORDER — SUCRALFATE 1 G PO TABS: 1.0000 g | ORAL_TABLET | Freq: Three times a day (TID) | ORAL | 1 refills | Status: DC

## 2016-10-27 MED ORDER — FLUCONAZOLE 150 MG PO TABS: 150.0000 mg | ORAL_TABLET | Freq: Once | ORAL | 3 refills | Status: AC

## 2016-10-27 MED ORDER — PROMETHAZINE HCL 25 MG PO TABS: 25.0000 mg | ORAL_TABLET | Freq: Three times a day (TID) | ORAL | 0 refills | Status: DC | PRN

## 2016-10-27 NOTE — Patient Instructions (Signed)
Follow up if no improvement 

## 2016-10-27 NOTE — Assessment & Plan Note (Addendum)
Slowly improving, continue PO abx, bland diet. Try carafate and phenergan to help soothe stomach enough to tolerate food. Continue probiotics. Follow up as scheduled with Surgery. Return precautions reviewed

## 2016-10-27 NOTE — Progress Notes (Signed)
BP 111/77   Pulse 98   Temp 98.5 F (36.9 C)   SpO2 96%    Subjective:    Patient ID: Morgan Cisneros, female    DOB: 1966-03-25, 51 y.o.   MRN: [id number]  HPI: Morgan Cisneros is a 51 y.o. female  Chief Complaint  Patient presents with  . Hospitalization Follow-up    Was admitted for Diverticulitis and CDiff. Feeling alittle better. Still can't eat. Sees surgeon in 3 weeks.  Would [facility] be [facility] to a [facility].  . Medication Refill    She needs a refill on the Lorazepam   Patient presents today for hospital follow up for diverticulitis. Was placed on multiple antibiotics during admission with some improvement, but still having severe stabbing lower abdominal pain with eating and drinking. Has f/u with general surgery to discuss sigmoid colectomy on 8/15. Denies fevers or bloody diarrhea since discharge home yesterday. Taking PO abx without issue. Is having some issues with vaginal candidiasis from all of the abx and wanting some diflucan.   Father in law passed away unexpectedly July 31, 2022 additionally, pt having a very hard time coping with everything going on right now. Taking 2 ativan daily with some relief. Would like refill today.   Past Medical History:  Diagnosis Date  . Abdominal pain   . C. difficile colitis 10/2016  . Diverticulitis 10/2016  . Flank pain   . Gross hematuria   . History of kidney stones   . Insomnia   . Ovarian cyst   . Renal calculus, right   . Thoracic back pain    Right sided   Social History   Social History  . Marital status: Married    Spouse name: N/A  . Number of children: N/A  . Years of education: N/A   Occupational History  . Not on file.   Social History Main Topics  . Smoking status: Current Every Day Smoker    Packs/day: 0.25  . Smokeless tobacco: Never Used  . Alcohol use 0.0 oz/week     Comment: occasional  . Drug use: No  . Sexual activity: Yes    Birth control/ protection: Surgical   Other Topics  Concern  . Not on file   Social History Narrative  . No narrative on file   Relevant past medical, surgical, family and social history reviewed and updated as indicated. Interim medical history since our last visit reviewed. Allergies and medications reviewed and updated.  Review of Systems  Constitutional: Negative.   Respiratory: Negative.   Cardiovascular: Negative.   Gastrointestinal: Positive for abdominal pain, diarrhea and nausea.  Genitourinary: Negative.   Musculoskeletal: Negative.   Neurological: Negative.   Psychiatric/Behavioral: The patient is nervous/anxious.     Per HPI unless specifically indicated above     Objective:    BP 111/77   Pulse 98   Temp 98.5 F (36.9 C)   SpO2 96%   Wt Readings from Last 3 Encounters:  10/19/16 140 lb (63.5 kg)  10/14/16 144 lb 4 oz (65.4 kg)  09/24/16 147 lb (66.7 kg)    Physical Exam  Constitutional: She is oriented to person, place, and time. She appears well-developed and well-nourished.  HENT:  Head: Atraumatic.  Eyes: Conjunctivae are normal. No scleral icterus.  Neck: Normal range of motion. Neck supple.  Cardiovascular: Normal rate and normal heart sounds.   Pulmonary/Chest: Effort normal and breath sounds normal. No respiratory distress.  Musculoskeletal: Normal range of motion.  Neurological:  She is alert and oriented to person, place, and time.  Skin: Skin is warm and dry.  Psychiatric: She has a normal mood and affect. Her behavior is normal.  Nursing note and vitals reviewed.     Assessment & Plan:   Problem List Items Addressed This Visit      Digestive   Diverticulitis of large intestine without perforation or abscess without bleeding - Primary    Slowly improving, continue PO abx, bland diet. Try carafate and phenergan to help soothe stomach enough to tolerate food. Continue probiotics. Follow up as scheduled with Surgery. Return precautions reviewed      Relevant Medications   promethazine  (PHENERGAN) 25 MG tablet    Other Visit Diagnoses    Anxiety       Ativan refilled, continue 1-2 tablets daily prn.    Relevant Medications   LORazepam (ATIVAN) 1 MG tablet       Follow up plan: Return for as scheduled with Surgery.

## 2016-10-28 ENCOUNTER — Encounter: Payer: Self-pay | Admitting: Surgery

## 2016-10-28 ENCOUNTER — Other Ambulatory Visit
Admission: RE | Admit: 2016-10-28 | Discharge: 2016-10-28 | Disposition: A | Discharge status: Home / Self Care | Payer: BC Managed Care – PPO | Source: Ambulatory Visit | Attending: Surgery | Admitting: Surgery

## 2016-10-28 ENCOUNTER — Ambulatory Visit (INDEPENDENT_AMBULATORY_CARE_PROVIDER_SITE_OTHER): Payer: BC Managed Care – PPO | Admitting: Surgery

## 2016-10-28 ENCOUNTER — Ambulatory Visit
Admission: RE | Admit: 2016-10-28 | Discharge: 2016-10-28 | Disposition: A | Discharge status: Home / Self Care | Payer: BC Managed Care – PPO | Source: Ambulatory Visit | Attending: Surgery | Admitting: Surgery

## 2016-10-28 ENCOUNTER — Telehealth: Payer: Self-pay

## 2016-10-28 VITALS — BP 124/87 | HR 109 | Temp 98.2°F | Ht 65.0 in | Wt 139.6 lb

## 2016-10-28 DIAGNOSIS — N200 Calculus of kidney: Secondary | ICD-10-CM

## 2016-10-28 DIAGNOSIS — M62830 Muscle spasm of back: Secondary | ICD-10-CM | POA: Insufficient documentation

## 2016-10-28 DIAGNOSIS — I700 Atherosclerosis of aorta: Secondary | ICD-10-CM | POA: Insufficient documentation

## 2016-10-28 DIAGNOSIS — K5732 Diverticulitis of large intestine without perforation or abscess without bleeding: Secondary | ICD-10-CM

## 2016-10-28 DIAGNOSIS — M461 Sacroiliitis, not elsewhere classified: Secondary | ICD-10-CM

## 2016-10-28 HISTORY — DX: Sacroiliitis, not elsewhere classified: M46.1

## 2016-10-28 LAB — COMPREHENSIVE METABOLIC PANEL
ALBUMIN: 3.5 g/dL (ref 3.5–5.0)
ALK PHOS: 111 U/L (ref 38–126)
ALT: 15 U/L (ref 14–54)
AST: 17 U/L (ref 15–41)
Anion gap: 12 (ref 5–15)
BILIRUBIN TOTAL: 0.6 mg/dL (ref 0.3–1.2)
BUN: 9 mg/dL (ref 6–20)
CALCIUM: 9.3 mg/dL (ref 8.9–10.3)
CO2: 26 mmol/L (ref 22–32)
CREATININE: 0.65 mg/dL (ref 0.44–1.00)
Chloride: 102 mmol/L (ref 101–111)
GFR calc Af Amer: >60 mL/min (ref >60)
GFR calc non Af Amer: >60 mL/min (ref >60)
GLUCOSE: 89 mg/dL (ref 65–99)
POTASSIUM: 3.9 mmol/L (ref 3.5–5.1)
Sodium: 140 mmol/L (ref 135–145)
TOTAL PROTEIN: 7.3 g/dL (ref 6.5–8.1)

## 2016-10-28 LAB — CBC WITH DIFFERENTIAL/PLATELET
Basophils Absolute: 0 10*3/uL (ref 0–0.1)
Basophils Relative: 0 %
Eosinophils Absolute: 0.2 10*3/uL (ref 0–0.7)
Eosinophils Relative: 2 %
HCT: 38.6 % (ref 35.0–47.0)
Hemoglobin: 13.2 g/dL (ref 12.0–16.0)
Lymphocytes Relative: 14 %
Lymphs Abs: 1.4 10*3/uL (ref 1.0–3.6)
MCH: 32.1 pg (ref 26.0–34.0)
MCHC: 34.2 g/dL (ref 32.0–36.0)
MCV: 93.9 fL (ref 80.0–100.0)
Monocytes Absolute: 0.4 10*3/uL (ref 0.2–0.9)
Monocytes Relative: 4 %
Neutro Abs: 7.7 10*3/uL — ABNORMAL HIGH (ref 1.4–6.5)
Neutrophils Relative %: 80 %
Platelets: 607 10*3/uL — ABNORMAL HIGH (ref 150–440)
RBC: 4.11 MIL/uL (ref 3.80–5.20)
RDW: 13.9 % (ref 11.5–14.5)
WBC: 9.8 10*3/uL (ref 3.6–11.0)

## 2016-10-28 MED ORDER — IOPAMIDOL (ISOVUE-300) INJECTION 61%
100.0000 mL | Freq: Once | INTRAVENOUS | Status: AC | PRN
  Administered 2016-10-28: 100 mL via INTRAVENOUS

## 2016-10-28 NOTE — Progress Notes (Signed)
Outpatient Surgical Follow Up  10/28/2016  Morgan Cisneros is an 51 y.o. female.   Chief Complaint  Patient presents with  . Hospitalization Follow-up    Diverticulitis D/C from hosp 10/26/16    HPI: 51 yo female recently DC from the hospital After an episode of diverticulitis. She also had C. difficile positive and was properly treated with vancomycin. She is currently on Cipro, Flagyl and vancomycin by mouth. She reports that her last 24 hours has increasing abdominal pain. He is taking by mouth but some decreased appetite. No fevers. She continues to have some loose bowel movements  Past Medical History:  Diagnosis Date  . Abdominal pain   . C. difficile colitis 10/2016  . Diverticulitis 10/2016  . Flank pain   . Gross hematuria   . History of kidney stones   . Insomnia   . Ovarian cyst   . Renal calculus, right   . Thoracic back pain    Right sided    Past Surgical History:  Procedure Laterality Date  . ABDOMINAL HYSTERECTOMY  08/2013  . ABDOMINAL HYSTERECTOMY    . BREAST BIOPSY Left 08/10/2016   [place] core  . CYSTOSCOPY  11/26/2010   with stent placement  . HEMORRHOID SURGERY    . kidney stent    . LITHOTRIPSY     X 4  . TONSILLECTOMY    . TUBAL LIGATION    . Ureteroscopy Right 11/21/1997   with holmium laser    Family History  Problem Relation Age of Onset  . Kidney Stones Mother   . Ovarian cancer Mother   . Colon cancer Mother   . Lung cancer Father   . Hyperlipidemia Sister   . Hypertension Sister   . Alzheimer's disease Maternal Grandmother   . Cancer Maternal Grandfather        Liver  . Alzheimer's disease Paternal Grandfather   . Hypertension Sister   . Hyperlipidemia Sister   . Breast cancer Neg Hx     Social History:  reports that she has been smoking.  She has been smoking about 0.25 packs per day. She has never used smokeless tobacco. She reports that she drinks alcohol. She reports that she does not use drugs.  Allergies:  Allergies   Allergen Reactions  . Urocit-K [Potassium Citrate] Nausea Only    Medications reviewed.  ROS Full ROS performed and is otherwise negative other than what is stated in HPI   BP 124/87   Pulse (!) 109   Temp 98.2 F (36.8 C) (Oral)   Ht 5\' 5"  (1.651 m)   Wt 63.3 kg (139 lb 9.6 oz)   BMI 23.23 kg/m   Physical Exam NAD, non toxic Abd: soft, TTP LLQ, no peritonitis EXt: No edema , well perfused.   No results found for this or any previous visit (from the past 48 hour(s)). No results found.  Assessment/Plan: Worsening abdominal pain concerning for complication related to her diverticulitis. We'll go ahead and order a CT scan of the abdomen and pelvis and labs. We'll have her follow up Dr. Adonis Huguenin in the morning. Currently no need for emergent surgical intervention or hospitalization at this time. We will reevaluate  The need for re admission depending on imaging findings.   Caroleen Hamman, MD [facility] General Surgeon

## 2016-10-28 NOTE — Telephone Encounter (Signed)
Post-op call made to patient at this time. Spoke with Morgan Cisneros. Post-op interview questions below.  1. How are you feeling? Has fever 100.5  2. Is your pain controlled? Yes taking Hydrocodone and Tylenol  3. What are you doing for the pain?   4. Are you having any Nausea or Vomiting? Nausea, Phenergan  from PCP  5. Are you having any Fever or Chills? Yes , 100.5  6. Are you having any Constipation or Diarrhea? Constipation, stool softener.  7. Is there any Swelling or Bruising you are concerned about?   8. Do you have any questions or concerns at this time?    Discussion: Patient still having low grade fever and pain.  Patient added to schedule today.

## 2016-10-28 NOTE — Patient Instructions (Signed)
We will send you to the [facility] at this time to have your CT Scan and labs done. They have been instructed to call the on-call surgeon prior to letting you leave from your scan.  We will see you back in the office tomorrow morning unless your CT results require you to be admitted afterwards.

## 2016-10-29 ENCOUNTER — Encounter: Payer: Self-pay | Admitting: General Surgery

## 2016-10-29 ENCOUNTER — Ambulatory Visit (INDEPENDENT_AMBULATORY_CARE_PROVIDER_SITE_OTHER): Payer: BC Managed Care – PPO | Admitting: General Surgery

## 2016-10-29 VITALS — BP 115/78 | HR 100 | Temp 98.7°F | Ht 65.0 in | Wt 137.0 lb

## 2016-10-29 DIAGNOSIS — K5732 Diverticulitis of large intestine without perforation or abscess without bleeding: Secondary | ICD-10-CM | POA: Diagnosis not present

## 2016-10-29 NOTE — Patient Instructions (Addendum)
We will have you admitted to the Hospital on Sunday and then schedule your surgery on Monday afternoon. Admissions will call you and give you directions on where to go to get checked in on Sunday.   We have discussed removing a portion of your damaged colon through 4 small incisions today. We have scheduled this surgery for 11/01/16 at [facility] with Dr. Adonis Huguenin. Please plan a hospital stay of 3-7 days for surgery and recovery time.  Please call our office with any questions or concerns prior to your scheduled surgery.   Laparoscopic Colectomy Laparoscopic colectomy is surgery to remove part or all of the large intestine (colon). This procedure may be used to treat several conditions, including:  Inflammation and infection of the colon (diverticulitis).  Tumors or masses in the colon.  Inflammatory bowel disease, such as Crohn disease or ulcerative colitis. Colectomy is an option when symptoms cannot be controlled with medicines.  Bleeding from the colon that cannot be controlled by another method.  Blockage or obstruction of the colon.  Tell a health care provider about:  Any allergies you have.  All medicines you are taking, including vitamins, herbs, eye drops, creams, and over-the-counter medicines.  Any problems you or family members have had with anesthetic medicines.  Any blood disorders you have.  Any surgeries you have had.  Any medical conditions you have. What are the risks? Generally, this is a safe procedure. However, problems may occur, including:  Infection.  Bleeding.  Allergic reactions to medicines or dyes.  Damage to other structures or organs.  Leaking from where the colon was sewn together.  Future blockage of the small intestines from scar tissue. Another surgery may be needed to repair this.  Needing to convert to an open procedure. Complications such as damage to other organs or excessive bleeding may require the surgeon to convert from a  laparoscopic procedure to an open procedure. This involves making a larger incision in the abdomen.  What happens before the procedure? Staying hydrated Follow instructions from your health care provider about hydration, which may include:  Up to 2 hours before the procedure - you may continue to drink clear liquids, such as water, clear fruit juice, black coffee, and plain tea.  Eating and drinking restrictions Follow instructions from your health care provider about eating and drinking, which may include:  8 hours before the procedure - stop eating heavy meals, meals with high fiber, or foods such as meat, fried foods, or fatty foods.  6 hours before the procedure - stop eating light meals or foods, such as toast or cereal.  6 hours before the procedure - stop drinking milk or drinks that contain milk.  2 hours before the procedure - stop drinking clear liquids.  Medicines  Ask your health care provider about: ? Changing or stopping your regular medicines. This is especially important if you are taking diabetes medicines or blood thinners. ? Taking medicines such as aspirin and ibuprofen. These medicines can thin your blood. Do not take these medicines before your procedure if your health care provider instructs you not to.  You may be given antibiotic medicine to clean out bacteria from your colon. Follow the directions carefully and take the medicine at the correct time. General instructions  You may be prescribed an oral bowel prep to clean out your colon in preparation for the surgery: ? Follow instructions from your health care provider about how to do this. ? Do not eat or drink anything else after  you have started the bowel prep, unless your health care provider tells you it is safe to do so.  Do not use any products that contain nicotine or tobacco, such as cigarettes and e-cigarettes. If you need help quitting, ask your health care provider. What happens during the  procedure?  To reduce your risk of infection: ? Your health care team will wash or sanitize their hands. ? Your skin will be washed with soap.  An IV tube will be inserted into one of your veins to deliver fluid and medication.  You will be given one of the following: ? A medicine to help you relax (sedative). ? A medicine to make you fall asleep (general anesthetic).  Small monitors will be connected to your body. They will be used to check your heart, blood pressure, and oxygen level.  A breathing tube may be placed into your lungs during the procedure.  A thin, flexible tube (catheter) will be placed into your bladder to drain urine.  A tube may be placed through your nose and into your stomach to drain stomach fluids (nasogastric tube, or NG tube).  Your abdomen will be filled with air so it expands. This gives the surgeon more room to operate and makes your organs easier to see.  Several small cuts (incisions) will be made in your abdomen.  A thin, lighted tube with a tiny camera on the end (laparoscope) will be put through one of the small incisions. The camera on the laparoscope will send a picture to a computer screen in the operating room. This will give the surgeon a good view inside your abdomen.  Hollow tubes will be put through the other small incisions in your abdomen. The tools that are needed for the procedure will be put through these tubes.  Clamps or staples will be put on both ends of the diseased part of the colon.  The part of the intestine between the clamps or staples will be removed.  If possible, the ends of the healthy colon that remain will be stitched (sutured) or stapled together to allow your body to pass waste (stool).  Sometimes, the remaining colon cannot be stitched back together. If this is the case, a colostomy will be needed. If you need a colostomy: ? An opening to the outside of your body (stoma) will be made through your abdomen. ? The end  of your colon will be brought to the opening. It will be stitched to the skin. ? A bag will be attached to the opening. Stool will drain into this removable bag. ? The colostomy may be temporary or permanent.  The incisions from the colectomy will be closed with sutures or staples. The procedure may vary among health care providers and hospitals. What happens after the procedure?  Your blood pressure, heart rate, breathing rate, and blood oxygen level will be monitored until the medicines you were given have worn off.  You will receive fluids through an IV tube until your bowels start to work properly.  Once your bowels are working again, you will be given clear liquids first and then solid food as tolerated.  You will be given medicines to control your pain and nausea, if needed.  Do not drive for 24 hours if you were given a sedative. This information is not intended to replace advice given to you by your health care provider. Make sure you discuss any questions you have with your health care provider. Document Released: 06/12/2002 Document Revised: 12/22/2015  Document Reviewed: 12/22/2015 Elsevier Interactive Patient Education  [employer].     Laparoscopic Colectomy, Care After This sheet gives you information about how to care for yourself after your procedure. Your health care provider may also give you more specific instructions. If you have problems or questions, contact your health care provider. What can I expect after the procedure? After your procedure, it is common to have the following:  Pain in your abdomen, especially in the incision areas. You will be given medicine to control the pain.  Tiredness. This is a normal part of the recovery process. Your energy level will return to normal over the next several weeks.  Changes in your bowel movements, such as constipation or needing to go more often. Talk with your health care provider about how to manage  this.  Follow these instructions at home: Medicines  Take over-the-counter and prescription medicines only as told by your health care provider.  Do not drive or use heavy machinery while taking prescription pain medicine.  Do not drink alcohol while taking prescription pain medicine.  If you were prescribed an antibiotic medicine, use it as told by your health care provider. Do not stop using the antibiotic even if you start to feel better. Incision care  Follow instructions from your health care provider about how to take care of your incision areas. Make sure you: ? Keep your incisions clean and dry. ? Wash your hands with soap and water before and after applying medicine to the areas, and before and after changing your bandage (dressing). If soap and water are not available, use hand sanitizer. ? Change your dressing as told by your health care provider. ? Leave stitches (sutures), skin glue, or adhesive strips in place. These skin closures may need to stay in place for 2 weeks or longer. If adhesive strip edges start to loosen and curl up, you may trim the loose edges. Do not remove adhesive strips completely unless your health care provider tells you to do that.  Do not wear tight clothing over the incisions. Tight clothing may rub and irritate the incision areas, which may cause the incisions to open.  Do not take baths, swim, or use a hot tub until your health care provider approves. Ask your health care provider if you can take showers. You may only be allowed to take sponge baths for bathing.  Check your incision area every day for signs of infection. Check for: ? More redness, swelling, or pain. ? More fluid or blood. ? Warmth. ? Pus or a bad smell. Activity  Avoid lifting anything that is heavier than 10 lb (4.5 kg) for 2 weeks or until your health care provider says it is okay.  You may resume normal activities as told by your health care provider. Ask your health care  provider what activities are safe for you.  Take rest breaks during the day as needed. Eating and drinking  Follow instructions from your health care provider about what you can eat after surgery.  To prevent or treat constipation while you are taking prescription pain medicine, your health care provider may recommend that you: ? Drink enough fluid to keep your urine clear or pale yellow. ? Take over-the-counter or prescription medicines. ? Eat foods that are high in fiber, such as fresh fruits and vegetables, whole grains, and beans. ? Limit foods that are high in fat and processed sugars, such as fried and sweet foods. General instructions  Ask your health care  provider when you will need an appointment to get your sutures or staples removed.  Keep all follow-up visits as told by your health care provider. This is important. Contact a health care provider if:  You have more redness, swelling, or pain around your incisions.  You have more fluid or blood coming from the incisions.  Your incisions feel warm to the touch.  You have pus or a bad smell coming from your incisions or your dressing.  You have a fever.  You have an incision that breaks open (edges not staying together) after sutures or staples have been removed. Get help right away if:  You develop a rash.  You have chest pain or difficulty breathing.  You have pain or swelling in your legs.  You feel light-headed or you faint.  Your abdomen swells (becomes distended).  You have nausea or vomiting.  You have blood in your stool (feces). This information is not intended to replace advice given to you by your health care provider. Make sure you discuss any questions you have with your health care provider. Document Released: 10/09/2004 Document Revised: 12/22/2015 Document Reviewed: 12/22/2015 Elsevier Interactive Patient Education  [employer].

## 2016-10-29 NOTE — Progress Notes (Signed)
Outpatient Surgical Follow Up  10/29/2016  Morgan Cisneros is an 51 y.o. female.   Chief Complaint  Patient presents with  . Follow-up    Diverticulitis and STAT Abdominal CT (per Dr. Dahlia Byes)    HPI: 51 year old female returns to clinic for follow-up for her personal history of diverticulitis. She was seen yesterday in clinic and sent for labs and CT scan. The CT scan shows continued diverticulitis without improvement. Patient continues to be tachycardic and have a general feeling of malaise. She has been on antibiotics now for 6 weeks without any symptomatic relief. Patient is very emotional and tired of feeling ill. Her pain continues to be in the left lower quadrant and does not radiate. It is worse with activity or palpation. It is improved with oral pain medications. She states she is eating well but having copious amounts of diarrhea. She denies any current fevers, chills, chest pain, shortness of breath, nausea, vomiting.  Past Medical History:  Diagnosis Date  . Abdominal pain   . C. difficile colitis 10/2016  . Diverticulitis 10/2016  . Flank pain   . Gross hematuria   . History of kidney stones   . Insomnia   . Ovarian cyst   . Renal calculus, right   . Thoracic back pain    Right sided    Past Surgical History:  Procedure Laterality Date  . ABDOMINAL HYSTERECTOMY  08/2013  . ABDOMINAL HYSTERECTOMY    . BREAST BIOPSY Left 08/10/2016   [place] core  . CYSTOSCOPY  11/26/2010   with stent placement  . HEMORRHOID SURGERY    . kidney stent    . LITHOTRIPSY     X 4  . TONSILLECTOMY    . TUBAL LIGATION    . Ureteroscopy Right 11/21/1997   with holmium laser    Family History  Problem Relation Age of Onset  . Kidney Stones Mother   . Ovarian cancer Mother   . Colon cancer Mother   . Lung cancer Father   . Hyperlipidemia Sister   . Hypertension Sister   . Alzheimer's disease Maternal Grandmother   . Cancer Maternal Grandfather        Liver  . Alzheimer's disease  Paternal Grandfather   . Hypertension Sister   . Hyperlipidemia Sister   . Breast cancer Neg Hx     Social History:  reports that she has been smoking Cigarettes.  She has been smoking about 0.25 packs per day. She has never used smokeless tobacco. She reports that she drinks alcohol. She reports that she does not use drugs.  Allergies:  Allergies  Allergen Reactions  . Urocit-K [Potassium Citrate] Nausea Only    Medications reviewed.    ROS A multipoint review of systems was completed, all pertinent positives and negatives are documented within the history of present illness and remainder are negative.   BP 115/78   Pulse 100   Temp 98.7 F (37.1 C) (Oral)   Ht '5\' 5"'$  (1.651 m)   Wt 62.1 kg (137 lb)   BMI 22.80 kg/m   Physical Exam Gen.: No acute distress Neck: Supple and nontender Chest: Clear to auscultation  heart: Tachycardic Abdomen: Soft, tender to palpation in the left lower quadrant but without rebound. Extremities: Moves all extremities well.    Results for orders placed or performed during the hospital encounter of 10/28/16 (from the past 48 hour(s))  CBC with Differential/Platelet     Status: Abnormal   Collection Time: 10/28/16  3:47 PM  Result Value Ref Range   WBC 9.8 3.6 - 11.0 K/uL   RBC 4.11 3.80 - 5.20 MIL/uL   Hemoglobin 13.2 12.0 - 16.0 g/dL   HCT 38.6 35.0 - 47.0 %   MCV 93.9 80.0 - 100.0 fL   MCH 32.1 26.0 - 34.0 pg   MCHC 34.2 32.0 - 36.0 g/dL   RDW 13.9 11.5 - 14.5 %   Platelets 607 (H) 150 - 440 K/uL   Neutrophils Relative % 80 %   Neutro Abs 7.7 (H) 1.4 - 6.5 K/uL   Lymphocytes Relative 14 %   Lymphs Abs 1.4 1.0 - 3.6 K/uL   Monocytes Relative 4 %   Monocytes Absolute 0.4 0.2 - 0.9 K/uL   Eosinophils Relative 2 %   Eosinophils Absolute 0.2 0 - 0.7 K/uL   Basophils Relative 0 %   Basophils Absolute 0.0 0 - 0.1 K/uL  Comprehensive metabolic panel     Status: None   Collection Time: 10/28/16  3:47 PM  Result Value Ref Range    Sodium 140 135 - 145 mmol/L   Potassium 3.9 3.5 - 5.1 mmol/L   Chloride 102 101 - 111 mmol/L   CO2 26 22 - 32 mmol/L   Glucose, Bld 89 65 - 99 mg/dL   BUN 9 6 - 20 mg/dL   Creatinine, Ser 0.65 0.44 - 1.00 mg/dL   Calcium 9.3 8.9 - 10.3 mg/dL   Total Protein 7.3 6.5 - 8.1 g/dL   Albumin 3.5 3.5 - 5.0 g/dL   AST 17 15 - 41 U/L   ALT 15 14 - 54 U/L   Alkaline Phosphatase 111 38 - 126 U/L   Total Bilirubin 0.6 0.3 - 1.2 mg/dL   GFR calc non Af Amer >60 >60 mL/min   GFR calc Af Amer >60 >60 mL/min    Comment: (NOTE) The eGFR has been calculated using the CKD EPI equation. This calculation has not been validated in all clinical situations. eGFR's persistently <60 mL/min signify possible Chronic Kidney Disease.    Anion gap 12 5 - 15   Ct Abdomen Pelvis W Contrast  Result Date: 10/28/2016 CLINICAL DATA:  51 year old female with a history of left lower quadrant abdominal pain EXAM: CT ABDOMEN AND PELVIS WITH CONTRAST TECHNIQUE: Multidetector CT imaging of the abdomen and pelvis was performed using the standard protocol following bolus administration of intravenous contrast. CONTRAST:  121m ISOVUE-300 IOPAMIDOL (ISOVUE-300) INJECTION 61% COMPARISON:  10/22/2016, 02/01/2012 FINDINGS: Lower chest: Mild pericardial fluid/ thickening. Hepatobiliary: Multiple low-density rounded cystic structures of liver parenchyma. Number and size relatively unchanged dating to the CT 02/01/2012. Unremarkable appearance of gallbladder. Pancreas: Unremarkable pancreas Spleen: Unremarkable spleen Adrenals/Urinary Tract: Unremarkable bilateral adrenal glands. Nonobstructive right-sided nephrolithiasis, with several small stones. No hydronephrosis. Unremarkable course of the right ureter. 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 No hydronephrosis. Single tiny stone at the lower pole collecting system. Unremarkable course of the left ureter. Stomach/Bowel: Inflammatory changes involving the sigmoid colon and the  adjacent mesenteric fat, unchanged in distribution from the comparison CT. While there is free fluid within the peritoneal leaves, there is no evidence of abscess. No extraluminal gas. Surgical suture line at the rectum. Normal appendix. No abnormally distended small bowel to indicate obstruction. Vascular/Lymphatic: No aneurysm. Minimal calcifications of the abdominal aorta. Proximal femoral vasculature patent. Mesenteric vasculature patent. Bilateral renal arteries patent. Reproductive: Hysterectomy Other: No abdominal hernia. Musculoskeletal: No acute fracture. No significant degenerative changes. IMPRESSION: CT again demonstrates acute sigmoid diverticulitis, with adjacent  inflammatory changes and no evidence of abscess or perforation. Surgical suture line at the rectum. Recommend correlation with patient surgical history. Bilateral nonobstructive nephrolithiasis. Aortic Atherosclerosis (ICD10-I70.0). Electronically Signed   By: Corrie Mckusick D.O.   On: 10/28/2016 17:44    Assessment/Plan:  1. Diverticulitis of large intestine without perforation or abscess without bleeding 51 year old female with continued sigmoid diverticulitis. Given her clinical picture I offered her admission from clinic today. Upon which the patient became very emotional and she asked if she could come in on Sunday as she has the funeral of her father-in-law to attend to tomorrow. Discussed at length that the patient cannot maintain hydration she needs to be in the hospital. She continues to request delaying admission until Sunday. She states she will attempt her best a forced liquids to maintain hydration. Discussed the planned surgery that she requires of a sigmoid colectomy with likely end colostomy versus loop ileostomy. The risks, benefits, alternatives of the surgery were discussed and she voiced understanding. She desires to proceed with me on Monday. Discussed that although I'm working the night shift Monday night that I  would come in early to perform the surgery with my partner, Dr.Piscoya. Also discussed that upon presentation on Sunday should she be significantly worse she would likely be offered surgery by our senior partner, Dr. Burt Knack. Dr. Burt Knack will be here when she comes into the hospital on Sunday for admission. Patient voiced understanding and agrees to this plan.  A total of 25 minutes was used on this encounter greater than 50% of it user counseling and coordination of care.   Clayburn Pert, MD FACS General Surgeon  10/29/2016,1:15 PM

## 2016-10-31 ENCOUNTER — Inpatient Hospital Stay
Admission: RE | Admit: 2016-10-31 | Discharge: 2016-11-04 | DRG: 330 | Disposition: A | Discharge status: Home / Self Care | Payer: BC Managed Care – PPO | Source: Ambulatory Visit | Attending: General Surgery | Admitting: General Surgery

## 2016-10-31 ENCOUNTER — Inpatient Hospital Stay: Admission: RE | Admit: 2016-10-31 | Payer: BC Managed Care – PPO | Source: Ambulatory Visit | Admitting: General Surgery

## 2016-10-31 ENCOUNTER — Encounter: Payer: Self-pay | Admitting: *Deleted

## 2016-10-31 DIAGNOSIS — I700 Atherosclerosis of aorta: Secondary | ICD-10-CM | POA: Diagnosis present

## 2016-10-31 DIAGNOSIS — A0472 Enterocolitis due to Clostridium difficile, not specified as recurrent: Secondary | ICD-10-CM | POA: Diagnosis present

## 2016-10-31 DIAGNOSIS — Z888 Allergy status to other drugs, medicaments and biological substances status: Secondary | ICD-10-CM | POA: Diagnosis not present

## 2016-10-31 DIAGNOSIS — K5792 Diverticulitis of intestine, part unspecified, without perforation or abscess without bleeding: Secondary | ICD-10-CM | POA: Diagnosis present

## 2016-10-31 DIAGNOSIS — Z90710 Acquired absence of both cervix and uterus: Secondary | ICD-10-CM | POA: Diagnosis not present

## 2016-10-31 DIAGNOSIS — F17210 Nicotine dependence, cigarettes, uncomplicated: Secondary | ICD-10-CM | POA: Diagnosis present

## 2016-10-31 DIAGNOSIS — Z8349 Family history of other endocrine, nutritional and metabolic diseases: Secondary | ICD-10-CM

## 2016-10-31 DIAGNOSIS — K5732 Diverticulitis of large intestine without perforation or abscess without bleeding: Secondary | ICD-10-CM | POA: Diagnosis present

## 2016-10-31 DIAGNOSIS — Z8249 Family history of ischemic heart disease and other diseases of the circulatory system: Secondary | ICD-10-CM

## 2016-10-31 DIAGNOSIS — Z8041 Family history of malignant neoplasm of ovary: Secondary | ICD-10-CM

## 2016-10-31 DIAGNOSIS — K660 Peritoneal adhesions (postprocedural) (postinfection): Secondary | ICD-10-CM | POA: Diagnosis present

## 2016-10-31 DIAGNOSIS — N200 Calculus of kidney: Secondary | ICD-10-CM | POA: Diagnosis present

## 2016-10-31 DIAGNOSIS — K219 Gastro-esophageal reflux disease without esophagitis: Secondary | ICD-10-CM | POA: Diagnosis present

## 2016-10-31 LAB — SURGICAL PCR SCREEN
MRSA, PCR: NEGATIVE
STAPHYLOCOCCUS AUREUS: NEGATIVE

## 2016-10-31 LAB — MAGNESIUM: MAGNESIUM: 1.8 mg/dL (ref 1.7–2.4)

## 2016-10-31 LAB — CBC WITH DIFFERENTIAL/PLATELET
BASOS ABS: 0.1 10*3/uL (ref 0–0.1)
BASOS PCT: 1 %
EOS PCT: 4 %
Eosinophils Absolute: 0.2 10*3/uL (ref 0–0.7)
HCT: 39.1 % (ref 35.0–47.0)
Hemoglobin: 13.6 g/dL (ref 12.0–16.0)
Lymphocytes Relative: 29 %
Lymphs Abs: 1.7 10*3/uL (ref 1.0–3.6)
MCH: 32.4 pg (ref 26.0–34.0)
MCHC: 34.9 g/dL (ref 32.0–36.0)
MCV: 92.9 fL (ref 80.0–100.0)
MONO ABS: 0.3 10*3/uL (ref 0.2–0.9)
Monocytes Relative: 6 %
Neutro Abs: 3.4 10*3/uL (ref 1.4–6.5)
Neutrophils Relative %: 60 %
PLATELETS: 610 10*3/uL — AB (ref 150–440)
RBC: 4.2 MIL/uL (ref 3.80–5.20)
RDW: 13.6 % (ref 11.5–14.5)
WBC: 5.8 10*3/uL (ref 3.6–11.0)

## 2016-10-31 LAB — COMPREHENSIVE METABOLIC PANEL
ALBUMIN: 3.7 g/dL (ref 3.5–5.0)
ALT: 23 U/L (ref 14–54)
AST: 37 U/L (ref 15–41)
Alkaline Phosphatase: 114 U/L (ref 38–126)
Anion gap: 11 (ref 5–15)
BUN: 6 mg/dL (ref 6–20)
CHLORIDE: 105 mmol/L (ref 101–111)
CO2: 27 mmol/L (ref 22–32)
Calcium: 9.5 mg/dL (ref 8.9–10.3)
Creatinine, Ser: 0.69 mg/dL (ref 0.44–1.00)
GFR calc Af Amer: >60 mL/min (ref >60)
GFR calc non Af Amer: >60 mL/min (ref >60)
GLUCOSE: 94 mg/dL (ref 65–99)
POTASSIUM: 4.1 mmol/L (ref 3.5–5.1)
SODIUM: 143 mmol/L (ref 135–145)
Total Bilirubin: 0.4 mg/dL (ref 0.3–1.2)
Total Protein: 7.3 g/dL (ref 6.5–8.1)

## 2016-10-31 LAB — PROTIME-INR
INR: 1.18
Prothrombin Time: 15.1 seconds (ref 11.4–15.2)

## 2016-10-31 LAB — APTT: APTT: 34 s (ref 24–36)

## 2016-10-31 LAB — PHOSPHORUS: Phosphorus: 4 mg/dL (ref 2.5–4.6)

## 2016-10-31 MED ORDER — ONDANSETRON 4 MG PO TBDP
4.0000 mg | ORAL_TABLET | Freq: Four times a day (QID) | ORAL | Status: DC | PRN
  Administered 2016-11-03 – 2016-11-04 (×2): 4 mg via ORAL
  Filled 2016-10-31 (×2): qty 1

## 2016-10-31 MED ORDER — METRONIDAZOLE IN NACL 5-0.79 MG/ML-% IV SOLN
500.0000 mg | Freq: Three times a day (TID) | INTRAVENOUS | Status: DC
  Administered 2016-10-31 – 2016-11-03 (×10): 500 mg via INTRAVENOUS
  Filled 2016-10-31 (×13): qty 100

## 2016-10-31 MED ORDER — VANCOMYCIN 50 MG/ML ORAL SOLUTION
250.0000 mg | Freq: Four times a day (QID) | ORAL | Status: DC
  Administered 2016-10-31 – 2016-11-01 (×3): 250 mg via ORAL
  Filled 2016-10-31 (×6): qty 5

## 2016-10-31 MED ORDER — FLEET ENEMA 7-19 GM/118ML RE ENEM
1.0000 | ENEMA | Freq: Once | RECTAL | Status: AC
  Administered 2016-10-31: 1 via RECTAL

## 2016-10-31 MED ORDER — MORPHINE SULFATE (PF) 4 MG/ML IV SOLN
4.0000 mg | INTRAVENOUS | Status: DC | PRN
  Administered 2016-11-01 – 2016-11-02 (×5): 4 mg via INTRAVENOUS
  Filled 2016-10-31 (×6): qty 1

## 2016-10-31 MED ORDER — VANCOMYCIN HCL IN DEXTROSE 1-5 GM/200ML-% IV SOLN
1000.0000 mg | INTRAVENOUS | Status: AC
  Administered 2016-11-01: 1000 mg via INTRAVENOUS
  Filled 2016-10-31: qty 200

## 2016-10-31 MED ORDER — PANTOPRAZOLE SODIUM 40 MG IV SOLR
40.0000 mg | Freq: Every day | INTRAVENOUS | Status: DC
  Administered 2016-10-31 – 2016-11-02 (×3): 40 mg via INTRAVENOUS
  Filled 2016-10-31 (×4): qty 40

## 2016-10-31 MED ORDER — MAGNESIUM CITRATE PO SOLN
1.0000 | Freq: Once | ORAL | Status: AC
  Administered 2016-10-31: 1 via ORAL
  Filled 2016-10-31: qty 296

## 2016-10-31 MED ORDER — DEXTROSE IN LACTATED RINGERS 5 % IV SOLN
INTRAVENOUS | Status: DC
  Administered 2016-10-31 – 2016-11-03 (×6): via INTRAVENOUS

## 2016-10-31 MED ORDER — HYDRALAZINE HCL 20 MG/ML IJ SOLN: 10.0000 mg | INTRAMUSCULAR | Status: DC | PRN

## 2016-10-31 MED ORDER — ENOXAPARIN SODIUM 40 MG/0.4ML ~~LOC~~ SOLN
40.0000 mg | SUBCUTANEOUS | Status: DC
  Administered 2016-10-31 – 2016-11-03 (×4): 40 mg via SUBCUTANEOUS
  Filled 2016-10-31 (×5): qty 0.4

## 2016-10-31 MED ORDER — DIPHENHYDRAMINE HCL 50 MG/ML IJ SOLN: 25.0000 mg | Freq: Four times a day (QID) | INTRAMUSCULAR | Status: DC | PRN

## 2016-10-31 MED ORDER — ONDANSETRON HCL 4 MG/2ML IJ SOLN
4.0000 mg | Freq: Four times a day (QID) | INTRAMUSCULAR | Status: DC | PRN
  Administered 2016-10-31 – 2016-11-02 (×5): 4 mg via INTRAVENOUS
  Filled 2016-10-31 (×4): qty 2

## 2016-10-31 MED ORDER — DIPHENHYDRAMINE HCL 25 MG PO CAPS: 25.0000 mg | ORAL_CAPSULE | Freq: Four times a day (QID) | ORAL | Status: DC | PRN

## 2016-10-31 MED ORDER — PIPERACILLIN-TAZOBACTAM 3.375 G IVPB
3.3750 g | Freq: Three times a day (TID) | INTRAVENOUS | Status: DC
  Administered 2016-10-31 – 2016-11-01 (×3): 3.375 g via INTRAVENOUS
  Filled 2016-10-31 (×4): qty 50

## 2016-10-31 NOTE — Progress Notes (Signed)
Vancomycin for surgical prophylaxis:  Ordered vancomycin 1000 mg IV on call to OR for surgical prophylaxis. Per protocol.  Lenis Noon, PharmD 10/31/16 1:41 PM

## 2016-10-31 NOTE — Progress Notes (Signed)
See Dr. Reginal Lutes note from Friday for complete H&P  The patient was scheduled to come in to do the hospital today for planned urgent surgery tomorrow for likely Hartman's procedure with end colostomy. This is for perforated diverticulitis which has been refractory to multiple bouts of antibiotics. She's also had C. difficile colitis in association with this. Dr. Adonis Huguenin had described for me and this was confirmed with the patient and her family that she was very very ill on Friday. I expected to see a very sick patient today on admission. Instead she appears quite well and in fact states that she has not had any abdominal pain or required pain medications for 2 days. She feels much better and has had no fevers or chills. When she eats she has diarrhea however.  Vital signs are reviewed.  Abdomen is soft nondistended nontympanitic and essentially nontender no guarding rebound or percussion tenderness. No left lower quadrant tenderness Calves are nontender Awake alert and oriented  CT scan from Thursday is personally reviewed.  Telephone call with Dr. Adonis Huguenin was performed after meeting with the patient and family.   The patient's husband brought up the idea that she is doing so much better good a single stage operation be considered. I discussed with them the 2 real assessed to these for a successful single stage operation which is a mechanical bowel prep as well as minimized inflammatory process. Her repeated CT scan from Thursday failed to identify any signs of an abscess .this patient was believed to have severe refractory diverticulitis and possible C. difficile colitis. She certainly would benefit from an urgent operation in these circumstances.  Ultimately the decision to proceed with a single stage operation without a colostomy or protecting ileostomy could only be made in the operating room once the amount of inflammation is identified. The potential for single stage operation however exists  in this patient who appears very different from what was described as a patient very ill on Friday. I am skeptical that the inflammatory process has diminished considerably over such a short period time frame however the plan would be to try a mild bowel prep and possibly avoid a colostomy tomorrow in the operating room. She definitely needs an operation at this point and if a colostomy can be avoided that would be to her benefit.  Dr. Adonis Huguenin was in agreement with this plan as well as the patient and her family. We'll reassess in the morning after a mild bowel prep.

## 2016-11-01 ENCOUNTER — Encounter: Payer: Self-pay | Admitting: Anesthesiology

## 2016-11-01 ENCOUNTER — Inpatient Hospital Stay: Payer: BC Managed Care – PPO | Admitting: Anesthesiology

## 2016-11-01 ENCOUNTER — Encounter
Admission: RE | Disposition: A | Discharge status: Home / Self Care | Payer: Self-pay | Source: Ambulatory Visit | Attending: General Surgery

## 2016-11-01 DIAGNOSIS — K5792 Diverticulitis of intestine, part unspecified, without perforation or abscess without bleeding: Secondary | ICD-10-CM

## 2016-11-01 HISTORY — PX: LAPAROSCOPIC SIGMOID COLECTOMY: SHX5928

## 2016-11-01 SURGERY — COLECTOMY, SIGMOID, LAPAROSCOPIC
Anesthesia: General | Site: Abdomen | Wound class: Clean Contaminated

## 2016-11-01 MED ORDER — LACTATED RINGERS IV SOLN
INTRAVENOUS | Status: DC | PRN
  Administered 2016-11-01 (×2): via INTRAVENOUS

## 2016-11-01 MED ORDER — LACTATED RINGERS IV SOLN
INTRAVENOUS | Status: DC | PRN
  Administered 2016-11-01: 16:00:00 via INTRAVENOUS

## 2016-11-01 MED ORDER — HYDROMORPHONE HCL 1 MG/ML IJ SOLN
INTRAMUSCULAR | Status: AC
  Filled 2016-11-01: qty 1

## 2016-11-01 MED ORDER — SODIUM CHLORIDE 0.9 % IJ SOLN
INTRAMUSCULAR | Status: AC
  Filled 2016-11-01: qty 10

## 2016-11-01 MED ORDER — ONDANSETRON HCL 4 MG/2ML IJ SOLN: 4.0000 mg | Freq: Once | INTRAMUSCULAR | Status: DC | PRN

## 2016-11-01 MED ORDER — ROCURONIUM BROMIDE 50 MG/5ML IV SOLN
INTRAVENOUS | Status: AC
  Filled 2016-11-01: qty 1

## 2016-11-01 MED ORDER — BUPIVACAINE HCL (PF) 0.5 % IJ SOLN
INTRAMUSCULAR | Status: AC
  Filled 2016-11-01: qty 30

## 2016-11-01 MED ORDER — SUGAMMADEX SODIUM 200 MG/2ML IV SOLN
INTRAVENOUS | Status: DC | PRN
  Administered 2016-11-01: 130 mg via INTRAVENOUS

## 2016-11-01 MED ORDER — LIDOCAINE HCL (PF) 1 % IJ SOLN
INTRAMUSCULAR | Status: AC
  Filled 2016-11-01: qty 30

## 2016-11-01 MED ORDER — PROMETHAZINE HCL 25 MG/ML IJ SOLN
25.0000 mg | Freq: Four times a day (QID) | INTRAMUSCULAR | Status: DC | PRN
  Administered 2016-11-01 – 2016-11-03 (×4): 25 mg via INTRAVENOUS
  Filled 2016-11-01 (×4): qty 1

## 2016-11-01 MED ORDER — ONDANSETRON HCL 4 MG/2ML IJ SOLN
INTRAMUSCULAR | Status: AC
  Filled 2016-11-01: qty 2

## 2016-11-01 MED ORDER — KETOROLAC TROMETHAMINE 30 MG/ML IJ SOLN
30.0000 mg | Freq: Four times a day (QID) | INTRAMUSCULAR | Status: DC
  Administered 2016-11-02 – 2016-11-03 (×8): 30 mg via INTRAVENOUS
  Filled 2016-11-01 (×8): qty 1

## 2016-11-01 MED ORDER — PROPOFOL 10 MG/ML IV BOLUS
INTRAVENOUS | Status: DC | PRN
  Administered 2016-11-01: 130 mg via INTRAVENOUS

## 2016-11-01 MED ORDER — FENTANYL CITRATE (PF) 100 MCG/2ML IJ SOLN
INTRAMUSCULAR | Status: AC
  Filled 2016-11-01: qty 2

## 2016-11-01 MED ORDER — LIDOCAINE HCL (PF) 2 % IJ SOLN
INTRAMUSCULAR | Status: AC
  Filled 2016-11-01: qty 2

## 2016-11-01 MED ORDER — PROPOFOL 10 MG/ML IV BOLUS
INTRAVENOUS | Status: AC
  Filled 2016-11-01: qty 20

## 2016-11-01 MED ORDER — FENTANYL CITRATE (PF) 100 MCG/2ML IJ SOLN
INTRAMUSCULAR | Status: DC | PRN
  Administered 2016-11-01 (×5): 50 ug via INTRAVENOUS
  Administered 2016-11-01: 100 ug via INTRAVENOUS

## 2016-11-01 MED ORDER — HYDROMORPHONE HCL 1 MG/ML IJ SOLN
0.5000 mg | INTRAMUSCULAR | Status: DC | PRN
  Administered 2016-11-01 (×2): 0.5 mg via INTRAVENOUS

## 2016-11-01 MED ORDER — SODIUM CHLORIDE FLUSH 0.9 % IV SOLN
INTRAVENOUS | Status: AC
  Filled 2016-11-01: qty 3

## 2016-11-01 MED ORDER — DEXAMETHASONE SODIUM PHOSPHATE 10 MG/ML IJ SOLN
INTRAMUSCULAR | Status: AC
  Filled 2016-11-01: qty 1

## 2016-11-01 MED ORDER — MIDAZOLAM HCL 2 MG/2ML IJ SOLN
INTRAMUSCULAR | Status: AC
  Filled 2016-11-01: qty 2

## 2016-11-01 MED ORDER — LIDOCAINE HCL (PF) 1 % IJ SOLN
INTRAMUSCULAR | Status: DC | PRN
  Administered 2016-11-01: 3.5 mL

## 2016-11-01 MED ORDER — ACETAMINOPHEN 325 MG PO TABS
650.0000 mg | ORAL_TABLET | Freq: Four times a day (QID) | ORAL | Status: DC | PRN
  Administered 2016-11-01: 650 mg via ORAL
  Filled 2016-11-01: qty 2

## 2016-11-01 MED ORDER — MIDAZOLAM HCL 2 MG/2ML IJ SOLN
INTRAMUSCULAR | Status: DC | PRN
  Administered 2016-11-01: 2 mg via INTRAVENOUS

## 2016-11-01 MED ORDER — FENTANYL CITRATE (PF) 100 MCG/2ML IJ SOLN
25.0000 ug | INTRAMUSCULAR | Status: DC | PRN
Start: 2016-11-01 — End: 2016-11-01
  Administered 2016-11-01 (×4): 25 ug via INTRAVENOUS

## 2016-11-01 MED ORDER — DEXAMETHASONE SODIUM PHOSPHATE 10 MG/ML IJ SOLN
INTRAMUSCULAR | Status: DC | PRN
  Administered 2016-11-01: 10 mg via INTRAVENOUS

## 2016-11-01 MED ORDER — BUPIVACAINE HCL (PF) 0.5 % IJ SOLN
INTRAMUSCULAR | Status: DC | PRN
  Administered 2016-11-01: 3.5 mL

## 2016-11-01 MED ORDER — PROMETHAZINE HCL 25 MG/ML IJ SOLN
INTRAMUSCULAR | Status: AC
  Filled 2016-11-01: qty 1

## 2016-11-01 MED ORDER — ROCURONIUM BROMIDE 100 MG/10ML IV SOLN
INTRAVENOUS | Status: DC | PRN
  Administered 2016-11-01: 20 mg via INTRAVENOUS
  Administered 2016-11-01: 30 mg via INTRAVENOUS
  Administered 2016-11-01: 20 mg via INTRAVENOUS
  Administered 2016-11-01: 30 mg via INTRAVENOUS
  Administered 2016-11-01: 20 mg via INTRAVENOUS

## 2016-11-01 MED ORDER — PHENYLEPHRINE HCL 10 MG/ML IJ SOLN
INTRAMUSCULAR | Status: DC | PRN
  Administered 2016-11-01: 100 ug via INTRAVENOUS

## 2016-11-01 MED ORDER — MORPHINE SULFATE (PF) 4 MG/ML IV SOLN
4.0000 mg | Freq: Once | INTRAVENOUS | Status: AC
  Administered 2016-11-01: 4 mg via INTRAVENOUS

## 2016-11-01 MED ORDER — ONDANSETRON HCL 4 MG/2ML IJ SOLN
INTRAMUSCULAR | Status: DC | PRN
  Administered 2016-11-01: 4 mg via INTRAVENOUS

## 2016-11-01 MED ORDER — FENTANYL CITRATE (PF) 250 MCG/5ML IJ SOLN
INTRAMUSCULAR | Status: AC
  Filled 2016-11-01: qty 5

## 2016-11-01 MED ORDER — LORAZEPAM 2 MG/ML IJ SOLN
0.5000 mg | Freq: Four times a day (QID) | INTRAMUSCULAR | Status: DC | PRN
  Administered 2016-11-01: 0.5 mg via INTRAVENOUS
  Filled 2016-11-01: qty 1

## 2016-11-01 MED ORDER — PROMETHAZINE HCL 25 MG/ML IJ SOLN
6.2500 mg | Freq: Once | INTRAMUSCULAR | Status: AC
  Administered 2016-11-01: 6.25 mg via INTRAVENOUS

## 2016-11-01 MED ORDER — LIDOCAINE HCL (CARDIAC) 20 MG/ML IV SOLN
INTRAVENOUS | Status: DC | PRN
  Administered 2016-11-01: 50 mg via INTRAVENOUS

## 2016-11-01 MED ORDER — SUGAMMADEX SODIUM 200 MG/2ML IV SOLN
INTRAVENOUS | Status: AC
  Filled 2016-11-01: qty 2

## 2016-11-01 SURGICAL SUPPLY — 64 items
ADHESIVE MASTISOL STRL (MISCELLANEOUS) ×3 IMPLANT
BLADE SURG SZ10 CARB STEEL (BLADE) ×3 IMPLANT
CANISTER SUCT 1200ML W/VALVE (MISCELLANEOUS) ×3 IMPLANT
CATH TRAY 16F METER LATEX (MISCELLANEOUS) ×3 IMPLANT
CHLORAPREP W/TINT 26ML (MISCELLANEOUS) ×3 IMPLANT
CLIP TI LARGE 6 (CLIP) IMPLANT
CLIP TI MEDIUM 6 (CLIP) IMPLANT
DEFOGGER SCOPE WARMER CLEARIFY (MISCELLANEOUS) ×3 IMPLANT
DRAPE LEGGINS SURG 28X43 STRL (DRAPES) ×3 IMPLANT
DRAPE UNDER BUTTOCK W/FLU (DRAPES) ×3 IMPLANT
DRSG OPSITE POSTOP 3X4 (GAUZE/BANDAGES/DRESSINGS) ×9 IMPLANT
DRSG OPSITE POSTOP 4X8 (GAUZE/BANDAGES/DRESSINGS) ×3 IMPLANT
ELECT BLADE 6.5 EXT (BLADE) ×3 IMPLANT
ELECT CAUTERY BLADE 6.4 (BLADE) ×3 IMPLANT
ELECT REM PT RETURN 9FT ADLT (ELECTROSURGICAL) ×3
ELECTRODE REM PT RTRN 9FT ADLT (ELECTROSURGICAL) ×2 IMPLANT
GAUZE SPONGE 4X4 12PLY STRL (GAUZE/BANDAGES/DRESSINGS) ×3 IMPLANT
GLOVE BIO SURGEON STRL SZ7.5 (GLOVE) ×12 IMPLANT
GLOVE INDICATOR 8.0 STRL GRN (GLOVE) ×6 IMPLANT
GOWN STRL REUS W/ TWL LRG LVL3 (GOWN DISPOSABLE) ×12 IMPLANT
GOWN STRL REUS W/TWL LRG LVL3 (GOWN DISPOSABLE) ×6
HANDLE YANKAUER SUCT BULB TIP (MISCELLANEOUS) ×3 IMPLANT
IRRIGATION STRYKERFLOW (MISCELLANEOUS) ×2 IMPLANT
IRRIGATOR STRYKERFLOW (MISCELLANEOUS) ×3
IV NS 1000ML (IV SOLUTION) ×1
IV NS 1000ML BAXH (IV SOLUTION) ×2 IMPLANT
KIT RM TURNOVER STRD PROC AR (KITS) ×3 IMPLANT
LABEL OR SOLS (LABEL) ×3 IMPLANT
LIGASURE BLUNT 5MM 37CM (INSTRUMENTS) ×3 IMPLANT
NEEDLE HYPO 25X1 1.5 SAFETY (NEEDLE) ×3 IMPLANT
NEEDLE VERESS 14GA 120MM (NEEDLE) IMPLANT
NS IRRIG 1000ML POUR BTL (IV SOLUTION) ×3 IMPLANT
PACK COLON CLEAN CLOSURE (MISCELLANEOUS) ×3 IMPLANT
PACK LAP CHOLECYSTECTOMY (MISCELLANEOUS) ×3 IMPLANT
PAD PREP 24X41 OB/GYN DISP (PERSONAL CARE ITEMS) IMPLANT
PENCIL ELECTRO HAND CTR (MISCELLANEOUS) ×3 IMPLANT
RELOAD STAPLER BLUE 60MM (STAPLE) ×4 IMPLANT
RETRACTOR WOUND ALXS 18CM MED (MISCELLANEOUS) ×2 IMPLANT
RTRCTR WOUND ALEXIS O 18CM MED (MISCELLANEOUS) ×3
SCISSORS METZENBAUM CVD 33 (INSTRUMENTS) ×3 IMPLANT
SLEEVE ENDOPATH XCEL 5M (ENDOMECHANICALS) ×6 IMPLANT
SOL PREP PVP 2OZ (MISCELLANEOUS) ×3
SOLUTION PREP PVP 2OZ (MISCELLANEOUS) ×2 IMPLANT
SPONGE LAP 18X18 5 PK (GAUZE/BANDAGES/DRESSINGS) ×3 IMPLANT
STAPLER ECHELON LONG 60 440 (INSTRUMENTS) ×3 IMPLANT
STAPLER ENDO ILS CVD 18 33 (STAPLE) ×3 IMPLANT
STAPLER RELOAD BLUE 60MM (STAPLE) ×6
STAPLER SKIN PROX 35W (STAPLE) ×3 IMPLANT
STRIP CLOSURE SKIN 1/2X4 (GAUZE/BANDAGES/DRESSINGS) ×3 IMPLANT
SURGILUBE 2OZ TUBE FLIPTOP (MISCELLANEOUS) IMPLANT
SUT MNCRL 4-0 (SUTURE) ×3
SUT MNCRL 4-0 27XMFL (SUTURE) ×6
SUT PDS AB 1 CT1 27 (SUTURE) ×3 IMPLANT
SUT PROLENE 3 0 SH DA (SUTURE) ×3 IMPLANT
SUT SILK 3-0 (SUTURE) ×1
SUT SILK 3-0 SH-1 18XCR BRD (SUTURE) ×2
SUT VIC AB 2-0 CT1 27 (SUTURE) ×2
SUT VIC AB 2-0 CT1 TAPERPNT 27 (SUTURE) ×4 IMPLANT
SUTURE MNCRL 4-0 27XMF (SUTURE) ×6 IMPLANT
SUTURE SILK 3-0 SH-1 18XCR BRD (SUTURE) ×2 IMPLANT
SYRINGE IRR TOOMEY STRL 70CC (SYRINGE) ×3 IMPLANT
TROCAR XCEL 12X100 BLDLESS (ENDOMECHANICALS) ×3 IMPLANT
TROCAR XCEL NON-BLD 5MMX100MML (ENDOMECHANICALS) ×3 IMPLANT
TUBING INSUF HEATED (TUBING) ×3 IMPLANT

## 2016-11-01 NOTE — Progress Notes (Signed)
Initial Nutrition Assessment  DOCUMENTATION CODES:   Severe malnutrition in context of acute illness/injury  INTERVENTION:  When diet able to be advanced following surgery, recommend Ensure Enlive po TID, each supplement provides 350 kcal and 20 grams of protein.   Patient would benefit from education following surgery on nutrition, diet advancement, and calorie/protein needs (and education on nutrition for colostomy if one is created).  NUTRITION DIAGNOSIS:   Malnutrition (Severe) related to acute illness (diverticulitis, C Diff, diarrhea) as evidenced by energy intake < or equal to 50% for > or equal to 5 days, 8.6 percent weight loss over 6 weeks.  GOAL:   Patient will meet greater than or equal to 90% of their needs  MONITOR:   PO intake, Supplement acceptance, Diet advancement, Labs, Weight trends, I & O's  REASON FOR ASSESSMENT:   Malnutrition Screening Tool    ASSESSMENT:   51 year old female with PMHx of diverticulitis on antibiotics for 6 weeks without symptomatic relief. She is now having copious amounts of diarrhea (found to be positive for C Diff on 10/23/2016).   -CT Abd/Pelvis with Contrast from 7/26 found acute sigmoid diverticulitis; no evidence of abscess or perforation.  -Per chart plan is for laparoscopic partial colectomy with [place] procedure today. Possible colostomy.  Spoke with patient at bedside. She is known to this RD from previous admission. When she was seen approximately 2 weeks ago she had experienced 4 weeks of only being able to tolerate water, and bites of potatoes and rice. She was started on Boost Breeze to help meet calorie/protein needs and we discussed nutrition recommendations during diverticulitis (low-fiber). Patient reports she has started to eat better at home. She is able to take in other foods besides potatoes and rice now, but is still eating low-fiber foods. She did not purchase any oral nutrition supplements to drink at home because  she did not end up liking the Boost Breeze. She reports that everything she eats "goes right through her" as she is having diarrhea after every meal. Reports she is no longer experiencing nausea or abdominal pain. Patient is amenable to trying Ensure Enlive when diet able to be advanced since she did not like Boost Breeze last admission.  UBW was 150 lbs. She has continued to lose weight since last assessment. She has now lost 12.9 lbs (8.6% body weight) over 6 weeks, which is significant for time frame.  Medications reviewed and include: pantoprazole, vancomycin, D5-LR @ 125 ml/hr (3 L, 150 grams of dextrose, 510 kcal daily), Flagyl, Zosyn.  Labs reviewed: Platelets 610.  Nutrition-Focused physical exam completed. Findings are mild-moderate fat depletion in upper arm region, mild-moderate muscle depletion in posterior calf region, and no edema. Exam appears unchanged since last assessment.  Diet Order:  Diet NPO time specified  Skin:  Reviewed, no issues  Last BM:  10/31/2016  Height:   Ht Readings from Last 1 Encounters:  10/31/16 5\' 5"  (1.651 m)    Weight:   Wt Readings from Last 1 Encounters:  10/31/16 137 lb 2 oz (62.2 kg)    Ideal Body Weight:  56.8 kg  BMI:  Body mass index is 22.82 kg/m.  Estimated Nutritional Needs:   Kcal:  1017-5102 (MSJ x 1.3-1.5)  Protein:  75-87 grams (1.2-1.4 grams/kg)  Fluid:  1.8-2.2 L/day (30-35 ml/kg)  EDUCATION NEEDS:   No education needs identified at this time  Willey Blade, MS, RD, LDN Pager: [phone] After Hours Pager: [phone]

## 2016-11-01 NOTE — Op Note (Signed)
Pre-operative Diagnosis:Sigmoid Diverticulitis  Post-operative Diagnosis: Same  Procedure Performed: Laparoscopic sigmoid colectomy, mobilization of splenic flexure.  Surgeon: Clayburn Pert   Co-Surgeon: Dr. Hampton Abbot  Anesthesia: General endotracheal anesthesia  ASA Class: 2  Surgeon: Clayburn Pert, MD FACS  Anesthesia: Gen. with endotracheal tube  Assistant:None  Procedure Details  The patient was seen again in the Holding Room. The benefits, complications, treatment options, and expected outcomes were discussed with the patient. The risks of bleeding, infection, recurrence of symptoms, failure to resolve symptoms,  bowel injury, any of which could require further surgery were reviewed with the patient.   The patient was taken to Operating Room, identified as Morgan Cisneros and the procedure verified.  A Time Out was held and the above information confirmed.  Prior to the induction of general anesthesia, antibiotic prophylaxis was administered. VTE prophylaxis was in place. General endotracheal anesthesia was then administered and tolerated well. After the induction, the abdomen was prepped with Chloraprep and draped in the sterile fashion, the perineum was prepped with Betadine. The patient was positioned in the low lithotomy position.  The procedure began with a right upper quadrant Veress needle approach. An area 2 fingers below the costal margin in the midclavicular line was chosen and localized with 1% lidocaine 0.5% Marcaine plain solution. The skin was incised with a scalpel and using the Veress needle a pneumoperitoneum was established at 15 mmHg. The abdomen was then entered into with the Optiview trocar without difficulty. All of the surrounding structures were visualized without any evidence of damage from either the needle or the Optiview trocar. An infraumbilical 5 mm trocar, a right lower quadrant 12 mm trocar, a low midline 5 mm trocar was then placed under direct  visualization all after localization with the aforementioned local anesthetic. The patient was then placed in the steep head down and rotated to the right position which allowed the operative field be visualized.  The sigmoid colon was noted to have dense inflammatory adhesions to the anterior abdominal wall hemorrhage taken down using accommodation of sharp dissection and electric dissection with the LigaSure device. The dissection was meticulous and took approximately 1 hour. After this the attachments were fully taken down and the length of disease was able to visualize. The distal colon was noted to be completely normal and free of disease. At this point we proceeded to take down the mesentery along the colon wall at the area of inflammation. This was using the LigaSure device for the entirety of. Care was taken to stay along the colon proper. This also took approximately one hour was able to free up the mesentery of the entirety of the diseased colon down to healthy colon distal to it.  With the distal colon skeletonized a 60 mm load powered GIA stapler was used to ligate the distal colon at an area of healthy colon above the peritoneal reflection. At which point the mobilization of the proximal colon was tested and was determined that it was not adequately mobilized for anastomosis. The entire white line of Toldt was mobilized and we again tested are mobility and determined that additional length was also desired. At this point our camera was moved for a low midline port and the splenic flexure was taken down using the LigaSure device. With the mobilization of the splenic flexure it was determined that adequate mobilization was obtained.  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 An approximate 6 cm incision was  made sharply and then with electrocautery down to the level of fascia. Wound protector was placed and the dissected out sigmoid colon was able  to be delivered with these. A proximal area of healthy colon was identified and ligated with the aforementioned stapler. An area was noted to be bleeding which required suture ligation to control. We then skeletonized the healthy colon and sharply opened it with Metzenbaum scissors. Using EEA sizers it was found to accept a 33 mm EEA.  The appropriate size stapler was opened and a pursestring suture was created using a 2-0 Prolene suture. The anvil was placed into the open colon and secured with the pursestring suture. It was then returned to the abdomen and the pneumoperitoneum was reestablished using the wound protector to obtain a seal. My assisting surgeon then maintained the laparoscopic portion and I proceeded to the transrectal portion.  The rectum was identified and digitally dilated to 2 finger rest. Then using the EEA sizers the rectum was noted to easily accept a 33 sizer to the staple line. The stapler was then placed in the rectum and visualized going up near the staple line from the previous staple fire. The spike was extended and able to be easily married to the anvil that had previously been placed. The stapler was closed and the colon was visualized to be of the appropriate orientation without any additional tissue within the stapler. It was closed to the appropriate level of the stapler and fired. 2 full doughnuts were able be retrieved without difficulty. The staple line was then able to be submerged in water from a suction irrigator and using a large Toomey syringe air was insufflated into the rectum which passed the staple line without any evidence of air leak.  The pneumoperitoneum was released. The patient was returned to a neutral position and all of the laparoscopic ports were removed under direct visualization. We then proceeded to do a clean closure. All members of the operative team broke scrub and returned after re-scrubbing. The field was redraped and all sterile equipment was  used. The low midline incision fascia was closed with a running #1 PDS suture. With the fascia closed at the midline and the right lower quadrant fascia was closed with a 2-0 Vicryl suture. The skin was closed at all sites with surgical staples. Honeycomb dressings were then placed over every operative site. The patient was returned to a supine position where the Foley catheter and OG tube was removed prior to awakening from general anesthesia. The patient tolerated the procedure well. There were no weak, complications. All counts are correct at the end of the procedure before and after the clean closure. She was awoken from general anesthesia and transferred to the PACU in good condition.  Findings: Extensive sigmoid diverticulitis   Estimated Blood Loss: 100 mL         Drains: None         Specimens: Sigmoid colon          Complications: None                  Condition: Good   Clayburn Pert, MD, FACS

## 2016-11-01 NOTE — Anesthesia Procedure Notes (Signed)
Procedure Name: Intubation Date/Time: 11/01/2016 3:40 PM Performed by: Jonna Clark Pre-anesthesia Checklist: Patient identified, Patient being monitored, Timeout performed, Emergency Drugs available and Suction available Patient Re-evaluated:Patient Re-evaluated prior to induction Oxygen Delivery Method: Circle system utilized Preoxygenation: Pre-oxygenation with 100% oxygen Induction Type: IV induction Ventilation: Mask ventilation without difficulty Laryngoscope Size: 3 and McGraph Grade View: Grade I Tube type: Oral Tube size: 7.0 mm Number of attempts: 1 Airway Equipment and Method: Stylet Placement Confirmation: ETT inserted through vocal cords under direct vision,  positive ETCO2 and breath sounds checked- equal and bilateral Secured at: 21 cm Tube secured with: Tape Dental Injury: Teeth and Oropharynx as per pre-operative assessment

## 2016-11-01 NOTE — H&P (Signed)
Morgan Cisneros is an 51 y.o. female.       Chief Complaint  Patient presents with  . Follow-up    Diverticulitis and STAT Abdominal CT (per Dr. Dahlia Byes)    Please see note from Dr. Burt Knack for applicable updates.  HPI: 51 year old female returns to clinic for follow-up for her personal history of diverticulitis. She was seen yesterday in clinic and sent for labs and CT scan. The CT scan shows continued diverticulitis without improvement. Patient continues to be tachycardic and have a general feeling of malaise. She has been on antibiotics now for 6 weeks without any symptomatic relief. Patient is very emotional and tired of feeling ill. Her pain continues to be in the left lower quadrant and does not radiate. It is worse with activity or palpation. It is improved with oral pain medications. She states she is eating well but having copious amounts of diarrhea. She denies any current fevers, chills, chest pain, shortness of breath, nausea, vomiting.      Past Medical History:  Diagnosis Date  . Abdominal pain   . C. difficile colitis 10/2016  . Diverticulitis 10/2016  . Flank pain   . Gross hematuria   . History of kidney stones   . Insomnia   . Ovarian cyst   . Renal calculus, right   . Thoracic back pain    Right sided         Past Surgical History:  Procedure Laterality Date  . ABDOMINAL HYSTERECTOMY  08/2013  . ABDOMINAL HYSTERECTOMY    . BREAST BIOPSY Left 08/10/2016   [place] core  . CYSTOSCOPY  11/26/2010   with stent placement  . HEMORRHOID SURGERY    . kidney stent    . LITHOTRIPSY     X 4  . TONSILLECTOMY    . TUBAL LIGATION    . Ureteroscopy Right 11/21/1997   with holmium laser         Family History  Problem Relation Age of Onset  . Kidney Stones Mother   . Ovarian cancer Mother   . Colon cancer Mother   . Lung cancer Father   . Hyperlipidemia Sister   . Hypertension Sister   . Alzheimer's disease Maternal  Grandmother   . Cancer Maternal Grandfather        Liver  . Alzheimer's disease Paternal Grandfather   . Hypertension Sister   . Hyperlipidemia Sister   . Breast cancer Neg Hx     Social History:  reports that she has been smoking Cigarettes.  She has been smoking about 0.25 packs per day. She has never used smokeless tobacco. She reports that she drinks alcohol. She reports that she does not use drugs.  Allergies:      Allergies  Allergen Reactions  . Urocit-K [Potassium Citrate] Nausea Only    Medications reviewed.    ROS A multipoint review of systems was completed, all pertinent positives and negatives are documented within the history of present illness and remainder are negative.   BP 115/78   Pulse 100   Temp 98.7 F (37.1 C) (Oral)   Ht _0  (1.651 m)   Wt 62.1 kg (137 lb)   BMI 22.80 kg/m   Physical Exam Gen.: No acute distress Neck: Supple and nontender Chest: Clear to auscultation  heart: Tachycardic Abdomen: Soft, tender to palpation in the left lower quadrant but without rebound. Extremities: Moves all extremities well.    Lab Results Last 48 Hours  Results for orders placed or performed during the hospital encounter of 10/28/16 (from the past 48 hour(s))  CBC with Differential/Platelet     Status: Abnormal   Collection Time: 10/28/16  3:47 PM  Result Value Ref Range   WBC 9.8 3.6 - 11.0 K/uL   RBC 4.11 3.80 - 5.20 MIL/uL   Hemoglobin 13.2 12.0 - 16.0 g/dL   HCT 38.6 35.0 - 47.0 %   MCV 93.9 80.0 - 100.0 fL   MCH 32.1 26.0 - 34.0 pg   MCHC 34.2 32.0 - 36.0 g/dL   RDW 13.9 11.5 - 14.5 %   Platelets 607 (H) 150 - 440 K/uL   Neutrophils Relative % 80 %   Neutro Abs 7.7 (H) 1.4 - 6.5 K/uL   Lymphocytes Relative 14 %   Lymphs Abs 1.4 1.0 - 3.6 K/uL   Monocytes Relative 4 %   Monocytes Absolute 0.4 0.2 - 0.9 K/uL   Eosinophils Relative 2 %   Eosinophils Absolute 0.2 0 - 0.7 K/uL   Basophils  Relative 0 %   Basophils Absolute 0.0 0 - 0.1 K/uL  Comprehensive metabolic panel     Status: None   Collection Time: 10/28/16  3:47 PM  Result Value Ref Range   Sodium 140 135 - 145 mmol/L   Potassium 3.9 3.5 - 5.1 mmol/L   Chloride 102 101 - 111 mmol/L   CO2 26 22 - 32 mmol/L   Glucose, Bld 89 65 - 99 mg/dL   BUN 9 6 - 20 mg/dL   Creatinine, Ser 0.65 0.44 - 1.00 mg/dL   Calcium 9.3 8.9 - 10.3 mg/dL   Total Protein 7.3 6.5 - 8.1 g/dL   Albumin 3.5 3.5 - 5.0 g/dL   AST 17 15 - 41 U/L   ALT 15 14 - 54 U/L   Alkaline Phosphatase 111 38 - 126 U/L   Total Bilirubin 0.6 0.3 - 1.2 mg/dL   GFR calc non Af Amer >60 >60 mL/min   GFR calc Af Amer >60 >60 mL/min    Comment: (NOTE) The eGFR has been calculated using the CKD EPI equation. This calculation has not been validated in all clinical situations. eGFR's persistently <60 mL/min signify possible Chronic Kidney Disease.    Anion gap 12 5 - 15      Imaging Results (Last 48 hours)  Ct Abdomen Pelvis W Contrast  Result Date: 10/28/2016 CLINICAL DATA:  51 year old female with a history of left lower quadrant abdominal pain EXAM: CT ABDOMEN AND PELVIS WITH CONTRAST TECHNIQUE: Multidetector CT imaging of the abdomen and pelvis was performed using the standard protocol following bolus administration of intravenous contrast. CONTRAST:  158m ISOVUE-300 IOPAMIDOL (ISOVUE-300) INJECTION 61% COMPARISON:  10/22/2016, 02/01/2012 FINDINGS: Lower chest: Mild pericardial fluid/ thickening. Hepatobiliary: Multiple low-density rounded cystic structures of liver parenchyma. Number and size relatively unchanged dating to the CT 02/01/2012. Unremarkable appearance of gallbladder. Pancreas: Unremarkable pancreas Spleen: Unremarkable spleen Adrenals/Urinary Tract: Unremarkable bilateral adrenal glands. Nonobstructive right-sided nephrolithiasis, with several small stones. No hydronephrosis. Unremarkable course of the right ureter. 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 No hydronephrosis. Single tiny stone at the lower pole collecting system. Unremarkable course of the left ureter. Stomach/Bowel: Inflammatory changes involving the sigmoid colon and the adjacent mesenteric fat, unchanged in distribution from the comparison CT. While there is free fluid within the peritoneal leaves, there is no evidence of abscess. No extraluminal gas. Surgical suture line at the rectum. Normal appendix. No abnormally distended small bowel to  indicate obstruction. Vascular/Lymphatic: No aneurysm. Minimal calcifications of the abdominal aorta. Proximal femoral vasculature patent. Mesenteric vasculature patent. Bilateral renal arteries patent. Reproductive: Hysterectomy Other: No abdominal hernia. Musculoskeletal: No acute fracture. No significant degenerative changes. IMPRESSION: CT again demonstrates acute sigmoid diverticulitis, with adjacent inflammatory changes and no evidence of abscess or perforation. Surgical suture line at the rectum. Recommend correlation with patient surgical history. Bilateral nonobstructive nephrolithiasis. Aortic Atherosclerosis (ICD10-I70.0). Electronically Signed   By: Corrie Mckusick D.O.   On: 10/28/2016 17:44     Assessment/Plan:  1. Diverticulitis of large intestine without perforation or abscess without bleeding 51 year old female with continued sigmoid diverticulitis. Given her clinical picture I offered her admission from clinic today. Upon which the patient became very emotional and she asked if she could come in on Sunday as she has the funeral of her father-in-law to attend to tomorrow. Discussed at length that the patient cannot maintain hydration she needs to be in the hospital. She continues to request delaying admission until Sunday. She states she will attempt her best a forced liquids to maintain hydration. Discussed the planned surgery that she requires of a sigmoid colectomy with likely end colostomy  versus loop ileostomy. The risks, benefits, alternatives of the surgery were discussed and she voiced understanding. She desires to proceed with me on Monday. Discussed that although I'm working the night shift Monday night that I would come in early to perform the surgery with my partner, Dr.Piscoya. Also discussed that upon presentation on Sunday should she be significantly worse she would likely be offered surgery by our senior partner, Dr. Burt Knack. Dr. Burt Knack will be here when she comes into the hospital on Sunday for admission. Patient voiced understanding and agrees to this plan.  A total of 25 minutes was used on this encounter greater than 50% of it user counseling and coordination of care.   Clayburn Pert, MD [facility] General Surgeon

## 2016-11-01 NOTE — Progress Notes (Signed)
Called Dr. Adonis Huguenin regarding pain/nausea medication.  Appropriate orders were placed.  Morgan Cisneros  11/01/2016  11:09 PM

## 2016-11-01 NOTE — Transfer of Care (Signed)
Immediate Anesthesia Transfer of Care Note  Patient: [name]  Procedure(s) Performed: Procedure(s): LAPAROSCOPIC SIGMOID COLECTOMY (N/A)  Patient Location: PACU  Anesthesia Type:General  Level of Consciousness: awake, alert  and patient cooperative  Airway & Oxygen Therapy: Patient Spontanous Breathing  Post-op Assessment: Report given to RN and Post -op Vital signs reviewed and stable  Post vital signs: Reviewed and stable  Last Vitals:  Vitals:   11/01/16 1445 11/01/16 1906  BP: (!) 146/99 (!) 131/109  Pulse: 78 93  Resp: 18 17  Temp: 37.1 C 36.6 C    Last Pain:  Vitals:   11/01/16 1445  TempSrc: Tympanic  PainSc: 10-Worst pain ever         Complications: No apparent anesthesia complications

## 2016-11-01 NOTE — Brief Op Note (Signed)
10/31/2016 - 11/01/2016  7:01 PM  PATIENT:  Morgan Cisneros  51 y.o. female  PRE-OPERATIVE DIAGNOSIS:  diverticulitis  POST-OPERATIVE DIAGNOSIS:  diverticulitis  PROCEDURE:  Procedure(s): LAPAROSCOPIC SIGMOID COLECTOMY (N/A)  SURGEON:  Surgeon(s) and Role:    * Clayburn Pert, MD - Primary    * Olean Ree, MD - Assisting  PHYSICIAN ASSISTANT:   ASSISTANTS: none   ANESTHESIA:   general  EBL:  No intake/output data recorded.  BLOOD ADMINISTERED:none  DRAINS: none   LOCAL MEDICATIONS USED:  MARCAINE    and XYLOCAINE   SPECIMEN:  Source of Specimen:  sigmoid colon  DISPOSITION OF SPECIMEN:  PATHOLOGY  COUNTS:  YES  TOURNIQUET:  * No tourniquets in log *  DICTATION: .Dragon Dictation  PLAN OF CARE: return to inpatient status  PATIENT DISPOSITION:  PACU - hemodynamically stable.   Delay start of Pharmacological VTE agent (>24hrs) due to surgical blood loss or risk of bleeding: no

## 2016-11-01 NOTE — Anesthesia Postprocedure Evaluation (Signed)
Anesthesia Post Note  Patient: Che Below  Procedure(s) Performed: Procedure(s) (LRB): LAPAROSCOPIC SIGMOID COLECTOMY (N/A)  Patient location during evaluation: PACU Anesthesia Type: General Level of consciousness: awake and alert Pain management: pain level controlled Vital Signs Assessment: post-procedure vital signs reviewed and stable Respiratory status: spontaneous breathing, nonlabored ventilation, respiratory function stable and patient connected to nasal cannula oxygen Cardiovascular status: blood pressure returned to baseline and stable Postop Assessment: no signs of nausea or vomiting Anesthetic complications: no     Last Vitals:  Vitals:   11/01/16 2023 11/01/16 2103  BP: 135/90 128/84  Pulse: 95 97  Resp: 16 18  Temp: 36.8 C 36.9 C    Last Pain:  Vitals:   11/01/16 2103  TempSrc: Oral  PainSc:                  Martha Clan

## 2016-11-01 NOTE — Anesthesia Preprocedure Evaluation (Addendum)
Anesthesia Evaluation  Patient identified by MRN, date of birth, ID band Patient awake    Reviewed: Allergy & Precautions, H&P , NPO status , Patient's Chart, lab work & pertinent test results, reviewed documented beta blocker date and time   History of Anesthesia Complications (+) Emergence Delirium  Airway Mallampati: III  TM Distance: <3 FB Neck ROM: full    Dental  (+) Teeth Intact, Dental Advidsory Given   Pulmonary neg shortness of breath, neg sleep apnea, neg COPD, neg recent URI, Current Smoker,           Cardiovascular Exercise Tolerance: Good negative cardio ROS       Neuro/Psych negative neurological ROS  negative psych ROS   GI/Hepatic Neg liver ROS, GERD  Medicated,  Endo/Other  negative endocrine ROS  Renal/GU Renal disease  negative genitourinary   Musculoskeletal   Abdominal   Peds  Hematology negative hematology ROS (+)   Anesthesia Other Findings Past Medical History: No date: Abdominal pain 10/2016: C. difficile colitis 10/2016: Diverticulitis No date: Flank pain No date: Gross hematuria No date: History of kidney stones No date: Insomnia No date: Ovarian cyst No date: Renal calculus, right No date: Thoracic back pain     Comment:  Right sided Past Surgical History: 08/2013: ABDOMINAL HYSTERECTOMY No date: ABDOMINAL HYSTERECTOMY 08/10/2016: BREAST BIOPSY; Left     Comment:  [place] core 11/26/2010: CYSTOSCOPY     Comment:  with stent placement No date: HEMORRHOID SURGERY No date: kidney stent No date: LITHOTRIPSY     Comment:  X 4 No date: TONSILLECTOMY No date: TUBAL LIGATION 11/21/1997: Ureteroscopy; Right     Comment:  with holmium laser BMI    Body Mass Index:  22.80 kg/m     Reproductive/Obstetrics negative OB ROS                            Anesthesia Physical Anesthesia Plan  ASA: III  Anesthesia Plan: General   Post-op Pain Management:     Induction: Intravenous  PONV Risk Score and Plan: 4 or greater and Ondansetron, Dexamethasone and Midazolam  Airway Management Planned: Oral ETT  Additional Equipment:   Intra-op Plan:   Post-operative Plan: Extubation in OR  Informed Consent: I have reviewed the patients History and Physical, chart, labs and discussed the procedure including the risks, benefits and alternatives for the proposed anesthesia with the patient or authorized representative who has indicated his/her understanding and acceptance.   Dental Advisory Given  Plan Discussed with: CRNA  Anesthesia Plan Comments:        Anesthesia Quick Evaluation

## 2016-11-01 NOTE — Anesthesia Post-op Follow-up Note (Cosign Needed)
Anesthesia QCDR form completed.        

## 2016-11-02 ENCOUNTER — Encounter: Payer: Self-pay | Admitting: General Surgery

## 2016-11-02 LAB — BASIC METABOLIC PANEL
ANION GAP: 8 (ref 5–15)
BUN: <5 mg/dL — ABNORMAL LOW (ref 6–20)
CHLORIDE: 106 mmol/L (ref 101–111)
CO2: 28 mmol/L (ref 22–32)
Calcium: 8.8 mg/dL — ABNORMAL LOW (ref 8.9–10.3)
Creatinine, Ser: 0.67 mg/dL (ref 0.44–1.00)
GFR calc non Af Amer: >60 mL/min (ref >60)
GLUCOSE: 164 mg/dL — AB (ref 65–99)
POTASSIUM: 3.9 mmol/L (ref 3.5–5.1)
Sodium: 142 mmol/L (ref 135–145)

## 2016-11-02 LAB — CBC
HEMATOCRIT: 36.3 % (ref 35.0–47.0)
HEMOGLOBIN: 12.1 g/dL (ref 12.0–16.0)
MCH: 31.3 pg (ref 26.0–34.0)
MCHC: 33.5 g/dL (ref 32.0–36.0)
MCV: 93.3 fL (ref 80.0–100.0)
Platelets: 581 10*3/uL — ABNORMAL HIGH (ref 150–440)
RBC: 3.89 MIL/uL (ref 3.80–5.20)
RDW: 13.8 % (ref 11.5–14.5)
WBC: 13.1 10*3/uL — AB (ref 3.6–11.0)

## 2016-11-02 NOTE — Progress Notes (Signed)
11/02/2016  Subjective: Patient is 1 Day Post-Op.  Status post laparoscopic sigmoid colon colectomy with mobilization of the splenic flexure. No acute events overnight. Patient reports that her pain is well-controlled. She did have small bloody bowel movement but it was a 1 time only.  Vital signs: Temp:  [97.5 F (36.4 C)-98.8 F (37.1 C)] 98.7 F (37.1 C) (07/31 0826) Pulse Rate:  [78-97] 91 (07/31 0826) Resp:  [13-20] 20 (07/31 0826) BP: (128-146)/(84-109) 140/95 (07/31 0826) SpO2:  [92 %-100 %] 95 % (07/31 0826) Weight:  [62.1 kg (137 lb)] 62.1 kg (137 lb) (07/30 1445)   Intake/Output: 07/30 0701 - 07/31 0700 In: 2400 [I.V.:2400] Out: 350 [Urine:250; Blood:100] Last BM Date: 11/01/16  Physical Exam: Constitutional: No acute distress Abdomen: Soft, nondistended, properly tender to palpation. All incisions are clean dry and intact with staples in place and honeycomb dressings.  Labs:   Recent Labs  10/31/16 1403 11/02/16 0416  WBC 5.8 13.1*  HGB 13.6 12.1  HCT 39.1 36.3  PLT 610* 581*    Recent Labs  10/31/16 1403 11/02/16 0416  NA 143 142  K 4.1 3.9  CL 105 106  CO2 27 28  GLUCOSE 94 164*  BUN 6 <5*  CREATININE 0.69 0.67  CALCIUM 9.5 8.8*    Recent Labs  10/31/16 1403  LABPROT 15.1  INR 1.18    Imaging: No results found.  Assessment/Plan: 51 year old female status post laparoscopic sigmoid colectomy.  -We'll start clear liquids today her -Continue IV fluids until appropriate by mouth intake. -We'll continue IV Flagyl for treatment of her C. difficile colitis. No further treatment needed for diverticulitis. As the patient tolerates by mouth, the plan will be to transition her to oral vancomycin for discharge to home. -out of bed and ambulate.   Melvyn Neth, [place]

## 2016-11-02 NOTE — Plan of Care (Signed)
Problem: Nutrition: Goal: Adequate nutrition will be maintained Outcome: Not Progressing Patient is NPO except ice chips.   

## 2016-11-03 LAB — BASIC METABOLIC PANEL
Anion gap: 8 (ref 5–15)
BUN: <5 mg/dL — ABNORMAL LOW (ref 6–20)
CHLORIDE: 106 mmol/L (ref 101–111)
CO2: 29 mmol/L (ref 22–32)
CREATININE: 0.69 mg/dL (ref 0.44–1.00)
Calcium: 8.6 mg/dL — ABNORMAL LOW (ref 8.9–10.3)
Glucose, Bld: 108 mg/dL — ABNORMAL HIGH (ref 65–99)
POTASSIUM: 3 mmol/L — AB (ref 3.5–5.1)
SODIUM: 143 mmol/L (ref 135–145)

## 2016-11-03 LAB — CBC
HCT: 33.1 % — ABNORMAL LOW (ref 35.0–47.0)
HEMOGLOBIN: 11.5 g/dL — AB (ref 12.0–16.0)
MCH: 32.6 pg (ref 26.0–34.0)
MCHC: 34.8 g/dL (ref 32.0–36.0)
MCV: 93.8 fL (ref 80.0–100.0)
Platelets: 516 10*3/uL — ABNORMAL HIGH (ref 150–440)
RBC: 3.53 MIL/uL — AB (ref 3.80–5.20)
RDW: 13.6 % (ref 11.5–14.5)
WBC: 7.4 10*3/uL (ref 3.6–11.0)

## 2016-11-03 LAB — SURGICAL PATHOLOGY

## 2016-11-03 MED ORDER — LORAZEPAM 0.5 MG PO TABS
0.5000 mg | ORAL_TABLET | Freq: Four times a day (QID) | ORAL | Status: DC | PRN
Start: 2016-11-03 — End: 2016-11-04

## 2016-11-03 MED ORDER — OXYCODONE HCL 5 MG PO TABS
5.0000 mg | ORAL_TABLET | ORAL | Status: DC | PRN
  Administered 2016-11-03: 10 mg via ORAL
  Administered 2016-11-04: 5 mg via ORAL
  Administered 2016-11-04: 10 mg via ORAL
  Filled 2016-11-03: qty 2
  Filled 2016-11-03: qty 1
  Filled 2016-11-03: qty 2

## 2016-11-03 MED ORDER — FAMOTIDINE 20 MG PO TABS
20.0000 mg | ORAL_TABLET | Freq: Two times a day (BID) | ORAL | Status: DC
  Administered 2016-11-03 – 2016-11-04 (×2): 20 mg via ORAL
  Filled 2016-11-03 (×2): qty 1

## 2016-11-03 MED ORDER — VANCOMYCIN 50 MG/ML ORAL SOLUTION
250.0000 mg | Freq: Four times a day (QID) | ORAL | Status: DC
  Administered 2016-11-03 – 2016-11-04 (×4): 250 mg via ORAL
  Filled 2016-11-03 (×7): qty 5

## 2016-11-03 MED ORDER — DULOXETINE HCL 20 MG PO CPEP
40.0000 mg | ORAL_CAPSULE | Freq: Every day | ORAL | Status: DC
  Administered 2016-11-04: 40 mg via ORAL
  Filled 2016-11-03: qty 2

## 2016-11-03 MED ORDER — KETOROLAC TROMETHAMINE 30 MG/ML IJ SOLN
30.0000 mg | Freq: Four times a day (QID) | INTRAMUSCULAR | Status: DC | PRN
  Administered 2016-11-04: 30 mg via INTRAVENOUS
  Filled 2016-11-03: qty 1

## 2016-11-03 MED ORDER — ENSURE ENLIVE PO LIQD
237.0000 mL | Freq: Two times a day (BID) | ORAL | Status: DC
  Administered 2016-11-03: 237 mL via ORAL

## 2016-11-03 MED ORDER — PROMETHAZINE HCL 25 MG PO TABS
25.0000 mg | ORAL_TABLET | Freq: Three times a day (TID) | ORAL | Status: DC | PRN
  Filled 2016-11-03: qty 1

## 2016-11-03 MED ORDER — MENTHOL 3 MG MT LOZG
1.0000 | LOZENGE | OROMUCOSAL | Status: DC | PRN
  Filled 2016-11-03: qty 9

## 2016-11-03 MED ORDER — MORPHINE SULFATE (PF) 2 MG/ML IV SOLN: 2.0000 mg | INTRAVENOUS | Status: DC | PRN

## 2016-11-03 MED ORDER — POTASSIUM CHLORIDE CRYS ER 20 MEQ PO TBCR
40.0000 meq | EXTENDED_RELEASE_TABLET | ORAL | Status: AC
  Administered 2016-11-03 (×2): 40 meq via ORAL
  Filled 2016-11-03 (×2): qty 2

## 2016-11-03 NOTE — Progress Notes (Signed)
11/03/2016  Subjective: Patient is 2 Days Post-Op s/p laparoscopic sigmoidectomy and splenic flexure mobilization.  No acute events overnight.  Tolerated clears well.  Had mild nausea that she attributes to the pain medication.  Has had some small bowel movement with blood in them.  Vital signs: Temp:  [97.8 F (36.6 C)-99 F (37.2 C)] 97.8 F (36.6 C) (08/01 0505) Pulse Rate:  [75-84] 75 (08/01 1211) Resp:  [16-19] 19 (08/01 0505) BP: (106-137)/(68-94) 126/81 (08/01 1211) SpO2:  [95 %-98 %] 98 % (08/01 1211)   Intake/Output: 07/31 0701 - 08/01 0700 In: 3911 [I.V.:3811; IV Piggyback:100] Out: 400 [Urine:400] Last BM Date: 12/03/16  Physical Exam: Constitutional: No acute distress Abdomen:  Soft, nondistended, appropriately tender to palpation.  Incisions are clean, dry, intact with staples in place.  Labs:   Recent Labs  11/02/16 0416 11/03/16 0412  WBC 13.1* 7.4  HGB 12.1 11.5*  HCT 36.3 33.1*  PLT 581* 516*    Recent Labs  11/02/16 0416 11/03/16 0412  NA 142 143  K 3.9 3.0*  CL 106 106  CO2 28 29  GLUCOSE 164* 108*  BUN <5* <5*  CREATININE 0.67 0.69  CALCIUM 8.8* 8.6*    Recent Labs  10/31/16 1403  LABPROT 15.1  INR 1.18    Imaging: No results found.  Assessment/Plan: 51 yo female s/p laparoscopic sigmoidectomy.  --Advance to full liquids today --Start po pain medications --D/C IV fluids --continue IV flagyl for c-diff.  Will transition to oral vancomycin tomorrow as her diet is advanced further. --Possible d/c to home tomorrow.   Melvyn Neth, [place]

## 2016-11-04 ENCOUNTER — Ambulatory Visit: Payer: BC Managed Care – PPO | Admitting: Family Medicine

## 2016-11-04 MED ORDER — OXYCODONE HCL 5 MG PO TABS: 5.0000 mg | ORAL_TABLET | ORAL | 0 refills | Status: DC | PRN

## 2016-11-04 MED ORDER — VANCOMYCIN 50 MG/ML ORAL SOLUTION: 250.0000 mg | Freq: Four times a day (QID) | ORAL | 0 refills | Status: DC

## 2016-11-04 NOTE — Discharge Instructions (Signed)
Laparoscopic Colectomy, Care After °This sheet gives you information about how to care for yourself after your procedure. Your health care provider may also give you more specific instructions. If you have problems or questions, contact your health care provider. °What can I expect after the procedure? °After your procedure, it is common to have the following: °· Pain in your abdomen, especially in the incision areas. You will be given medicine to control the pain. °· Tiredness. This is a normal part of the recovery process. Your energy level will return to normal over the next several weeks. °· Changes in your bowel movements, such as constipation or needing to go more often. Talk with your health care provider about how to manage this. ° °Follow these instructions at home: °Medicines °· Take over-the-counter and prescription medicines only as told by your health care provider. °· Do not drive or use heavy machinery while taking prescription pain medicine. °· Do not drink alcohol while taking prescription pain medicine. °· If you were prescribed an antibiotic medicine, use it as told by your health care provider. Do not stop using the antibiotic even if you start to feel better. °Incision care °· Follow instructions from your health care provider about how to take care of your incision areas. Make sure you: °? Keep your incisions clean and dry. °? Wash your hands with soap and water before and after applying medicine to the areas, and before and after changing your bandage (dressing). If soap and water are not available, use hand sanitizer. °? Change your dressing as told by your health care provider. °? Leave stitches (sutures), skin glue, or adhesive strips in place. These skin closures may need to stay in place for 2 weeks or longer. If adhesive strip edges start to loosen and curl up, you may trim the loose edges. Do not remove adhesive strips completely unless your health care provider tells you to do  that. °· Do not wear tight clothing over the incisions. Tight clothing may rub and irritate the incision areas, which may cause the incisions to open. °· Do not take baths, swim, or use a hot tub until your health care provider approves. Ask your health care provider if you can take showers. You may only be allowed to take sponge baths for bathing. °· Check your incision area every day for signs of infection. Check for: °? More redness, swelling, or pain. °? More fluid or blood. °? Warmth. °? Pus or a bad smell. °Activity °· Avoid lifting anything that is heavier than 10 lb (4.5 kg) for 2 weeks or until your health care provider says it is okay. °· You may resume normal activities as told by your health care provider. Ask your health care provider what activities are safe for you. °· Take rest breaks during the day as needed. °Eating and drinking °· Follow instructions from your health care provider about what you can eat after surgery. °· To prevent or treat constipation while you are taking prescription pain medicine, your health care provider may recommend that you: °? Drink enough fluid to keep your urine clear or pale yellow. °? Take over-the-counter or prescription medicines. °? Eat foods that are high in fiber, such as fresh fruits and vegetables, whole grains, and beans. °? Limit foods that are high in fat and processed sugars, such as fried and sweet foods. °General instructions °· Ask your health care provider when you will need an appointment to get your sutures or staples removed. °· Keep   all follow-up visits as told by your health care provider. This is important. Contact a health care provider if:  You have more redness, swelling, or pain around your incisions.  You have more fluid or blood coming from the incisions.  Your incisions feel warm to the touch.  You have pus or a bad smell coming from your incisions or your dressing.  You have a fever.  You have an incision that breaks open  (edges not staying together) after sutures or staples have been removed. Get help right away if:  You develop a rash.  You have chest pain or difficulty breathing.  You have pain or swelling in your legs.  You feel light-headed or you faint.  Your abdomen swells (becomes distended).  You have nausea or vomiting.  You have blood in your stool (feces). This information is not intended to replace advice given to you by your health care provider. Make sure you discuss any questions you have with your health care provider. Document Released: 10/09/2004 Document Revised: 12/22/2015 Document Reviewed: 12/22/2015 Elsevier Interactive Patient Education  2018 [employer].   Clostridium Difficile Infection Clostridium difficile (C. difficile or C. diff) infection is a condition that causes inflammation of the large intestine (colon). This condition can result in damage to the lining of your colon and may lead to colitis. This infection can be passed from person to person (is contagious). What are the causes? C. diff is a bacterium that is normally found in the colon. This infection is caused when the balance of C. diff is changed and there is an overgrowth of C. diff. This is often caused by antibiotic use. What increases the risk? This condition is more likely to develop in people who:  Take antibiotic medicines.  Take a certain type of medicine called proton pump inhibitors over a long period of time (chronic use).  Are older.  Have had a C. diff infection before.  Have serious underlying conditions, such as colon cancer.  Are in the hospital.  Have a weak defense (immune) system.  Live in a place where there is a lot of contact with others, such as a nursing home.  Have had gastrointestinal (GI) tract surgery.  What are the signs or symptoms? Symptoms of this condition include:  Diarrhea. This may be bloody, watery, or yellow or green in color.  Fever.  Fatigue.  Loss  of appetite.  Nausea.  Swelling, pain, or tenderness in the abdomen.  Dehydration. Dehydration can cause you to be tired and thirsty, have a dry mouth, and urinate less frequently.  How is this diagnosed? This condition is diagnosed with a medical history and physical exam. You may also have tests, including:  A test that checks for C. diff in your stool.  Blood tests.  A sigmoidoscopy or colonoscopy to look at your colon. These procedures involve passing an instrument through your rectum to look at the inside of your colon.  How is this treated? Treatment for this condition includes:  Antibiotics that keep C. diff from growing.  Stopping the antibiotics you were on before the C. diff infection began. Only do this as told by your health care provider.  Fluids through an IV tube, if you are dehydrated.  Surgery to remove the infected part of the colon. This is rare.  Follow these instructions at home: Eating and drinking  Drink enough fluid to keep your urine clear or pale yellow. Avoid milk, caffeine, and alcohol.  Follow specific rehydration instructions  as told by your health care provider.  Eat small, frequent meals instead of large meals. Medicines  Take your antibiotic medicine as told by your health care provider. Do not stop taking the antibiotic even if you start to feel better unless your health care provider told you to do that.  Take over-the-counter and prescription medicines only as told by your health care provider.  Do not use medicines to help with diarrhea. General instructions  Wash your hands thoroughly before you prepare food and after you use the bathroom. Make sure people who live with you also wash their hands often.  Clean surfaces that you touch with a product that contains chlorine bleach.  Keep all follow-up visits as told by your health care provider. This is important. Contact a health care provider if:  Your symptoms do not get better  with treatment.  Your symptoms get worse with treatment.  Your symptoms go away and then return.  You have a fever.  You have new symptoms. Get help right away if:  You have increasing pain or tenderness in your abdomen.  You have stool that is mostly bloody, or your stool looks dark black and tarry.  You cannot eat or drink without vomiting.  You have signs of dehydration, such as: ? Dark urine, very little urine, or no urine. ? Cracked lips. ? Not making tears when you cry. ? Dry mouth. ? Sunken eyes. ? Sleepiness. ? Weakness. ? Dizziness. This information is not intended to replace advice given to you by your health care provider. Make sure you discuss any questions you have with your health care provider. Document Released: 12/30/2004 Document Revised: 08/28/2015 Document Reviewed: 09/23/2014 Elsevier Interactive Patient Education  2017 [employer].

## 2016-11-04 NOTE — Progress Notes (Signed)
Patient prepared for discharge, RN removed IV from left forearm without issue, discharge orders reviewed, no questions or concerns verbalized.  Patient and husband encouraged to call with any needs or concerns. Patient signed discharge summary, husband and off going RN escorted patient via wheelchair to elevator.

## 2016-11-04 NOTE — Progress Notes (Signed)
11/04/2016  Subjective: Patient is 3 Days Post-Op status post laparoscopic sigmoidectomy and splenic flexure mobilization. No acute events overnight. Patient has been tolerating her diet well and was advanced last night to a regular diet. Her medications were changed as well to by mouth and she was restarted on her oral vancomycin. Her pain is well-controlled. No further blood per rectum.  Vital signs: Temp:  [98.3 F (36.8 C)-98.6 F (37 C)] 98.3 F (36.8 C) (08/02 0509) Pulse Rate:  [75-81] 76 (08/02 0509) Resp:  [18-19] 18 (08/02 0509) BP: (120-138)/(78-92) 120/78 (08/02 0509) SpO2:  [96 %-99 %] 96 % (08/02 0509)   Intake/Output: 08/01 0701 - 08/02 0700 In: 920 [P.O.:240; I.V.:480; IV Piggyback:200] Out: -  Last BM Date: 11/03/16  Physical Exam: Constitutional: No acute distress Abdomen:  Soft, nondistended, properly tender to palpation. All incisions are clean dry and intact with staples in place.  Labs:   Recent Labs  11/02/16 0416 11/03/16 0412  WBC 13.1* 7.4  HGB 12.1 11.5*  HCT 36.3 33.1*  PLT 581* 516*    Recent Labs  11/02/16 0416 11/03/16 0412  NA 142 143  K 3.9 3.0*  CL 106 106  CO2 28 29  GLUCOSE 164* 108*  BUN <5* <5*  CREATININE 0.67 0.69  CALCIUM 8.8* 8.6*   No results for input(s): LABPROT, INR in the last 72 hours.  Imaging: No results found.  Assessment/Plan: 51 year old female status post laparoscopic sigmoidectomy.  -Continue regular diet. -Continue oral vancomycin. She will need a 2 week course from surgery date for treatment of her C. difficile colitis. -Possibly discharge to home today if doing very well.   Melvyn Neth, [place]

## 2016-11-04 NOTE — Discharge Summary (Signed)
Patient ID: Morgan Cisneros MRN: [id number] DOB/AGE: 50-Aug-1967 50 y.o.  Admit date: 10/31/2016 Discharge date: 11/04/2016  Discharge Diagnoses:  Diverticulitis and C. difficile colitis  Procedures Performed: Laparoscopic sigmoid colectomy  Discharged Condition: good  Hospital Course: Patient admitted the hospital with continued sigmoid diverticulitis and the settings of C. difficile colitis. She underwent a laparoscopic sigmoid colectomy with anastomosis on the second hospital day. She tolerated the procedure well and had an uneventful recovery. Her diet was gradually advanced and on the the time of discharge she was tolerating a regular diet. Her pain was controlled with oral medications. She was on all oral medications. Her abdomen was soft, nondistended and appropriately tender at her incision sites. There is no evidence of any erythema or infection to any of her incision sites.  Discharge Orders: Discharge Instructions    Call MD for:  difficulty breathing, headache or visual disturbances    Complete by:  As directed    Call MD for:  persistant nausea and vomiting    Complete by:  As directed    Call MD for:  redness, tenderness, or signs of infection (pain, swelling, redness, odor or green/yellow discharge around incision site)    Complete by:  As directed    Call MD for:  severe uncontrolled pain    Complete by:  As directed    Call MD for:  temperature >100.4    Complete by:  As directed    Diet - low sodium heart healthy    Complete by:  As directed    Increase activity slowly    Complete by:  As directed       Disposition: 01-Home or Self Care  Discharge Medications: Allergies as of 11/04/2016      Reactions   Urocit-k [potassium Citrate] Nausea Only      Medication List    STOP taking these medications   HYDROcodone-acetaminophen 5-325 MG tablet Commonly known as:  NORCO/VICODIN     TAKE these medications   DULoxetine HCl 40 MG Cpep Commonly known as:   CYMBALTA Take 40 mg by mouth daily.   LORazepam 1 MG tablet Commonly known as:  ATIVAN Take .5 to 1 tablets twice daily as needed for anxiety   oxyCODONE 5 MG immediate release tablet Commonly known as:  Oxy IR/ROXICODONE Take 1-2 tablets (5-10 mg total) by mouth every 4 (four) hours as needed for moderate pain or severe pain.   promethazine 25 MG tablet Commonly known as:  PHENERGAN Take 1 tablet (25 mg total) by mouth every 8 (eight) hours as needed for nausea or vomiting.   ranitidine 150 MG tablet Commonly known as:  ZANTAC Take 150 mg by mouth 2 (two) times daily as needed for heartburn.   sucralfate 1 g tablet Commonly known as:  CARAFATE Take 1 tablet (1 g total) by mouth 4 (four) times daily -  with meals and at bedtime.   vancomycin 50 mg/mL oral solution Commonly known as:  VANCOCIN Take 5 mLs (250 mg total) by mouth every 6 (six) hours.        Follwup: Follow-up Information    Clayburn Pert, MD. Go in 1 week(s).   Specialty:  General Surgery Why:  report to clinic at 1:15 for a 1:30 appointment on Thursday 8/9 with Dr. Mariann Barter information: [address] [place] [place] [address] [phone]           Signed: Clayburn Pert 11/04/2016, 9:35 PM

## 2016-11-11 ENCOUNTER — Ambulatory Visit (INDEPENDENT_AMBULATORY_CARE_PROVIDER_SITE_OTHER): Payer: BC Managed Care – PPO | Admitting: General Surgery

## 2016-11-11 ENCOUNTER — Encounter: Payer: Self-pay | Admitting: General Surgery

## 2016-11-11 VITALS — BP 108/77 | HR 96 | Temp 98.4°F | Ht 65.0 in | Wt 133.6 lb

## 2016-11-11 DIAGNOSIS — B9689 Other specified bacterial agents as the cause of diseases classified elsewhere: Secondary | ICD-10-CM

## 2016-11-11 DIAGNOSIS — A498 Other bacterial infections of unspecified site: Secondary | ICD-10-CM

## 2016-11-11 DIAGNOSIS — Z4889 Encounter for other specified surgical aftercare: Secondary | ICD-10-CM

## 2016-11-11 NOTE — Progress Notes (Signed)
Outpatient Surgical Follow Up  11/11/2016  Morgan Cisneros is an 51 y.o. female.   Chief Complaint  Patient presents with  . Routine Post Op    Laparoscopic Sigmoid Colectomy (11/01/16)- Dr. Adonis Huguenin    HPI: 51 year old female returns to clinic 10 days status post laparoscopic sigmoid colectomy. Patient reports doing well. Her pain is well-controlled and she is tolerating a diet. She is also had a history of C. difficile and reports that her last bout of diarrhea was more than 2 days ago. She is still on antibiotics for the C. difficile. She denies any current fevers, chills, nausea, vomiting, chest pain, shortness breath, diarrhea, constipation. She is still has a decrease in energy level but states it is improving daily.  Past Medical History:  Diagnosis Date  . Abdominal pain   . C. difficile colitis 10/2016  . Diverticulitis 10/2016  . Flank pain   . Gross hematuria   . History of kidney stones   . Insomnia   . Ovarian cyst   . Renal calculus, right   . Thoracic back pain    Right sided    Past Surgical History:  Procedure Laterality Date  . ABDOMINAL HYSTERECTOMY  08/2013  . ABDOMINAL HYSTERECTOMY    . BREAST BIOPSY Left 08/10/2016   [place] core  . CYSTOSCOPY  11/26/2010   with stent placement  . HEMORRHOID SURGERY    . kidney stent    . LAPAROSCOPIC SIGMOID COLECTOMY N/A 11/01/2016   Procedure: LAPAROSCOPIC SIGMOID COLECTOMY;  Surgeon: Clayburn Pert, MD;  Location: [facility] ORS;  Service: General;  Laterality: N/A;  . LITHOTRIPSY     X 4  . TONSILLECTOMY    . TUBAL LIGATION    . Ureteroscopy Right 11/21/1997   with holmium laser    Family History  Problem Relation Age of Onset  . Kidney Stones Mother   . Ovarian cancer Mother   . Colon cancer Mother   . Lung cancer Father   . Hyperlipidemia Sister   . Hypertension Sister   . Alzheimer's disease Maternal Grandmother   . Cancer Maternal Grandfather        Liver  . Alzheimer's disease Paternal Grandfather   .  Hypertension Sister   . Hyperlipidemia Sister   . Breast cancer Neg Hx     Social History:  reports that she has been smoking Cigarettes.  She has been smoking about 0.25 packs per day. She has never used smokeless tobacco. She reports that she drinks alcohol. She reports that she does not use drugs.  Allergies:  Allergies  Allergen Reactions  . Urocit-K [Potassium Citrate] Nausea Only    Medications reviewed.    ROS A multipoint review of systems was completed, all pertinent positives and negatives were documented within the history of present illness and remainder are negative   BP 108/77   Pulse 96   Temp 98.4 F (36.9 C) (Oral)   Ht 5\' 5"  (1.651 m)   Wt 60.6 kg (133 lb 9.6 oz)   BMI 22.23 kg/m   Physical Exam Gen.: No acute distress Chest: Clear to auscultation  heart: Regular rhythm Abdomen: Soft, appropriately tender to palpation at the incision sites, nondistended. All the laparoscopic incisions were well approximated with staples without any evidence of erythema or drainage.    No results found for this or any previous visit (from the past 48 hour(s)). No results found.  Assessment/Plan:  1. Aftercare following surgery 51 year old female 10 days status post laparoscopic sigmoid colectomy.  Staples removed today without any difficulty and replaced with Steri-Strips. Discussed appropriate activities and anticipated postoperative course for returning to normal activities. Patient voiced understanding and will follow-up in clinic in approximately 2 weeks for an additional exam and to review the results of the C. difficile testing.  2. Clostridium difficile infection Patient on therapy for C. difficile. She should complete her oral antibiotic therapy next week. Eradication therapy ordered by clinic today and results will be reviewed at her next visit. - Clostridium Difficile by PCR; Future - Clostridium Difficile by PCR     Clayburn Pert, MD FACS General  Surgeon  11/11/2016,2:11 PM

## 2016-11-11 NOTE — Patient Instructions (Addendum)
We will repeat your C-Diff testing once you are completely done with treatment. You may go to the medical mall. Please give the registration desk, your order provided.  The steri-strips we have applied today will come off in 10-14 days on their own.    GENERAL POST-OPERATIVE PATIENT INSTRUCTIONS   WOUND CARE INSTRUCTIONS:  Keep a dry clean dressing on the wound if there is drainage. The initial bandage may be removed after 24 hours.  Once the wound has quit draining you may leave it open to air.  If clothing rubs against the wound or causes irritation and the wound is not draining you may cover it with a dry dressing during the daytime.  Try to keep the wound dry and avoid ointments on the wound unless directed to do so.  If the wound becomes bright red and painful or starts to drain infected material that is not clear, please contact your physician immediately.  If the wound is mildly pink and has a thick firm ridge underneath it, this is normal, and is referred to as a healing ridge.  This will resolve over the next 4-6 weeks.  BATHING: You may shower if you have been informed of this by your surgeon. However, Please do not submerge in a tub, hot tub, or pool until incisions are completely sealed or have been told by your surgeon that you may do so.  DIET:  You may eat any foods that you can tolerate.  It is a good idea to eat a high fiber diet and take in plenty of fluids to prevent constipation.  If you do become constipated you may want to take a mild laxative or take ducolax tablets on a daily basis until your bowel habits are regular.  Constipation can be very uncomfortable, along with straining, after recent surgery.  ACTIVITY:  You are encouraged to cough and deep breath or use your incentive spirometer if you were given one, every 15-30 minutes when awake.  This will help prevent respiratory complications and low grade fevers post-operatively if you had a general anesthetic.  You may want  to hug a pillow when coughing and sneezing to add additional support to the surgical area, if you had abdominal or chest surgery, which will decrease pain during these times.  You are encouraged to walk and engage in light activity for the next two weeks.  You should not lift more than 20 pounds, until 12/13/2016 as it could put you at increased risk for complications.  Twenty pounds is roughly equivalent to a plastic bag of groceries. At that time- Listen to your body when lifting, if you have pain when lifting, stop and then try again in a few days. Soreness after doing exercises or activities of daily living is normal as you get back in to your normal routine.  MEDICATIONS:  Try to take narcotic medications and anti-inflammatory medications, such as tylenol, ibuprofen, naprosyn, etc., with food.  This will minimize stomach upset from the medication.  Should you develop nausea and vomiting from the pain medication, or develop a rash, please discontinue the medication and contact your physician.  You should not drive, make important decisions, or operate machinery when taking narcotic pain medication.  SUNBLOCK Use sun block to incision area over the next year if this area will be exposed to sun. This helps decrease scarring and will allow you avoid a permanent darkened area over your incision.  QUESTIONS:  Please feel free to call our office if you  have any questions, and we will be glad to assist you. [phone]

## 2016-11-16 ENCOUNTER — Other Ambulatory Visit
Admission: RE | Admit: 2016-11-16 | Discharge: 2016-11-16 | Disposition: A | Discharge status: Home / Self Care | Payer: BC Managed Care – PPO | Source: Ambulatory Visit | Attending: General Surgery | Admitting: General Surgery

## 2016-11-16 DIAGNOSIS — K5732 Diverticulitis of large intestine without perforation or abscess without bleeding: Secondary | ICD-10-CM | POA: Diagnosis present

## 2016-11-16 LAB — C DIFFICILE QUICK SCREEN W PCR REFLEX
C DIFFICILE (CDIFF) INTERP: NOT DETECTED
C DIFFICILE (CDIFF) TOXIN: NEGATIVE
C DIFFICLE (CDIFF) ANTIGEN: NEGATIVE

## 2016-11-17 ENCOUNTER — Telehealth: Payer: Self-pay

## 2016-11-17 ENCOUNTER — Ambulatory Visit: Payer: BC Managed Care – PPO | Admitting: Surgery

## 2016-11-17 ENCOUNTER — Telehealth: Payer: Self-pay | Admitting: Family Medicine

## 2016-11-17 NOTE — Telephone Encounter (Signed)
Needing clarification on FMLA paperwork such as the amount of time expected for patient to be out of work. Needing to also know how long the intermittence period (where patient will be out of work off and on) will last.   Please Advise.  Thank you

## 2016-11-17 NOTE — Telephone Encounter (Signed)
Patient called wanting to know what her C-Diff results were. I told her that they were negative. She then wanted to know if she could stop taking her antibiotics. I told her that I would ask a surgeon first before I give her an answer. Patient understood.

## 2016-11-17 NOTE — Telephone Encounter (Signed)
Entered in error

## 2016-11-18 NOTE — Telephone Encounter (Signed)
Called patient and had to leave her a voicemail letting her know that she should finish her antibiotics.

## 2016-11-19 ENCOUNTER — Telehealth: Payer: Self-pay | Admitting: General Surgery

## 2016-11-19 NOTE — Telephone Encounter (Signed)
Updated FMLA forms have been faxed to [phone]--[employer] on 11/19/16. Payment of 25.00 was collected.

## 2016-11-23 NOTE — Telephone Encounter (Signed)
Refer to telephone encounter for 11/19/2016. Unsure if FMLA was talked about with [facility] yet.

## 2016-11-25 ENCOUNTER — Other Ambulatory Visit: Payer: Self-pay | Admitting: Family Medicine

## 2016-11-25 NOTE — Telephone Encounter (Signed)
Please call in

## 2016-11-25 NOTE — Telephone Encounter (Signed)
Prescription called in to [facility] in [place].

## 2016-11-25 NOTE — Telephone Encounter (Signed)
Your patient 

## 2016-11-30 NOTE — Telephone Encounter (Signed)
Updated FMLA Paperwork was completed by her surgeon.

## 2016-12-01 ENCOUNTER — Encounter: Payer: Self-pay | Admitting: General Surgery

## 2016-12-02 ENCOUNTER — Encounter: Payer: Self-pay | Admitting: General Surgery

## 2016-12-07 ENCOUNTER — Telehealth: Payer: Self-pay

## 2016-12-07 NOTE — Telephone Encounter (Signed)
Patient called stating that she believes she is having C-diff return. She asked if she could be seen sooner by Dr. Adonis Huguenin. I stated that he will not be back in office until the 13th and that she could be seen by another provider. I offered patient an appointment to be seen today in the [facility] office and she declined it. So I scheduled her an appointment to be seen tomorrow with Dr. Rosana Hoes as early at 9:15 she stated that she would be able to come later that day. I gave her an appointment for 9/5 with Dr, Rosana Hoes at 3:15. Patient verbalized understanding and was thankful for the appointment.

## 2016-12-08 ENCOUNTER — Encounter: Payer: Self-pay | Admitting: Surgery

## 2016-12-08 ENCOUNTER — Other Ambulatory Visit
Admission: RE | Admit: 2016-12-08 | Discharge: 2016-12-08 | Disposition: A | Discharge status: Home / Self Care | Payer: BC Managed Care – PPO | Source: Ambulatory Visit | Attending: Surgery | Admitting: Surgery

## 2016-12-08 ENCOUNTER — Ambulatory Visit (INDEPENDENT_AMBULATORY_CARE_PROVIDER_SITE_OTHER): Payer: BC Managed Care – PPO | Admitting: Surgery

## 2016-12-08 VITALS — BP 130/87 | HR 84 | Temp 98.4°F | Ht 65.0 in | Wt 133.2 lb

## 2016-12-08 DIAGNOSIS — R197 Diarrhea, unspecified: Secondary | ICD-10-CM

## 2016-12-08 LAB — CBC WITH DIFFERENTIAL/PLATELET
Basophils Absolute: 0 10*3/uL (ref 0–0.1)
Basophils Relative: 1 %
Eosinophils Absolute: 0.2 10*3/uL (ref 0–0.7)
Eosinophils Relative: 3 %
HEMATOCRIT: 43.6 % (ref 35.0–47.0)
HEMOGLOBIN: 14.8 g/dL (ref 12.0–16.0)
LYMPHS ABS: 2.2 10*3/uL (ref 1.0–3.6)
Lymphocytes Relative: 26 %
MCH: 31.9 pg (ref 26.0–34.0)
MCHC: 33.9 g/dL (ref 32.0–36.0)
MCV: 94.2 fL (ref 80.0–100.0)
MONOS PCT: 4 %
Monocytes Absolute: 0.3 10*3/uL (ref 0.2–0.9)
NEUTROS ABS: 5.7 10*3/uL (ref 1.4–6.5)
NEUTROS PCT: 66 %
Platelets: 306 10*3/uL (ref 150–440)
RBC: 4.63 MIL/uL (ref 3.80–5.20)
RDW: 14.4 % (ref 11.5–14.5)
WBC: 8.4 10*3/uL (ref 3.6–11.0)

## 2016-12-08 LAB — COMPREHENSIVE METABOLIC PANEL
ALBUMIN: 4.3 g/dL (ref 3.5–5.0)
ALK PHOS: 154 U/L — AB (ref 38–126)
ALT: 31 U/L (ref 14–54)
ANION GAP: 8 (ref 5–15)
AST: 23 U/L (ref 15–41)
BILIRUBIN TOTAL: 0.5 mg/dL (ref 0.3–1.2)
BUN: 20 mg/dL (ref 6–20)
CALCIUM: 9.8 mg/dL (ref 8.9–10.3)
CO2: 28 mmol/L (ref 22–32)
CREATININE: 0.61 mg/dL (ref 0.44–1.00)
Chloride: 105 mmol/L (ref 101–111)
GFR calc Af Amer: >60 mL/min (ref >60)
GFR calc non Af Amer: >60 mL/min (ref >60)
GLUCOSE: 89 mg/dL (ref 65–99)
Potassium: 4.4 mmol/L (ref 3.5–5.1)
SODIUM: 141 mmol/L (ref 135–145)
TOTAL PROTEIN: 7.4 g/dL (ref 6.5–8.1)

## 2016-12-08 NOTE — Progress Notes (Signed)
Surgical Clinic Progress/Follow-up Note   HPI:  51 y.o. Female presents to clinic for post-op follow-up evaluation of mid-abdominal cramping and persistent diarrhea s/p completion of oral metronidazole and subsequent oral vancomycin for post-antibiotics C. Difficile colitis. Patient reports she's experienced similar symptoms for several years, though the explosive watery diarrhea she had just prior to surgery has resolved and returned to her chronic diarrhea and cramping. She otherwise reports intermittent nausea associated with her cramping abdominal pain without emesis and denies any relationship to eating/drinking, fever/chills, CP, or SOB.  Review of Systems:  Constitutional: denies any other weight loss, fever, chills, or sweats  Eyes: denies any other vision changes, history of eye injury  ENT: denies sore throat, hearing problems  Respiratory: denies shortness of breath, wheezing  Cardiovascular: denies chest pain, palpitations  Gastrointestinal: abdominal pain, N/V, and bowel function as per HPI Musculoskeletal: denies any other joint pains or cramps  Skin: Denies any other rashes or skin discolorations  Neurological: denies any other headache, dizziness, weakness  Psychiatric: denies any other depression, anxiety  All other review of systems: otherwise negative   Vital Signs:  BP 130/87   Pulse 84   Temp 98.4 F (36.9 C) (Oral)   Ht 5\' 5"  (1.651 m)   Wt 133 lb 3.2 oz (60.4 kg)   BMI 22.17 kg/m    Physical Exam:  Constitutional:  -- Normal body habitus  -- Awake, alert, and oriented x3  Eyes:  -- Pupils equally round and reactive to light  -- No scleral icterus  Ear, nose, throat:  -- No jugular venous distension  -- No nasal drainage, bleeding Pulmonary:  -- No crackles -- Equal breath sounds bilaterally -- Breathing non-labored at rest Cardiovascular:  -- S1, S2 present  -- No pericardial rubs  Gastrointestinal:  -- Soft, nontender, nondistended, no  guarding/rebound -- Post-surgical abdominal wounds NT and well-approximated without erythema or drainage -- No abdominal masses appreciated, pulsatile or otherwise Musculoskeletal / Integumentary:  -- Wounds or skin discoloration: None except post-surgical abdominal wounds as above (GI)  -- Extremities: B/L UE and LE FROM, hands and feet warm, no edema  Neurologic:  -- Motor function: intact and symmetric  -- Sensation: intact and symmetric   Assessment:  51 y.o. yo Female with a problem list including...  Patient Active Problem List   Diagnosis Date Noted  . Inflammation of sacroiliac joint ([place]) 10/28/2016  . Spasm of back muscles 10/28/2016  . Diverticulitis of large intestine without perforation or abscess without bleeding   . Diverticulitis 10/19/2016  . GERD (gastroesophageal reflux disease) 04/18/2015  . Menopausal symptoms 04/18/2015  . Renal calculus, right   . Insomnia   . Family history of colon cancer in mother 01/15/2015  . Family history of ovarian cancer 01/15/2015  . Tobacco abuse 01/15/2015  . Lymphadenopathy, axillary 01/15/2015  . Calculus of kidney 12/13/2014  . LBP (low back pain) 12/13/2014  . Female genuine stress incontinence 12/13/2014  . Dysuria 12/13/2014    presents to clinic for chronic persistent cramping abdominal pain and diarrhea, now >1 month s/p laparoscopic sigmoid colectomy for recurrent acute sigmoid colonic diverticulitis, complicated by appropriately treated pre-op post-antibiotics c. Difficile colitis.  Plan:   - check CBC, CMP, and C. Difficile PCR +/- stool ova/parasites  - if unremarkable, refer to GI for management of chronic abdominal cramps with chronic diarrhea  - will call with results of above, instructed to call office if any questions or concerns  All of the above recommendations were  discussed with the patient, and all of patient's questions were answered to her expressed satisfaction.  -- Marilynne Drivers Rosana Hoes, MD, [place]: [facility] - Partnering for exceptional care. Office: [phone]

## 2016-12-08 NOTE — Patient Instructions (Signed)
We have ordered some labs and stool testing to be done today. Please proceed to the [facility] to have these tests completed prior to leaving today. You will check in at the registration desk in the medical mall. Please see walking directions below if needed.  We will call you with the results and next step in plan of care as soon as results are received.   Directions to Medical Mall: When leaving our office, go right. Go all of the way down to the very end of the hallway. You will have a purple wall in front of you. You will now have a tunnel to the hospital on your left hand side. Go through this tunnel and the elevators will be on your left. Go down to the 1st floor and take a slight left. The very first desk on the right hand side is the registration desk.

## 2016-12-09 LAB — CLOSTRIDIUM DIFFICILE BY PCR: CDIFFPCR: NEGATIVE

## 2016-12-09 LAB — C DIFFICILE QUICK SCREEN W PCR REFLEX
C DIFFICILE (CDIFF) TOXIN: NEGATIVE
C DIFFICLE (CDIFF) ANTIGEN: POSITIVE — AB

## 2016-12-10 ENCOUNTER — Telehealth: Payer: Self-pay

## 2016-12-10 NOTE — Telephone Encounter (Signed)
Spoke with Dr. Burt Knack at this time in regards to patient's C-Diff testing. He states that patient does not need any further antibiotics but should begin Probiotics daily. No need for scheduled follow-up with surgeon but she should call with any questions or concerns.  Call made to patient. All information was given to patient. Encouraged to call if she has any concerns. She verbalizes understand.

## 2016-12-16 ENCOUNTER — Encounter: Payer: Self-pay | Admitting: General Surgery

## 2016-12-23 ENCOUNTER — Encounter: Payer: Self-pay | Admitting: Family Medicine

## 2016-12-23 DIAGNOSIS — L748 Other eccrine sweat disorders: Secondary | ICD-10-CM

## 2016-12-23 HISTORY — DX: Other eccrine sweat disorders: L74.8

## 2016-12-23 NOTE — Telephone Encounter (Signed)
Routing to provider for order.

## 2017-01-13 ENCOUNTER — Encounter: Payer: Self-pay | Admitting: Family Medicine

## 2017-01-13 ENCOUNTER — Ambulatory Visit (INDEPENDENT_AMBULATORY_CARE_PROVIDER_SITE_OTHER): Payer: BC Managed Care – PPO | Admitting: Family Medicine

## 2017-01-13 VITALS — BP 120/78 | HR 78 | Temp 98.5°F | Wt 140.7 lb

## 2017-01-13 DIAGNOSIS — F419 Anxiety disorder, unspecified: Secondary | ICD-10-CM | POA: Diagnosis not present

## 2017-01-13 DIAGNOSIS — Z23 Encounter for immunization: Secondary | ICD-10-CM | POA: Diagnosis not present

## 2017-01-13 MED ORDER — LORAZEPAM 1 MG PO TABS: 0.5000 mg | ORAL_TABLET | Freq: Two times a day (BID) | ORAL | 1 refills | Status: DC | PRN

## 2017-01-13 MED ORDER — DULOXETINE HCL 60 MG PO CPEP: 60.0000 mg | ORAL_CAPSULE | Freq: Every day | ORAL | 3 refills | Status: DC

## 2017-01-13 NOTE — Patient Instructions (Addendum)

## 2017-01-13 NOTE — Progress Notes (Signed)
BP 120/78 (BP Location: Left Arm, Patient Position: Sitting, Cuff Size: Normal)   Pulse 78   Temp 98.5 F (36.9 C)   Wt 140 lb 11.2 oz (63.8 kg)   SpO2 99%   BMI 23.41 kg/m    Subjective:    Patient ID: Morgan Cisneros, female    DOB: 12-Dec-1965, 51 y.o.   MRN: [id number]  HPI: Morgan Cisneros is a 51 y.o. female  Chief Complaint  Patient presents with  . Anxiety   ANXIETY/STRESS- lost her dad and her husbands dads suddenly. Starting to get a little easier, but still having a lot of trouble.  Duration:uncontrolled Anxious mood: yes  Excessive worrying: yes Irritability: no  Sweating: no Nausea: no Palpitations:no Hyperventilation: no Panic attacks: no Agoraphobia: no  Obscessions/compulsions: no Depressed mood: yes Depression screen [facility] 2/9 07/08/2016 05/16/2015 04/18/2015  Decreased Interest 0 1 1  Down, Depressed, Hopeless 1 1 0  PHQ - 2 Score 1 2 1    Anhedonia: no Weight changes: no Insomnia: no   Hypersomnia: no Fatigue/loss of energy: yes Feelings of worthlessness: no Feelings of guilt: yes Impaired concentration/indecisiveness: yes Suicidal ideations: no  Crying spells: yes Recent Stressors/Life Changes: yes   Relationship problems: no   Family stress: no     Financial stress: no    Job stress: no    Recent death/loss: yes   Relevant past medical, surgical, family and social history reviewed and updated as indicated. Interim medical history since our last visit reviewed. Allergies and medications reviewed and updated.  Review of Systems  Constitutional: Negative.   Respiratory: Negative.   Cardiovascular: Negative.   Psychiatric/Behavioral: Positive for dysphoric mood. Negative for agitation, behavioral problems, confusion, decreased concentration, hallucinations, self-injury, sleep disturbance and suicidal ideas. The patient is nervous/anxious. The patient is not hyperactive.     Per HPI unless specifically indicated above     Objective:     BP 120/78 (BP Location: Left Arm, Patient Position: Sitting, Cuff Size: Normal)   Pulse 78   Temp 98.5 F (36.9 C)   Wt 140 lb 11.2 oz (63.8 kg)   SpO2 99%   BMI 23.41 kg/m   Wt Readings from Last 3 Encounters:  01/13/17 140 lb 11.2 oz (63.8 kg)  12/08/16 133 lb 3.2 oz (60.4 kg)  11/11/16 133 lb 9.6 oz (60.6 kg)    Physical Exam  Constitutional: She is oriented to person, place, and time. She appears well-developed and well-nourished. No distress.  HENT:  Head: Normocephalic and atraumatic.  Right Ear: Hearing normal.  Left Ear: Hearing normal.  Nose: Nose normal.  Eyes: Conjunctivae and lids are normal. Right eye exhibits no discharge. Left eye exhibits no discharge. No scleral icterus.  Cardiovascular: Normal rate, regular rhythm, normal heart sounds and intact distal pulses.  Exam reveals no gallop and no friction rub.   No murmur heard. Pulmonary/Chest: Effort normal and breath sounds normal. No respiratory distress. She has no wheezes. She has no rales. She exhibits no tenderness.  Musculoskeletal: Normal range of motion.  Neurological: She is alert and oriented to person, place, and time.  Skin: Skin is warm, dry and intact. No rash noted. She is not diaphoretic. No erythema. No pallor.  Psychiatric: She has a normal mood and affect. Her speech is normal and behavior is normal. Judgment and thought content normal. Cognition and memory are normal.  Nursing note and vitals reviewed.   Results for orders placed or performed during the hospital encounter of 12/08/16  Clostridium Difficile by PCR  Result Value Ref Range   Toxigenic C Difficile by pcr NEGATIVE NEGATIVE  C difficile quick screen w PCR reflex  Result Value Ref Range   C Diff antigen POSITIVE (A) NEGATIVE   C Diff toxin NEGATIVE NEGATIVE   C Diff interpretation Results are indeterminate. See PCR results.   CBC with Differential/Platelet  Result Value Ref Range   WBC 8.4 3.6 - 11.0 K/uL   RBC 4.63 3.80 -  5.20 MIL/uL   Hemoglobin 14.8 12.0 - 16.0 g/dL   HCT 43.6 35.0 - 47.0 %   MCV 94.2 80.0 - 100.0 fL   MCH 31.9 26.0 - 34.0 pg   MCHC 33.9 32.0 - 36.0 g/dL   RDW 14.4 11.5 - 14.5 %   Platelets 306 150 - 440 K/uL   Neutrophils Relative % 66 %   Neutro Abs 5.7 1.4 - 6.5 K/uL   Lymphocytes Relative 26 %   Lymphs Abs 2.2 1.0 - 3.6 K/uL   Monocytes Relative 4 %   Monocytes Absolute 0.3 0.2 - 0.9 K/uL   Eosinophils Relative 3 %   Eosinophils Absolute 0.2 0 - 0.7 K/uL   Basophils Relative 1 %   Basophils Absolute 0.0 0 - 0.1 K/uL  Comprehensive metabolic panel  Result Value Ref Range   Sodium 141 135 - 145 mmol/L   Potassium 4.4 3.5 - 5.1 mmol/L   Chloride 105 101 - 111 mmol/L   CO2 28 22 - 32 mmol/L   Glucose, Bld 89 65 - 99 mg/dL   BUN 20 6 - 20 mg/dL   Creatinine, Ser 0.61 0.44 - 1.00 mg/dL   Calcium 9.8 8.9 - 10.3 mg/dL   Total Protein 7.4 6.5 - 8.1 g/dL   Albumin 4.3 3.5 - 5.0 g/dL   AST 23 15 - 41 U/L   ALT 31 14 - 54 U/L   Alkaline Phosphatase 154 (H) 38 - 126 U/L   Total Bilirubin 0.5 0.3 - 1.2 mg/dL   GFR calc non Af Amer >60 >60 mL/min   GFR calc Af Amer >60 >60 mL/min   Anion gap 8 5 - 15      Assessment & Plan:   Problem List Items Addressed This Visit    None    Visit Diagnoses    Anxiety    -  Primary   Not under great control. Dealing with grief right now. Will increase cymbalta to 60mg  and continue lorazepam right now. Recheck 4 weeks. Call with any concerns.   Relevant Medications   DULoxetine (CYMBALTA) 60 MG capsule   LORazepam (ATIVAN) 1 MG tablet   Need for influenza vaccination       Flu shot given today.   Relevant Orders   Flu Vaccine QUAD 6+ mos PF IM (Fluarix Quad PF) (Completed)       Follow up plan: Return in about 4 weeks (around 02/10/2017) for follow up mood.

## 2017-01-17 ENCOUNTER — Ambulatory Visit: Payer: BC Managed Care – PPO | Admitting: Gastroenterology

## 2017-01-24 ENCOUNTER — Encounter: Payer: Self-pay | Admitting: Gastroenterology

## 2017-01-24 ENCOUNTER — Ambulatory Visit (INDEPENDENT_AMBULATORY_CARE_PROVIDER_SITE_OTHER): Payer: BC Managed Care – PPO | Admitting: Gastroenterology

## 2017-01-24 ENCOUNTER — Other Ambulatory Visit: Payer: Self-pay

## 2017-01-24 VITALS — BP 125/70 | HR 91 | Temp 98.1°F | Ht 65.0 in | Wt 138.5 lb

## 2017-01-24 DIAGNOSIS — R194 Change in bowel habit: Secondary | ICD-10-CM | POA: Diagnosis not present

## 2017-01-24 DIAGNOSIS — R1084 Generalized abdominal pain: Secondary | ICD-10-CM

## 2017-01-24 NOTE — Progress Notes (Signed)
Gastroenterology Consultation  Referring Provider:     Vickie Epley, MD Primary Care Physician:  Valerie Roys, DO Primary Gastroenterologist:  Dr. Allen Norris     Reason for Consultation:     Abdominal pain        HPI:   Morgan Cisneros is a 51 y.o. y/o female referred for consultation & management of abdominal pain by Dr. Wynetta Emery, Megan P, DO.  This patient comes with a history of having C. difficile colitis treated by Flagyl and vancomycin. The patient had a resection due to her diverticulitis. She reports that her abdominal pain has improved but she has crampy abdominal pain after eating. She states it is with every meal she eats. She also reports that she has started to gain some of the weight she lost during her diverticulitis attack back. She also reports that she has mucus from her rectum. There is no report of any black stools or bloody stools. He also has a history of having colon polyps at her last colonoscopy 4 years ago.   Past Medical History:  Diagnosis Date  . Abdominal pain   . C. difficile colitis 10/2016  . Diverticulitis 10/2016  . Flank pain   . Gross hematuria   . History of kidney stones   . Insomnia   . Ovarian cyst   . Renal calculus, right   . Thoracic back pain    Right sided    Past Surgical History:  Procedure Laterality Date  . ABDOMINAL HYSTERECTOMY  08/2013  . ABDOMINAL HYSTERECTOMY    . BREAST BIOPSY Left 08/10/2016   [place] core  . CYSTOSCOPY  11/26/2010   with stent placement  . HEMORRHOID SURGERY    . kidney stent    . LAPAROSCOPIC SIGMOID COLECTOMY N/A 11/01/2016   Procedure: LAPAROSCOPIC SIGMOID COLECTOMY;  Surgeon: Clayburn Pert, MD;  Location: [facility] ORS;  Service: General;  Laterality: N/A;  . LITHOTRIPSY     X 4  . TONSILLECTOMY    . TUBAL LIGATION    . Ureteroscopy Right 11/21/1997   with holmium laser    Prior to Admission medications   Medication Sig Start Date End Date Taking? Authorizing Provider  DULoxetine  (CYMBALTA) 60 MG capsule Take 1 capsule (60 mg total) by mouth daily. 01/13/17   Johnson, Megan P, DO  LORazepam (ATIVAN) 1 MG tablet Take 0.5-1 tablets (0.5-1 mg total) by mouth 2 (two) times daily as needed. for anxiety 01/13/17   Park Liter P, DO  ranitidine (ZANTAC) 150 MG tablet Take 150 mg by mouth 2 (two) times daily as needed for heartburn.    [provider]    Family History  Problem Relation Age of Onset  . Kidney Stones Mother   . Ovarian cancer Mother   . Colon cancer Mother   . Lung cancer Father   . Hyperlipidemia Sister   . Hypertension Sister   . Alzheimer's disease Maternal Grandmother   . Cancer Maternal Grandfather        Liver  . Alzheimer's disease Paternal Grandfather   . Hypertension Sister   . Hyperlipidemia Sister   . Breast cancer Neg Hx      Social History  Substance Use Topics  . Smoking status: Current Every Day Smoker    Packs/day: 0.25    Types: Cigarettes  . Smokeless tobacco: Never Used  . Alcohol use 0.0 oz/week     Comment: occasional    Allergies as of 01/24/2017 - Review Complete 01/13/2017  Allergen Reaction Noted  . Urocit-k [potassium citrate] Nausea Only 10/22/2014    Review of Systems:    All systems reviewed and negative except where noted in HPI.   Physical Exam:  There were no vitals taken for this visit. No LMP recorded. Patient has had a hysterectomy. Psych:  Alert and cooperative. Normal mood and affect. General:   Alert,  Well-developed, well-nourished, pleasant and cooperative in NAD Head:  Normocephalic and atraumatic. Eyes:  Sclera clear, no icterus.   Conjunctiva pink. Ears:  Normal auditory acuity. Nose:  No deformity, discharge, or lesions. Mouth:  No deformity or lesions,oropharynx pink & moist. Neck:  Supple; no masses or thyromegaly. Lungs:  Respirations even and unlabored.  Clear throughout to auscultation.   No wheezes, crackles, or rhonchi. No acute distress. Heart:  Regular rate and rhythm;  no murmurs, clicks, rubs, or gallops. Abdomen:  Normal bowel sounds.  No bruits.  Soft, non-tender and non-distended without masses, hepatosplenomegaly or hernias noted.  No guarding or rebound tenderness.  Negative Carnett sign.   Rectal:  Deferred.  Msk:  Symmetrical without gross deformities.  Good, equal movement & strength bilaterally. Pulses:  Normal pulses noted. Extremities:  No clubbing or edema.  No cyanosis. Neurologic:  Alert and oriented x3;  grossly normal neurologically. Skin:  Intact without significant lesions or rashes.  No jaundice. Lymph Nodes:  No significant cervical adenopathy. Psych:  Alert and cooperative. Normal mood and affect.  Imaging Studies: No results found.  Assessment and Plan:   Morgan Cisneros is a 51 y.o. y/o female who has a history of diverticular colitis with resection. The patient states that since the episode of diverticular colitis and surgery the patient has had mucus per rectum and crampy abdominal pain after she eats. 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 The patient also has a history of polyps and due to her abdominal pain and cramps in addition to her polyp she will be set up for repeat colonoscopy. I have discussed risks & benefits which include, but are not limited to, bleeding, infection, perforation & drug reaction.  The patient agrees with this plan & written consent will be obtained.     Lucilla Lame, MD. Marval Regal   Note: This dictation was prepared with Dragon dictation along with smaller phrase technology. Any transcriptional errors that result from this process are unintentional.

## 2017-01-25 ENCOUNTER — Other Ambulatory Visit: Payer: Self-pay

## 2017-01-25 DIAGNOSIS — R194 Change in bowel habit: Secondary | ICD-10-CM

## 2017-02-01 ENCOUNTER — Observation Stay
Admission: EM | Admit: 2017-02-01 | Discharge: 2017-02-02 | Disposition: A | Discharge status: Home / Self Care | Payer: BC Managed Care – PPO | Attending: General Surgery | Admitting: General Surgery

## 2017-02-01 ENCOUNTER — Emergency Department: Payer: BC Managed Care – PPO

## 2017-02-01 DIAGNOSIS — K3580 Unspecified acute appendicitis: Principal | ICD-10-CM | POA: Insufficient documentation

## 2017-02-01 DIAGNOSIS — R109 Unspecified abdominal pain: Secondary | ICD-10-CM | POA: Diagnosis not present

## 2017-02-01 DIAGNOSIS — K37 Unspecified appendicitis: Secondary | ICD-10-CM

## 2017-02-01 DIAGNOSIS — Z79899 Other long term (current) drug therapy: Secondary | ICD-10-CM | POA: Insufficient documentation

## 2017-02-01 DIAGNOSIS — K219 Gastro-esophageal reflux disease without esophagitis: Secondary | ICD-10-CM | POA: Insufficient documentation

## 2017-02-01 DIAGNOSIS — F17210 Nicotine dependence, cigarettes, uncomplicated: Secondary | ICD-10-CM | POA: Insufficient documentation

## 2017-02-01 DIAGNOSIS — Z87442 Personal history of urinary calculi: Secondary | ICD-10-CM | POA: Insufficient documentation

## 2017-02-01 HISTORY — DX: Unspecified appendicitis: K37

## 2017-02-01 LAB — URINALYSIS, COMPLETE (UACMP) WITH MICROSCOPIC
Bacteria, UA: NONE SEEN
Bilirubin Urine: NEGATIVE
GLUCOSE, UA: NEGATIVE mg/dL
Ketones, ur: NEGATIVE mg/dL
Leukocytes, UA: NEGATIVE
NITRITE: NEGATIVE
PH: 6 (ref 5.0–8.0)
Protein, ur: NEGATIVE mg/dL
SPECIFIC GRAVITY, URINE: 1.01 (ref 1.005–1.030)

## 2017-02-01 LAB — COMPREHENSIVE METABOLIC PANEL
ALBUMIN: 3.9 g/dL (ref 3.5–5.0)
ALK PHOS: 160 U/L — AB (ref 38–126)
ALT: 32 U/L (ref 14–54)
ANION GAP: 8 (ref 5–15)
AST: 22 U/L (ref 15–41)
BILIRUBIN TOTAL: 0.6 mg/dL (ref 0.3–1.2)
BUN: 15 mg/dL (ref 6–20)
CALCIUM: 9 mg/dL (ref 8.9–10.3)
CO2: 24 mmol/L (ref 22–32)
Chloride: 107 mmol/L (ref 101–111)
Creatinine, Ser: 0.63 mg/dL (ref 0.44–1.00)
GFR calc Af Amer: >60 mL/min (ref >60)
GLUCOSE: 88 mg/dL (ref 65–99)
Potassium: 3.7 mmol/L (ref 3.5–5.1)
Sodium: 139 mmol/L (ref 135–145)
TOTAL PROTEIN: 7 g/dL (ref 6.5–8.1)

## 2017-02-01 LAB — CBC
HCT: 41.9 % (ref 35.0–47.0)
Hemoglobin: 13.9 g/dL (ref 12.0–16.0)
MCH: 31.4 pg (ref 26.0–34.0)
MCHC: 33.2 g/dL (ref 32.0–36.0)
MCV: 94.5 fL (ref 80.0–100.0)
Platelets: 291 10*3/uL (ref 150–440)
RBC: 4.43 MIL/uL (ref 3.80–5.20)
RDW: 14.8 % — AB (ref 11.5–14.5)
WBC: 8 10*3/uL (ref 3.6–11.0)

## 2017-02-01 LAB — LIPASE, BLOOD: Lipase: 30 U/L (ref 11–51)

## 2017-02-01 MED ORDER — ONDANSETRON HCL 4 MG/2ML IJ SOLN
4.0000 mg | Freq: Once | INTRAMUSCULAR | Status: AC
  Administered 2017-02-01: 4 mg via INTRAVENOUS

## 2017-02-01 MED ORDER — LORAZEPAM 0.5 MG PO TABS
0.5000 mg | ORAL_TABLET | Freq: Two times a day (BID) | ORAL | Status: DC | PRN
  Administered 2017-02-01: 1 mg via ORAL
  Filled 2017-02-01: qty 2

## 2017-02-01 MED ORDER — HYDRALAZINE HCL 20 MG/ML IJ SOLN: 10.0000 mg | INTRAMUSCULAR | Status: DC | PRN

## 2017-02-01 MED ORDER — MORPHINE SULFATE (PF) 4 MG/ML IV SOLN
4.0000 mg | INTRAVENOUS | Status: DC | PRN
  Administered 2017-02-01: 4 mg via INTRAVENOUS
  Filled 2017-02-01: qty 1

## 2017-02-01 MED ORDER — ONDANSETRON HCL 4 MG/2ML IJ SOLN
4.0000 mg | Freq: Four times a day (QID) | INTRAMUSCULAR | Status: DC | PRN
  Administered 2017-02-01 – 2017-02-02 (×2): 4 mg via INTRAVENOUS
  Filled 2017-02-01 (×2): qty 2

## 2017-02-01 MED ORDER — ZOLPIDEM TARTRATE 5 MG PO TABS: 5.0000 mg | ORAL_TABLET | Freq: Every evening | ORAL | Status: DC | PRN

## 2017-02-01 MED ORDER — ONDANSETRON 4 MG PO TBDP: 4.0000 mg | ORAL_TABLET | Freq: Four times a day (QID) | ORAL | Status: DC | PRN

## 2017-02-01 MED ORDER — MORPHINE SULFATE (PF) 4 MG/ML IV SOLN
INTRAVENOUS | Status: AC
  Filled 2017-02-01: qty 1

## 2017-02-01 MED ORDER — PANTOPRAZOLE SODIUM 40 MG IV SOLR
40.0000 mg | Freq: Every day | INTRAVENOUS | Status: DC
  Administered 2017-02-01: 40 mg via INTRAVENOUS
  Filled 2017-02-01: qty 40

## 2017-02-01 MED ORDER — DIPHENHYDRAMINE HCL 50 MG/ML IJ SOLN: 25.0000 mg | Freq: Four times a day (QID) | INTRAMUSCULAR | Status: DC | PRN

## 2017-02-01 MED ORDER — DIPHENHYDRAMINE HCL 25 MG PO CAPS: 25.0000 mg | ORAL_CAPSULE | Freq: Four times a day (QID) | ORAL | Status: DC | PRN

## 2017-02-01 MED ORDER — MORPHINE SULFATE (PF) 4 MG/ML IV SOLN
4.0000 mg | Freq: Once | INTRAVENOUS | Status: AC
  Administered 2017-02-01: 4 mg via INTRAVENOUS

## 2017-02-01 MED ORDER — SODIUM CHLORIDE 0.9 % IV SOLN
INTRAVENOUS | Status: DC
  Administered 2017-02-01 – 2017-02-02 (×2): via INTRAVENOUS

## 2017-02-01 MED ORDER — DULOXETINE HCL 30 MG PO CPEP: 60.0000 mg | ORAL_CAPSULE | Freq: Every day | ORAL | Status: DC

## 2017-02-01 MED ORDER — ONDANSETRON HCL 4 MG/2ML IJ SOLN
INTRAMUSCULAR | Status: AC
  Filled 2017-02-01: qty 2

## 2017-02-01 MED ORDER — IOPAMIDOL (ISOVUE-300) INJECTION 61%
30.0000 mL | Freq: Once | INTRAVENOUS | Status: AC | PRN
  Administered 2017-02-01: 30 mL via ORAL
  Filled 2017-02-01: qty 30

## 2017-02-01 MED ORDER — IOPAMIDOL (ISOVUE-300) INJECTION 61%
100.0000 mL | Freq: Once | INTRAVENOUS | Status: AC | PRN
  Administered 2017-02-01: 100 mL via INTRAVENOUS
  Filled 2017-02-01: qty 100

## 2017-02-01 MED ORDER — PNEUMOCOCCAL VAC POLYVALENT 25 MCG/0.5ML IJ INJ: 0.5000 mL | INJECTION | INTRAMUSCULAR | Status: DC

## 2017-02-01 MED ORDER — KETOROLAC TROMETHAMINE 60 MG/2ML IM SOLN
60.0000 mg | Freq: Once | INTRAMUSCULAR | Status: AC
  Administered 2017-02-01: 60 mg via INTRAMUSCULAR
  Filled 2017-02-01: qty 2

## 2017-02-01 MED ORDER — PIPERACILLIN-TAZOBACTAM 3.375 G IVPB
3.3750 g | INTRAVENOUS | Status: AC
  Administered 2017-02-02: 3.375 g via INTRAVENOUS
  Filled 2017-02-01: qty 50

## 2017-02-01 NOTE — ED Provider Notes (Signed)
[facility] Emergency Department Provider Note  ____________________________________________   I have reviewed the triage vital signs and the nursing notes.   HISTORY  Chief Complaint Abdominal Pain   History limited by: Not Limited   HPI Morgan Cisneros is a 51 y.o. female who presents to the emergency department today because of abdominal pain   LOCATION:right lower quarant DURATION:roughly 22 hours TIMING: constant SEVERITY: severe QUALITY: sharp CONTEXT: patient states she has a history of kidney stones. Pain does remind her slightly of her kidney stone pain in the past but without her typical dysuria. Patient states that the pain started in the RLQ.  MODIFYING FACTORS: worse with movement ASSOCIATED SYMPTOMS: slight nausea. Right flank pain. No change in defecation or urination. No fevers.   Per medical record review patient has a history of kidney stones identified on previous imaging, lithotripsy.   Past Medical History:  Diagnosis Date  . Abdominal pain   . C. difficile colitis 10/2016  . Diverticulitis 10/2016  . Flank pain   . Gross hematuria   . History of kidney stones   . Insomnia   . Ovarian cyst   . Renal calculus, right   . Thoracic back pain    Right sided    Patient Active Problem List   Diagnosis Date Noted  . 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 Spasm of back muscles 10/28/2016  . Diverticulitis of large intestine without perforation or abscess without bleeding   . Diverticulitis 10/19/2016  . GERD (gastroesophageal reflux disease) 04/18/2015  . Menopausal symptoms 04/18/2015  . Renal calculus, right   . Insomnia   . Family history of colon cancer in mother 01/15/2015  . Family history of ovarian cancer 01/15/2015  . Tobacco abuse 01/15/2015  . Lymphadenopathy, axillary 01/15/2015  . Calculus of kidney 12/13/2014  . LBP (low back pain) 12/13/2014  . Female genuine  stress incontinence 12/13/2014  . Dysuria 12/13/2014    Past Surgical History:  Procedure Laterality Date  . ABDOMINAL HYSTERECTOMY  08/2013  . ABDOMINAL HYSTERECTOMY    . BREAST BIOPSY Left 08/10/2016   [place] core  . CYSTOSCOPY  11/26/2010   with stent placement  . HEMORRHOID SURGERY    . kidney stent    . LAPAROSCOPIC SIGMOID COLECTOMY N/A 11/01/2016   Procedure: LAPAROSCOPIC SIGMOID COLECTOMY;  Surgeon: Clayburn Pert, MD;  Location: [facility] ORS;  Service: General;  Laterality: N/A;  . LITHOTRIPSY     X 4  . TONSILLECTOMY    . TUBAL LIGATION    . Ureteroscopy Right 11/21/1997   with holmium laser    Prior to Admission medications   Medication Sig Start Date End Date Taking? Authorizing Provider  DULoxetine (CYMBALTA) 60 MG capsule Take 1 capsule (60 mg total) by mouth daily. 01/13/17   Johnson, Megan P, DO  LORazepam (ATIVAN) 1 MG tablet Take 0.5-1 tablets (0.5-1 mg total) by mouth 2 (two) times daily as needed. for anxiety 01/13/17   Park Liter P, DO  ranitidine (ZANTAC) 150 MG tablet Take 150 mg by mouth 2 (two) times daily as needed for heartburn.    [provider]    Allergies Urocit-k [potassium citrate]  Family History  Problem Relation Age of Onset  . Kidney Stones Mother   . Ovarian cancer Mother   . Colon cancer Mother   . Lung cancer Father   . Hyperlipidemia Sister   . Hypertension Sister   . Alzheimer's disease Maternal  Grandmother   . Cancer Maternal Grandfather        Liver  . Alzheimer's disease Paternal Grandfather   . Hypertension Sister   . Hyperlipidemia Sister   . Breast cancer Neg Hx     Social History Social History  Substance Use Topics  . Smoking status: Current Every Day Smoker    Packs/day: 0.25    Types: Cigarettes  . Smokeless tobacco: Never Used  . Alcohol use 0.0 oz/week     Comment: occasional    Review of Systems Constitutional: No fever/chills Eyes: No visual changes. ENT: No sore throat. Cardiovascular:  Denies chest pain. Respiratory: Denies shortness of breath. Gastrointestinal: Positive for abdominal pain and nausea.  Genitourinary: Negative for dysuria. Musculoskeletal: Negative for back pain. Skin: Negative for rash. Neurological: Negative for headaches, focal weakness or numbness.  ____________________________________________   PHYSICAL EXAM:  VITAL SIGNS: ED Triage Vitals [02/01/17 1303]  Enc Vitals Group     BP 138/88     Pulse Rate 91     Resp 20     Temp 98.1 F (36.7 C)     Temp Source Oral     SpO2 98 %     Weight 138 lb (62.6 kg)     Height '5\' 5"'$  (1.651 m)     Head Circumference      Peak Flow      Pain Score 8   Constitutional: Alert and oriented. Well appearing and in no distress. Eyes: Conjunctivae are normal.  ENT   Head: Normocephalic and atraumatic.   Nose: No congestion/rhinnorhea.   Mouth/Throat: Mucous membranes are moist.   Neck: No stridor. Hematological/Lymphatic/Immunilogical: No cervical lymphadenopathy. Cardiovascular: Normal rate, regular rhythm.  No murmurs, rubs, or gallops. Respiratory: Normal respiratory effort without tachypnea nor retractions. Breath sounds are clear and equal bilaterally. No wheezes/rales/rhonchi. Gastrointestinal: Soft and slightly tender in the right lower quadrant. Slight right sided cva tenderness. No rebound. No guarding. Negative Rovsing.  Genitourinary: Deferred Musculoskeletal: Normal range of motion in all extremities. No lower extremity edema. Neurologic:  Normal speech and language. No gross focal neurologic deficits are appreciated.  Skin:  Skin is warm, dry and intact. No rash noted. Psychiatric: Mood and affect are normal. Speech and behavior are normal. Patient exhibits appropriate insight and judgment.  ____________________________________________    LABS (pertinent positives/negatives)  CMP wnl except alk phosphate 160 Lipase 30 CBC wnl except RDW 14.8 UA with moderate hgb urine  dipstick  ____________________________________________   EKG  None  ____________________________________________    RADIOLOGY  [place] Concern for loop of bowel seen on [place], possible appendicitis  CT abd/pel Possible colitis with secondary appendicitis or vice versa  I, Shea Kapur, personally discussed these images (CT scan) and results by phone with the on-call radiologist and used this discussion as part of my medical decision making.   ____________________________________________   PROCEDURES  Procedures  ____________________________________________   INITIAL IMPRESSION / ASSESSMENT AND PLAN / ED COURSE  Pertinent labs & imaging results that were available during my care of the patient were reviewed by me and considered in my medical decision making (see chart for details).  Patient presented to the emergency department today because of concerns for abdominal pain.  Differential would include appendicitis, colitis, diverticulitis, kidney stones, kidney infections amongst other etiologies.  Patient did not have any fever or leukocytosis but did have some blood in her urine.  At this point I thought perhaps kidney stone more likely to proceed with ultrasound renal.  While  this did not show any kidney stones it did raise concern for possible appendicitis.  CT abdomen pelvis was performed which did show inflammation in that area of perhaps somewhat unclear etiology.  Dr. Gwyndolyn Saxon with surgery was consulted.  He will admit the patient for possible appendicitis.  ____________________________________________   FINAL CLINICAL IMPRESSION(S) / ED DIAGNOSES  Final diagnoses:  Abdominal pain, unspecified abdominal location     Note: This dictation was prepared with Dragon dictation. Any transcriptional errors that result from this process are unintentional     Nance Pear, MD 02/01/17 2152

## 2017-02-01 NOTE — ED Notes (Signed)
Pt to US.

## 2017-02-01 NOTE — ED Notes (Signed)
Pt finished drinking oral CT contrast. CT staff informed.

## 2017-02-01 NOTE — ED Notes (Signed)
Wait explained to patient.  No new complaints.  Labs reviewed.

## 2017-02-01 NOTE — ED Triage Notes (Signed)
Pt having RLQ and right hip pain that started yesterday. Nausea. Denies VD. Pt reports having known kidney stones to right kidney on last CT scan. Denies urinary sx. No known injury. Pt alert and oriented X4, active, cooperative, pt in NAD. RR even and unlabored, color WNL.

## 2017-02-01 NOTE — H&P (Signed)
Patient ID: Morgan Cisneros, female   DOB: [birth year], 51 y.o.   MRN: [id number]  CC: Abdominal pain  HPI Morgan Cisneros is a 51 y.o. female who presents the ER today with a 1 day history of right lower quadrant abdominal pain.  Patient is well-known to the surgery service secondary to being 3 months status post sigmoid colectomy for diverticulitis.  She has had a complicated course secondary to C. difficile colitis which is currently resolved.  This pain is new and started acutely.  It is always been in the right lower quadrant and is stabbing and exquisite in nature.  It is worsened with certain movements or with any palpation to the area.  She denies any fevers, chills, chest pain, shortness of breath.  She has had some subjective nausea but no vomiting.  She is hungry.  She has been having alternating constipation with diarrhea over the last several weeks and states she is currently constipated.  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  HPI  Past Medical History:  Diagnosis Date  . Abdominal pain   . C. difficile colitis 10/2016  . Diverticulitis 10/2016  . Flank pain   . Gross hematuria   . History of kidney stones   . Insomnia   . Ovarian cyst   . Renal calculus, right   . Thoracic back pain    Right sided    Past Surgical History:  Procedure Laterality Date  . ABDOMINAL HYSTERECTOMY  08/2013  . ABDOMINAL HYSTERECTOMY    . BREAST BIOPSY Left 08/10/2016   [place] core  . CYSTOSCOPY  11/26/2010   with stent placement  . HEMORRHOID SURGERY    . kidney stent    . LAPAROSCOPIC SIGMOID COLECTOMY N/A 11/01/2016   Procedure: LAPAROSCOPIC SIGMOID COLECTOMY;  Surgeon: Clayburn Pert, MD;  Location: [facility] ORS;  Service: General;  Laterality: N/A;  . LITHOTRIPSY     X 4  . TONSILLECTOMY    . TUBAL LIGATION    . Ureteroscopy Right 11/21/1997   with holmium laser    Family History  Problem Relation Age of Onset  . Kidney  Stones Mother   . Ovarian cancer Mother   . Colon cancer Mother   . Lung cancer Father   . Hyperlipidemia Sister   . Hypertension Sister   . Alzheimer's disease Maternal Grandmother   . Cancer Maternal Grandfather        Liver  . Alzheimer's disease Paternal Grandfather   . Hypertension Sister   . Hyperlipidemia Sister   . Breast cancer Neg Hx     Social History Social History  Substance Use Topics  . Smoking status: Current Every Day Smoker    Packs/day: 0.25    Types: Cigarettes  . Smokeless tobacco: Never Used  . Alcohol use 0.0 oz/week     Comment: occasional    Allergies  Allergen Reactions  . Urocit-K [Potassium Citrate] Nausea Only    Current Facility-Administered Medications  Medication Dose Route Frequency Provider Last Rate Last Dose  . morphine 4 MG/ML injection           . ondansetron (ZOFRAN) 4 MG/2ML injection            Current Outpatient Prescriptions  Medication Sig Dispense Refill  . DULoxetine (CYMBALTA) 60 MG capsule Take 1 capsule (60 mg total) by mouth daily. 30 capsule 3  . LORazepam (ATIVAN) 1 MG tablet Take 0.5-1 tablets (0.5-1 mg total) by  mouth 2 (two) times daily as needed. for anxiety 60 tablet 1  . ranitidine (ZANTAC) 150 MG tablet Take 150 mg by mouth 2 (two) times daily as needed for heartburn.       Review of Systems A multi-point review of systems was asked and was negative except for the findings documented in the HPI  Physical Exam Blood pressure (!) 144/89, pulse 84, temperature 98.1 F (36.7 C), temperature source Oral, resp. rate 16, height 5\' 5"  (1.651 m), weight 62.6 kg (138 lb), SpO2 99 %. CONSTITUTIONAL: No acute distress. EYES: Pupils are equal, round, and reactive to light, Sclera are non-icteric. EARS, NOSE, MOUTH AND THROAT: The oropharynx is clear. The oral mucosa is pink and moist. Hearing is intact to voice. LYMPH NODES:  Lymph nodes in the neck are normal. RESPIRATORY:  Lungs are clear. There is normal  respiratory effort, with equal breath sounds bilaterally, and without pathologic use of accessory muscles. CARDIOVASCULAR: Heart is regular without murmurs, gallops, or rubs. GI: The abdomen is soft, tender to palpation in the right lower quadrant at McBurney's point, and nondistended. There are no palpable masses with multiple well-healed prior laparoscopic incision sites. There is no hepatosplenomegaly. There are normal bowel sounds in all quadrants. GU: Rectal deferred.   MUSCULOSKELETAL: Normal muscle strength and tone. No cyanosis or edema.   SKIN: Turgor is good and there are no pathologic skin lesions or ulcers. NEUROLOGIC: Motor and sensation is grossly normal. Cranial nerves are grossly intact. PSYCH:  Oriented to person, place and time. Affect is normal.  Data Reviewed Images and labs reviewed with labs being normal with the exception of an alkaline phosphatase of 160.  All of her electrolytes are in the normal ranaher white blood cell count is 8.0, her H&H is 13.9/41.9, her platelets are 291.  She does have some moderate hemoglobin on her urinalysis but no evidence of infection.  CT scan of the abdomen shows inflammation around the appendix and a sending colon.  There is fluid around the appendix but no evidence of free air or abscess. I have personally reviewed the patient's imaging, laboratory findings and medical records.    Assessment    Right lower quadrant pain    Plan    51 year old female with right lower quadrant pain.  Given her history and physical exam discussed this is more likely to be appendicitis then colitis with infection of the appendix.  Discussed the treatment options of appendicitis including antibiotic therapy versus surgical intervention.  Given her recent history of C. difficile, patient states she would prefer to have surgery to remove her appendix.  Discussed the procedure of a laparoscopic appendectomy in detail.  We will plan to bring the patient into the  hospital under observation overnight.  She will be made n.p.o. after midnight and scheduled for laparoscopic procedure tomorrow.  Patient voiced understanding with this plan.     Time spent with the patient was 50 minutes, with more than 50% of the time spent in face-to-face education, counseling and care coordination.     Clayburn Pert, MD FACS General Surgeon 02/01/2017, 6:06 PM

## 2017-02-01 NOTE — ED Notes (Signed)
Report to Lauren, RN

## 2017-02-01 NOTE — ED Notes (Signed)
Consulting provider at bedside

## 2017-02-01 NOTE — ED Notes (Signed)
Patient transported to CT 

## 2017-02-01 NOTE — ED Notes (Signed)
RLQ pain that started yesterday. Burning in nature. Pain to right hip area as well. Worse with walking. Nausea. Denies vomiting or diarrhea. Pt alert and oriented X4, active, cooperative, pt in NAD. RR even and unlabored, color WNL.

## 2017-02-01 NOTE — ED Notes (Signed)
ED Provider at bedside. 

## 2017-02-02 ENCOUNTER — Observation Stay: Payer: BC Managed Care – PPO | Admitting: Anesthesiology

## 2017-02-02 ENCOUNTER — Encounter
Admission: EM | Disposition: A | Discharge status: Home / Self Care | Payer: Self-pay | Source: Home / Self Care | Attending: Emergency Medicine

## 2017-02-02 ENCOUNTER — Encounter: Payer: Self-pay | Admitting: Anesthesiology

## 2017-02-02 DIAGNOSIS — K3580 Unspecified acute appendicitis: Secondary | ICD-10-CM | POA: Diagnosis not present

## 2017-02-02 HISTORY — PX: LAPAROSCOPIC APPENDECTOMY: SHX408

## 2017-02-02 LAB — BASIC METABOLIC PANEL
Anion gap: 7 (ref 5–15)
BUN: 15 mg/dL (ref 6–20)
CHLORIDE: 108 mmol/L (ref 101–111)
CO2: 26 mmol/L (ref 22–32)
Calcium: 8.4 mg/dL — ABNORMAL LOW (ref 8.9–10.3)
Creatinine, Ser: 0.71 mg/dL (ref 0.44–1.00)
GFR calc Af Amer: >60 mL/min (ref >60)
GFR calc non Af Amer: >60 mL/min (ref >60)
GLUCOSE: 131 mg/dL — AB (ref 65–99)
POTASSIUM: 3.4 mmol/L — AB (ref 3.5–5.1)
Sodium: 141 mmol/L (ref 135–145)

## 2017-02-02 LAB — CBC
HEMATOCRIT: 37.3 % (ref 35.0–47.0)
HEMOGLOBIN: 12.6 g/dL (ref 12.0–16.0)
MCH: 32.1 pg (ref 26.0–34.0)
MCHC: 33.7 g/dL (ref 32.0–36.0)
MCV: 95.1 fL (ref 80.0–100.0)
PLATELETS: 255 10*3/uL (ref 150–440)
RBC: 3.93 MIL/uL (ref 3.80–5.20)
RDW: 14.6 % — ABNORMAL HIGH (ref 11.5–14.5)
WBC: 5.6 10*3/uL (ref 3.6–11.0)

## 2017-02-02 SURGERY — APPENDECTOMY, LAPAROSCOPIC
Anesthesia: General

## 2017-02-02 MED ORDER — SUGAMMADEX SODIUM 200 MG/2ML IV SOLN
INTRAVENOUS | Status: AC
  Filled 2017-02-02: qty 2

## 2017-02-02 MED ORDER — ACETAMINOPHEN 10 MG/ML IV SOLN
INTRAVENOUS | Status: AC
  Filled 2017-02-02: qty 100

## 2017-02-02 MED ORDER — ACETAMINOPHEN 10 MG/ML IV SOLN
INTRAVENOUS | Status: DC | PRN
  Administered 2017-02-02: 1000 mg via INTRAVENOUS

## 2017-02-02 MED ORDER — ONDANSETRON HCL 4 MG/2ML IJ SOLN: 4.0000 mg | Freq: Once | INTRAMUSCULAR | Status: DC | PRN

## 2017-02-02 MED ORDER — FENTANYL CITRATE (PF) 100 MCG/2ML IJ SOLN
INTRAMUSCULAR | Status: AC
  Filled 2017-02-02: qty 2

## 2017-02-02 MED ORDER — LIDOCAINE HCL 1 % IJ SOLN
INTRAMUSCULAR | Status: DC | PRN
  Administered 2017-02-02: 13 mL

## 2017-02-02 MED ORDER — PROPOFOL 10 MG/ML IV BOLUS
INTRAVENOUS | Status: DC | PRN
  Administered 2017-02-02: 130 mg via INTRAVENOUS

## 2017-02-02 MED ORDER — KETOROLAC TROMETHAMINE 30 MG/ML IJ SOLN
INTRAMUSCULAR | Status: DC | PRN
  Administered 2017-02-02: 30 mg via INTRAVENOUS

## 2017-02-02 MED ORDER — MIDAZOLAM HCL 2 MG/2ML IJ SOLN
INTRAMUSCULAR | Status: DC | PRN
  Administered 2017-02-02: 2 mg via INTRAVENOUS

## 2017-02-02 MED ORDER — SUCCINYLCHOLINE CHLORIDE 20 MG/ML IJ SOLN
INTRAMUSCULAR | Status: DC | PRN
  Administered 2017-02-02: 80 mg via INTRAVENOUS

## 2017-02-02 MED ORDER — ONDANSETRON HCL 4 MG/2ML IJ SOLN
INTRAMUSCULAR | Status: DC | PRN
  Administered 2017-02-02: 4 mg via INTRAVENOUS

## 2017-02-02 MED ORDER — DEXAMETHASONE SODIUM PHOSPHATE 10 MG/ML IJ SOLN
INTRAMUSCULAR | Status: AC
Start: 2017-02-02 — End: ?
  Filled 2017-02-02: qty 1

## 2017-02-02 MED ORDER — LIDOCAINE HCL (PF) 1 % IJ SOLN
INTRAMUSCULAR | Status: AC
  Filled 2017-02-02: qty 30

## 2017-02-02 MED ORDER — MIDAZOLAM HCL 2 MG/2ML IJ SOLN
INTRAMUSCULAR | Status: AC
  Filled 2017-02-02: qty 2

## 2017-02-02 MED ORDER — ONDANSETRON HCL 4 MG/2ML IJ SOLN
INTRAMUSCULAR | Status: AC
  Filled 2017-02-02: qty 2

## 2017-02-02 MED ORDER — DEXAMETHASONE SODIUM PHOSPHATE 10 MG/ML IJ SOLN
INTRAMUSCULAR | Status: DC | PRN
  Administered 2017-02-02: 4 mg via INTRAVENOUS

## 2017-02-02 MED ORDER — PROPOFOL 10 MG/ML IV BOLUS
INTRAVENOUS | Status: AC
  Filled 2017-02-02: qty 20

## 2017-02-02 MED ORDER — ROCURONIUM BROMIDE 100 MG/10ML IV SOLN
INTRAVENOUS | Status: DC | PRN
  Administered 2017-02-02: 40 mg via INTRAVENOUS

## 2017-02-02 MED ORDER — LIDOCAINE HCL (PF) 2 % IJ SOLN
INTRAMUSCULAR | Status: AC
  Filled 2017-02-02: qty 10

## 2017-02-02 MED ORDER — OXYCODONE-ACETAMINOPHEN 5-325 MG PO TABS
1.0000 | ORAL_TABLET | Freq: Four times a day (QID) | ORAL | Status: DC | PRN
  Administered 2017-02-02: 2 via ORAL
  Filled 2017-02-02: qty 2

## 2017-02-02 MED ORDER — BUPIVACAINE-EPINEPHRINE (PF) 0.5% -1:200000 IJ SOLN
INTRAMUSCULAR | Status: AC
  Filled 2017-02-02: qty 30

## 2017-02-02 MED ORDER — BUPIVACAINE-EPINEPHRINE 0.5% -1:200000 IJ SOLN
INTRAMUSCULAR | Status: DC | PRN
  Administered 2017-02-02: 13 mL

## 2017-02-02 MED ORDER — ROCURONIUM BROMIDE 50 MG/5ML IV SOLN
INTRAVENOUS | Status: AC
  Filled 2017-02-02: qty 1

## 2017-02-02 MED ORDER — FENTANYL CITRATE (PF) 100 MCG/2ML IJ SOLN: 25.0000 ug | INTRAMUSCULAR | Status: DC | PRN

## 2017-02-02 MED ORDER — OXYCODONE-ACETAMINOPHEN 5-325 MG PO TABS: 1.0000 | ORAL_TABLET | Freq: Four times a day (QID) | ORAL | 0 refills | Status: DC | PRN

## 2017-02-02 MED ORDER — LIDOCAINE HCL (CARDIAC) 20 MG/ML IV SOLN
INTRAVENOUS | Status: DC | PRN
  Administered 2017-02-02: 70 mg via INTRAVENOUS

## 2017-02-02 MED ORDER — KETOROLAC TROMETHAMINE 30 MG/ML IJ SOLN
INTRAMUSCULAR | Status: AC
Start: 2017-02-02 — End: ?
  Filled 2017-02-02: qty 1

## 2017-02-02 MED ORDER — SUGAMMADEX SODIUM 200 MG/2ML IV SOLN
INTRAVENOUS | Status: DC | PRN
  Administered 2017-02-02: 150 mg via INTRAVENOUS

## 2017-02-02 MED ORDER — FENTANYL CITRATE (PF) 100 MCG/2ML IJ SOLN
INTRAMUSCULAR | Status: DC | PRN
  Administered 2017-02-02: 100 ug via INTRAVENOUS

## 2017-02-02 SURGICAL SUPPLY — 42 items
ADHESIVE MASTISOL STRL (MISCELLANEOUS) ×2 IMPLANT
APPLIER CLIP 5 13 M/L LIGAMAX5 (MISCELLANEOUS)
BLADE SURG SZ11 CARB STEEL (BLADE) ×2 IMPLANT
BULB RESERV EVAC DRAIN JP 100C (MISCELLANEOUS) IMPLANT
CANISTER SUCT 1200ML W/VALVE (MISCELLANEOUS) ×2 IMPLANT
CHLORAPREP W/TINT 26ML (MISCELLANEOUS) ×2 IMPLANT
CLIP APPLIE 5 13 M/L LIGAMAX5 (MISCELLANEOUS) IMPLANT
CUTTER FLEX LINEAR 45M (STAPLE) ×2 IMPLANT
DRAIN CHANNEL JP 19F (MISCELLANEOUS) IMPLANT
DRSG TEGADERM 2-3/8X2-3/4 SM (GAUZE/BANDAGES/DRESSINGS) ×6 IMPLANT
DRSG TELFA 4X3 1S NADH ST (GAUZE/BANDAGES/DRESSINGS) ×2 IMPLANT
ELECT REM PT RETURN 9FT ADLT (ELECTROSURGICAL) ×2
ELECTRODE REM PT RTRN 9FT ADLT (ELECTROSURGICAL) ×1 IMPLANT
GLOVE BIO SURGEON STRL SZ7.5 (GLOVE) ×2 IMPLANT
GLOVE INDICATOR 8.0 STRL GRN (GLOVE) ×2 IMPLANT
GOWN STRL REUS W/ TWL LRG LVL3 (GOWN DISPOSABLE) ×2 IMPLANT
GOWN STRL REUS W/TWL LRG LVL3 (GOWN DISPOSABLE) ×2
IRRIGATION STRYKERFLOW (MISCELLANEOUS) ×1 IMPLANT
IRRIGATOR STRYKERFLOW (MISCELLANEOUS) ×2
IV NS 1000ML (IV SOLUTION) ×1
IV NS 1000ML BAXH (IV SOLUTION) ×1 IMPLANT
KIT RM TURNOVER STRD PROC AR (KITS) ×2 IMPLANT
LABEL OR SOLS (LABEL) ×2 IMPLANT
LIGASURE VESSEL 5MM BLUNT TIP (ELECTROSURGICAL) ×2 IMPLANT
NEEDLE HYPO 25X1 1.5 SAFETY (NEEDLE) ×2 IMPLANT
NEEDLE VERESS 14GA 120MM (NEEDLE) ×2 IMPLANT
NS IRRIG 500ML POUR BTL (IV SOLUTION) ×2 IMPLANT
PACK LAP CHOLECYSTECTOMY (MISCELLANEOUS) ×2 IMPLANT
POUCH SPECIMEN RETRIEVAL 10MM (ENDOMECHANICALS) ×2 IMPLANT
RELOAD 45 VASCULAR/THIN (ENDOMECHANICALS) ×2 IMPLANT
RELOAD STAPLE TA45 3.5 REG BLU (ENDOMECHANICALS) ×2 IMPLANT
SCALPEL HARMONIC ACE (MISCELLANEOUS) ×2 IMPLANT
SLEEVE ENDOPATH XCEL 5M (ENDOMECHANICALS) ×2 IMPLANT
STRIP CLOSURE SKIN 1/2X4 (GAUZE/BANDAGES/DRESSINGS) ×2 IMPLANT
SUT MNCRL 4-0 (SUTURE) ×1
SUT MNCRL 4-0 27XMFL (SUTURE) ×1
SUT VICRYL 0 UR6 27IN ABS (SUTURE) IMPLANT
SUTURE MNCRL 4-0 27XMF (SUTURE) ×1 IMPLANT
TRAY FOLEY W/METER SILVER 16FR (SET/KITS/TRAYS/PACK) ×2 IMPLANT
TROCAR XCEL 12X100 BLDLESS (ENDOMECHANICALS) ×2 IMPLANT
TROCAR XCEL NON-BLD 5MMX100MML (ENDOMECHANICALS) ×2 IMPLANT
TUBING INSUFFLATOR HI FLOW (MISCELLANEOUS) ×2 IMPLANT

## 2017-02-02 NOTE — Anesthesia Procedure Notes (Signed)
Procedure Name: Intubation Performed by: Lance Muss Pre-anesthesia Checklist: Patient identified, Patient being monitored, Timeout performed, Emergency Drugs available and Suction available Patient Re-evaluated:Patient Re-evaluated prior to induction Oxygen Delivery Method: Circle system utilized Preoxygenation: Pre-oxygenation with 100% oxygen Induction Type: IV induction, Rapid sequence and Cricoid Pressure applied Ventilation: Mask ventilation without difficulty Laryngoscope Size: Mac and 3 Grade View: Grade III Tube type: Oral Tube size: 7.0 mm Number of attempts: 2 Airway Equipment and Method: Stylet and Bougie stylet Placement Confirmation: ETT inserted through vocal cords under direct vision,  positive ETCO2 and breath sounds checked- equal and bilateral Secured at: 21 cm Tube secured with: Tape Dental Injury: Teeth and Oropharynx as per pre-operative assessment  Difficulty Due To: Difficult Airway- due to anterior larynx

## 2017-02-02 NOTE — Discharge Summary (Signed)
Patient ID: Morgan Cisneros MRN: [id number] DOB/AGE: July 31, 1965 51 y.o.  Admit date: 02/01/2017 Discharge date: 02/02/2017  Discharge Diagnoses:  Appendicitis  Procedures Performed: Laparoscopic Appendectomy  Discharged Condition: good  Hospital Course: Patient brought into the hospital with a diagnosis of appendicitis. Underwent a laparoscopic appendectomy with complete resolution of her abdominal pain. Tolerated a diet and was able to be discharged the afternoon after surgery.  Discharge Orders: Discharge Instructions    Call MD for:  persistant nausea and vomiting    Complete by:  As directed    Call MD for:  redness, tenderness, or signs of infection (pain, swelling, redness, odor or green/yellow discharge around incision site)    Complete by:  As directed    Call MD for:  severe uncontrolled pain    Complete by:  As directed    Call MD for:  temperature >100.4    Complete by:  As directed    Diet - low sodium heart healthy    Complete by:  As directed    Increase activity slowly    Complete by:  As directed       Disposition: 01-Home or Self Care  Discharge Medications: Allergies as of 02/02/2017      Reactions   Urocit-k [potassium Citrate] Nausea Only      Medication List    TAKE these medications   DULoxetine 60 MG capsule Commonly known as:  CYMBALTA Take 1 capsule (60 mg total) by mouth daily.   LORazepam 1 MG tablet Commonly known as:  ATIVAN Take 0.5-1 tablets (0.5-1 mg total) by mouth 2 (two) times daily as needed. for anxiety   oxyCODONE-acetaminophen 5-325 MG tablet Commonly known as:  PERCOCET/ROXICET Take 1-2 tablets by mouth every 6 (six) hours as needed for moderate pain or severe pain.   ranitidine 150 MG tablet Commonly known as:  ZANTAC Take 150 mg by mouth 2 (two) times daily as needed for heartburn.        Follwup: Follow-up Information    Clayburn Pert, MD. Go in 8 day(s).   Specialty:  General Surgery Why:  Report to  clinic at 10:00am for a 10:15 appointment with Dr. Mariann Barter information: [address] [place] [id number] [phone]           Signed: Clayburn Pert 02/02/2017, 3:36 PM

## 2017-02-02 NOTE — Anesthesia Post-op Follow-up Note (Signed)
Anesthesia QCDR form completed.        

## 2017-02-02 NOTE — Progress Notes (Signed)
Morgan Cisneros to be D/C'd home per MD order.  Discussed prescriptions and follow up appointments with the patient. Prescriptions given to patient, medication list explained in detail. Pt verbalized understanding.  Allergies as of 02/02/2017      Reactions   Urocit-k [potassium Citrate] Nausea Only      Medication List    TAKE these medications   DULoxetine 60 MG capsule Commonly known as:  CYMBALTA Take 1 capsule (60 mg total) by mouth daily.   LORazepam 1 MG tablet Commonly known as:  ATIVAN Take 0.5-1 tablets (0.5-1 mg total) by mouth 2 (two) times daily as needed. for anxiety   oxyCODONE-acetaminophen 5-325 MG tablet Commonly known as:  PERCOCET/ROXICET Take 1-2 tablets by mouth every 6 (six) hours as needed for moderate pain or severe pain.   ranitidine 150 MG tablet Commonly known as:  ZANTAC Take 150 mg by mouth 2 (two) times daily as needed for heartburn.       Vitals:   02/02/17 1320 02/02/17 1431  BP: 110/76 110/70  Pulse: 72 71  Resp: 17   Temp: 98.4 F (36.9 C) 98 F (36.7 C)  SpO2: 97% 98%    Skin clean, dry and intact without evidence of skin break down, no evidence of skin tears noted. IV catheter discontinued intact. Site without signs and symptoms of complications. Dressing and pressure applied. Pt denies pain at this time. No complaints noted.  An After Visit Summary was printed and given to the patient. Patient escorted via [place], and D/C home via private auto.  Morgan Cisneros

## 2017-02-02 NOTE — Discharge Instructions (Signed)
Laparoscopic Appendectomy, Adult, Care After Refer to this sheet in the next few weeks. These instructions provide you with information about caring for yourself after your procedure. Your health care provider may also give you more specific instructions. Your treatment has been planned according to current medical practices, but problems sometimes occur. Call your health care provider if you have any problems or questions after your procedure. What can I expect after the procedure? After the procedure, it is common to have:  A decrease in your energy level.  Mild pain in the area where the surgical cuts (incisions) were made.  Constipation. This can be caused by pain medicine and a decrease in your activity.  Follow these instructions at home: Medicines  Take over-the-counter and prescription medicines only as told by your health care provider.  Do not drive for 24 hours if you received a sedative.  Do not drive or operate heavy machinery while taking prescription pain medicine.  If you were prescribed an antibiotic medicine, take it as told by your health care provider. Do not stop taking the antibiotic even if you start to feel better. Activity  For 3 weeks or as long as told by your health care provider: ? Do not lift anything that is heavier than 10 pounds (4.5 kg). ? Do not play contact sports.  Gradually return to your normal activities. Ask your health care provider what activities are safe for you. Bathing  Keep your incisions clean and dry. Clean them as often as told by your health care provider: ? Gently wash the incisions with soap and water. ? Rinse the incisions with water to remove all soap. ? Pat the incisions dry with a clean towel. Do not rub the incisions.  You may take showers after 24 hours.  Do not take baths, swim, or use hot tubs for 2 weeks or as told by your health care provider. Incision care  Follow instructions from your healthcare provider about  how to take care of your incisions. Make sure you: ? Wash your hands with soap and water before you change your bandage (dressing). If soap and water are not available, use hand sanitizer. ? Change your dressing as told by your health care provider. Remove initial dressing in 48 hours. ? Leave stitches (sutures), skin glue, or adhesive strips in place. These skin closures may need to stay in place for 2 weeks or longer. If adhesive strip edges start to loosen and curl up, you may trim the loose edges. Do not remove adhesive strips completely unless your health care provider tells you to do that.  Check your incision areas every day for signs of infection. Check for: ? More redness, swelling, or pain. ? More fluid or blood. ? Warmth. ? Pus or a bad smell. Other Instructions  If you were sent home with a drain, follow instructions from your health care provider about how to care for the drain and how to empty it.  Take deep breaths. This helps to prevent your lungs from becoming inflamed.  To relieve and prevent constipation: ? Drink plenty of fluids. ? Eat plenty of fruits and vegetables.  Keep all follow-up visits as told by your health care provider. This is important. Contact a health care provider if:  You have more redness, swelling, or pain around an incision.  You have more fluid or blood coming from an incision.  Your incision feels warm to the touch.  You have pus or a bad smell coming from an  incision or dressing.  Your incision edges break open after your sutures have been removed.  You have increasing pain in your shoulders.  You feel dizzy or you faint.  You develop shortness of breath.  You keep feeling nauseous or vomiting.  You have diarrhea or you cannot control your bowel functions.  You lose your appetite.  You develop swelling or pain in your legs. Get help right away if:  You have a fever.  You develop a rash.  You have difficulty  breathing.  You have sharp pains in your chest. This information is not intended to replace advice given to you by your health care provider. Make sure you discuss any questions you have with your health care provider. Document Released: 03/22/2005 Document Revised: 08/22/2015 Document Reviewed: 09/09/2014 Elsevier Interactive Patient Education  2017 [employer].

## 2017-02-02 NOTE — Anesthesia Postprocedure Evaluation (Signed)
Anesthesia Post Note  Patient: Morgan Cisneros  Procedure(s) Performed: APPENDECTOMY LAPAROSCOPIC (N/A )  Patient location during evaluation: PACU Anesthesia Type: General Level of consciousness: awake and alert and oriented Pain management: pain level controlled Vital Signs Assessment: post-procedure vital signs reviewed and stable Respiratory status: spontaneous breathing Cardiovascular status: blood pressure returned to baseline Anesthetic complications: no     Last Vitals:  Vitals:   02/02/17 1320 02/02/17 1431  BP: 110/76 110/70  Pulse: 72 71  Resp: 17   Temp: 36.9 C 36.7 C  SpO2: 97% 98%    Last Pain:  Vitals:   02/02/17 1431  TempSrc: Oral  PainSc:                  Reeve Mallo

## 2017-02-02 NOTE — Op Note (Signed)
laparascopic appendectomy   Morgan Cisneros Date of operation:  02/02/2017  Indications: The patient presented with a history of  abdominal pain. Workup has revealed findings consistent with acute appendicitis.  Pre-operative Diagnosis: Acute appendicitis without mention of peritonitis  Post-operative Diagnosis: Same  Surgeon: Juanda Crumble T. Adonis Huguenin, MD, FACS  Anesthesia: General with endotracheal tube  Procedure Details  The patient was seen again in the preop area. The options of surgery versus observation were reviewed with the patient and/or family. The risks of bleeding, infection, recurrence of symptoms, negative laparoscopy, potential for an open procedure, bowel injury, abscess or infection, were all reviewed as well. The patient was taken to Operating Room, identified as Morgan Cisneros and the procedure verified as laparoscopic appendectomy. A Time Out was held and the above information confirmed.  The patient was placed in the supine position and general anesthesia was induced.  Antibiotic prophylaxis was administered and VT E prophylaxis was in place. A Foley catheter was placed by the nursing staff.   The abdomen was prepped and draped in a sterile fashion. An incision was made 2 fingerbreadths below the costal margin in the left upper quadrant at the midclavicular line. A Veress needle was placed and pneumoperitoneum was obtained. A 5 mm Optiview trocar port was placed without difficulty and the abdominal cavity was explored.  Midline adhesions were noted from her prior surgery, it appeared to be omentum.  Under direct vision a 12 mm left lateral port was placed under direct vision.  The midline adhesions were able be taken down bluntly.  A 5 mm infraumbilical trocar was then placed through a previous incision oozing the aforementioned technique.  The appendix was identified and found to be acutely inflamed in the retrocolic position.  Visualization was difficult with our  current trochars so an additional 5 mm trocar was placed in the left lower quadrant through a previous incision site.  The appendix was carefully dissected. The base of the appendix was dissected out and divided with a standard load Endo GIA. The mesoappendix was divided with a vascular load Endo GIA.  The mesoappendix was noted to be tortuous and adherent to the lateral sidewall of the colon.  Therefore a 5 mm blunt tipped LigaSure was brought up to the field and used to dissect out the remainder of the mesoappendix.  The appendix was passed out through the left lateral port site with the aid of an Endo Catch bag. The right lower quadrant and pelvis was then irrigated with copious amounts of normal saline which was aspirated. Inspection  failed to identify any additional bleeding and there were no signs of bowel injury. Therefore pneumoperitoneum was released and all ports were removed.  The left lower quadrant port site fascia was closed with a figure-of-eight 0 Vicryl suture and the skin incisions were approximated with subcuticular 4-0 Monocryl. Steri-Strips and Mastisol and sterile dressings were placed.  The patient tolerated the procedure well, there were no complications. The sponge lap and needle count were correct at the end of the procedure.  The patient was taken to the recovery room in stable condition to be admitted for continued care.  Findings: Acute appendicitis  Estimated Blood Loss: 10 mL                  Specimens: appendix         Complications:   None                  Clayburn Pert  MD, FACS

## 2017-02-02 NOTE — Brief Op Note (Signed)
02/01/2017 - 02/02/2017  11:41 AM  PATIENT:  [name]  51 y.o. female  PRE-OPERATIVE DIAGNOSIS:  appendicitis  POST-OPERATIVE DIAGNOSIS:  appendicitis  PROCEDURE:  Procedure(s): APPENDECTOMY LAPAROSCOPIC (N/A)  SURGEON:  Surgeon(s) and Role:    * Clayburn Pert, MD - Primary  PHYSICIAN ASSISTANT:   ASSISTANTS: none   ANESTHESIA:   general  EBL:  10 mL   BLOOD ADMINISTERED:none  DRAINS: none   LOCAL MEDICATIONS USED:  MARCAINE   , XYLOCAINE  and Amount: 26 ml  SPECIMEN:  Source of Specimen:  appendix  DISPOSITION OF SPECIMEN:  PATHOLOGY  COUNTS:  YES  TOURNIQUET:  * No tourniquets in log *  DICTATION: .Dragon Dictation  PLAN OF CARE: return to obs  PATIENT DISPOSITION:  PACU - hemodynamically stable.   Delay start of Pharmacological VTE agent (>24hrs) due to surgical blood loss or risk of bleeding: no

## 2017-02-02 NOTE — Transfer of Care (Signed)
Immediate Anesthesia Transfer of Care Note  Patient: Morgan Cisneros  Procedure(s) Performed: APPENDECTOMY LAPAROSCOPIC (N/A )  Patient Location: PACU  Anesthesia Type:General  Level of Consciousness: awake, alert  and oriented  Airway & Oxygen Therapy: patient spontaneous breathing   Post-op Assessment: Report given to RN and Post -op Vital signs reviewed and stable  Post vital signs: Reviewed and stable  Last Vitals:  Vitals:   02/02/17 0954 02/02/17 1154  BP:  119/73  Pulse:  77  Resp:  20  Temp: 36.7 C (!) 36.2 C  SpO2:  98%    Last Pain:  Vitals:   02/02/17 0954  TempSrc: Tympanic  PainSc:          Complications: No apparent anesthesia complications

## 2017-02-02 NOTE — Anesthesia Preprocedure Evaluation (Signed)
Anesthesia Evaluation  Patient identified by MRN, date of birth, ID band Patient awake    Reviewed: Allergy & Precautions, H&P , NPO status , Patient's Chart, lab work & pertinent test results, reviewed documented beta blocker date and time   History of Anesthesia Complications (+) Emergence Delirium  Airway Mallampati: III  TM Distance: <3 FB Neck ROM: full    Dental  (+) Teeth Intact, Dental Advidsory Given   Pulmonary neg shortness of breath, neg sleep apnea, neg COPD, neg recent URI, Current Smoker,    Pulmonary exam normal        Cardiovascular Exercise Tolerance: Good negative cardio ROS Normal cardiovascular exam     Neuro/Psych negative neurological ROS  negative psych ROS   GI/Hepatic Neg liver ROS, GERD  Medicated,diverticulitis   Endo/Other  negative endocrine ROS  Renal/GU Renal diseasestones Bladder dysfunction   negative genitourinary   Musculoskeletal  (+) Arthritis , Osteoarthritis,    Abdominal   Peds  Hematology negative hematology ROS (+)   Anesthesia Other Findings Past Medical History: No date: Abdominal pain 10/2016: C. difficile colitis 10/2016: Diverticulitis No date: Flank pain No date: Gross hematuria No date: History of kidney stones No date: Insomnia No date: Ovarian cyst No date: Renal calculus, right No date: Thoracic back pain     Comment:  Right sided  Reproductive/Obstetrics negative OB ROS                             Anesthesia Physical  Anesthesia Plan  ASA: III  Anesthesia Plan: General   Post-op Pain Management:    Induction: Intravenous  PONV Risk Score and Plan: 4 or greater and Ondansetron, Dexamethasone and Midazolam  Airway Management Planned: Oral ETT  Additional Equipment:   Intra-op Plan:   Post-operative Plan: Extubation in OR  Informed Consent: I have reviewed the patients History and Physical, chart, labs and  discussed the procedure including the risks, benefits and alternatives for the proposed anesthesia with the patient or authorized representative who has indicated his/her understanding and acceptance.   Dental Advisory Given  Plan Discussed with: CRNA  Anesthesia Plan Comments:         Anesthesia Quick Evaluation

## 2017-02-03 LAB — SURGICAL PATHOLOGY

## 2017-02-10 ENCOUNTER — Ambulatory Visit (INDEPENDENT_AMBULATORY_CARE_PROVIDER_SITE_OTHER): Payer: BC Managed Care – PPO | Admitting: General Surgery

## 2017-02-10 ENCOUNTER — Encounter: Payer: Self-pay | Admitting: General Surgery

## 2017-02-10 VITALS — BP 136/81 | HR 101 | Temp 98.1°F | Ht 65.0 in | Wt 138.2 lb

## 2017-02-10 DIAGNOSIS — Z4889 Encounter for other specified surgical aftercare: Secondary | ICD-10-CM

## 2017-02-10 NOTE — Progress Notes (Signed)
Outpatient Surgical Follow Up  02/10/2017  Morgan Cisneros is an 51 y.o. female.   Chief Complaint  Patient presents with  . Routine Post Op    Laparoscopic Appendectomy 02/02/17-Dr.Evelyna Folker    HPI: 51 year old female returns to clinic 1 week status post laparoscopic appendectomy.  Patient reports that she is feeling very good.  She denies any abdominal pain.  She states the crampy abdominal pains that she has been having for the last several months are now completely gone.  She denies any fevers, chills, nausea, vomiting, chest pain, shortness of breath, diarrhea, constipation.  Past Medical History:  Diagnosis Date  . Abdominal pain   . C. difficile colitis 10/2016  . Diverticulitis 10/2016  . Flank pain   . Gross hematuria   . History of kidney stones   . Insomnia   . Ovarian cyst   . Renal calculus, right   . Thoracic back pain    Right sided    Past Surgical History:  Procedure Laterality Date  . ABDOMINAL HYSTERECTOMY  08/2013  . ABDOMINAL HYSTERECTOMY    . BREAST BIOPSY Left 08/10/2016   [place] core  . CYSTOSCOPY  11/26/2010   with stent placement  . HEMORRHOID SURGERY    . kidney stent    . LITHOTRIPSY     X 4  . TONSILLECTOMY    . TUBAL LIGATION    . Ureteroscopy Right 11/21/1997   with holmium laser    Family History  Problem Relation Age of Onset  . Kidney Stones Mother   . Ovarian cancer Mother   . Colon cancer Mother   . Lung cancer Father   . Hyperlipidemia Sister   . Hypertension Sister   . Alzheimer's disease Maternal Grandmother   . Cancer Maternal Grandfather        Liver  . Alzheimer's disease Paternal Grandfather   . Hypertension Sister   . Hyperlipidemia Sister   . Breast cancer Neg Hx     Social History:  reports that she has been smoking cigarettes.  She has been smoking about 0.25 packs per day. she has never used smokeless tobacco. She reports that she drinks alcohol. She reports that she does not use drugs.  Allergies:   Allergies  Allergen Reactions  . Urocit-K [Potassium Citrate] Nausea Only    Medications reviewed.    ROS A multipoint review of systems was completed, all pertinent positives and negatives are documented within the HPI and the remainder are negative   BP 136/81   Pulse (!) 101   Temp 98.1 F (36.7 C) (Oral)   Ht 5\' 5"  (1.651 m)   Wt 62.7 kg (138 lb 3.2 oz)   BMI 23.00 kg/m   Physical Exam General: No acute distress Chest: Clear to auscultation  heart: Regular rate and rhythm Abdomen: Soft, nontender.  Well approximated laparoscopic incision sites without any evidence of erythema or drainage.    No results found for this or any previous visit (from the past 48 hour(s)). No results found.  Assessment/Plan:  1. Aftercare following surgery 51 year old female 1 week status post laparoscopic appendectomy.  Doing very well.  Provided with standard postoperative precautions.  Counseled her to wait at least another month before undergoing a colonoscopy as she just had abdominal surgery.  She will follow-up in clinic on an as-needed basis.     Clayburn Pert, MD FACS General Surgeon  02/10/2017,10:35 AM

## 2017-02-10 NOTE — Patient Instructions (Signed)

## 2017-02-11 ENCOUNTER — Ambulatory Visit
Admission: RE | Admit: 2017-02-11 | Discharge: 2017-02-11 | Disposition: A | Discharge status: Home / Self Care | Payer: BC Managed Care – PPO | Source: Ambulatory Visit | Attending: Family Medicine | Admitting: Family Medicine

## 2017-02-11 DIAGNOSIS — L748 Other eccrine sweat disorders: Secondary | ICD-10-CM | POA: Diagnosis present

## 2017-02-14 ENCOUNTER — Telehealth: Payer: Self-pay | Admitting: Family Medicine

## 2017-02-14 NOTE — Telephone Encounter (Signed)
Getting mammogram today.

## 2017-02-14 NOTE — Telephone Encounter (Signed)
-----   Message from Valerie Roys, [place] sent at 08/12/2016  1:06 PM EDT ----- Needs follow up mammogram

## 2017-03-01 ENCOUNTER — Ambulatory Visit (INDEPENDENT_AMBULATORY_CARE_PROVIDER_SITE_OTHER): Payer: BC Managed Care – PPO | Admitting: Family Medicine

## 2017-03-01 ENCOUNTER — Encounter: Payer: Self-pay | Admitting: Family Medicine

## 2017-03-01 VITALS — BP 106/73 | HR 90 | Temp 98.0°F | Wt 138.6 lb

## 2017-03-01 DIAGNOSIS — J069 Acute upper respiratory infection, unspecified: Secondary | ICD-10-CM | POA: Diagnosis not present

## 2017-03-01 MED ORDER — BENZONATATE 200 MG PO CAPS: 200.0000 mg | ORAL_CAPSULE | Freq: Two times a day (BID) | ORAL | 0 refills | Status: DC | PRN

## 2017-03-01 MED ORDER — PREDNISONE 20 MG PO TABS: 40.0000 mg | ORAL_TABLET | Freq: Every day | ORAL | 0 refills | Status: DC

## 2017-03-01 MED ORDER — HYDROCOD POLST-CPM POLST ER 10-8 MG/5ML PO SUER: 5.0000 mL | Freq: Two times a day (BID) | ORAL | 0 refills | Status: DC | PRN

## 2017-03-01 NOTE — Progress Notes (Signed)
   BP 106/73 (BP Location: Left Arm, Patient Position: Sitting, Cuff Size: Normal)   Pulse 90   Temp 98 F (36.7 C) (Oral)   Wt 138 lb 9.6 oz (62.9 kg)   SpO2 96%   BMI 23.06 kg/m    Subjective:    Patient ID: Morgan Cisneros, female    DOB: September 20, 1965, 51 y.o.   MRN: [id number]  HPI: Morgan Cisneros is a 51 y.o. female  Chief Complaint  Patient presents with  . Cough    x's 3 days. Pt states it's been going around in her family. Thinks she's getting bronchitis.   . Nasal Congestion   About 4-5 days of sore throat, congestion, productive cough, fatigue, body aches. Denies fevers, chills, sweats, CP, SOB. Taking sudafed, mucinex and ibuprofen with minimal relief. Lots of sick contacts. Current every day smoker.   Relevant past medical, surgical, family and social history reviewed and updated as indicated. Interim medical history since our last visit reviewed. Allergies and medications reviewed and updated.  Review of Systems  Constitutional: Positive for fatigue.  HENT: Positive for congestion and sore throat.   Respiratory: Positive for cough and chest tightness.   Cardiovascular: Negative.   Gastrointestinal: Negative.   Musculoskeletal: Positive for myalgias.  Neurological: Negative.   Psychiatric/Behavioral: Negative.     Per HPI unless specifically indicated above     Objective:    BP 106/73 (BP Location: Left Arm, Patient Position: Sitting, Cuff Size: Normal)   Pulse 90   Temp 98 F (36.7 C) (Oral)   Wt 138 lb 9.6 oz (62.9 kg)   SpO2 96%   BMI 23.06 kg/m   Wt Readings from Last 3 Encounters:  03/01/17 138 lb 9.6 oz (62.9 kg)  02/10/17 138 lb 3.2 oz (62.7 kg)  02/01/17 138 lb (62.6 kg)    Physical Exam  Constitutional: She is oriented to person, place, and time. She appears well-developed and well-nourished. No distress.  HENT:  Head: Atraumatic.  Right Ear: External ear normal.  Left Ear: External ear normal.  Oropharynx and nasal mucosa  erythematous Drainage present in nares  Eyes: Conjunctivae are normal. Pupils are equal, round, and reactive to light. No scleral icterus.  Neck: Normal range of motion. Neck supple.  Cardiovascular: Normal rate and normal heart sounds.  Pulmonary/Chest: Effort normal. No respiratory distress. She has wheezes.  Musculoskeletal: Normal range of motion.  Neurological: She is alert and oriented to person, place, and time.  Skin: Skin is warm and dry.  Psychiatric: She has a normal mood and affect. Her behavior is normal.  Nursing note and vitals reviewed.     Assessment & Plan:   Problem List Items Addressed This Visit    None    Visit Diagnoses    Upper respiratory tract infection, unspecified type    -  Primary   Will treat with prednisone taper, tessalon, and tussionex. Supportive care reviewed. Hoping to avoid abx given recent c diff colitis. F/u if no improvement       Follow up plan: Return if symptoms worsen or fail to improve.

## 2017-03-03 ENCOUNTER — Telehealth: Payer: Self-pay | Admitting: Family Medicine

## 2017-03-03 ENCOUNTER — Encounter: Payer: Self-pay | Admitting: Family Medicine

## 2017-03-03 MED ORDER — AMOXICILLIN-POT CLAVULANATE 875-125 MG PO TABS: 1.0000 | ORAL_TABLET | Freq: Two times a day (BID) | ORAL | 0 refills | Status: DC

## 2017-03-03 NOTE — Telephone Encounter (Signed)
Copied from [place] [id number]. Topic: Quick Communication - See Telephone Encounter >> Mar 03, 2017  1:05 PM Valla Leaver wrote: CRM for notification. See Telephone encounter for: 03/03/17. Requesting Augmentin for bronchitis and ear infection. VDPB:[id number]

## 2017-03-03 NOTE — Telephone Encounter (Signed)
Rx sent 

## 2017-03-03 NOTE — Telephone Encounter (Signed)
Copied from [place]. Topic: Quick Communication - See Telephone Encounter >> Mar 03, 2017  9:28 AM Ivar Drape wrote: CRM for notification. See Telephone encounter for:  03/03/17. Patient saw Merrie Roof on Tuesday and was told if she is not any better to call in and the doctor would call her in some anti-biotics.  Patient states she is not doing any better.

## 2017-03-03 NOTE — Patient Instructions (Signed)
Follow up as needed

## 2017-03-08 ENCOUNTER — Ambulatory Visit: Payer: BC Managed Care – PPO | Admitting: Family Medicine

## 2017-03-15 ENCOUNTER — Other Ambulatory Visit: Payer: Self-pay | Admitting: Family Medicine

## 2017-03-15 ENCOUNTER — Encounter: Payer: Self-pay | Admitting: Family Medicine

## 2017-03-15 MED ORDER — LORAZEPAM 1 MG PO TABS: 0.5000 mg | ORAL_TABLET | Freq: Two times a day (BID) | ORAL | 1 refills | Status: DC | PRN

## 2017-03-15 MED ORDER — DULOXETINE HCL 60 MG PO CPEP: 60.0000 mg | ORAL_CAPSULE | Freq: Every day | ORAL | 1 refills | Status: DC

## 2017-03-15 NOTE — Telephone Encounter (Signed)
Please call in her lorazepam. Thanks!

## 2017-03-17 ENCOUNTER — Encounter: Payer: Self-pay | Admitting: Family Medicine

## 2017-04-07 ENCOUNTER — Encounter: Payer: Self-pay | Admitting: Family Medicine

## 2017-04-07 MED ORDER — HYDROCORTISONE ACE-PRAMOXINE 1-1 % RE FOAM: 1.0000 | Freq: Two times a day (BID) | RECTAL | 3 refills | Status: DC

## 2017-04-18 ENCOUNTER — Ambulatory Visit (INDEPENDENT_AMBULATORY_CARE_PROVIDER_SITE_OTHER): Payer: BC Managed Care – PPO | Admitting: Family Medicine

## 2017-04-18 ENCOUNTER — Encounter: Payer: Self-pay | Admitting: Family Medicine

## 2017-04-18 VITALS — BP 122/84 | HR 92 | Temp 98.3°F | Wt 144.2 lb

## 2017-04-18 DIAGNOSIS — F419 Anxiety disorder, unspecified: Secondary | ICD-10-CM

## 2017-04-18 NOTE — Progress Notes (Signed)
BP 122/84 (BP Location: Left Arm, Patient Position: Sitting, Cuff Size: Normal)   Pulse 92   Temp 98.3 F (36.8 C)   Wt 144 lb 4 oz (65.4 kg)   SpO2 99%   BMI 23.28 kg/m    Subjective:    Patient ID: Morgan Cisneros, female    DOB: 1966-01-07, 52 y.o.   MRN: [id number]  HPI: Morgan Cisneros is a 52 y.o. female  Chief Complaint  Patient presents with  . Depression   DEPRESSION- tried to get off the lorazepam, had been taking 2mg  of lorazepam to sleep, tried to stop it and didn't sleep for 2 days Mood status: better Satisfied with current treatment?: yes Symptom severity: mild  Duration of current treatment : chronic Side effects: yes Medication compliance: excellent compliance Psychotherapy/counseling: no  Previous psychiatric medications: cymbalta Depressed mood: yes Anxious mood: yes Anhedonia: no Significant weight loss or gain: no Insomnia: yes hard to fall asleep Fatigue: yes Feelings of worthlessness or guilt: no Impaired concentration/indecisiveness: no Suicidal ideations: no Hopelessness: no Crying spells: no Depression screen [facility] 2/9 04/18/2017 07/08/2016 05/16/2015 04/18/2015  Decreased Interest 2 0 1 1  Down, Depressed, Hopeless 1 1 1  0  PHQ - 2 Score 3 1 2 1   Altered sleeping 1 - - -  Tired, decreased energy 2 - - -  Change in appetite 0 - - -  Feeling bad or failure about yourself  0 - - -  Trouble concentrating 3 - - -  Moving slowly or fidgety/restless 0 - - -  Suicidal thoughts 0 - - -  PHQ-9 Score 9 - - -   GAD 7 : Generalized Anxiety Score 04/18/2017 01/13/2017 06/04/2016 04/18/2015  Nervous, Anxious, on Edge 2 2 3 1   Control/stop worrying 2 2 3 3   Worry too much - different things 2 2 3 3   Trouble relaxing 3 2 3 2   Restless 1 1 3 2   Easily annoyed or irritable 0 1 3 3   Afraid - awful might happen 3 0 3 2  Total GAD 7 Score 13 10 21 16   Anxiety Difficulty Somewhat difficult Very difficult Very difficult Very difficult    Relevant past  medical, surgical, family and social history reviewed and updated as indicated. Interim medical history since our last visit reviewed. Allergies and medications reviewed and updated.  Review of Systems  Constitutional: Negative.   Respiratory: Negative.   Cardiovascular: Negative.   Psychiatric/Behavioral: Negative.     Per HPI unless specifically indicated above     Objective:    BP 122/84 (BP Location: Left Arm, Patient Position: Sitting, Cuff Size: Normal)   Pulse 92   Temp 98.3 F (36.8 C)   Wt 144 lb 4 oz (65.4 kg)   SpO2 99%   BMI 23.28 kg/m   Wt Readings from Last 3 Encounters:  04/18/17 144 lb 4 oz (65.4 kg)  03/01/17 138 lb 9.6 oz (62.9 kg)  02/10/17 138 lb 3.2 oz (62.7 kg)    Physical Exam  Constitutional: She is oriented to person, place, and time. She appears well-developed and well-nourished. No distress.  HENT:  Head: Normocephalic and atraumatic.  Right Ear: Hearing normal.  Left Ear: Hearing normal.  Nose: Nose normal.  Eyes: Conjunctivae and lids are normal. Right eye exhibits no discharge. Left eye exhibits no discharge. No scleral icterus.  Cardiovascular: Normal rate, regular rhythm, normal heart sounds and intact distal pulses. Exam reveals no gallop and no friction rub.  No murmur  heard. Pulmonary/Chest: Effort normal and breath sounds normal. No respiratory distress. She has no wheezes. She has no rales. She exhibits no tenderness.  Musculoskeletal: Normal range of motion.  Neurological: She is alert and oriented to person, place, and time.  Skin: Skin is warm, dry and intact. No rash noted. She is not diaphoretic. No erythema. No pallor.  Psychiatric: She has a normal mood and affect. Her speech is normal and behavior is normal. Judgment and thought content normal. Cognition and memory are normal.  Nursing note and vitals reviewed.   Results for orders placed or performed during the hospital encounter of 02/01/17  Lipase, blood  Result Value Ref  Range   Lipase 30 11 - 51 U/L  Comprehensive metabolic panel  Result Value Ref Range   Sodium 139 135 - 145 mmol/L   Potassium 3.7 3.5 - 5.1 mmol/L   Chloride 107 101 - 111 mmol/L   CO2 24 22 - 32 mmol/L   Glucose, Bld 88 65 - 99 mg/dL   BUN 15 6 - 20 mg/dL   Creatinine, Ser 0.63 0.44 - 1.00 mg/dL   Calcium 9.0 8.9 - 10.3 mg/dL   Total Protein 7.0 6.5 - 8.1 g/dL   Albumin 3.9 3.5 - 5.0 g/dL   AST 22 15 - 41 U/L   ALT 32 14 - 54 U/L   Alkaline Phosphatase 160 (H) 38 - 126 U/L   Total Bilirubin 0.6 0.3 - 1.2 mg/dL   GFR calc non Af Amer >60 >60 mL/min   GFR calc Af Amer >60 >60 mL/min   Anion gap 8 5 - 15  CBC  Result Value Ref Range   WBC 8.0 3.6 - 11.0 K/uL   RBC 4.43 3.80 - 5.20 MIL/uL   Hemoglobin 13.9 12.0 - 16.0 g/dL   HCT 41.9 35.0 - 47.0 %   MCV 94.5 80.0 - 100.0 fL   MCH 31.4 26.0 - 34.0 pg   MCHC 33.2 32.0 - 36.0 g/dL   RDW 14.8 (H) 11.5 - 14.5 %   Platelets 291 150 - 440 K/uL  Urinalysis, Complete w Microscopic  Result Value Ref Range   Color, Urine YELLOW (A) YELLOW   APPearance CLEAR (A) CLEAR   Specific Gravity, Urine 1.010 1.005 - 1.030   pH 6.0 5.0 - 8.0   Glucose, UA NEGATIVE NEGATIVE mg/dL   Hgb urine dipstick MODERATE (A) NEGATIVE   Bilirubin Urine NEGATIVE NEGATIVE   Ketones, ur NEGATIVE NEGATIVE mg/dL   Protein, ur NEGATIVE NEGATIVE mg/dL   Nitrite NEGATIVE NEGATIVE   Leukocytes, UA NEGATIVE NEGATIVE   RBC / HPF 0-5 0 - 5 RBC/hpf   WBC, UA 0-5 0 - 5 WBC/hpf   Bacteria, UA NONE SEEN NONE SEEN   Squamous Epithelial / LPF 0-5 (A) NONE SEEN   Mucus PRESENT   CBC  Result Value Ref Range   WBC 5.6 3.6 - 11.0 K/uL   RBC 3.93 3.80 - 5.20 MIL/uL   Hemoglobin 12.6 12.0 - 16.0 g/dL   HCT 37.3 35.0 - 47.0 %   MCV 95.1 80.0 - 100.0 fL   MCH 32.1 26.0 - 34.0 pg   MCHC 33.7 32.0 - 36.0 g/dL   RDW 14.6 (H) 11.5 - 14.5 %   Platelets 255 150 - 440 K/uL  Basic metabolic panel  Result Value Ref Range   Sodium 141 135 - 145 mmol/L   Potassium 3.4 (L) 3.5  - 5.1 mmol/L   Chloride 108 101 - 111 mmol/L  CO2 26 22 - 32 mmol/L   Glucose, Bld 131 (H) 65 - 99 mg/dL   BUN 15 6 - 20 mg/dL   Creatinine, Ser 0.71 0.44 - 1.00 mg/dL   Calcium 8.4 (L) 8.9 - 10.3 mg/dL   GFR calc non Af Amer >60 >60 mL/min   GFR calc Af Amer >60 >60 mL/min   Anion gap 7 5 - 15  Surgical pathology  Result Value Ref Range   SURGICAL PATHOLOGY      Surgical Pathology CASE: [id number] PATIENT: Nira Conn Surgical Pathology Report     SPECIMEN SUBMITTED: A. Appendix  CLINICAL HISTORY: None provided  PRE-OPERATIVE DIAGNOSIS: Appendicitis  POST-OPERATIVE DIAGNOSIS: Same as pre-op     DIAGNOSIS: A. APPENDIX; APPENDECTOMY: - EARLY ACUTE APPENDICITIS IN A BACKGROUND OF LYMPHOID HYPERPLASIA AND PERIAPPENDICEAL LYMPHOCYTIC INFLAMMATION, SUGGESTIVE OF CHRONIC APPENDICITIS.   GROSS DESCRIPTION: A. Labeled: appendix Size: 7.6 x 0.8 x 0.8 cm External surface: purple to tan focal ecchymosis Perforation: none grossly identified Fecalith: no Description: The proximal margin is inked blue. The specimen is filled with blood clot material.  Block summary: 1 - representative sections  Final Diagnosis performed by Bryan Lemma, MD.  Electronically signed 02/03/2017 5:59:18PM    The electronic signature indicates that the named Attending Pathologist has evaluated the specimen  Technical component pe rformed at [facility], [address] [place], [place] [address] Lab: [phone] Dir: Rush Farmer, MD, MMM  Professional component performed at [facility], [facility], [place], [place],  [address] Lab: [phone] Dir: Dellia Nims. Reuel Derby, MD        Assessment & Plan:   Problem List Items Addressed This Visit      Other   Anxiety - Primary    Under better control. Still having an issue with insomnia. Will decrease lorazepam to 1.5mg  qHS for 2 weeks, then 1mg  qHS for 2 weeks, then 0.5mg  after that and recheck.  Continue cymbalta. Continue to monitor.           Follow up plan: Return 6-8 weeks, for follow up sleep.

## 2017-04-18 NOTE — Assessment & Plan Note (Signed)
Under better control. Still having an issue with insomnia. Will decrease lorazepam to 1.5mg  qHS for 2 weeks, then 1mg  qHS for 2 weeks, then 0.5mg  after that and recheck. Continue cymbalta. Continue to monitor.

## 2017-04-22 NOTE — Discharge Instructions (Signed)
General Anesthesia, Adult, Care After °These instructions provide you with information about caring for yourself after your procedure. Your health care provider may also give you more specific instructions. Your treatment has been planned according to current medical practices, but problems sometimes occur. Call your health care provider if you have any problems or questions after your procedure. °What can I expect after the procedure? °After the procedure, it is common to have: °· Vomiting. °· A sore throat. °· Mental slowness. ° °It is common to feel: °· Nauseous. °· Cold or shivery. °· Sleepy. °· Tired. °· Sore or achy, even in parts of your body where you did not have surgery. ° °Follow these instructions at home: °For at least 24 hours after the procedure: °· Do not: °? Participate in activities where you could fall or become injured. °? Drive. °? Use heavy machinery. °? Drink alcohol. °? Take sleeping pills or medicines that cause drowsiness. °? Make important decisions or sign legal documents. °? Take care of children on your own. °· Rest. °Eating and drinking °· If you vomit, drink water, juice, or soup when you can drink without vomiting. °· Drink enough fluid to keep your urine clear or pale yellow. °· Make sure you have little or no nausea before eating solid foods. °· Follow the diet recommended by your health care provider. °General instructions °· Have a responsible adult stay with you until you are awake and alert. °· Return to your normal activities as told by your health care provider. Ask your health care provider what activities are safe for you. °· Take over-the-counter and prescription medicines only as told by your health care provider. °· If you smoke, do not smoke without supervision. °· Keep all follow-up visits as told by your health care provider. This is important. °Contact a health care provider if: °· You continue to have nausea or vomiting at home, and medicines are not helpful. °· You  cannot drink fluids or start eating again. °· You cannot urinate after 8-12 hours. °· You develop a skin rash. °· You have fever. °· You have increasing redness at the site of your procedure. °Get help right away if: °· You have difficulty breathing. °· You have chest pain. °· You have unexpected bleeding. °· You feel that you are having a life-threatening or urgent problem. °This information is not intended to replace advice given to you by your health care provider. Make sure you discuss any questions you have with your health care provider. °Document Released: 06/28/2000 Document Revised: 08/25/2015 Document Reviewed: 03/06/2015 °Elsevier Interactive Patient Education © 2018 [employer] ° °

## 2017-04-25 ENCOUNTER — Encounter
Admission: RE | Disposition: A | Discharge status: Home / Self Care | Payer: Self-pay | Source: Ambulatory Visit | Attending: Gastroenterology

## 2017-04-25 ENCOUNTER — Ambulatory Visit: Payer: BC Managed Care – PPO | Admitting: Anesthesiology

## 2017-04-25 ENCOUNTER — Ambulatory Visit
Admission: RE | Admit: 2017-04-25 | Discharge: 2017-04-25 | Disposition: A | Discharge status: Home / Self Care | Payer: BC Managed Care – PPO | Source: Ambulatory Visit | Attending: Gastroenterology | Admitting: Gastroenterology

## 2017-04-25 DIAGNOSIS — R194 Change in bowel habit: Secondary | ICD-10-CM

## 2017-04-25 DIAGNOSIS — R12 Heartburn: Secondary | ICD-10-CM | POA: Diagnosis not present

## 2017-04-25 DIAGNOSIS — K5730 Diverticulosis of large intestine without perforation or abscess without bleeding: Secondary | ICD-10-CM | POA: Diagnosis not present

## 2017-04-25 DIAGNOSIS — K648 Other hemorrhoids: Secondary | ICD-10-CM | POA: Diagnosis not present

## 2017-04-25 DIAGNOSIS — K2900 Acute gastritis without bleeding: Secondary | ICD-10-CM | POA: Diagnosis not present

## 2017-04-25 DIAGNOSIS — D122 Benign neoplasm of ascending colon: Secondary | ICD-10-CM | POA: Diagnosis not present

## 2017-04-25 DIAGNOSIS — K219 Gastro-esophageal reflux disease without esophagitis: Secondary | ICD-10-CM | POA: Insufficient documentation

## 2017-04-25 DIAGNOSIS — Z79899 Other long term (current) drug therapy: Secondary | ICD-10-CM | POA: Insufficient documentation

## 2017-04-25 DIAGNOSIS — K2970 Gastritis, unspecified, without bleeding: Secondary | ICD-10-CM | POA: Insufficient documentation

## 2017-04-25 DIAGNOSIS — F17210 Nicotine dependence, cigarettes, uncomplicated: Secondary | ICD-10-CM | POA: Insufficient documentation

## 2017-04-25 HISTORY — PX: COLONOSCOPY WITH PROPOFOL: SHX5780

## 2017-04-25 HISTORY — PX: POLYPECTOMY: SHX149

## 2017-04-25 HISTORY — PX: ESOPHAGOGASTRODUODENOSCOPY (EGD) WITH PROPOFOL: SHX5813

## 2017-04-25 SURGERY — COLONOSCOPY WITH PROPOFOL
Anesthesia: General | Wound class: Contaminated

## 2017-04-25 MED ORDER — SODIUM CHLORIDE 0.9 % IV SOLN
INTRAVENOUS | Status: DC
  Administered 2017-04-25 (×2): via INTRAVENOUS

## 2017-04-25 MED ORDER — LIDOCAINE HCL (CARDIAC) 20 MG/ML IV SOLN
INTRAVENOUS | Status: DC | PRN
  Administered 2017-04-25: 50 mg via INTRAVENOUS

## 2017-04-25 MED ORDER — SODIUM CHLORIDE 0.9 % IV SOLN: INTRAVENOUS | Status: DC

## 2017-04-25 MED ORDER — GLYCOPYRROLATE 0.2 MG/ML IJ SOLN
INTRAMUSCULAR | Status: DC | PRN
  Administered 2017-04-25: 0.2 mg via INTRAVENOUS

## 2017-04-25 MED ORDER — PROPOFOL 10 MG/ML IV BOLUS
INTRAVENOUS | Status: DC | PRN
  Administered 2017-04-25 (×8): 20 mg via INTRAVENOUS
  Administered 2017-04-25: 80 mg via INTRAVENOUS
  Administered 2017-04-25 (×2): 20 mg via INTRAVENOUS

## 2017-04-25 SURGICAL SUPPLY — 36 items
BALLN DILATOR 10-12 8 (BALLOONS)
BALLN DILATOR 12-15 8 (BALLOONS)
BALLN DILATOR 15-18 8 (BALLOONS)
BALLN DILATOR CRE 0-12 8 (BALLOONS)
BALLN DILATOR ESOPH 8 10 CRE (MISCELLANEOUS) IMPLANT
BALLOON DILATOR 12-15 8 (BALLOONS) IMPLANT
BALLOON DILATOR 15-18 8 (BALLOONS) IMPLANT
BALLOON DILATOR CRE 0-12 8 (BALLOONS) IMPLANT
BLOCK BITE 60FR ADLT L/F GRN (MISCELLANEOUS) ×3 IMPLANT
CANISTER SUCT 1200ML W/VALVE (MISCELLANEOUS) ×3 IMPLANT
CLIP HMST 235XBRD CATH ROT (MISCELLANEOUS) IMPLANT
CLIP RESOLUTION 360 11X235 (MISCELLANEOUS)
ELECT REM PT RETURN 9FT ADLT (ELECTROSURGICAL)
ELECTRODE REM PT RTRN 9FT ADLT (ELECTROSURGICAL) IMPLANT
FCP ESCP3.2XJMB 240X2.8X (MISCELLANEOUS)
FORCEPS BIOP RAD 4 LRG CAP 4 (CUTTING FORCEPS) ×3 IMPLANT
FORCEPS BIOP RJ4 240 W/NDL (MISCELLANEOUS)
FORCEPS ESCP3.2XJMB 240X2.8X (MISCELLANEOUS) IMPLANT
GOWN CVR UNV OPN BCK APRN NK (MISCELLANEOUS) ×4 IMPLANT
GOWN ISOL THUMB LOOP REG UNIV (MISCELLANEOUS) ×2
INJECTOR VARIJECT VIN23 (MISCELLANEOUS) IMPLANT
KIT DEFENDO VALVE AND CONN (KITS) IMPLANT
KIT ENDO PROCEDURE OLY (KITS) ×3 IMPLANT
MARKER SPOT ENDO TATTOO 5ML (MISCELLANEOUS) IMPLANT
PROBE APC STR FIRE (PROBE) IMPLANT
RETRIEVER NET PLAT FOOD (MISCELLANEOUS) IMPLANT
RETRIEVER NET ROTH 2.5X230 LF (MISCELLANEOUS) IMPLANT
SNARE SHORT THROW 13M SML OVAL (MISCELLANEOUS) ×3 IMPLANT
SNARE SHORT THROW 30M LRG OVAL (MISCELLANEOUS) IMPLANT
SNARE SNG USE RND 15MM (INSTRUMENTS) IMPLANT
SPOT EX ENDOSCOPIC TATTOO (MISCELLANEOUS)
SYR INFLATION 60ML (SYRINGE) IMPLANT
TRAP ETRAP POLY (MISCELLANEOUS) ×3 IMPLANT
VARIJECT INJECTOR VIN23 (MISCELLANEOUS)
WATER STERILE IRR 250ML POUR (IV SOLUTION) ×3 IMPLANT
WIRE CRE 18-20MM 8CM F G (MISCELLANEOUS) IMPLANT

## 2017-04-25 NOTE — Anesthesia Procedure Notes (Signed)
Performed by: Caley Ciaramitaro, CRNA Pre-anesthesia Checklist: Patient identified, Emergency Drugs available, Suction available, Timeout performed and Patient being monitored Patient Re-evaluated:Patient Re-evaluated prior to induction Oxygen Delivery Method: Nasal cannula Placement Confirmation: positive ETCO2       

## 2017-04-25 NOTE — Anesthesia Preprocedure Evaluation (Addendum)
Anesthesia Evaluation   Patient awake    Reviewed: Allergy & Precautions, H&P , NPO status , Patient's Chart, lab work & pertinent test results  Airway Mallampati: II  TM Distance: >3 FB Neck ROM: Full    Dental no notable dental hx.    Pulmonary Current Smoker,    Pulmonary exam normal        Cardiovascular negative cardio ROS Normal cardiovascular exam     Neuro/Psych negative neurological ROS     GI/Hepatic Neg liver ROS, Medicated,  Endo/Other  negative endocrine ROS  Renal/GU   negative genitourinary   Musculoskeletal   Abdominal   Peds  Hematology negative hematology ROS (+)   Anesthesia Other Findings   Reproductive/Obstetrics                            Anesthesia Physical Anesthesia Plan  ASA: II  Anesthesia Plan: General   Post-op Pain Management:    Induction:   PONV Risk Score and Plan:   Airway Management Planned:   Additional Equipment:   Intra-op Plan:   Post-operative Plan:   Informed Consent: I have reviewed the patients History and Physical, chart, labs and discussed the procedure including the risks, benefits and alternatives for the proposed anesthesia with the patient or authorized representative who has indicated his/her understanding and acceptance.     Plan Discussed with:   Anesthesia Plan Comments:         Anesthesia Quick Evaluation

## 2017-04-25 NOTE — Op Note (Signed)
[facility] Patient Name: Morgan Cisneros Procedure Date: 04/25/2017 9:52 AM MRN: [id number] Account #: [id number] Date of Birth: February 14, 1966 Admit Type: Outpatient Age: 52 Room: [facility] OR ROOM 01 Gender: Female Note Status: Finalized Procedure:            Colonoscopy Indications:          Change in bowel habits Providers:            Lucilla Lame MD, MD Referring MD:         Valerie Roys (Referring MD) Medicines:            Propofol per Anesthesia Complications:        No immediate complications. Procedure:            Pre-Anesthesia Assessment:                       - Prior to the procedure, a History and Physical was                        performed, and patient medications and allergies were                        reviewed. The patient's tolerance of previous                        anesthesia was also reviewed. The risks and benefits of                        the procedure and the sedation options and risks were                        discussed with the patient. All questions were                        answered, and informed consent was obtained. Prior                        Anticoagulants: The patient has taken no previous                        anticoagulant or antiplatelet agents. ASA Grade                        Assessment: II - A patient with mild systemic disease.                        After reviewing the risks and benefits, the patient was                        deemed in satisfactory condition to undergo the                        procedure.                       After obtaining informed consent, the colonoscope was                        passed under direct vision. Throughout the procedure,  the patient's blood pressure, pulse, and oxygen                        saturations were monitored continuously. The Olympus CF                        H180AL Colonoscope (S#: [id number]) was introduced through     the anus and advanced to the the cecum, identified by                        appendiceal orifice and ileocecal valve. The                        colonoscopy was performed without difficulty. The                        patient tolerated the procedure well. The quality of                        the bowel preparation was excellent. Findings:      The perianal and digital rectal examinations were normal.      A few small-mouthed diverticula were found in the entire colon.      There was evidence of a prior end-to-end colo-colonic anastomosis in the       rectum and in the descending colon. This was patent and was       characterized by healthy appearing mucosa. The anastomosis was traversed.      Non-bleeding internal hemorrhoids were found during retroflexion. The       hemorrhoids were Grade II (internal hemorrhoids that prolapse but reduce       spontaneously).      A 4 mm polyp was found in the ascending colon. The polyp was sessile.       The polyp was removed with a cold biopsy forceps. Resection and       retrieval were complete.      A 7 mm polyp was found in the ascending colon. The polyp was sessile.       The polyp was removed with a cold snare. Resection and retrieval were       complete. Impression:           - Diverticulosis in the entire examined colon.                       - Patent end-to-end colo-colonic anastomosis,                        characterized by healthy appearing mucosa.                       - Non-bleeding internal hemorrhoids.                       - One 4 mm polyp in the ascending colon, removed with a                        cold biopsy forceps. Resected and retrieved.                       - One 7 mm polyp in the ascending colon, removed with a  cold snare. Resected and retrieved. Recommendation:       - Await pathology results.                       - Discharge patient to home.                       - Resume previous diet.                        - Continue present medications.                       - Repeat colonoscopy in 5 years if polyp adenoma and 10                        years if hyperplastic Procedure Code(s):    --- Professional ---                       [phone], Colonoscopy, flexible; with removal of tumor(s),                        polyp(s), or other lesion(s) by snare technique                       [id number], 31, Colonoscopy, flexible; with biopsy, single                        or multiple Diagnosis Code(s):    --- Professional ---                       R19.4, Change in bowel habit                       D12.2, Benign neoplasm of ascending colon CPT copyright 2016 American Medical Association. All rights reserved. The codes documented in this report are preliminary and upon coder review may  be revised to meet current compliance requirements. Lucilla Lame MD, MD 04/25/2017 10:16:45 AM This report has been signed electronically. Number of Addenda: 0 Note Initiated On: 04/25/2017 9:52 AM Scope Withdrawal Time: 0 hours 6 minutes 51 seconds  Total Procedure Duration: 0 hours 8 minutes 51 seconds       [facility]

## 2017-04-25 NOTE — Anesthesia Postprocedure Evaluation (Signed)
Anesthesia Post Note  Patient: Morgan Cisneros  Procedure(s) Performed: COLONOSCOPY WITH PROPOFOL (N/A ) ESOPHAGOGASTRODUODENOSCOPY (EGD) WITH PROPOFOL (N/A ) POLYPECTOMY INTESTINAL  Patient location during evaluation: PACU Anesthesia Type: General Level of consciousness: awake and alert Pain management: pain level controlled Vital Signs Assessment: post-procedure vital signs reviewed and stable Respiratory status: spontaneous breathing Cardiovascular status: blood pressure returned to baseline Postop Assessment: no headache Anesthetic complications: no    Jaci Standard, III,  Deleon Passe D

## 2017-04-25 NOTE — Op Note (Signed)
[facility] Patient Name: Morgan Cisneros Procedure Date: 04/25/2017 9:51 AM MRN: [id number] Account #: [id number] Date of Birth: 1966/02/27 Admit Type: Outpatient Age: 52 Room: [facility] OR ROOM 01 Gender: Female Note Status: Finalized Procedure:            Upper GI endoscopy Indications:          Heartburn Providers:            Lucilla Lame MD, MD Referring MD:         Valerie Roys (Referring MD) Medicines:            Propofol per Anesthesia Complications:        No immediate complications. Procedure:            Pre-Anesthesia Assessment:                       - Prior to the procedure, a History and Physical was                        performed, and patient medications and allergies were                        reviewed. The patient's tolerance of previous                        anesthesia was also reviewed. The risks and benefits of                        the procedure and the sedation options and risks were                        discussed with the patient. All questions were                        answered, and informed consent was obtained. Prior                        Anticoagulants: The patient has taken no previous                        anticoagulant or antiplatelet agents. ASA Grade                        Assessment: II - A patient with mild systemic disease.                        After reviewing the risks and benefits, the patient was                        deemed in satisfactory condition to undergo the                        procedure.                       After obtaining informed consent, the endoscope was                        passed under direct vision. Throughout the procedure,  the patient's blood pressure, pulse, and oxygen                        saturations were monitored continuously. The Olympus                        [id number]-HQ190 Endoscope (S#. [id number]) was introduced                        through the  mouth, and advanced to the second part of                        duodenum. The upper GI endoscopy was accomplished                        without difficulty. The patient tolerated the procedure                        well. Findings:      The Z-line was irregular and was found at the gastroesophageal junction.      Localized mild inflammation characterized by erythema was found in the       gastric antrum. Biopsies were taken with a cold forceps for histology.      The examined duodenum was normal. Impression:           - Z-line irregular, at the gastroesophageal junction.                       - Gastritis. Biopsied.                       - Normal examined duodenum. Recommendation:       - Discharge patient to home.                       - Resume previous diet.                       - Continue present medications.                       - Await pathology results. Procedure Code(s):    --- Professional ---                       [phone], Esophagogastroduodenoscopy, flexible, transoral;                        with biopsy, single or multiple Diagnosis Code(s):    --- Professional ---                       R12, Heartburn                       K29.70, Gastritis, unspecified, without bleeding CPT copyright 2016 American Medical Association. All rights reserved. The codes documented in this report are preliminary and upon coder review may  be revised to meet current compliance requirements. Lucilla Lame MD, MD 04/25/2017 10:00:54 AM This report has been signed electronically. Number of Addenda: 0 Note Initiated On: 04/25/2017 9:51 AM      [facility]

## 2017-04-25 NOTE — Transfer of Care (Signed)
Immediate Anesthesia Transfer of Care Note  Patient: Morgan Cisneros  Procedure(s) Performed: COLONOSCOPY WITH PROPOFOL (N/A ) ESOPHAGOGASTRODUODENOSCOPY (EGD) WITH PROPOFOL (N/A ) POLYPECTOMY INTESTINAL  Patient Location: PACU  Anesthesia Type: General  Level of Consciousness: awake, alert  and patient cooperative  Airway and Oxygen Therapy: Patient Spontanous Breathing and Patient connected to supplemental oxygen  Post-op Assessment: Post-op Vital signs reviewed, Patient's Cardiovascular Status Stable, Respiratory Function Stable, Patent Airway and No signs of Nausea or vomiting  Post-op Vital Signs: Reviewed and stable  Complications: No apparent anesthesia complications

## 2017-04-25 NOTE — H&P (Signed)
Morgan Lame, MD [facility] [address], [place], [place] [address] [phone] Fax : [phone]  Primary Care Physician:  Valerie Roys, DO Primary Gastroenterologist:  Dr. Allen Norris  Pre-Procedure History & Physical: HPI:  Morgan Cisneros is a 52 y.o. female is here for an endoscopy and colonoscopy.   Past Medical History:  Diagnosis Date  . Abdominal pain   . C. difficile colitis 10/2016  . Diverticulitis 10/2016  . Flank pain   . Gross hematuria   . History of kidney stones   . Insomnia   . Ovarian cyst   . Renal calculus, right   . Thoracic back pain    Right sided    Past Surgical History:  Procedure Laterality Date  . ABDOMINAL HYSTERECTOMY  08/2013  . ABDOMINAL HYSTERECTOMY    . BREAST BIOPSY Left 08/10/2016   [place] core  . CYSTOSCOPY  11/26/2010   with stent placement  . HEMORRHOID SURGERY    . kidney stent    . LAPAROSCOPIC APPENDECTOMY N/A 02/02/2017   Procedure: APPENDECTOMY LAPAROSCOPIC;  Surgeon: Clayburn Pert, MD;  Location: [facility] ORS;  Service: General;  Laterality: N/A;  . LAPAROSCOPIC SIGMOID COLECTOMY N/A 11/01/2016   Procedure: LAPAROSCOPIC SIGMOID COLECTOMY;  Surgeon: Clayburn Pert, MD;  Location: [facility] ORS;  Service: General;  Laterality: N/A;  . LITHOTRIPSY     X 4  . TONSILLECTOMY    . TUBAL LIGATION    . Ureteroscopy Right 11/21/1997   with holmium laser    Prior to Admission medications   Medication Sig Start Date End Date Taking? Authorizing Provider  DULoxetine (CYMBALTA) 60 MG capsule Take 1 capsule (60 mg total) by mouth daily. 03/15/17  Yes Johnson, Megan P, DO  esomeprazole (NEXIUM) 20 MG capsule daily as needed.  02/14/17  Yes [provider]  hydrocortisone-pramoxine (PROCTOFOAM-HC) rectal foam Place 1 applicator rectally 2 (two) times daily. 04/07/17  Yes Johnson, Megan P, DO  LORazepam (ATIVAN) 1 MG tablet Take 0.5-1 tablets (0.5-1 mg total) by mouth 2 (two) times daily as needed. for anxiety 03/15/17   Yes Johnson, Megan P, DO  sucralfate (CARAFATE) 1 g tablet daily as needed.  02/11/17   [provider]    Allergies as of 01/25/2017 - Review Complete 01/24/2017  Allergen Reaction Noted  . Urocit-k [potassium citrate] Nausea Only 10/22/2014    Family History  Problem Relation Age of Onset  . Kidney Stones Mother   . Ovarian cancer Mother   . Colon cancer Mother   . Lung cancer Father   . Hyperlipidemia Sister   . Hypertension Sister   . Alzheimer's disease Maternal Grandmother   . Cancer Maternal Grandfather        Liver  . Alzheimer's disease Paternal Grandfather   . Hypertension Sister   . Hyperlipidemia Sister   . Breast cancer Neg Hx     Social History   Socioeconomic History  . Marital status: Married    Spouse name: Not on file  . Number of children: Not on file  . Years of education: Not on file  . Highest education level: Not on file  Social Needs  . Financial resource strain: Not on file  . Food insecurity - worry: Not on file  . Food insecurity - inability: Not on file  . Transportation needs - medical: Not on file  . Transportation needs - non-medical: Not on file  Occupational History  . Not on file  Tobacco Use  . Smoking status: Current Every  Day Smoker    Packs/day: 0.25    Types: Cigarettes  . Smokeless tobacco: Never Used  Substance and Sexual Activity  . Alcohol use: Yes    Alcohol/week: 0.0 oz    Comment: occasional  . Drug use: No  . Sexual activity: Yes    Birth control/protection: Surgical  Other Topics Concern  . Not on file  Social History Narrative  . Not on file    Review of Systems: See HPI, otherwise negative ROS  Physical Exam: Ht 5\' 6"  (1.676 m)   Wt 140 lb (63.5 kg)   BMI 22.60 kg/m  General:   Alert,  pleasant and cooperative in NAD Head:  Normocephalic and atraumatic. Neck:  Supple; no masses or thyromegaly. Lungs:  Clear throughout to auscultation.    Heart:  Regular rate and rhythm. Abdomen:  Soft,  nontender and nondistended. Normal bowel sounds, without guarding, and without rebound.   Neurologic:  Alert and  oriented x4;  grossly normal neurologically.  Impression/Plan: [name] is here for an endoscopy and colonoscopy to be performed for change in bowel habits and GERD   Risks, benefits, limitations, and alternatives regarding  endoscopy and colonoscopy have been reviewed with the patient.  Questions have been answered.  All parties agreeable.   Morgan Lame, MD  04/25/2017, 8:47 AM

## 2017-05-09 ENCOUNTER — Telehealth: Payer: Self-pay | Admitting: Gastroenterology

## 2017-05-09 NOTE — Telephone Encounter (Signed)
Patient called & l/m on answering machine and would like the results for the upper & lower endoscopy done 04-25-17 by Dr Allen Norris. Please call [phone] of [phone].

## 2017-05-10 ENCOUNTER — Encounter: Payer: Self-pay | Admitting: Gastroenterology

## 2017-05-10 NOTE — Telephone Encounter (Signed)
Please review pathology and advise.  

## 2017-05-11 ENCOUNTER — Encounter: Payer: Self-pay | Admitting: Family Medicine

## 2017-05-11 ENCOUNTER — Encounter: Payer: Self-pay | Admitting: Gastroenterology

## 2017-05-11 MED ORDER — LORAZEPAM 1 MG PO TABS: 0.5000 mg | ORAL_TABLET | Freq: Two times a day (BID) | ORAL | 0 refills | Status: DC | PRN

## 2017-05-11 NOTE — Telephone Encounter (Signed)
Please call in her lorazepam

## 2017-05-11 NOTE — Telephone Encounter (Signed)
Medication called into [employer] and left on the answering machine.

## 2017-05-27 ENCOUNTER — Other Ambulatory Visit: Payer: Self-pay | Admitting: Family Medicine

## 2017-05-27 DIAGNOSIS — Z1231 Encounter for screening mammogram for malignant neoplasm of breast: Secondary | ICD-10-CM

## 2017-06-09 ENCOUNTER — Ambulatory Visit (INDEPENDENT_AMBULATORY_CARE_PROVIDER_SITE_OTHER): Payer: BC Managed Care – PPO | Admitting: Family Medicine

## 2017-06-09 ENCOUNTER — Encounter: Payer: Self-pay | Admitting: Family Medicine

## 2017-06-09 VITALS — BP 123/81 | HR 86 | Temp 98.2°F | Wt 146.5 lb

## 2017-06-09 DIAGNOSIS — R300 Dysuria: Secondary | ICD-10-CM | POA: Diagnosis not present

## 2017-06-09 DIAGNOSIS — G4700 Insomnia, unspecified: Secondary | ICD-10-CM

## 2017-06-09 MED ORDER — CLONAZEPAM 1 MG PO TABS: 0.5000 mg | ORAL_TABLET | Freq: Two times a day (BID) | ORAL | 1 refills | Status: DC | PRN

## 2017-06-09 NOTE — Assessment & Plan Note (Signed)
Not under great control with the lorazepam. Will change to klonopin and recheck in 2 months. Call with any concerns.

## 2017-06-09 NOTE — Progress Notes (Signed)
BP 123/81 (BP Location: Left Arm, Patient Position: Sitting, Cuff Size: Normal)   Pulse 86   Temp 98.2 F (36.8 C) (Oral)   Wt 146 lb 8 oz (66.5 kg)   SpO2 99%   BMI 23.65 kg/m    Subjective:    Patient ID: Morgan Cisneros, female    DOB: Aug 19, 1965, 52 y.o.   MRN: [id number]  HPI: Morgan Cisneros is a 52 y.o. female  Chief Complaint  Patient presents with  . Insomnia    follow up   INSOMNIA- has been taking 1.5mg  of her ativan  Duration: chronic Satisfied with sleep quality: no Difficulty falling asleep: no Difficulty staying asleep: yes Waking a few hours after sleep onset: yes Early morning awakenings: no Daytime hypersomnolence: no Wakes feeling refreshed: yes Good sleep hygiene: no Apnea: no Snoring: no Depressed/anxious mood: yes Recent stress: yes Restless legs/nocturnal leg cramps: no Chronic pain/arthritis: no History of sleep study: no Treatments attempted: lorazepam, melatonin, uinsom, benadryl and ambien   Relevant past medical, surgical, family and social history reviewed and updated as indicated. Interim medical history since our last visit reviewed. Allergies and medications reviewed and updated.  Review of Systems  Constitutional: Negative.   Respiratory: Negative.   Cardiovascular: Negative.   Psychiatric/Behavioral: Positive for sleep disturbance. Negative for agitation, behavioral problems, confusion, decreased concentration, dysphoric mood, hallucinations, self-injury and suicidal ideas. The patient is not nervous/anxious and is not hyperactive.     Per HPI unless specifically indicated above     Objective:    BP 123/81 (BP Location: Left Arm, Patient Position: Sitting, Cuff Size: Normal)   Pulse 86   Temp 98.2 F (36.8 C) (Oral)   Wt 146 lb 8 oz (66.5 kg)   SpO2 99%   BMI 23.65 kg/m   Wt Readings from Last 3 Encounters:  06/09/17 146 lb 8 oz (66.5 kg)  04/25/17 138 lb (62.6 kg)  04/18/17 144 lb 4 oz (65.4 kg)    Physical  Exam  Constitutional: She is oriented to person, place, and time. She appears well-developed and well-nourished. No distress.  HENT:  Head: Normocephalic and atraumatic.  Right Ear: Hearing normal.  Left Ear: Hearing normal.  Nose: Nose normal.  Eyes: Conjunctivae and lids are normal. Right eye exhibits no discharge. Left eye exhibits no discharge. No scleral icterus.  Cardiovascular: Normal rate, regular rhythm, normal heart sounds and intact distal pulses. Exam reveals no gallop and no friction rub.  No murmur heard. Pulmonary/Chest: Effort normal and breath sounds normal. No respiratory distress. She has no wheezes. She has no rales. She exhibits no tenderness.  Musculoskeletal: Normal range of motion.  Neurological: She is alert and oriented to person, place, and time.  Skin: Skin is warm, dry and intact. No rash noted. She is not diaphoretic. No erythema. No pallor.  Psychiatric: She has a normal mood and affect. Her speech is normal and behavior is normal. Judgment and thought content normal. Cognition and memory are normal.  Nursing note and vitals reviewed.     Assessment & Plan:   Problem List Items Addressed This Visit      Other   Insomnia - Primary    Not under great control with the lorazepam. Will change to klonopin and recheck in 2 months. Call with any concerns.        Other Visit Diagnoses    Dysuria       UA negative- push fluids, call if not better in 4 days.  Relevant Orders   UA/M w/rflx Culture, Routine       Follow up plan: Return in about 2 months (around 08/09/2017) for follow up sleep.

## 2017-06-10 LAB — UA/M W/RFLX CULTURE, ROUTINE
BILIRUBIN UA: NEGATIVE
GLUCOSE, UA: NEGATIVE
KETONES UA: NEGATIVE
Leukocytes, UA: NEGATIVE
Nitrite, UA: NEGATIVE
PROTEIN UA: NEGATIVE
SPEC GRAV UA: 1.025 (ref 1.005–1.030)
UUROB: 0.2 mg/dL (ref 0.2–1.0)
pH, UA: 6.5 (ref 5.0–7.5)

## 2017-06-10 LAB — MICROSCOPIC EXAMINATION

## 2017-07-28 ENCOUNTER — Ambulatory Visit
Admission: RE | Admit: 2017-07-28 | Discharge: 2017-07-28 | Disposition: A | Discharge status: Home / Self Care | Payer: BC Managed Care – PPO | Source: Ambulatory Visit | Attending: Family Medicine | Admitting: Family Medicine

## 2017-07-28 DIAGNOSIS — Z1231 Encounter for screening mammogram for malignant neoplasm of breast: Secondary | ICD-10-CM

## 2017-08-08 ENCOUNTER — Encounter: Payer: Self-pay | Admitting: Family Medicine

## 2017-08-08 ENCOUNTER — Other Ambulatory Visit: Payer: Self-pay | Admitting: Family Medicine

## 2017-08-08 DIAGNOSIS — R197 Diarrhea, unspecified: Secondary | ICD-10-CM

## 2017-08-09 ENCOUNTER — Encounter: Payer: Self-pay | Admitting: Family Medicine

## 2017-08-11 ENCOUNTER — Encounter: Payer: Self-pay | Admitting: Family Medicine

## 2017-08-11 ENCOUNTER — Ambulatory Visit (INDEPENDENT_AMBULATORY_CARE_PROVIDER_SITE_OTHER): Payer: BC Managed Care – PPO | Admitting: Family Medicine

## 2017-08-11 VITALS — BP 102/70 | HR 81 | Temp 98.2°F | Ht 62.25 in | Wt 148.4 lb

## 2017-08-11 DIAGNOSIS — Z13220 Encounter for screening for lipoid disorders: Secondary | ICD-10-CM

## 2017-08-11 DIAGNOSIS — N951 Menopausal and female climacteric states: Secondary | ICD-10-CM | POA: Diagnosis not present

## 2017-08-11 DIAGNOSIS — G4700 Insomnia, unspecified: Secondary | ICD-10-CM | POA: Diagnosis not present

## 2017-08-11 DIAGNOSIS — N393 Stress incontinence (female) (male): Secondary | ICD-10-CM | POA: Diagnosis not present

## 2017-08-11 DIAGNOSIS — F17210 Nicotine dependence, cigarettes, uncomplicated: Secondary | ICD-10-CM

## 2017-08-11 DIAGNOSIS — Z0000 Encounter for general adult medical examination without abnormal findings: Secondary | ICD-10-CM

## 2017-08-11 DIAGNOSIS — K219 Gastro-esophageal reflux disease without esophagitis: Secondary | ICD-10-CM | POA: Diagnosis not present

## 2017-08-11 DIAGNOSIS — Z720 Tobacco use: Secondary | ICD-10-CM

## 2017-08-11 DIAGNOSIS — F419 Anxiety disorder, unspecified: Secondary | ICD-10-CM | POA: Diagnosis not present

## 2017-08-11 LAB — UA/M W/RFLX CULTURE, ROUTINE
Bilirubin, UA: NEGATIVE
Glucose, UA: NEGATIVE
Ketones, UA: NEGATIVE
LEUKOCYTES UA: NEGATIVE
NITRITE UA: NEGATIVE
PH UA: 7 (ref 5.0–7.5)
Protein, UA: NEGATIVE
SPEC GRAV UA: 1.015 (ref 1.005–1.030)
Urobilinogen, Ur: 0.2 mg/dL (ref 0.2–1.0)

## 2017-08-11 LAB — MICROSCOPIC EXAMINATION

## 2017-08-11 MED ORDER — ESOMEPRAZOLE MAGNESIUM 20 MG PO CPDR
20.0000 mg | DELAYED_RELEASE_CAPSULE | Freq: Every day | ORAL | 1 refills | Status: DC
Start: 2017-08-11 — End: 2017-11-11

## 2017-08-11 MED ORDER — DULOXETINE HCL 60 MG PO CPEP: 60.0000 mg | ORAL_CAPSULE | Freq: Every day | ORAL | 1 refills | Status: DC

## 2017-08-11 MED ORDER — CLONAZEPAM 1 MG PO TABS: 0.5000 mg | ORAL_TABLET | Freq: Two times a day (BID) | ORAL | 2 refills | Status: DC | PRN

## 2017-08-11 NOTE — Assessment & Plan Note (Signed)
Under good control on current regimen. Continue current regimen. Continue to monitor. Call with any concerns.   

## 2017-08-11 NOTE — Assessment & Plan Note (Signed)
Continue current regimen. Continue to monitor. Call with any concerns. Refills given today.

## 2017-08-11 NOTE — Assessment & Plan Note (Signed)
Stable. Still dealing with her father's death. Continue to monitor. Call with any concerns.

## 2017-08-11 NOTE — Assessment & Plan Note (Signed)
Well controlled on estroven. Call with any concerns. Continue to monitor.

## 2017-08-11 NOTE — Progress Notes (Signed)
BP 102/70 (BP Location: Left Arm, Patient Position: Sitting, Cuff Size: Normal)   Pulse 81   Temp 98.2 F (36.8 C) (Oral)   Ht 5' 2.25" (1.581 m)   Wt 148 lb 6.4 oz (67.3 kg)   SpO2 98%   BMI 26.93 kg/m    Subjective:    Patient ID: Morgan Cisneros, female    DOB: [birth year], 52 y.o.   MRN: [id number]  HPI: Morgan Cisneros is a 52 y.o. female presenting on 08/11/2017 for comprehensive medical examination. Current medical complaints include:  ANXIETY/STRESS Duration:better Anxious mood: yes  Excessive worrying: yes Irritability: yes  Sweating: no Nausea: no Palpitations:no Hyperventilation: no Panic attacks: no Agoraphobia: no  Obscessions/compulsions: no Depressed mood: yes Depression screen [facility] 2/9 08/11/2017 04/18/2017 07/08/2016 05/16/2015 04/18/2015  Decreased Interest 1 2 0 1 1  Down, Depressed, Hopeless 1 1 1 1  0  PHQ - 2 Score 2 3 1 2 1   Altered sleeping 1 1 - - -  Tired, decreased energy 1 2 - - -  Change in appetite 0 0 - - -  Feeling bad or failure about yourself  1 0 - - -  Trouble concentrating 1 3 - - -  Moving slowly or fidgety/restless 0 0 - - -  Suicidal thoughts 0 0 - - -  PHQ-9 Score 6 9 - - -   GAD 7 : Generalized Anxiety Score 08/11/2017 04/18/2017 01/13/2017 06/04/2016  Nervous, Anxious, on Edge 1 2 2 3   Control/stop worrying 2 2 2 3   Worry too much - different things 2 2 2 3   Trouble relaxing 2 3 2 3   Restless 2 1 1 3   Easily annoyed or irritable 1 0 1 3  Afraid - awful might happen 2 3 0 3  Total GAD 7 Score 12 13 10 21   Anxiety Difficulty Somewhat difficult Somewhat difficult Very difficult Very difficult   Anhedonia: no Weight changes: no Insomnia: no   Hypersomnia: no Fatigue/loss of energy: no Feelings of worthlessness: no Feelings of guilt: no Impaired concentration/indecisiveness: no Suicidal ideations: no  Crying spells: no Recent Stressors/Life Changes: yes   Relationship problems: no   Family stress: yes     Financial  stress: yes    Job stress: yes    Recent death/loss: yes  INSOMNIA Duration: chronic Satisfied with sleep quality: yes Difficulty falling asleep: no Difficulty staying asleep: no Waking a few hours after sleep onset: no Early morning awakenings: no Daytime hypersomnolence: no Wakes feeling refreshed: yes Good sleep hygiene: yes Apnea: no Snoring: no Depressed/anxious mood: yes Recent stress: yes Restless legs/nocturnal leg cramps: no Chronic pain/arthritis: no History of sleep study: no Treatments attempted: klonapin, melatonin, uinsom, benadryl and ambien   GERD GERD control status: controlled  Satisfied with current treatment? yes Heartburn frequency: rarely Medication side effects: no  Medication compliance: excellent Dysphagia: no Odynophagia:  no Hematemesis: no Blood in stool: no EGD: no  She currently lives with: husband and kids Menopausal Symptoms: no  Depression Screen done today and results listed below:  Depression screen [facility] 2/9 08/11/2017 04/18/2017 07/08/2016 05/16/2015 04/18/2015  Decreased Interest 1 2 0 1 1  Down, Depressed, Hopeless 1 1 1 1  0  PHQ - 2 Score 2 3 1 2 1   Altered sleeping 1 1 - - -  Tired, decreased energy 1 2 - - -  Change in appetite 0 0 - - -  Feeling bad or failure about yourself  1 0 - - -  Trouble concentrating 1 3 - - -  Moving slowly or fidgety/restless 0 0 - - -  Suicidal thoughts 0 0 - - -  PHQ-9 Score 6 9 - - -   Past Medical History:  Past Medical History:  Diagnosis Date  . Abdominal pain   . Acute gastritis without hemorrhage   . 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 Appendicitis 02/01/2017  . C. difficile colitis 10/2016  . Diverticulitis 10/2016  . Diverticulitis of large intestine without perforation or abscess without bleeding   . Flank pain   . Gross hematuria   . History of kidney stones   . 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 Insomnia   . LBP (low back pain) 12/13/2014  . Ovarian cyst   . Renal  calculus, right   . Renal calculus, right   . Thoracic back pain    Right sided    Surgical History:  Past Surgical History:  Procedure Laterality Date  . ABDOMINAL HYSTERECTOMY  08/2013  . ABDOMINAL HYSTERECTOMY    . BREAST BIOPSY Left 08/10/2016   [place] core  . COLONOSCOPY WITH PROPOFOL N/A 04/25/2017   Procedure: COLONOSCOPY WITH PROPOFOL;  Surgeon: Lucilla Lame, MD;  Location: [place];  Service: Endoscopy;  Laterality: N/A;  . CYSTOSCOPY  11/26/2010   with stent placement  . ESOPHAGOGASTRODUODENOSCOPY (EGD) WITH PROPOFOL N/A 04/25/2017   Procedure: ESOPHAGOGASTRODUODENOSCOPY (EGD) WITH PROPOFOL;  Surgeon: Lucilla Lame, MD;  Location: [place];  Service: Endoscopy;  Laterality: N/A;  . HEMORRHOID SURGERY    . kidney stent    . LAPAROSCOPIC APPENDECTOMY N/A 02/02/2017   Procedure: APPENDECTOMY LAPAROSCOPIC;  Surgeon: Clayburn Pert, MD;  Location: [facility];  Service: General;  Laterality: N/A;  . LAPAROSCOPIC SIGMOID COLECTOMY N/A 11/01/2016   Procedure: LAPAROSCOPIC SIGMOID COLECTOMY;  Surgeon: Clayburn Pert, MD;  Location: [facility];  Service: General;  Laterality: N/A;  . LITHOTRIPSY     X 4  . POLYPECTOMY  04/25/2017   Procedure: POLYPECTOMY INTESTINAL;  Surgeon: Lucilla Lame, MD;  Location: [place];  Service: Endoscopy;;  . TONSILLECTOMY    . TUBAL LIGATION    . Ureteroscopy Right 11/21/1997   with holmium laser    Medications:  No current outpatient medications on file prior to visit.   No current facility-administered medications on file prior to visit.     Allergies:  Allergies  Allergen Reactions  . Urocit-K [Potassium Citrate] Nausea Only    Social History:  Social History   Socioeconomic History  . Marital status: Married    Spouse name: Not on file  . Number of children: Not on file  . Years of education: Not on file  . Highest education level: Not on file  Occupational History  . Not on file  Social Needs  .  Financial resource strain: Not on file  . Food insecurity:    Worry: Not on file    Inability: Not on file  . Transportation needs:    Medical: Not on file    Non-medical: Not on file  Tobacco Use  . Smoking status: Current Every Day Smoker    Packs/day: 0.25    Types: Cigarettes  . Smokeless tobacco: Never Used  Substance and Sexual Activity  . Alcohol use: Yes    Alcohol/week: 0.0 oz    Comment: occasional  . Drug use: No  . Sexual activity: Yes    Birth control/protection: Surgical  Lifestyle  . Physical activity:    Days per week:  Not on file    Minutes per session: Not on file  . Stress: Not on file  Relationships  . Social connections:    Talks on phone: Not on file    Gets together: Not on file    Attends religious service: Not on file    Active member of club or organization: Not on file    Attends meetings of clubs or organizations: Not on file    Relationship status: Not on file  . Intimate partner violence:    Fear of current or ex partner: Not on file    Emotionally abused: Not on file    Physically abused: Not on file    Forced sexual activity: Not on file  Other Topics Concern  . Not on file  Social History Narrative  . Not on file   Social History   Tobacco Use  Smoking Status Current Every Day Smoker  . Packs/day: 0.25  . Types: Cigarettes  Smokeless Tobacco Never Used   Social History   Substance and Sexual Activity  Alcohol Use Yes  . Alcohol/week: 0.0 oz   Comment: occasional    Family History:  Family History  Problem Relation Age of Onset  . Kidney Stones Mother   . Ovarian cancer Mother   . Colon cancer Mother   . Lung cancer Father   . Hyperlipidemia Sister   . Hypertension Sister   . Alzheimer's disease Maternal Grandmother   . Cancer Maternal Grandfather        Liver  . Alzheimer's disease Paternal Grandfather   . Hypertension Sister   . Hyperlipidemia Sister   . Breast cancer Neg Hx     Past medical history,  surgical history, medications, allergies, family history and social history reviewed with patient today and changes made to appropriate areas of the chart.   Review of Systems  Constitutional: Negative.   HENT: Negative.   Eyes: Negative.   Respiratory: Negative.   Cardiovascular: Negative.   Gastrointestinal: Positive for heartburn. Negative for abdominal pain, blood in stool, constipation, diarrhea, melena, nausea and vomiting.  Genitourinary: Negative.   Musculoskeletal: Negative.   Skin: Negative.   Neurological: Negative.   Endo/Heme/Allergies: Negative.   Psychiatric/Behavioral: Positive for depression. Negative for hallucinations, memory loss, substance abuse and suicidal ideas. The patient is nervous/anxious. The patient does not have insomnia.     All other ROS negative except what is listed above and in the HPI.      Objective:    BP 102/70 (BP Location: Left Arm, Patient Position: Sitting, Cuff Size: Normal)   Pulse 81   Temp 98.2 F (36.8 C) (Oral)   Ht 5' 2.25" (1.581 m)   Wt 148 lb 6.4 oz (67.3 kg)   SpO2 98%   BMI 26.93 kg/m   Wt Readings from Last 3 Encounters:  08/11/17 148 lb 6.4 oz (67.3 kg)  06/09/17 146 lb 8 oz (66.5 kg)  04/25/17 138 lb (62.6 kg)    Physical Exam  Results for orders placed or performed in visit on 06/09/17  Microscopic Examination  Result Value Ref Range   WBC, UA 0-5 0 - 5 /hpf   RBC, UA 0-2 0 - 2 /hpf   Epithelial Cells (non renal) 0-10 0 - 10 /hpf   Bacteria, UA Few None seen/Few  UA/M w/rflx Culture, Routine  Result Value Ref Range   Specific Gravity, UA 1.025 1.005 - 1.030   pH, UA 6.5 5.0 - 7.5   Color, UA Yellow Yellow  Appearance Ur Cloudy (A) Clear   Leukocytes, UA Negative Negative   Protein, UA Negative Negative/Trace   Glucose, UA Negative Negative   Ketones, UA Negative Negative   RBC, UA 2+ (A) Negative   Bilirubin, UA Negative Negative   Urobilinogen, Ur 0.2 0.2 - 1.0 mg/dL   Nitrite, UA Negative Negative    Microscopic Examination See below:       Assessment & Plan:   Problem List Items Addressed This Visit      Digestive   GERD (gastroesophageal reflux disease)    Under good control on current regimen. Continue current regimen. Continue to monitor. Call with any concerns.       Relevant Medications   esomeprazole (NEXIUM) 20 MG capsule   Other Relevant Orders   CBC with Differential/Platelet   Comprehensive metabolic panel   TSH   UA/M w/rflx Culture, Routine     Other   Female genuine stress incontinence    Continue current regimen. Continue to monitor. Call with any concerns.       Relevant Orders   CBC with Differential/Platelet   Comprehensive metabolic panel   TSH   UA/M w/rflx Culture, Routine   Tobacco abuse    Not interested in quitting right now. Will let us know when she is.      Relevant Orders   CBC with Differential/Platelet   Comprehensive metabolic panel   TSH   UA/M w/rflx Culture, Routine   Insomnia    Continue current regimen. Continue to monitor. Call with any concerns. Refills given today.      Relevant Orders   CBC with Differential/Platelet   Comprehensive metabolic panel   TSH   UA/M w/rflx Culture, Routine   Menopausal symptoms    Well controlled on estroven. Call with any concerns. Continue to monitor.       Relevant Orders   CBC with Differential/Platelet   Comprehensive metabolic panel   TSH   UA/M w/rflx Culture, Routine   Anxiety    Stable. Still dealing with her father's death. Continue to monitor. Call with any concerns.      Relevant Medications   DULoxetine (CYMBALTA) 60 MG capsule   Other Relevant Orders   CBC with Differential/Platelet   Comprehensive metabolic panel   TSH   UA/M w/rflx Culture, Routine    Other Visit Diagnoses    Routine general medical examination at a health care facility    -  Primary   Vaccines up to date. Screening labs checked today. Pap N/A. Mammogram and colonoscopy up to date.  Continue diet and exercise. Call with any concerns.    Relevant Orders   CBC with Differential/Platelet   Comprehensive metabolic panel   Lipid Panel w/o Chol/HDL Ratio   TSH   UA/M w/rflx Culture, Routine   Screening for cholesterol level       Labs drawn today. Await results.    Relevant Orders   Lipid Panel w/o Chol/HDL Ratio       Follow up plan: Return in about 3 months (around 11/11/2017) for follow up anxiety.   LABORATORY TESTING:  - Pap smear: not applicable  IMMUNIZATIONS:   - Tdap: Tetanus vaccination status reviewed: last tetanus booster within 10 years. - Influenza: Up to date - Pneumovax: Up to date - Prevnar: Not applicable - HPV: Not applicable - Zostavax vaccine: Not applicable  SCREENING: -Mammogram: Up to date  - Colonoscopy: Up to date   PATIENT COUNSELING:   Advised to take 1 mg of  folate supplement per day if capable of pregnancy.   Sexuality: Discussed sexually transmitted diseases, partner selection, use of condoms, avoidance of unintended pregnancy  and contraceptive alternatives.   Advised to avoid cigarette smoking.  I discussed with the patient that most people either abstain from alcohol or drink within safe limits (<=14/week and <=4 drinks/occasion for males, <=7/weeks and <= 3 drinks/occasion for females) and that the risk for alcohol disorders and other health effects rises proportionally with the number of drinks per week and how often a drinker exceeds daily limits.  Discussed cessation/primary prevention of drug use and availability of treatment for abuse.   Diet: Encouraged to adjust caloric intake to maintain  or achieve ideal body weight, to reduce intake of dietary saturated fat and total fat, to limit sodium intake by avoiding high sodium foods and not adding table salt, and to maintain adequate dietary potassium and calcium preferably from fresh fruits, vegetables, and low-fat dairy products.    stressed the importance of regular  exercise  Injury prevention: Discussed safety belts, safety helmets, smoke detector, smoking near bedding or upholstery.   Dental health: Discussed importance of regular tooth brushing, flossing, and dental visits.    NEXT PREVENTATIVE PHYSICAL DUE IN 1 YEAR. Return in about 3 months (around 11/11/2017) for follow up anxiety.

## 2017-08-11 NOTE — Patient Instructions (Signed)
Health Maintenance for Postmenopausal Women Menopause is a normal process in which your reproductive ability comes to an end. This process happens gradually over a span of months to years, usually between the ages of 22 and 9. Menopause is complete when you have missed 12 consecutive menstrual periods. It is important to talk with your health care provider about some of the most common conditions that affect postmenopausal women, such as heart disease, cancer, and bone loss (osteoporosis). Adopting a healthy lifestyle and getting preventive care can help to promote your health and wellness. Those actions can also lower your chances of developing some of these common conditions. What should I know about menopause? During menopause, you may experience a number of symptoms, such as:  Moderate-to-severe hot flashes.  Night sweats.  Decrease in sex drive.  Mood swings.  Headaches.  Tiredness.  Irritability.  Memory problems.  Insomnia.  Choosing to treat or not to treat menopausal changes is an individual decision that you make with your health care provider. What should I know about hormone replacement therapy and supplements? Hormone therapy products are effective for treating symptoms that are associated with menopause, such as hot flashes and night sweats. Hormone replacement carries certain risks, especially as you become older. If you are thinking about using estrogen or estrogen with progestin treatments, discuss the benefits and risks with your health care provider. What should I know about heart disease and stroke? Heart disease, heart attack, and stroke become more likely as you age. This may be due, in part, to the hormonal changes that your body experiences during menopause. These can affect how your body processes dietary fats, triglycerides, and cholesterol. Heart attack and stroke are both medical emergencies. There are many things that you can do to help prevent heart disease  and stroke:  Have your blood pressure checked at least every 1-2 years. High blood pressure causes heart disease and increases the risk of stroke.  If you are 53-22 years old, ask your health care provider if you should take aspirin to prevent a heart attack or a stroke.  Do not use any tobacco products, including cigarettes, chewing tobacco, or electronic cigarettes. If you need help quitting, ask your health care provider.  It is important to eat a healthy diet and maintain a healthy weight. ? Be sure to include plenty of vegetables, fruits, low-fat dairy products, and lean protein. ? Avoid eating foods that are high in solid fats, added sugars, or salt (sodium).  Get regular exercise. This is one of the most important things that you can do for your health. ? Try to exercise for at least 150 minutes each week. The type of exercise that you do should increase your heart rate and make you sweat. This is known as moderate-intensity exercise. ? Try to do strengthening exercises at least twice each week. Do these in addition to the moderate-intensity exercise.  Know your numbers.Ask your health care provider to check your cholesterol and your blood glucose. Continue to have your blood tested as directed by your health care provider.  What should I know about cancer screening? There are several types of cancer. Take the following steps to reduce your risk and to catch any cancer development as early as possible. Breast Cancer  Practice breast self-awareness. ? This means understanding how your breasts normally appear and feel. ? It also means doing regular breast self-exams. Let your health care provider know about any changes, no matter how small.  If you are 40  or older, have a clinician do a breast exam (clinical breast exam or CBE) every year. Depending on your age, family history, and medical history, it may be recommended that you also have a yearly breast X-ray (mammogram).  If you  have a family history of breast cancer, talk with your health care provider about genetic screening.  If you are at high risk for breast cancer, talk with your health care provider about having an MRI and a mammogram every year.  Breast cancer (BRCA) gene test is recommended for women who have family members with BRCA-related cancers. Results of the assessment will determine the need for genetic counseling and BRCA1 and for BRCA2 testing. BRCA-related cancers include these types: ? Breast. This occurs in males or females. ? Ovarian. ? Tubal. This may also be called fallopian tube cancer. ? Cancer of the abdominal or pelvic lining (peritoneal cancer). ? Prostate. ? Pancreatic.  Cervical, Uterine, and Ovarian Cancer Your health care provider may recommend that you be screened regularly for cancer of the pelvic organs. These include your ovaries, uterus, and vagina. This screening involves a pelvic exam, which includes checking for microscopic changes to the surface of your cervix (Pap test).  For women ages 21-65, health care providers may recommend a pelvic exam and a Pap test every three years. For women ages 79-65, they may recommend the Pap test and pelvic exam, combined with testing for human papilloma virus (HPV), every five years. Some types of HPV increase your risk of cervical cancer. Testing for HPV may also be done on women of any age who have unclear Pap test results.  Other health care providers may not recommend any screening for nonpregnant women who are considered low risk for pelvic cancer and have no symptoms. Ask your health care provider if a screening pelvic exam is right for you.  If you have had past treatment for cervical cancer or a condition that could lead to cancer, you need Pap tests and screening for cancer for at least 20 years after your treatment. If Pap tests have been discontinued for you, your risk factors (such as having a new sexual partner) need to be  reassessed to determine if you should start having screenings again. Some women have medical problems that increase the chance of getting cervical cancer. In these cases, your health care provider may recommend that you have screening and Pap tests more often.  If you have a family history of uterine cancer or ovarian cancer, talk with your health care provider about genetic screening.  If you have vaginal bleeding after reaching menopause, tell your health care provider.  There are currently no reliable tests available to screen for ovarian cancer.  Lung Cancer Lung cancer screening is recommended for adults 69-62 years old who are at high risk for lung cancer because of a history of smoking. A yearly low-dose CT scan of the lungs is recommended if you:  Currently smoke.  Have a history of at least 30 pack-years of smoking and you currently smoke or have quit within the past 15 years. A pack-year is smoking an average of one pack of cigarettes per day for one year.  Yearly screening should:  Continue until it has been 15 years since you quit.  Stop if you develop a health problem that would prevent you from having lung cancer treatment.  Colorectal Cancer  This type of cancer can be detected and can often be prevented.  Routine colorectal cancer screening usually begins at  age 42 and continues through age 45.  If you have risk factors for colon cancer, your health care provider may recommend that you be screened at an earlier age.  If you have a family history of colorectal cancer, talk with your health care provider about genetic screening.  Your health care provider may also recommend using home test kits to check for hidden blood in your stool.  A small camera at the end of a tube can be used to examine your colon directly (sigmoidoscopy or colonoscopy). This is done to check for the earliest forms of colorectal cancer.  Direct examination of the colon should be repeated every  5-10 years until age 71. However, if early forms of precancerous polyps or small growths are found or if you have a family history or genetic risk for colorectal cancer, you may need to be screened more often.  Skin Cancer  Check your skin from head to toe regularly.  Monitor any moles. Be sure to tell your health care provider: ? About any new moles or changes in moles, especially if there is a change in a mole's shape or color. ? If you have a mole that is larger than the size of a pencil eraser.  If any of your family members has a history of skin cancer, especially at a young age, talk with your health care provider about genetic screening.  Always use sunscreen. Apply sunscreen liberally and repeatedly throughout the day.  Whenever you are outside, protect yourself by wearing long sleeves, pants, a wide-brimmed hat, and sunglasses.  What should I know about osteoporosis? Osteoporosis is a condition in which bone destruction happens more quickly than new bone creation. After menopause, you may be at an increased risk for osteoporosis. To help prevent osteoporosis or the bone fractures that can happen because of osteoporosis, the following is recommended:  If you are 46-71 years old, get at least 1,000 mg of calcium and at least 600 mg of vitamin D per day.  If you are older than age 55 but younger than age 65, get at least 1,200 mg of calcium and at least 600 mg of vitamin D per day.  If you are older than age 54, get at least 1,200 mg of calcium and at least 800 mg of vitamin D per day.  Smoking and excessive alcohol intake increase the risk of osteoporosis. Eat foods that are rich in calcium and vitamin D, and do weight-bearing exercises several times each week as directed by your health care provider. What should I know about how menopause affects my mental health? Depression may occur at any age, but it is more common as you become older. Common symptoms of depression  include:  Low or sad mood.  Changes in sleep patterns.  Changes in appetite or eating patterns.  Feeling an overall lack of motivation or enjoyment of activities that you previously enjoyed.  Frequent crying spells.  Talk with your health care provider if you think that you are experiencing depression. What should I know about immunizations? It is important that you get and maintain your immunizations. These include:  Tetanus, diphtheria, and pertussis (Tdap) booster vaccine.  Influenza every year before the flu season begins.  Pneumonia vaccine.  Shingles vaccine.  Your health care provider may also recommend other immunizations. This information is not intended to replace advice given to you by your health care provider. Make sure you discuss any questions you have with your health care provider. Document Released: 05/14/2005  Document Revised: 10/10/2015 Document Reviewed: 12/24/2014 Elsevier Interactive Patient Education  2018 [employer]  

## 2017-08-11 NOTE — Assessment & Plan Note (Signed)
Continue current regimen. Continue to monitor. Call with any concerns. 

## 2017-08-11 NOTE — Assessment & Plan Note (Signed)
Not interested in quitting right now. Will let us know when she is.

## 2017-08-12 ENCOUNTER — Encounter: Payer: Self-pay | Admitting: Family Medicine

## 2017-08-12 LAB — LIPID PANEL W/O CHOL/HDL RATIO
CHOLESTEROL TOTAL: 212 mg/dL — AB (ref 100–199)
HDL: 55 mg/dL (ref >39)
LDL CALC: 126 mg/dL — AB (ref 0–99)
TRIGLYCERIDES: 157 mg/dL — AB (ref 0–149)
VLDL Cholesterol Cal: 31 mg/dL (ref 5–40)

## 2017-08-12 LAB — CBC WITH DIFFERENTIAL/PLATELET
BASOS ABS: 0 10*3/uL (ref 0.0–0.2)
Basos: 0 %
EOS (ABSOLUTE): 0.2 10*3/uL (ref 0.0–0.4)
Eos: 4 %
HEMOGLOBIN: 13.7 g/dL (ref 11.1–15.9)
Hematocrit: 41 % (ref 34.0–46.6)
IMMATURE GRANULOCYTES: 0 %
Immature Grans (Abs): 0 10*3/uL (ref 0.0–0.1)
LYMPHS ABS: 2.1 10*3/uL (ref 0.7–3.1)
Lymphs: 41 %
MCH: 32 pg (ref 26.6–33.0)
MCHC: 33.4 g/dL (ref 31.5–35.7)
MCV: 96 fL (ref 79–97)
MONOCYTES: 7 %
Monocytes Absolute: 0.3 10*3/uL (ref 0.1–0.9)
NEUTROS PCT: 48 %
Neutrophils Absolute: 2.5 10*3/uL (ref 1.4–7.0)
Platelets: 293 10*3/uL (ref 150–379)
RBC: 4.28 x10E6/uL (ref 3.77–5.28)
RDW: 14.7 % (ref 12.3–15.4)
WBC: 5.1 10*3/uL (ref 3.4–10.8)

## 2017-08-12 LAB — COMPREHENSIVE METABOLIC PANEL
ALBUMIN: 4.2 g/dL (ref 3.5–5.5)
ALK PHOS: 123 IU/L — AB (ref 39–117)
ALT: 24 IU/L (ref 0–32)
AST: 21 IU/L (ref 0–40)
Albumin/Globulin Ratio: 1.8 (ref 1.2–2.2)
BUN/Creatinine Ratio: 30 — ABNORMAL HIGH (ref 9–23)
BUN: 20 mg/dL (ref 6–24)
Bilirubin Total: <0.2 mg/dL (ref 0.0–1.2)
CO2: 24 mmol/L (ref 20–29)
CREATININE: 0.67 mg/dL (ref 0.57–1.00)
Calcium: 9.7 mg/dL (ref 8.7–10.2)
Chloride: 106 mmol/L (ref 96–106)
GFR calc Af Amer: 119 mL/min/{1.73_m2} (ref >59)
GFR calc non Af Amer: 103 mL/min/{1.73_m2} (ref >59)
GLUCOSE: 84 mg/dL (ref 65–99)
Globulin, Total: 2.4 g/dL (ref 1.5–4.5)
Potassium: 4.4 mmol/L (ref 3.5–5.2)
Sodium: 144 mmol/L (ref 134–144)
Total Protein: 6.6 g/dL (ref 6.0–8.5)

## 2017-08-12 LAB — TSH: TSH: 1.12 u[IU]/mL (ref 0.450–4.500)

## 2017-09-06 ENCOUNTER — Encounter: Payer: Self-pay | Admitting: Family Medicine

## 2017-09-09 ENCOUNTER — Other Ambulatory Visit: Payer: Self-pay | Admitting: Family Medicine

## 2017-09-09 MED ORDER — DULOXETINE HCL 20 MG PO CPEP: ORAL_CAPSULE | ORAL | 0 refills | Status: DC

## 2017-11-07 ENCOUNTER — Ambulatory Visit: Payer: BC Managed Care – PPO | Admitting: Family Medicine

## 2017-11-11 ENCOUNTER — Ambulatory Visit (INDEPENDENT_AMBULATORY_CARE_PROVIDER_SITE_OTHER): Payer: BC Managed Care – PPO | Admitting: Family Medicine

## 2017-11-11 ENCOUNTER — Other Ambulatory Visit: Payer: Self-pay

## 2017-11-11 ENCOUNTER — Encounter: Payer: Self-pay | Admitting: Family Medicine

## 2017-11-11 VITALS — BP 119/81 | HR 106 | Temp 98.3°F | Ht 62.2 in | Wt 149.6 lb

## 2017-11-11 DIAGNOSIS — F419 Anxiety disorder, unspecified: Secondary | ICD-10-CM | POA: Diagnosis not present

## 2017-11-11 DIAGNOSIS — F4321 Adjustment disorder with depressed mood: Secondary | ICD-10-CM | POA: Diagnosis not present

## 2017-11-11 MED ORDER — SUCRALFATE 1 GM/10ML PO SUSP: 1.0000 g | Freq: Three times a day (TID) | ORAL | 0 refills | Status: DC

## 2017-11-11 MED ORDER — CLONAZEPAM 1 MG PO TABS: 0.5000 mg | ORAL_TABLET | Freq: Three times a day (TID) | ORAL | 1 refills | Status: DC | PRN

## 2017-11-11 MED ORDER — DULOXETINE HCL 20 MG PO CPEP: 60.0000 mg | ORAL_CAPSULE | Freq: Every day | ORAL | 0 refills | Status: DC

## 2017-11-11 MED ORDER — ESOMEPRAZOLE MAGNESIUM 20 MG PO CPDR: 40.0000 mg | DELAYED_RELEASE_CAPSULE | Freq: Every day | ORAL | 1 refills | Status: DC

## 2017-11-11 NOTE — Progress Notes (Signed)
BP 119/81   Pulse (!) 106   Temp 98.3 F (36.8 C) (Oral)   Ht 5' 2.2" (1.58 m)   Wt 149 lb 9.6 oz (67.9 kg)   SpO2 96%   BMI 27.19 kg/m    Subjective:    Patient ID: Morgan Cisneros, female    DOB: [birth year], 52 y.o.   MRN: [id number]  HPI: Morgan Cisneros is a 52 y.o. female  Chief Complaint  Patient presents with  . Anxiety  . Medication Refill    klonopin, cymbalta   ANXIETY/STRESS- nephew overdosed on heroin less than a week ago. She is in shock and feeling terrible. Not sleeping. Stress is bad.  Duration:exacerbated Anxious mood: yes  Excessive worrying: yes Irritability: yes  Sweating: no Nausea: no Palpitations:yes Hyperventilation: no Panic attacks: yes Agoraphobia: no  Obscessions/compulsions: no Depressed mood: yes Depression screen [facility] 2/9 11/11/2017 08/11/2017 04/18/2017 07/08/2016 05/16/2015  Decreased Interest 3 1 2  0 1  Down, Depressed, Hopeless 2 1 1 1 1   PHQ - 2 Score 5 2 3 1 2   Altered sleeping 2 1 1  - -  Tired, decreased energy 2 1 2  - -  Change in appetite 2 0 0 - -  Feeling bad or failure about yourself  0 1 0 - -  Trouble concentrating 3 1 3  - -  Moving slowly or fidgety/restless 0 0 0 - -  Suicidal thoughts 0 0 0 - -  PHQ-9 Score 14 6 9  - -  Difficult doing work/chores Somewhat difficult - - - -   Anhedonia: no Weight changes: no Insomnia: yes hard to fall asleep  Hypersomnia: no Fatigue/loss of energy: yes Feelings of worthlessness: yes Feelings of guilt: yes Impaired concentration/indecisiveness: yes Suicidal ideations: no  Crying spells: no Recent Stressors/Life Changes: yes   Relationship problems: no   Family stress: yes     Financial stress: yes    Job stress: yes    Recent death/loss: yes  Stomach has been terrible because of her stress.   Relevant past medical, surgical, family and social history reviewed and updated as indicated. Interim medical history since our last visit reviewed. Allergies and medications  reviewed and updated.  Review of Systems  Constitutional: Negative.   Respiratory: Negative.   Cardiovascular: Negative.   Skin: Negative.   Neurological: Negative.   Psychiatric/Behavioral: Positive for dysphoric mood. Negative for agitation, behavioral problems, confusion, decreased concentration, hallucinations, self-injury, sleep disturbance and suicidal ideas. The patient is nervous/anxious. The patient is not hyperactive.     Per HPI unless specifically indicated above     Objective:    BP 119/81   Pulse (!) 106   Temp 98.3 F (36.8 C) (Oral)   Ht 5' 2.2" (1.58 m)   Wt 149 lb 9.6 oz (67.9 kg)   SpO2 96%   BMI 27.19 kg/m   Wt Readings from Last 3 Encounters:  11/11/17 149 lb 9.6 oz (67.9 kg)  08/11/17 148 lb 6.4 oz (67.3 kg)  06/09/17 146 lb 8 oz (66.5 kg)    Physical Exam  Constitutional: She is oriented to person, place, and time. She appears well-developed and well-nourished. No distress.  HENT:  Head: Normocephalic and atraumatic.  Right Ear: Hearing normal.  Left Ear: Hearing normal.  Nose: Nose normal.  Eyes: Conjunctivae and lids are normal. Right eye exhibits no discharge. Left eye exhibits no discharge. No scleral icterus.  Cardiovascular: Normal rate, regular rhythm, normal heart sounds and intact distal pulses. Exam reveals no  gallop and no friction rub.  No murmur heard. Pulmonary/Chest: Effort normal and breath sounds normal. No stridor. No respiratory distress. She has no wheezes. She has no rales. She exhibits no tenderness.  Musculoskeletal: Normal range of motion.  Neurological: She is alert and oriented to person, place, and time.  Skin: Skin is warm, dry and intact. Capillary refill takes less than 2 seconds. No rash noted. She is not diaphoretic. No erythema. No pallor.  Psychiatric: She has a normal mood and affect. Her speech is normal and behavior is normal. Judgment and thought content normal. Cognition and memory are normal.  Nursing note and  vitals reviewed.   Results for orders placed or performed in visit on 08/11/17  Microscopic Examination  Result Value Ref Range   WBC, UA 0-5 0 - 5 /hpf   RBC, UA 0-2 0 - 2 /hpf   Epithelial Cells (non renal) 0-10 0 - 10 /hpf   Bacteria, UA Few None seen/Few  CBC with Differential/Platelet  Result Value Ref Range   WBC 5.1 3.4 - 10.8 x10E3/uL   RBC 4.28 3.77 - 5.28 x10E6/uL   Hemoglobin 13.7 11.1 - 15.9 g/dL   Hematocrit 41.0 34.0 - 46.6 %   MCV 96 79 - 97 fL   MCH 32.0 26.6 - 33.0 pg   MCHC 33.4 31.5 - 35.7 g/dL   RDW 14.7 12.3 - 15.4 %   Platelets 293 150 - 379 x10E3/uL   Neutrophils 48 Not Estab. %   Lymphs 41 Not Estab. %   Monocytes 7 Not Estab. %   Eos 4 Not Estab. %   Basos 0 Not Estab. %   Neutrophils Absolute 2.5 1.4 - 7.0 x10E3/uL   Lymphocytes Absolute 2.1 0.7 - 3.1 x10E3/uL   Monocytes Absolute 0.3 0.1 - 0.9 x10E3/uL   EOS (ABSOLUTE) 0.2 0.0 - 0.4 x10E3/uL   Basophils Absolute 0.0 0.0 - 0.2 x10E3/uL   Immature Granulocytes 0 Not Estab. %   Immature Grans (Abs) 0.0 0.0 - 0.1 x10E3/uL  Comprehensive metabolic panel  Result Value Ref Range   Glucose 84 65 - 99 mg/dL   BUN 20 6 - 24 mg/dL   Creatinine, Ser 0.67 0.57 - 1.00 mg/dL   GFR calc non Af Amer 103 >59 mL/min/1.73   GFR calc Af Amer 119 >59 mL/min/1.73   BUN/Creatinine Ratio 30 (H) 9 - 23   Sodium 144 134 - 144 mmol/L   Potassium 4.4 3.5 - 5.2 mmol/L   Chloride 106 96 - 106 mmol/L   CO2 24 20 - 29 mmol/L   Calcium 9.7 8.7 - 10.2 mg/dL   Total Protein 6.6 6.0 - 8.5 g/dL   Albumin 4.2 3.5 - 5.5 g/dL   Globulin, Total 2.4 1.5 - 4.5 g/dL   Albumin/Globulin Ratio 1.8 1.2 - 2.2   Bilirubin Total <0.2 0.0 - 1.2 mg/dL   Alkaline Phosphatase 123 (H) 39 - 117 IU/L   AST 21 0 - 40 IU/L   ALT 24 0 - 32 IU/L  Lipid Panel w/o Chol/HDL Ratio  Result Value Ref Range   Cholesterol, Total 212 (H) 100 - 199 mg/dL   Triglycerides 157 (H) 0 - 149 mg/dL   HDL 55 >39 mg/dL   VLDL Cholesterol Cal 31 5 - 40 mg/dL    LDL Calculated 126 (H) 0 - 99 mg/dL  TSH  Result Value Ref Range   TSH 1.120 0.450 - 4.500 uIU/mL  UA/M w/rflx Culture, Routine  Result Value Ref Range  Specific Gravity, UA 1.015 1.005 - 1.030   pH, UA 7.0 5.0 - 7.5   Color, UA Yellow Yellow   Appearance Ur Turbid (A) Clear   Leukocytes, UA Negative Negative   Protein, UA Negative Negative/Trace   Glucose, UA Negative Negative   Ketones, UA Negative Negative   RBC, UA 1+ (A) Negative   Bilirubin, UA Negative Negative   Urobilinogen, Ur 0.2 0.2 - 1.0 mg/dL   Nitrite, UA Negative Negative   Microscopic Examination See below:       Assessment & Plan:   Problem List Items Addressed This Visit      Other   Anxiety - Primary    In acute exacerbation due to the death of her nephew. Will continue her duloxetine at 60mg  and increase her klonopin to TID PRN. Call with any concerns. Recheck 2 months. Information about grief counseling provided to patient.       Relevant Medications   DULoxetine (CYMBALTA) 20 MG capsule    Other Visit Diagnoses    Grief       See discussion under anxiety.       Follow up plan: Return in about 2 months (around 01/11/2018) for follow up mood.

## 2017-11-11 NOTE — Assessment & Plan Note (Signed)
In acute exacerbation due to the death of her nephew. Will continue her duloxetine at 60mg  and increase her klonopin to TID PRN. Call with any concerns. Recheck 2 months. Information about grief counseling provided to patient.

## 2017-12-20 ENCOUNTER — Encounter: Payer: Self-pay | Admitting: Emergency Medicine

## 2017-12-20 ENCOUNTER — Other Ambulatory Visit: Payer: Self-pay

## 2017-12-20 ENCOUNTER — Ambulatory Visit
Admission: EM | Admit: 2017-12-20 | Discharge: 2017-12-20 | Disposition: A | Discharge status: Home / Self Care | Payer: BC Managed Care – PPO | Attending: Family Medicine | Admitting: Family Medicine

## 2017-12-20 DIAGNOSIS — J069 Acute upper respiratory infection, unspecified: Secondary | ICD-10-CM

## 2017-12-20 MED ORDER — HYDROCOD POLST-CPM POLST ER 10-8 MG/5ML PO SUER: 5.0000 mL | Freq: Two times a day (BID) | ORAL | 0 refills | Status: DC

## 2017-12-20 MED ORDER — AZITHROMYCIN 250 MG PO TABS: 250.0000 mg | ORAL_TABLET | Freq: Every day | ORAL | 0 refills | Status: DC

## 2017-12-20 MED ORDER — BENZONATATE 200 MG PO CAPS: ORAL_CAPSULE | ORAL | 0 refills | Status: DC

## 2017-12-20 NOTE — ED Triage Notes (Signed)
Patient c/o cough and chest congestion for a week.  Patient denies fevers.  °

## 2017-12-20 NOTE — ED Provider Notes (Signed)
[facility]    CSN: [id number] Arrival date & time: 12/20/17  0946     History   Chief Complaint Chief Complaint  Patient presents with  . Cough    HPI Morgan Cisneros is a 52 y.o. female.   HPI  22-year-old female presents with 1 week history of cough and chest congestion.  Not complain of nasal stuffiness or discharge.  Has had no fever or chills.  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  She is every day smoker.  States that initially the cough was productive but now has not been is productive and has more consolidation and discomfort in her chest with coughing.        Past Medical History:  Diagnosis Date  . Abdominal pain   . Acute gastritis without hemorrhage   . 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 Appendicitis 02/01/2017  . C. difficile colitis 10/2016  . Diverticulitis 10/2016  . Diverticulitis of large intestine without perforation or abscess without bleeding   . Flank pain   . Gross hematuria   . History of kidney stones   . Inflammation of sacroiliac joint ([place]) 10/28/2016  . Insomnia   . LBP (low back pain) 12/13/2014  . Ovarian cyst   . Renal calculus, right   . Renal calculus, right   . Thoracic back pain    Right sided    Patient Active Problem List   Diagnosis Date Noted  . Benign neoplasm of ascending colon   . Anxiety 04/18/2017  . Spasm of back muscles 10/28/2016  . Diverticulitis 10/19/2016  . GERD (gastroesophageal reflux disease) 04/18/2015  . Menopausal symptoms 04/18/2015  . Insomnia   . Family history of colon cancer in mother 01/15/2015  . Family history of ovarian cancer 01/15/2015  . Tobacco abuse 01/15/2015  . Lymphadenopathy, axillary 01/15/2015  . Calculus of kidney 12/13/2014  . Female genuine stress incontinence 12/13/2014    Past Surgical History:  Procedure Laterality Date  . ABDOMINAL HYSTERECTOMY  08/2013  . ABDOMINAL HYSTERECTOMY     . BREAST BIOPSY Left 08/10/2016   [place] core  . COLONOSCOPY WITH PROPOFOL N/A 04/25/2017   Procedure: COLONOSCOPY WITH PROPOFOL;  Surgeon: Lucilla Lame, MD;  Location: [facility];  Service: Endoscopy;  Laterality: N/A;  . CYSTOSCOPY  11/26/2010   with stent placement  . ESOPHAGOGASTRODUODENOSCOPY (EGD) WITH PROPOFOL N/A 04/25/2017   Procedure: ESOPHAGOGASTRODUODENOSCOPY (EGD) WITH PROPOFOL;  Surgeon: Lucilla Lame, MD;  Location: [place];  Service: Endoscopy;  Laterality: N/A;  . HEMORRHOID SURGERY    . kidney stent    . LAPAROSCOPIC APPENDECTOMY N/A 02/02/2017   Procedure: APPENDECTOMY LAPAROSCOPIC;  Surgeon: Clayburn Pert, MD;  Location: [facility];  Service: General;  Laterality: N/A;  . LAPAROSCOPIC SIGMOID COLECTOMY N/A 11/01/2016   Procedure: LAPAROSCOPIC SIGMOID COLECTOMY;  Surgeon: Clayburn Pert, MD;  Location: [facility];  Service: General;  Laterality: N/A;  . LITHOTRIPSY     X 4  . POLYPECTOMY  04/25/2017   Procedure: POLYPECTOMY INTESTINAL;  Surgeon: Lucilla Lame, MD;  Location: [place];  Service: Endoscopy;;  . TONSILLECTOMY    . TUBAL LIGATION    . Ureteroscopy Right 11/21/1997   with holmium laser    OB History   None      Home Medications    Prior to Admission medications   Medication Sig Start Date End Date Taking? Authorizing Provider  clonazePAM (KLONOPIN) 1 MG tablet Take 0.5-1 tablets (0.5-1 mg total) by mouth 3 (three) times daily as needed for anxiety. 11/11/17  Yes Johnson, Megan P, DO  DULoxetine (CYMBALTA) 20 MG capsule Take 3 capsules (60 mg total) by mouth daily. 11/11/17  Yes Johnson, Megan P, DO  esomeprazole (NEXIUM) 20 MG capsule Take 2 capsules (40 mg total) by mouth daily at 12 noon. 11/11/17  Yes Johnson, Megan P, DO  azithromycin (ZITHROMAX) 250 MG tablet Take 1 tablet (250 mg total) by mouth daily. Take first 2 tablets together, then 1 every day until finished. 12/20/17   Lorin Picket, PA-C  benzonatate (TESSALON)  200 MG capsule Take one cap TID PRN cough 12/20/17   Lorin Picket, PA-C  chlorpheniramine-HYDROcodone [facility]) 10-8 MG/5ML SUER Take 5 mLs by mouth 2 (two) times daily. 12/20/17   Lorin Picket, PA-C    Family History Family History  Problem Relation Age of Onset  . Kidney Stones Mother   . Ovarian cancer Mother   . Colon cancer Mother   . Lung cancer Father   . Hyperlipidemia Sister   . Hypertension Sister   . Alzheimer's disease Maternal Grandmother   . Cancer Maternal Grandfather        Liver  . Alzheimer's disease Paternal Grandfather   . Hypertension Sister   . Hyperlipidemia Sister   . Breast cancer Neg Hx     Social History Social History   Tobacco Use  . Smoking status: Current Every Day Smoker    Packs/day: 0.25    Types: Cigarettes  . Smokeless tobacco: Never Used  Substance Use Topics  . Alcohol use: Yes    Alcohol/week: 0.0 standard drinks    Comment: occasional  . Drug use: No     Allergies   Urocit-k [potassium citrate]   Review of Systems Review of Systems  Constitutional: Positive for activity change, appetite change, chills, fatigue and fever.  HENT: Positive for congestion.   Respiratory: Positive for cough, shortness of breath and wheezing.   All other systems reviewed and are negative.    Physical Exam Triage Vital Signs ED Triage Vitals  Enc Vitals Group     BP 12/20/17 0955 (!) 139/95     Pulse Rate 12/20/17 0955 87     Resp 12/20/17 0955 14     Temp 12/20/17 0955 98.1 F (36.7 C)     Temp Source 12/20/17 0955 Oral     SpO2 12/20/17 0955 100 %     Weight 12/20/17 0953 150 lb (68 kg)     Height 12/20/17 0953 5\' 5"  (1.651 m)     Head Circumference --      Peak Flow --      Pain Score 12/20/17 0953 0     Pain Loc --      Pain Edu? --      Excl. in [place]? --    No data found.  Updated Vital Signs BP (!) 139/95 (BP Location: Left Arm)   Pulse 87   Temp 98.1 F (36.7 C) (Oral)   Resp 14   Ht 5\' 5"   (1.651 m)   Wt 150 lb (68 kg)   SpO2 100%   BMI 24.96 kg/m   Visual Acuity Right Eye Distance:   Left Eye Distance:   Bilateral Distance:    Right Eye Near:   Left Eye Near:    Bilateral Near:     Physical Exam  Constitutional: She is oriented  to person, place, and time. She appears well-developed and well-nourished. No distress.  HENT:  Head: Normocephalic.  Right Ear: External ear normal.  Left Ear: External ear normal.  Nose: Nose normal.  Mouth/Throat: Oropharynx is clear and moist. No oropharyngeal exudate.  Eyes: Pupils are equal, round, and reactive to light. Right eye exhibits no discharge. Left eye exhibits no discharge.  Neck: Normal range of motion.  Pulmonary/Chest: Effort normal and breath sounds normal.  Musculoskeletal: Normal range of motion.  Neurological: She is alert and oriented to person, place, and time.  Skin: Skin is warm and dry. She is not diaphoretic.  Psychiatric: She has a normal mood and affect. Her behavior is normal. Judgment and thought content normal.  Nursing note and vitals reviewed.    UC Treatments / Results  Labs (all labs ordered are listed, but only abnormal results are displayed) Labs Reviewed - No data to display  EKG None  Radiology No results found.  Procedures Procedures (including critical care time)  Medications Ordered in UC Medications - No data to display  Initial Impression / Assessment and Plan / UC Course  I have reviewed the triage vital signs and the nursing notes.  Pertinent labs & imaging results that were available during my care of the patient were reviewed by me and considered in my medical decision making (see chart for details). The length of time that she has had her symptoms for over a week plus the fact that she continues to smoke I have decided to place her on Z-Pak.  I will addition treat her with cough suppressants.  If she is not improving I have recommend she follow-up with her primary care  physician.     Final Clinical Impressions(s) / UC Diagnoses   Final diagnoses:  Upper respiratory tract infection, unspecified type     Discharge Instructions     Drink plenty of fluids and rest as much as possible.  Hope you are feeling well soon   ED Prescriptions    Medication Sig Dispense Auth. Provider   azithromycin (ZITHROMAX) 250 MG tablet Take 1 tablet (250 mg total) by mouth daily. Take first 2 tablets together, then 1 every day until finished. 6 tablet Lorin Picket, PA-C   benzonatate (TESSALON) 200 MG capsule Take one cap TID PRN cough 30 capsule Lorin Picket, PA-C   chlorpheniramine-HYDROcodone (TUSSIONEX PENNKINETIC ER) 10-8 MG/5ML SUER Take 5 mLs by mouth 2 (two) times daily. 115 mL Lorin Picket, PA-C     Controlled Substance Prescriptions [place] Controlled Substance Registry consulted? Not Applicable   Lorin Picket, PA-C 12/20/17 1419

## 2017-12-20 NOTE — Discharge Instructions (Signed)
Drink plenty of fluids and rest as much as possible.  Hope you are feeling well soon

## 2017-12-23 ENCOUNTER — Ambulatory Visit: Payer: BC Managed Care – PPO | Admitting: Unknown Physician Specialty

## 2018-01-02 ENCOUNTER — Encounter: Payer: Self-pay | Admitting: Family Medicine

## 2018-01-02 MED ORDER — AMOXICILLIN-POT CLAVULANATE 875-125 MG PO TABS: 1.0000 | ORAL_TABLET | Freq: Two times a day (BID) | ORAL | 0 refills | Status: DC

## 2018-01-13 ENCOUNTER — Ambulatory Visit (INDEPENDENT_AMBULATORY_CARE_PROVIDER_SITE_OTHER): Payer: BC Managed Care – PPO | Admitting: Family Medicine

## 2018-01-13 ENCOUNTER — Other Ambulatory Visit: Payer: Self-pay

## 2018-01-13 ENCOUNTER — Encounter: Payer: Self-pay | Admitting: Family Medicine

## 2018-01-13 VITALS — BP 130/89 | HR 98 | Temp 98.8°F | Ht 62.2 in | Wt 152.3 lb

## 2018-01-13 DIAGNOSIS — F419 Anxiety disorder, unspecified: Secondary | ICD-10-CM

## 2018-01-13 DIAGNOSIS — Z23 Encounter for immunization: Secondary | ICD-10-CM

## 2018-01-13 MED ORDER — CLONAZEPAM 1 MG PO TABS: 0.5000 mg | ORAL_TABLET | Freq: Three times a day (TID) | ORAL | 1 refills | Status: DC | PRN

## 2018-01-13 MED ORDER — DULOXETINE HCL 60 MG PO CPEP: 60.0000 mg | ORAL_CAPSULE | Freq: Every day | ORAL | 1 refills | Status: DC

## 2018-01-13 NOTE — Assessment & Plan Note (Signed)
Stable. Will continue current regimen and recheck 3 months. Call with any concerns.

## 2018-01-13 NOTE — Progress Notes (Signed)
BP 130/89   Pulse 98   Temp 98.8 F (37.1 C) (Oral)   Ht 5' 2.2" (1.58 m)   Wt 152 lb 4.8 oz (69.1 kg)   SpO2 99%   BMI 27.68 kg/m    Subjective:    Patient ID: Morgan Cisneros, female    DOB: 18-Oct-1965, 52 y.o.   MRN: [id number]  HPI: Morgan Cisneros is a 52 y.o. female  Chief Complaint  Patient presents with  . Anxiety    cymbalta and klonopin refill requested   ANXIETY/STRESS- grandmother is now in hospice. She has been taking 1.5 klonopin daily. Staying stable.  Duration:exacerbated Anxious mood: yes  Excessive worrying: yes Irritability: yes  Sweating: no Nausea: no Palpitations:no Hyperventilation: no Panic attacks: no Agoraphobia: no  Obscessions/compulsions: no Depressed mood: yes Depression screen [facility] 2/9 01/13/2018 11/11/2017 08/11/2017 04/18/2017 07/08/2016  Decreased Interest 1 3 1 2  0  Down, Depressed, Hopeless 1 2 1 1 1   PHQ - 2 Score 2 5 2 3 1   Altered sleeping 2 2 1 1  -  Tired, decreased energy 3 2 1 2  -  Change in appetite 0 2 0 0 -  Feeling bad or failure about yourself  0 0 1 0 -  Trouble concentrating 3 3 1 3  -  Moving slowly or fidgety/restless 0 0 0 0 -  Suicidal thoughts 0 0 0 0 -  PHQ-9 Score 10 14 6 9  -  Difficult doing work/chores Somewhat difficult Somewhat difficult - - -   GAD 7 : Generalized Anxiety Score 01/13/2018 11/11/2017 08/11/2017 04/18/2017  Nervous, Anxious, on Edge 2 2 1 2   Control/stop worrying 3 2 2 2   Worry too much - different things 3 2 2 2   Trouble relaxing 3 2 2 3   Restless 3 2 2 1   Easily annoyed or irritable 2 0 1 0  Afraid - awful might happen 2 0 2 3  Total GAD 7 Score 18 10 12 13   Anxiety Difficulty Somewhat difficult Somewhat difficult Somewhat difficult Somewhat difficult    Anhedonia: no Weight changes: no Insomnia: yes hard to fall asleep  Hypersomnia: no Fatigue/loss of energy: yes Feelings of worthlessness: no Feelings of guilt: no Impaired concentration/indecisiveness: yes Suicidal ideations:  no  Crying spells: yes Recent Stressors/Life Changes: yes   Relationship problems: no   Family stress: yes     Financial stress: no    Job stress: no    Recent death/loss: yes  Relevant past medical, surgical, family and social history reviewed and updated as indicated. Interim medical history since our last visit reviewed. Allergies and medications reviewed and updated.  Review of Systems  Constitutional: Negative.   Respiratory: Negative.   Cardiovascular: Negative.   Psychiatric/Behavioral: Positive for decreased concentration, dysphoric mood and sleep disturbance. Negative for agitation, behavioral problems, confusion, hallucinations, self-injury and suicidal ideas. The patient is nervous/anxious. The patient is not hyperactive.     Per HPI unless specifically indicated above     Objective:    BP 130/89   Pulse 98   Temp 98.8 F (37.1 C) (Oral)   Ht 5' 2.2" (1.58 m)   Wt 152 lb 4.8 oz (69.1 kg)   SpO2 99%   BMI 27.68 kg/m   Wt Readings from Last 3 Encounters:  01/13/18 152 lb 4.8 oz (69.1 kg)  12/20/17 150 lb (68 kg)  11/11/17 149 lb 9.6 oz (67.9 kg)    Physical Exam  Constitutional: She is oriented to person, place, and time.  She appears well-developed and well-nourished. No distress.  HENT:  Head: Normocephalic and atraumatic.  Right Ear: Hearing normal.  Left Ear: Hearing normal.  Nose: Nose normal.  Eyes: Conjunctivae and lids are normal. Right eye exhibits no discharge. Left eye exhibits no discharge. No scleral icterus.  Cardiovascular: Normal rate, regular rhythm, normal heart sounds and intact distal pulses. Exam reveals no gallop and no friction rub.  No murmur heard. Pulmonary/Chest: Effort normal and breath sounds normal. No stridor. No respiratory distress. She has no wheezes. She has no rales. She exhibits no tenderness.  Musculoskeletal: Normal range of motion.  Neurological: She is alert and oriented to person, place, and time.  Skin: Skin is  warm, dry and intact. Capillary refill takes less than 2 seconds. No rash noted. She is not diaphoretic. No erythema. No pallor.  Psychiatric: She has a normal mood and affect. Her speech is normal and behavior is normal. Judgment and thought content normal. Cognition and memory are normal.    Results for orders placed or performed in visit on 08/11/17  Microscopic Examination  Result Value Ref Range   WBC, UA 0-5 0 - 5 /hpf   RBC, UA 0-2 0 - 2 /hpf   Epithelial Cells (non renal) 0-10 0 - 10 /hpf   Bacteria, UA Few None seen/Few  CBC with Differential/Platelet  Result Value Ref Range   WBC 5.1 3.4 - 10.8 x10E3/uL   RBC 4.28 3.77 - 5.28 x10E6/uL   Hemoglobin 13.7 11.1 - 15.9 g/dL   Hematocrit 41.0 34.0 - 46.6 %   MCV 96 79 - 97 fL   MCH 32.0 26.6 - 33.0 pg   MCHC 33.4 31.5 - 35.7 g/dL   RDW 14.7 12.3 - 15.4 %   Platelets 293 150 - 379 x10E3/uL   Neutrophils 48 Not Estab. %   Lymphs 41 Not Estab. %   Monocytes 7 Not Estab. %   Eos 4 Not Estab. %   Basos 0 Not Estab. %   Neutrophils Absolute 2.5 1.4 - 7.0 x10E3/uL   Lymphocytes Absolute 2.1 0.7 - 3.1 x10E3/uL   Monocytes Absolute 0.3 0.1 - 0.9 x10E3/uL   EOS (ABSOLUTE) 0.2 0.0 - 0.4 x10E3/uL   Basophils Absolute 0.0 0.0 - 0.2 x10E3/uL   Immature Granulocytes 0 Not Estab. %   Immature Grans (Abs) 0.0 0.0 - 0.1 x10E3/uL  Comprehensive metabolic panel  Result Value Ref Range   Glucose 84 65 - 99 mg/dL   BUN 20 6 - 24 mg/dL   Creatinine, Ser 0.67 0.57 - 1.00 mg/dL   GFR calc non Af Amer 103 >59 mL/min/1.73   GFR calc Af Amer 119 >59 mL/min/1.73   BUN/Creatinine Ratio 30 (H) 9 - 23   Sodium 144 134 - 144 mmol/L   Potassium 4.4 3.5 - 5.2 mmol/L   Chloride 106 96 - 106 mmol/L   CO2 24 20 - 29 mmol/L   Calcium 9.7 8.7 - 10.2 mg/dL   Total Protein 6.6 6.0 - 8.5 g/dL   Albumin 4.2 3.5 - 5.5 g/dL   Globulin, Total 2.4 1.5 - 4.5 g/dL   Albumin/Globulin Ratio 1.8 1.2 - 2.2   Bilirubin Total <0.2 0.0 - 1.2 mg/dL   Alkaline  Phosphatase 123 (H) 39 - 117 IU/L   AST 21 0 - 40 IU/L   ALT 24 0 - 32 IU/L  Lipid Panel w/o Chol/HDL Ratio  Result Value Ref Range   Cholesterol, Total 212 (H) 100 - 199 mg/dL  Triglycerides 157 (H) 0 - 149 mg/dL   HDL 55 >39 mg/dL   VLDL Cholesterol Cal 31 5 - 40 mg/dL   LDL Calculated 126 (H) 0 - 99 mg/dL  TSH  Result Value Ref Range   TSH 1.120 0.450 - 4.500 uIU/mL  UA/M w/rflx Culture, Routine  Result Value Ref Range   Specific Gravity, UA 1.015 1.005 - 1.030   pH, UA 7.0 5.0 - 7.5   Color, UA Yellow Yellow   Appearance Ur Turbid (A) Clear   Leukocytes, UA Negative Negative   Protein, UA Negative Negative/Trace   Glucose, UA Negative Negative   Ketones, UA Negative Negative   RBC, UA 1+ (A) Negative   Bilirubin, UA Negative Negative   Urobilinogen, Ur 0.2 0.2 - 1.0 mg/dL   Nitrite, UA Negative Negative   Microscopic Examination See below:       Assessment & Plan:   Problem List Items Addressed This Visit      Other   Anxiety - Primary    Stable. Will continue current regimen and recheck 3 months. Call with any concerns.       Relevant Medications   DULoxetine (CYMBALTA) 60 MG capsule    Other Visit Diagnoses    Flu vaccine need       Flu shot given.    Relevant Orders   Flu Vaccine QUAD 36+ mos IM (Completed)       Follow up plan: Return in about 3 months (around 04/15/2018) for Follow up.

## 2018-01-16 ENCOUNTER — Other Ambulatory Visit: Payer: Self-pay | Admitting: Family Medicine

## 2018-01-17 NOTE — Telephone Encounter (Signed)
Requested Prescriptions  Pending Prescriptions Disp Refills  . DULoxetine HCl 40 MG CPEP [Pharmacy Med Name: DULOXETINE DR 40MG  CAPSULES] 90 capsule 0    Sig: TAKE 1 CAPSULE BY MOUTH DAILY     Psychiatry: Antidepressants - SNRI Passed - 01/16/2018  8:23 AM      Passed - Last BP in normal range    BP Readings from Last 1 Encounters:  01/13/18 130/89         Passed - Valid encounter within last 6 months    Recent Outpatient Visits          4 days ago [place], [place], DO   2 months ago [place], Megan P, DO   5 months ago Routine general medical examination at a health care facility   [facility], [place] P, DO   7 months ago Insomnia, unspecified type   [facility], Megan P, DO   9 months ago [place], [place], DO      Future Appointments            In 3 months Johnson, Megan P, DO [place], [facility]         . esomeprazole ([place]) 20 MG capsule [Pharmacy Med Name: ESOMEPRAZOLE MAGNESIUM 20MG  DR CAPS] 90 capsule 0    Sig: TAKE 1 CAPSULE(20 MG) BY MOUTH DAILY     Gastroenterology: Proton Pump Inhibitors Passed - 01/16/2018  8:23 AM      Passed - Valid encounter within last 12 months    Recent Outpatient Visits          4 days ago [place], [place], DO   2 months ago [place], [place], DO   5 months ago Routine general medical examination at a health care facility   [facility], [place] P, DO   7 months ago Insomnia, unspecified type   [place], [place], DO   9 months ago [place], [place], DO      Future Appointments            In 3 months Johnson, Barb Merino, DO [employer], [place]         . DULoxetine (CYMBALTA) 20 MG capsule [Pharmacy Med Name: DULOXETINE  DR 20MG  CAPSULES] 42 capsule 0    Sig: TAKE 2 CAPSULES DAILY FOR 2 WEEKS, THEN 1 CAPSULE DAILY FOR 2 WEEKS     Psychiatry: Antidepressants - SNRI Passed - 01/16/2018  8:23 AM      Passed - Last BP in normal range    BP Readings from Last 1 Encounters:  01/13/18 130/89         Passed - Valid encounter within last 6 months    Recent Outpatient Visits          4 days ago [place], [place], DO   2 months ago [place], [place], DO   5 months ago Routine general medical examination at a health care facility   [facility], [place] P, DO   7 months ago Insomnia, unspecified type   [facility], Megan P, DO   9 months ago [place], [place], DO  Future Appointments            In 3 months Johnson, Barb Merino, DO [facility], [facility]

## 2018-04-11 ENCOUNTER — Other Ambulatory Visit: Payer: Self-pay | Admitting: Family Medicine

## 2018-04-19 ENCOUNTER — Other Ambulatory Visit: Payer: Self-pay | Admitting: Family Medicine

## 2018-04-19 ENCOUNTER — Encounter: Payer: Self-pay | Admitting: Family Medicine

## 2018-04-19 NOTE — Telephone Encounter (Signed)
Requested medication (s) are due for refill today: yes  Requested medication (s) are on the active medication list: yes  Last refill:  01/13/18  Future visit scheduled: yes  Notes to clinic:  This med not delegated to NT to refill   Requested Prescriptions  Pending Prescriptions Disp Refills   clonazePAM (KLONOPIN) 1 MG tablet [Pharmacy Med Name: CLONAZEPAM 1MG  TABLETS] 75 tablet     Sig: TAKE 1/2 TO 1 TABLET(0.5 TO 1 MG) BY MOUTH THREE TIMES DAILY AS NEEDED FOR ANXIETY     Not Delegated - Psychiatry:  Anxiolytics/Hypnotics Failed - 04/19/2018  8:59 AM      Failed - This refill cannot be delegated      Failed - Urine Drug Screen completed in last 360 days.      Passed - Valid encounter within last 6 months    Recent Outpatient Visits          3 months ago [place], [place], DO   5 months ago [place], [place], DO   8 months ago Routine general medical examination at a health care facility   [facility], [place] P, DO   10 months ago Insomnia, unspecified type   [place], [place], DO   1 year ago [place], [place], DO      Future Appointments            In 2 days Wynetta Emery, Barb Merino, DO [facility], [facility]

## 2018-04-21 ENCOUNTER — Other Ambulatory Visit: Payer: Self-pay

## 2018-04-21 ENCOUNTER — Ambulatory Visit (INDEPENDENT_AMBULATORY_CARE_PROVIDER_SITE_OTHER): Payer: BC Managed Care – PPO | Admitting: Family Medicine

## 2018-04-21 ENCOUNTER — Encounter: Payer: Self-pay | Admitting: Family Medicine

## 2018-04-21 VITALS — BP 132/86 | HR 101 | Temp 98.6°F | Ht 66.0 in | Wt 153.0 lb

## 2018-04-21 DIAGNOSIS — F419 Anxiety disorder, unspecified: Secondary | ICD-10-CM

## 2018-04-21 DIAGNOSIS — G4700 Insomnia, unspecified: Secondary | ICD-10-CM | POA: Diagnosis not present

## 2018-04-21 MED ORDER — DULOXETINE HCL 60 MG PO CPEP: 60.0000 mg | ORAL_CAPSULE | Freq: Every day | ORAL | 1 refills | Status: DC

## 2018-04-21 MED ORDER — SUVOREXANT 10 MG PO TABS: 10.0000 mg | ORAL_TABLET | Freq: Every evening | ORAL | 3 refills | Status: DC | PRN

## 2018-04-21 NOTE — Progress Notes (Signed)
BP 132/86   Pulse (!) 101   Temp 98.6 F (37 C) (Oral)   Ht 5\' 6"  (1.676 m)   Wt 153 lb (69.4 kg)   SpO2 97%   BMI 24.69 kg/m    Subjective:    Patient ID: Morgan Cisneros, female    DOB: 10/14/1965, 53 y.o.   MRN: [id number]  HPI: Morgan Cisneros is a 53 y.o. female  Chief Complaint  Patient presents with  . Anxiety    90m f/u  . Medication Refill    cymbalta and klonopin   ANXIETY/STRESS- still under a lot of stress- doing better than she was before the holidays. Still having issues with sleeping.  Duration:better Anxious mood: yes  Excessive worrying: yes Irritability: no  Sweating: no Nausea: no Palpitations:no Hyperventilation: no Panic attacks: no Agoraphobia: no  Obscessions/compulsions: no Depressed mood: no Depression screen [facility] 2/9 04/21/2018 01/13/2018 11/11/2017 08/11/2017 04/18/2017  Decreased Interest 2 1 3 1 2   Down, Depressed, Hopeless 2 1 2 1 1   PHQ - 2 Score 4 2 5 2 3   Altered sleeping 3 2 2 1 1   Tired, decreased energy 3 3 2 1 2   Change in appetite 0 0 2 0 0  Feeling bad or failure about yourself  0 0 0 1 0  Trouble concentrating 3 3 3 1 3   Moving slowly or fidgety/restless 2 0 0 0 0  Suicidal thoughts 0 0 0 0 0  PHQ-9 Score 15 10 14 6 9   Difficult doing work/chores Somewhat difficult Somewhat difficult Somewhat difficult - -   GAD 7 : Generalized Anxiety Score 04/21/2018 01/13/2018 11/11/2017 08/11/2017  Nervous, Anxious, on Edge 2 2 2 1   Control/stop worrying 3 3 2 2   Worry too much - different things 3 3 2 2   Trouble relaxing 3 3 2 2   Restless 2 3 2 2   Easily annoyed or irritable 0 2 0 1  Afraid - awful might happen 2 2 0 2  Total GAD 7 Score 15 18 10 12   Anxiety Difficulty Not difficult at all Somewhat difficult Somewhat difficult Somewhat difficult   GAD 7 : Generalized Anxiety Score 04/21/2018 01/13/2018 11/11/2017 08/11/2017  Nervous, Anxious, on Edge 2 2 2 1   Control/stop worrying 3 3 2 2   Worry too much - different things 3 3 2 2     Trouble relaxing 3 3 2 2   Restless 2 3 2 2   Easily annoyed or irritable 0 2 0 1  Afraid - awful might happen 2 2 0 2  Total GAD 7 Score 15 18 10 12   Anxiety Difficulty Not difficult at all Somewhat difficult Somewhat difficult Somewhat difficult   Anhedonia: no Weight changes: no Insomnia: yes hard to fall asleep- has been taking 1.5 of her klonopin daily to hep her sleep Hypersomnia: yes Fatigue/loss of energy: yes Feelings of worthlessness: no Feelings of guilt: no Impaired concentration/indecisiveness: no Suicidal ideations: no  Crying spells: no Recent Stressors/Life Changes: no   Relationship problems: no   Family stress: no     Financial stress: no    Job stress: no    Recent death/loss: no  Relevant past medical, surgical, family and social history reviewed and updated as indicated. Interim medical history since our last visit reviewed. Allergies and medications reviewed and updated.  Review of Systems  Constitutional: Negative.   Respiratory: Negative.   Cardiovascular: Negative.   Musculoskeletal: Negative.   Skin: Negative.   Neurological: Negative.   Psychiatric/Behavioral: Positive  for dysphoric mood and sleep disturbance. Negative for agitation, behavioral problems, confusion, decreased concentration, hallucinations, self-injury and suicidal ideas. The patient is nervous/anxious. The patient is not hyperactive.     Per HPI unless specifically indicated above     Objective:    BP 132/86   Pulse (!) 101   Temp 98.6 F (37 C) (Oral)   Ht 5\' 6"  (1.676 m)   Wt 153 lb (69.4 kg)   SpO2 97%   BMI 24.69 kg/m   Wt Readings from Last 3 Encounters:  04/21/18 153 lb (69.4 kg)  01/13/18 152 lb 4.8 oz (69.1 kg)  12/20/17 150 lb (68 kg)    Physical Exam Vitals signs and nursing note reviewed.  Constitutional:      General: She is not in acute distress.    Appearance: Normal appearance. She is not ill-appearing, toxic-appearing or diaphoretic.  HENT:      Head: Normocephalic and atraumatic.     Right Ear: External ear normal.     Left Ear: External ear normal.     Nose: Nose normal.     Mouth/Throat:     Mouth: Mucous membranes are moist.     Pharynx: Oropharynx is clear.  Eyes:     General: No scleral icterus.       Right eye: No discharge.        Left eye: No discharge.     Extraocular Movements: Extraocular movements intact.     Conjunctiva/sclera: Conjunctivae normal.     Pupils: Pupils are equal, round, and reactive to light.  Neck:     Musculoskeletal: Normal range of motion and neck supple.  Cardiovascular:     Rate and Rhythm: Normal rate and regular rhythm.     Pulses: Normal pulses.     Heart sounds: Normal heart sounds. No murmur. No friction rub. No gallop.   Pulmonary:     Effort: Pulmonary effort is normal. No respiratory distress.     Breath sounds: Normal breath sounds. No stridor. No wheezing, rhonchi or rales.  Chest:     Chest wall: No tenderness.  Musculoskeletal: Normal range of motion.  Skin:    General: Skin is warm and dry.     Capillary Refill: Capillary refill takes less than 2 seconds.     Coloration: Skin is not jaundiced or pale.     Findings: No bruising, erythema, lesion or rash.  Neurological:     General: No focal deficit present.     Mental Status: She is alert and oriented to person, place, and time. Mental status is at baseline.  Psychiatric:        Mood and Affect: Mood normal.        Behavior: Behavior normal.        Thought Content: Thought content normal.        Judgment: Judgment normal.     Results for orders placed or performed in visit on 08/11/17  Microscopic Examination  Result Value Ref Range   WBC, UA 0-5 0 - 5 /hpf   RBC, UA 0-2 0 - 2 /hpf   Epithelial Cells (non renal) 0-10 0 - 10 /hpf   Bacteria, UA Few None seen/Few  CBC with Differential/Platelet  Result Value Ref Range   WBC 5.1 3.4 - 10.8 x10E3/uL   RBC 4.28 3.77 - 5.28 x10E6/uL   Hemoglobin 13.7 11.1 - 15.9  g/dL   Hematocrit 41.0 34.0 - 46.6 %   MCV 96 79 - 97 fL  MCH 32.0 26.6 - 33.0 pg   MCHC 33.4 31.5 - 35.7 g/dL   RDW 14.7 12.3 - 15.4 %   Platelets 293 150 - 379 x10E3/uL   Neutrophils 48 Not Estab. %   Lymphs 41 Not Estab. %   Monocytes 7 Not Estab. %   Eos 4 Not Estab. %   Basos 0 Not Estab. %   Neutrophils Absolute 2.5 1.4 - 7.0 x10E3/uL   Lymphocytes Absolute 2.1 0.7 - 3.1 x10E3/uL   Monocytes Absolute 0.3 0.1 - 0.9 x10E3/uL   EOS (ABSOLUTE) 0.2 0.0 - 0.4 x10E3/uL   Basophils Absolute 0.0 0.0 - 0.2 x10E3/uL   Immature Granulocytes 0 Not Estab. %   Immature Grans (Abs) 0.0 0.0 - 0.1 x10E3/uL  Comprehensive metabolic panel  Result Value Ref Range   Glucose 84 65 - 99 mg/dL   BUN 20 6 - 24 mg/dL   Creatinine, Ser 0.67 0.57 - 1.00 mg/dL   GFR calc non Af Amer 103 >59 mL/min/1.73   GFR calc Af Amer 119 >59 mL/min/1.73   BUN/Creatinine Ratio 30 (H) 9 - 23   Sodium 144 134 - 144 mmol/L   Potassium 4.4 3.5 - 5.2 mmol/L   Chloride 106 96 - 106 mmol/L   CO2 24 20 - 29 mmol/L   Calcium 9.7 8.7 - 10.2 mg/dL   Total Protein 6.6 6.0 - 8.5 g/dL   Albumin 4.2 3.5 - 5.5 g/dL   Globulin, Total 2.4 1.5 - 4.5 g/dL   Albumin/Globulin Ratio 1.8 1.2 - 2.2   Bilirubin Total <0.2 0.0 - 1.2 mg/dL   Alkaline Phosphatase 123 (H) 39 - 117 IU/L   AST 21 0 - 40 IU/L   ALT 24 0 - 32 IU/L  Lipid Panel w/o Chol/HDL Ratio  Result Value Ref Range   Cholesterol, Total 212 (H) 100 - 199 mg/dL   Triglycerides 157 (H) 0 - 149 mg/dL   HDL 55 >39 mg/dL   VLDL Cholesterol Cal 31 5 - 40 mg/dL   LDL Calculated 126 (H) 0 - 99 mg/dL  TSH  Result Value Ref Range   TSH 1.120 0.450 - 4.500 uIU/mL  UA/M w/rflx Culture, Routine  Result Value Ref Range   Specific Gravity, UA 1.015 1.005 - 1.030   pH, UA 7.0 5.0 - 7.5   Color, UA Yellow Yellow   Appearance Ur Turbid (A) Clear   Leukocytes, UA Negative Negative   Protein, UA Negative Negative/Trace   Glucose, UA Negative Negative   Ketones, UA Negative  Negative   RBC, UA 1+ (A) Negative   Bilirubin, UA Negative Negative   Urobilinogen, Ur 0.2 0.2 - 1.0 mg/dL   Nitrite, UA Negative Negative   Microscopic Examination See below:       Assessment & Plan:   Problem List Items Addressed This Visit      Other   Insomnia - Primary    Discussed that she cannot take 1.5 of her klonopin. That is too much. Will try her on belsomra as her main issue is staying asleep rather than falling asleep. Recheck 1 month. Call with any concerns.       Anxiety    Stable on current regimen. Does not want to change her medicine right now. Will continue current regimen. Continue to monitor. Refills given.       Relevant Medications   DULoxetine (CYMBALTA) 60 MG capsule       Follow up plan: Return in about 4 weeks (around 05/19/2018) for  follow up insomnia.

## 2018-04-23 ENCOUNTER — Encounter: Payer: Self-pay | Admitting: Family Medicine

## 2018-04-23 NOTE — Assessment & Plan Note (Signed)
Discussed that she cannot take 1.5 of her klonopin. That is too much. Will try her on belsomra as her main issue is staying asleep rather than falling asleep. Recheck 1 month. Call with any concerns.

## 2018-04-23 NOTE — Assessment & Plan Note (Signed)
Stable on current regimen. Does not want to change her medicine right now. Will continue current regimen. Continue to monitor. Refills given.

## 2018-04-24 ENCOUNTER — Telehealth: Payer: Self-pay

## 2018-04-24 NOTE — Telephone Encounter (Signed)
Can we give her a coupon and see if she likes it before we push further? Should be one in the back

## 2018-04-24 NOTE — Telephone Encounter (Signed)
Patient notified. She is going to print off coupon and try the medication.

## 2018-04-24 NOTE — Telephone Encounter (Signed)
PA submitted and denied for Belsomra.

## 2018-04-25 ENCOUNTER — Encounter: Payer: Self-pay | Admitting: Family Medicine

## 2018-04-27 ENCOUNTER — Encounter: Payer: Self-pay | Admitting: Family Medicine

## 2018-05-04 ENCOUNTER — Encounter: Payer: Self-pay | Admitting: Family Medicine

## 2018-05-09 ENCOUNTER — Other Ambulatory Visit: Payer: Self-pay | Admitting: Family Medicine

## 2018-05-09 ENCOUNTER — Encounter: Payer: Self-pay | Admitting: Family Medicine

## 2018-05-09 ENCOUNTER — Telehealth: Payer: Self-pay | Admitting: Family Medicine

## 2018-05-09 NOTE — Telephone Encounter (Signed)
Patient was approved for new medication. Sent to the pharmacy.    Called patient to inform.

## 2018-05-10 ENCOUNTER — Encounter: Payer: Self-pay | Admitting: Family Medicine

## 2018-05-10 ENCOUNTER — Telehealth: Payer: Self-pay | Admitting: Family Medicine

## 2018-05-10 MED ORDER — SUCRALFATE 1 G PO TABS: 1.0000 g | ORAL_TABLET | Freq: Three times a day (TID) | ORAL | 3 refills | Status: DC

## 2018-05-10 NOTE — Telephone Encounter (Signed)
Error opened

## 2018-05-10 NOTE — Telephone Encounter (Signed)
Spoke with Ayris to let her know the message has been forwarded to Dr Wynetta Emery.

## 2018-05-10 NOTE — Telephone Encounter (Signed)
We had talked about the copay card. Is that with the copay card?

## 2018-05-10 NOTE — Telephone Encounter (Signed)
Copied from [place] [phone]. Topic: Quick Communication - See Telephone Encounter >> May 10, 2018 12:13 PM Rutherford Nail, NT wrote: CRM for notification. See Telephone encounter for: 05/10/18. Patient calling and states that the Suvorexant (BELSOMRA) 10 MG TABS that was sent to the pharmacy was going to cost her $453 before insurance and $205.76 after insurance. Patient states that she cannot afford to pay this every month. Would like to know if there is an alternative? Please advise. [facility] [place], [place]

## 2018-05-11 MED ORDER — ESZOPICLONE 2 MG PO TABS: 2.0000 mg | ORAL_TABLET | Freq: Every evening | ORAL | 3 refills | Status: DC | PRN

## 2018-05-11 NOTE — Telephone Encounter (Signed)
Pharmacy looked up a copay card, it will not help with the cost. Is there something different that she can try.

## 2018-05-11 NOTE — Telephone Encounter (Signed)
Patient notified

## 2018-05-11 NOTE — Telephone Encounter (Signed)
Let's try the lunesta. I'll send it through to her pharmacy

## 2018-05-18 ENCOUNTER — Encounter: Payer: Self-pay | Admitting: Family Medicine

## 2018-05-18 DIAGNOSIS — G4700 Insomnia, unspecified: Secondary | ICD-10-CM

## 2018-05-19 MED ORDER — ZOLPIDEM TARTRATE 10 MG PO TABS: 10.0000 mg | ORAL_TABLET | Freq: Every evening | ORAL | 0 refills | Status: DC | PRN

## 2018-05-23 ENCOUNTER — Ambulatory Visit (INDEPENDENT_AMBULATORY_CARE_PROVIDER_SITE_OTHER): Payer: BC Managed Care – PPO | Admitting: Unknown Physician Specialty

## 2018-05-23 ENCOUNTER — Ambulatory Visit: Payer: BC Managed Care – PPO | Admitting: Family Medicine

## 2018-05-23 ENCOUNTER — Encounter: Payer: Self-pay | Admitting: Unknown Physician Specialty

## 2018-05-23 DIAGNOSIS — G4700 Insomnia, unspecified: Secondary | ICD-10-CM | POA: Diagnosis not present

## 2018-05-23 MED ORDER — DULOXETINE HCL 60 MG PO CPEP: 60.0000 mg | ORAL_CAPSULE | Freq: Every day | ORAL | 1 refills | Status: DC

## 2018-05-23 MED ORDER — CLONAZEPAM 0.5 MG PO TABS: 0.5000 mg | ORAL_TABLET | Freq: Two times a day (BID) | ORAL | 0 refills | Status: DC | PRN

## 2018-05-23 NOTE — Progress Notes (Signed)
BP (!) 154/109 (BP Location: Right Arm, Cuff Size: Normal)   Pulse 97   Temp 98 F (36.7 C) (Oral)   Wt 148 lb 9.6 oz (67.4 kg)   SpO2 98%   BMI 23.98 kg/m    Subjective:    Patient ID: Morgan Cisneros, female    DOB: [birth year], 53 y.o.   MRN: [id number]  HPI: Morgan Cisneros is a 53 y.o. female  Chief Complaint  Patient presents with  . Medication Problem    pt states the lunesta is not helping with sleep and has also had diarrhea as well.  . Insomnia   Insomnia Pt states she tried Lunesta, with intolerable taste.  Switched to Ambient without any help.  Initially given Belsomra but insurance coverage a problem.  Reviewed last note with Dr. Wynetta Emery from 1/17 which notes insomnia started with stress.  Pt states she was on Klonopin 1 mg and was taking 1 1/2 at night. Therefore, switched to insomnia meds.  She is taking Cympalta but drugstore filled 40 mg vs 60 mg.  Pt states she goes to bed at 9.  With Ambien or Lunesta she wakes up at 1A.  States she is up for the rest of the night.  She states she spends the rest of the night due to the distress.   States she takes the Duloxetine QHS  Relevant past medical, surgical, family and social history reviewed and updated as indicated. Interim medical history since our last visit reviewed. Allergies and medications reviewed and updated.  Review of Systems  HENT: Negative.   Respiratory: Negative.   Cardiovascular: Positive for palpitations.  Gastrointestinal: Negative.     Per HPI unless specifically indicated above     Objective:    BP (!) 154/109 (BP Location: Right Arm, Cuff Size: Normal)   Pulse 97   Temp 98 F (36.7 C) (Oral)   Wt 148 lb 9.6 oz (67.4 kg)   SpO2 98%   BMI 23.98 kg/m   Wt Readings from Last 3 Encounters:  05/23/18 148 lb 9.6 oz (67.4 kg)  04/21/18 153 lb (69.4 kg)  01/13/18 152 lb 4.8 oz (69.1 kg)    Physical Exam Constitutional:      Appearance: She is well-developed.  HENT:     Head:  Normocephalic and atraumatic.  Eyes:     General: No scleral icterus.       Right eye: No discharge.        Left eye: No discharge.     Pupils: Pupils are equal, round, and reactive to light.  Neck:     Musculoskeletal: Normal range of motion and neck supple.     Thyroid: No thyromegaly.     Vascular: No carotid bruit.  Cardiovascular:     Rate and Rhythm: Normal rate and regular rhythm.     Heart sounds: Normal heart sounds. No murmur. No friction rub. No gallop.   Pulmonary:     Effort: Pulmonary effort is normal. No respiratory distress.     Breath sounds: Normal breath sounds. No wheezing or rales.  Abdominal:     General: Bowel sounds are normal.     Palpations: Abdomen is soft.     Tenderness: There is no abdominal tenderness. There is no rebound.  Musculoskeletal: Normal range of motion.  Lymphadenopathy:     Cervical: No cervical adenopathy.  Skin:    General: Skin is warm and dry.     Findings: No rash.  Neurological:  Mental Status: She is alert and oriented to person, place, and time.  Psychiatric:        Speech: Speech normal.        Behavior: Behavior normal.        Thought Content: Thought content normal.        Judgment: Judgment normal.      Assessment & Plan:   Problem List Items Addressed This Visit      Unprioritized   Insomnia    Significant problems with sleep. Taking Duloxetine at night, will switch to the AM.  ? Clonazepam withdrawal.  Will restart at 1/2 mg with #20 and gradually taper with 1/2 pill.  Discussed getting up out of bed when she can't sleep and limiting number of hours in bed.  OK to taper Ambien.  Recommended counseling to help with stress.  Recommended CBT          Follow up plan: Return for Keep appt with Dr. Wynetta Emery next week.

## 2018-05-23 NOTE — Assessment & Plan Note (Addendum)
Significant problems with sleep. Taking Duloxetine at night, will switch to the AM.  ? Clonazepam withdrawal.  Will restart at 1/2 mg with #20 and gradually taper with 1/2 pill.  Discussed getting up out of bed when she can't sleep and limiting number of hours in bed.  OK to taper Ambien.  Recommended counseling to help with stress.  Recommended CBT

## 2018-05-23 NOTE — Patient Instructions (Signed)
CBT for sleep.  [facility] or [employer]

## 2018-05-26 ENCOUNTER — Ambulatory Visit: Payer: BC Managed Care – PPO | Admitting: Family Medicine

## 2018-06-01 ENCOUNTER — Ambulatory Visit (INDEPENDENT_AMBULATORY_CARE_PROVIDER_SITE_OTHER): Payer: BC Managed Care – PPO | Admitting: Family Medicine

## 2018-06-01 ENCOUNTER — Encounter: Payer: Self-pay | Admitting: Family Medicine

## 2018-06-01 ENCOUNTER — Other Ambulatory Visit: Payer: Self-pay

## 2018-06-01 VITALS — BP 144/96 | HR 100 | Temp 98.3°F | Ht 66.0 in | Wt 153.1 lb

## 2018-06-01 DIAGNOSIS — F419 Anxiety disorder, unspecified: Secondary | ICD-10-CM | POA: Diagnosis not present

## 2018-06-01 DIAGNOSIS — G4700 Insomnia, unspecified: Secondary | ICD-10-CM | POA: Diagnosis not present

## 2018-06-01 DIAGNOSIS — Z1239 Encounter for other screening for malignant neoplasm of breast: Secondary | ICD-10-CM | POA: Diagnosis not present

## 2018-06-01 MED ORDER — ZOLPIDEM TARTRATE 10 MG PO TABS: 10.0000 mg | ORAL_TABLET | Freq: Every evening | ORAL | 5 refills | Status: DC | PRN

## 2018-06-01 MED ORDER — BUPROPION HCL ER (SR) 150 MG PO TB12
ORAL_TABLET | ORAL | 3 refills | Status: DC
Start: 2018-06-01 — End: 2018-06-05

## 2018-06-01 NOTE — Progress Notes (Signed)
BP (!) 144/96 (BP Location: Left Arm, Patient Position: Sitting, Cuff Size: Normal)   Pulse 100   Temp 98.3 F (36.8 C) (Oral)   Ht 5\' 6"  (1.676 m)   Wt 153 lb 2 oz (69.5 kg)   SpO2 97%   BMI 24.72 kg/m    Subjective:    Patient ID: Morgan Cisneros, female    DOB: 17-May-1965, 53 y.o.   MRN: [id number]  HPI: Morgan Cisneros is a 53 y.o. female  Chief Complaint  Patient presents with  . Insomnia    follow up  . Medicine   INSOMNIA- feeling much now. Not on the klonopin any more. Hadn't slept in 5 days. Changed her cymbalta to the morning. Doing great with the ambien. Feeling well. No concerns.  Duration: chronic Satisfied with sleep quality: yes Difficulty falling asleep: no Difficulty staying asleep: no Waking a few hours after sleep onset: no Early morning awakenings: no Daytime hypersomnolence: no Wakes feeling refreshed: no Good sleep hygiene: yes Apnea: no Snoring: no Depressed/anxious mood: yes Recent stress: yes Restless legs/nocturnal leg cramps: no Chronic pain/arthritis: no History of sleep study: no Treatments attempted: melatonin, uinsom, benadryl and ambien   ANXIETY/STRESS Duration:exacerbated Anxious mood: yes  Excessive worrying: yes Irritability: no  Sweating: no Nausea: no Palpitations:yes Hyperventilation: no Panic attacks: no Agoraphobia: no  Obscessions/compulsions: no Depressed mood: yes Depression screen [facility] 2/9 04/21/2018 01/13/2018 11/11/2017 08/11/2017 04/18/2017  Decreased Interest 2 1 3 1 2   Down, Depressed, Hopeless 2 1 2 1 1   PHQ - 2 Score 4 2 5 2 3   Altered sleeping 3 2 2 1 1   Tired, decreased energy 3 3 2 1 2   Change in appetite 0 0 2 0 0  Feeling bad or failure about yourself  0 0 0 1 0  Trouble concentrating 3 3 3 1 3   Moving slowly or fidgety/restless 2 0 0 0 0  Suicidal thoughts 0 0 0 0 0  PHQ-9 Score 15 10 14 6 9   Difficult doing work/chores Somewhat difficult Somewhat difficult Somewhat difficult - -    Anhedonia: no Weight changes: no Insomnia: yes   Hypersomnia: no Fatigue/loss of energy: no Feelings of worthlessness: no Feelings of guilt: no Impaired concentration/indecisiveness: no Suicidal ideations: no  Crying spells: yes Recent Stressors/Life Changes: yes   Relationship problems: yes   Family stress: yes     Financial stress: no    Job stress: no    Recent death/loss: no  Relevant past medical, surgical, family and social history reviewed and updated as indicated. Interim medical history since our last visit reviewed. Allergies and medications reviewed and updated.  Review of Systems  Constitutional: Negative.   Respiratory: Negative.   Cardiovascular: Negative.   Skin: Negative.   Neurological: Negative.   Psychiatric/Behavioral: Positive for dysphoric mood and sleep disturbance. Negative for agitation, behavioral problems, confusion, decreased concentration, hallucinations, self-injury and suicidal ideas. The patient is nervous/anxious. The patient is not hyperactive.     Per HPI unless specifically indicated above     Objective:    BP (!) 144/96 (BP Location: Left Arm, Patient Position: Sitting, Cuff Size: Normal)   Pulse 100   Temp 98.3 F (36.8 C) (Oral)   Ht 5\' 6"  (1.676 m)   Wt 153 lb 2 oz (69.5 kg)   SpO2 97%   BMI 24.72 kg/m   Wt Readings from Last 3 Encounters:  06/01/18 153 lb 2 oz (69.5 kg)  05/23/18 148 lb 9.6 oz (67.4  kg)  04/21/18 153 lb (69.4 kg)    Physical Exam Vitals signs and nursing note reviewed.  Constitutional:      General: She is not in acute distress.    Appearance: Normal appearance. She is not ill-appearing, toxic-appearing or diaphoretic.  HENT:     Head: Normocephalic and atraumatic.     Right Ear: External ear normal.     Left Ear: External ear normal.     Nose: Nose normal.     Mouth/Throat:     Mouth: Mucous membranes are moist.     Pharynx: Oropharynx is clear.  Eyes:     General: No scleral icterus.        Right eye: No discharge.        Left eye: No discharge.     Extraocular Movements: Extraocular movements intact.     Conjunctiva/sclera: Conjunctivae normal.     Pupils: Pupils are equal, round, and reactive to light.  Neck:     Musculoskeletal: Normal range of motion and neck supple.  Cardiovascular:     Rate and Rhythm: Normal rate and regular rhythm.     Pulses: Normal pulses.     Heart sounds: Normal heart sounds. No murmur. No friction rub. No gallop.   Pulmonary:     Effort: Pulmonary effort is normal. No respiratory distress.     Breath sounds: Normal breath sounds. No stridor. No wheezing, rhonchi or rales.  Chest:     Chest wall: No tenderness.  Musculoskeletal: Normal range of motion.  Skin:    General: Skin is warm and dry.     Capillary Refill: Capillary refill takes less than 2 seconds.     Coloration: Skin is not jaundiced or pale.     Findings: No bruising, erythema, lesion or rash.  Neurological:     General: No focal deficit present.     Mental Status: She is alert and oriented to person, place, and time. Mental status is at baseline.  Psychiatric:        Mood and Affect: Mood normal.        Behavior: Behavior normal.        Thought Content: Thought content normal.        Judgment: Judgment normal.     Results for orders placed or performed in visit on 08/11/17  Microscopic Examination  Result Value Ref Range   WBC, UA 0-5 0 - 5 /hpf   RBC, UA 0-2 0 - 2 /hpf   Epithelial Cells (non renal) 0-10 0 - 10 /hpf   Bacteria, UA Few None seen/Few  CBC with Differential/Platelet  Result Value Ref Range   WBC 5.1 3.4 - 10.8 x10E3/uL   RBC 4.28 3.77 - 5.28 x10E6/uL   Hemoglobin 13.7 11.1 - 15.9 g/dL   Hematocrit 41.0 34.0 - 46.6 %   MCV 96 79 - 97 fL   MCH 32.0 26.6 - 33.0 pg   MCHC 33.4 31.5 - 35.7 g/dL   RDW 14.7 12.3 - 15.4 %   Platelets 293 150 - 379 x10E3/uL   Neutrophils 48 Not Estab. %   Lymphs 41 Not Estab. %   Monocytes 7 Not Estab. %   Eos 4 Not  Estab. %   Basos 0 Not Estab. %   Neutrophils Absolute 2.5 1.4 - 7.0 x10E3/uL   Lymphocytes Absolute 2.1 0.7 - 3.1 x10E3/uL   Monocytes Absolute 0.3 0.1 - 0.9 x10E3/uL   EOS (ABSOLUTE) 0.2 0.0 - 0.4 x10E3/uL   Basophils Absolute  0.0 0.0 - 0.2 x10E3/uL   Immature Granulocytes 0 Not Estab. %   Immature Grans (Abs) 0.0 0.0 - 0.1 x10E3/uL  Comprehensive metabolic panel  Result Value Ref Range   Glucose 84 65 - 99 mg/dL   BUN 20 6 - 24 mg/dL   Creatinine, Ser 0.67 0.57 - 1.00 mg/dL   GFR calc non Af Amer 103 >59 mL/min/1.73   GFR calc Af Amer 119 >59 mL/min/1.73   BUN/Creatinine Ratio 30 (H) 9 - 23   Sodium 144 134 - 144 mmol/L   Potassium 4.4 3.5 - 5.2 mmol/L   Chloride 106 96 - 106 mmol/L   CO2 24 20 - 29 mmol/L   Calcium 9.7 8.7 - 10.2 mg/dL   Total Protein 6.6 6.0 - 8.5 g/dL   Albumin 4.2 3.5 - 5.5 g/dL   Globulin, Total 2.4 1.5 - 4.5 g/dL   Albumin/Globulin Ratio 1.8 1.2 - 2.2   Bilirubin Total <0.2 0.0 - 1.2 mg/dL   Alkaline Phosphatase 123 (H) 39 - 117 IU/L   AST 21 0 - 40 IU/L   ALT 24 0 - 32 IU/L  Lipid Panel w/o Chol/HDL Ratio  Result Value Ref Range   Cholesterol, Total 212 (H) 100 - 199 mg/dL   Triglycerides 157 (H) 0 - 149 mg/dL   HDL 55 >39 mg/dL   VLDL Cholesterol Cal 31 5 - 40 mg/dL   LDL Calculated 126 (H) 0 - 99 mg/dL  TSH  Result Value Ref Range   TSH 1.120 0.450 - 4.500 uIU/mL  UA/M w/rflx Culture, Routine  Result Value Ref Range   Specific Gravity, UA 1.015 1.005 - 1.030   pH, UA 7.0 5.0 - 7.5   Color, UA Yellow Yellow   Appearance Ur Turbid (A) Clear   Leukocytes, UA Negative Negative   Protein, UA Negative Negative/Trace   Glucose, UA Negative Negative   Ketones, UA Negative Negative   RBC, UA 1+ (A) Negative   Bilirubin, UA Negative Negative   Urobilinogen, Ur 0.2 0.2 - 1.0 mg/dL   Nitrite, UA Negative Negative   Microscopic Examination See below:       Assessment & Plan:   Problem List Items Addressed This Visit      Other   Insomnia -  Primary    Doing much better on her ambien. Feeling like herself. Call with any concerns. Refills given today.      Anxiety    Not doing great. Will start wellbutrin and continue cymbalta. Call with any concerns. Recheck 1 month.       Relevant Medications   buPROPion (WELLBUTRIN SR) 150 MG 12 hr tablet    Other Visit Diagnoses    Screening for breast cancer       Mammogram ordered today.   Relevant Orders   MM DIGITAL SCREENING BILATERAL       Follow up plan: Return in about 4 weeks (around 06/29/2018).

## 2018-06-01 NOTE — Assessment & Plan Note (Signed)
Doing much better on her ambien. Feeling like herself. Call with any concerns. Refills given today.

## 2018-06-01 NOTE — Assessment & Plan Note (Signed)
Not doing great. Will start wellbutrin and continue cymbalta. Call with any concerns. Recheck 1 month.

## 2018-06-05 ENCOUNTER — Other Ambulatory Visit: Payer: Self-pay | Admitting: Family Medicine

## 2018-06-05 ENCOUNTER — Encounter: Payer: Self-pay | Admitting: Family Medicine

## 2018-06-05 IMAGING — CT CT ABD-PELV W/ CM
2 of 5 series · 15 of 46 positions shown, 17 images · IV contrast (APPLIED)
Comparison: 10/22/2016, 02/01/2012

CLINICAL DATA: 50-year-old female with a history of left lower
quadrant abdominal pain

EXAM:
CT ABDOMEN AND PELVIS WITH CONTRAST
TECHNIQUE: Multidetector CT imaging of the abdomen and pelvis was performed
using the standard protocol following bolus administration of
intravenous contrast.
CONTRAST:  100mL ZXHKYW-YMM IOPAMIDOL (ZXHKYW-YMM) INJECTION 61%

[Series 2: axial st · axial · 0.71mm/px · z∈[-452,-37]mm · 12 of 95 slices shown, 14 images]
[im 6/95  soft-tissue]
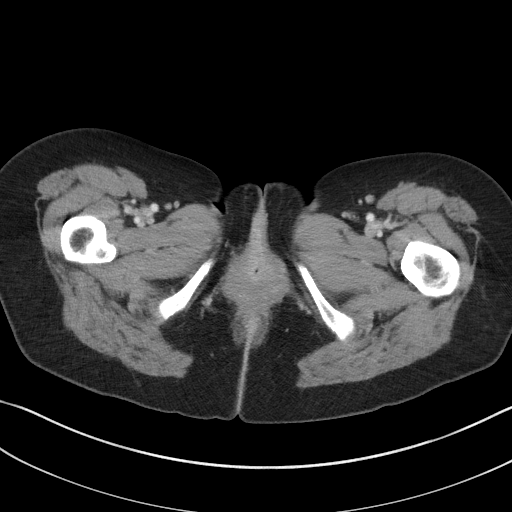
[im 6/95  bone]
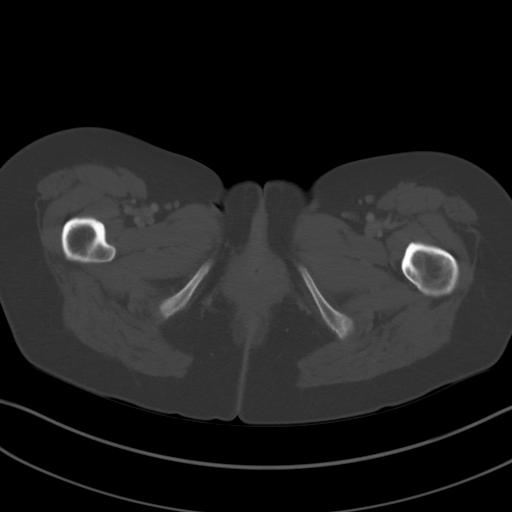
[im 16/95  soft-tissue]
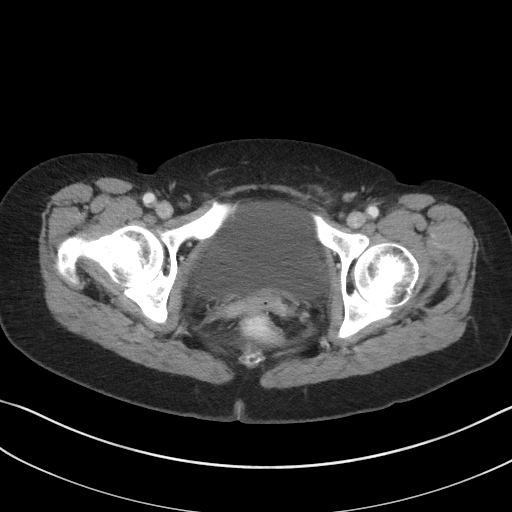
[im 21/95  soft-tissue]
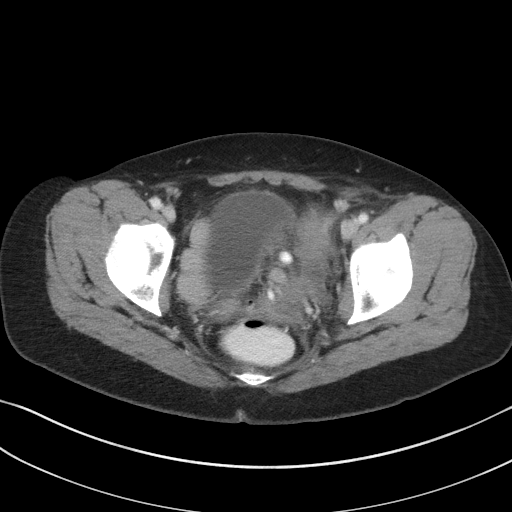
[im 27/95  soft-tissue]
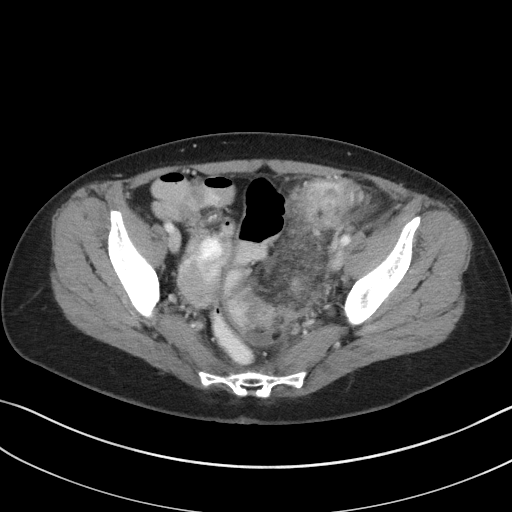
[im 37/95  soft-tissue]
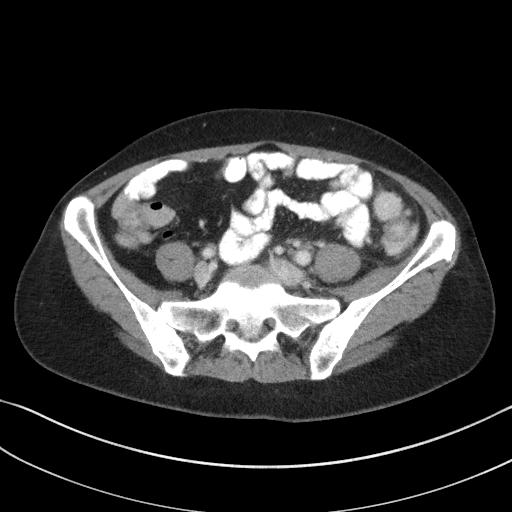
[im 42/95  soft-tissue]
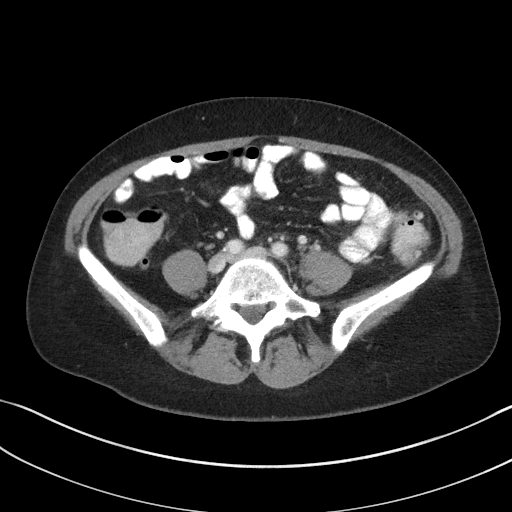
[im 53/95  soft-tissue]
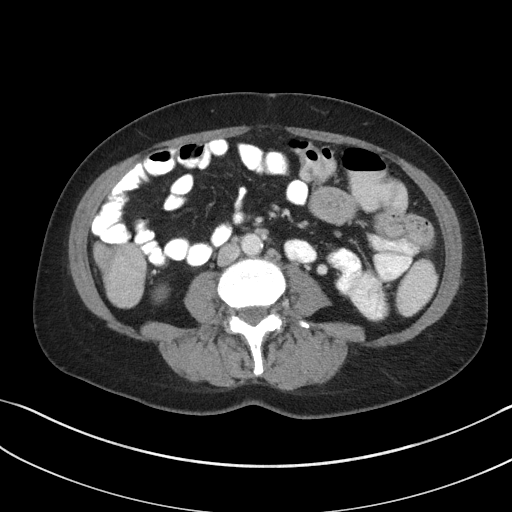
[im 58/95  soft-tissue]
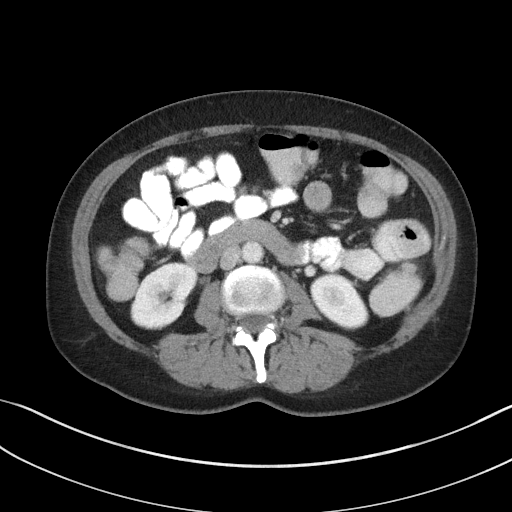
[im 68/95  soft-tissue]
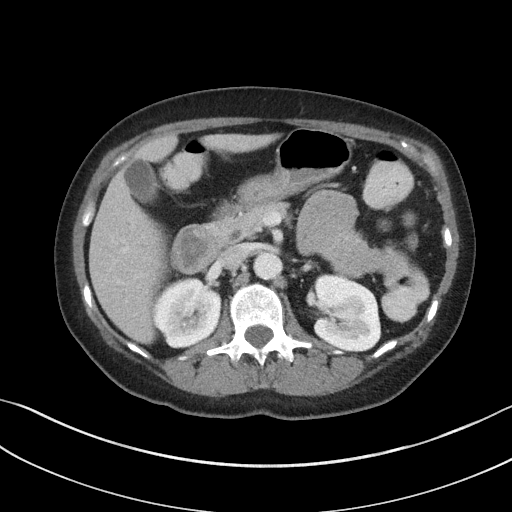
[im 68/95  bone]
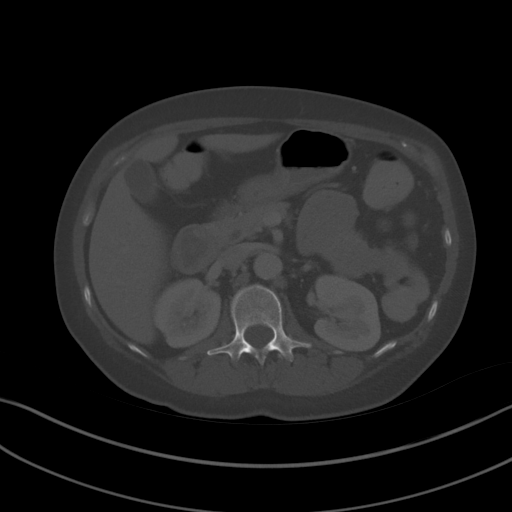
[im 74/95  soft-tissue]
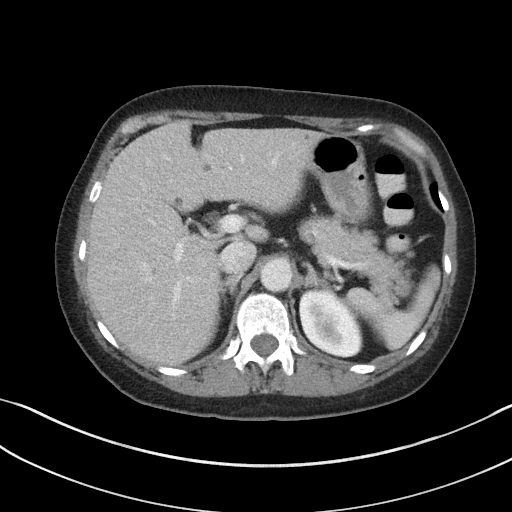
[im 79/95  soft-tissue]
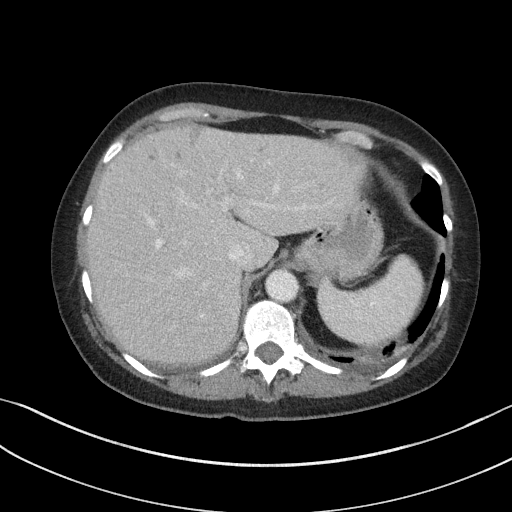
[im 89/95  soft-tissue]
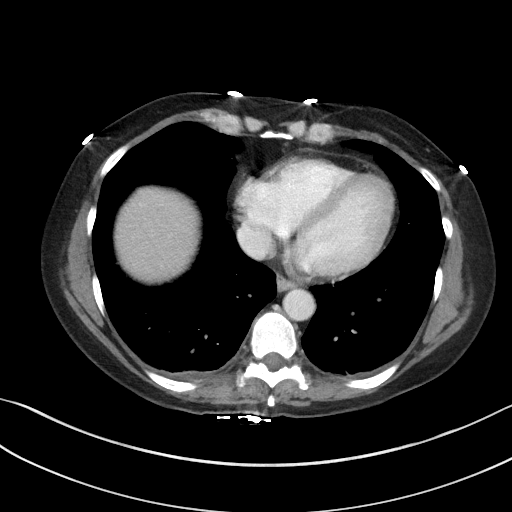

[Series 5: coronal st · coronal · 0.68mm/px · 3 of 74 slices shown]
[im 25/74  soft-tissue]
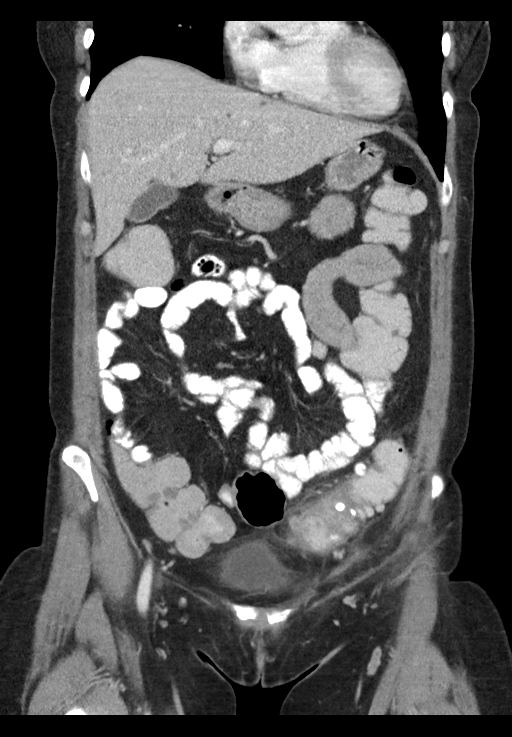
[im 33/74  soft-tissue]
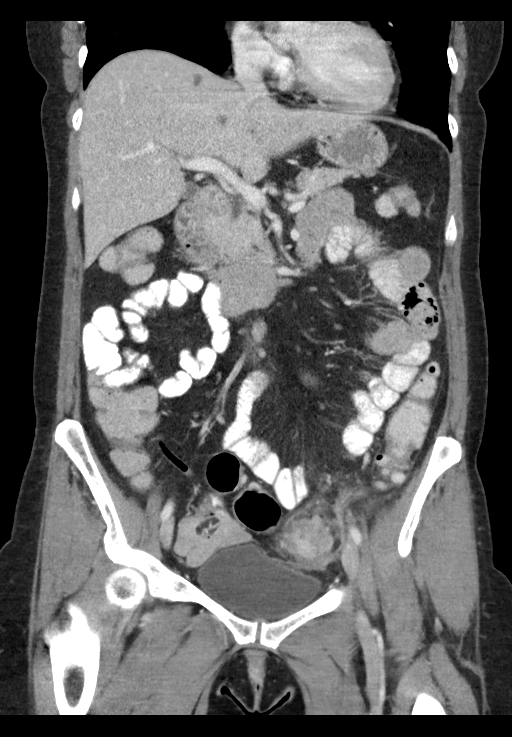
[im 41/74  soft-tissue]
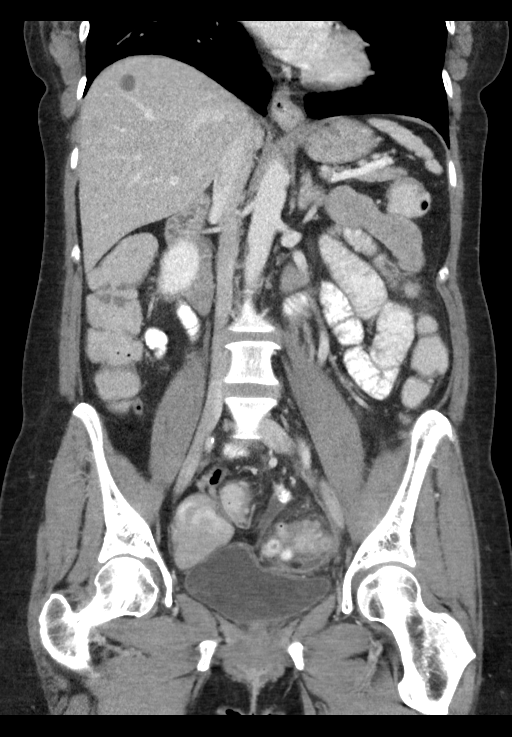

[15 of 46 positions shown; findings below may reference images not displayed]

FINDINGS: Lower chest: Mild pericardial fluid/ thickening.

Hepatobiliary: Multiple low-density rounded cystic structures of
liver parenchyma. Number and size relatively unchanged dating to the
CT 02/01/2012. Unremarkable appearance of gallbladder.

Pancreas: Unremarkable pancreas

Spleen: Unremarkable spleen

Adrenals/Urinary Tract: Unremarkable bilateral adrenal glands.

Nonobstructive right-sided nephrolithiasis, with several small
stones. No hydronephrosis. Unremarkable course of the right ureter.

Partial duplication of the left collecting system. No
hydronephrosis. Single tiny stone at the lower pole collecting
system. Unremarkable course of the left ureter.

Stomach/Bowel: Inflammatory changes involving the sigmoid colon and
the adjacent mesenteric fat, unchanged in distribution from the
comparison CT. While there is free fluid within the peritoneal
leaves, there is no evidence of abscess. No extraluminal gas.

Surgical suture line at the rectum.

Normal appendix.

No abnormally distended small bowel to indicate obstruction.

Vascular/Lymphatic: No aneurysm. Minimal calcifications of the
abdominal aorta. Proximal femoral vasculature patent. Mesenteric
vasculature patent. Bilateral renal arteries patent.

Reproductive: Hysterectomy

Other: No abdominal hernia.

Musculoskeletal: No acute fracture. No significant degenerative
changes.
IMPRESSION: CT again demonstrates acute sigmoid diverticulitis, with adjacent
inflammatory changes and no evidence of abscess or perforation.

Surgical suture line at the rectum. Recommend correlation with
patient surgical history.

Bilateral nonobstructive nephrolithiasis.

Aortic Atherosclerosis ([id number]).

## 2018-06-05 MED ORDER — ARIPIPRAZOLE 2 MG PO TABS: 2.0000 mg | ORAL_TABLET | Freq: Every day | ORAL | 3 refills | Status: DC

## 2018-06-09 ENCOUNTER — Ambulatory Visit (INDEPENDENT_AMBULATORY_CARE_PROVIDER_SITE_OTHER): Payer: BC Managed Care – PPO | Admitting: Family Medicine

## 2018-06-09 ENCOUNTER — Encounter: Payer: Self-pay | Admitting: Family Medicine

## 2018-06-09 ENCOUNTER — Ambulatory Visit: Payer: Self-pay | Admitting: *Deleted

## 2018-06-09 VITALS — BP 132/108 | HR 81 | Temp 98.1°F | Wt 148.6 lb

## 2018-06-09 DIAGNOSIS — R030 Elevated blood-pressure reading, without diagnosis of hypertension: Secondary | ICD-10-CM | POA: Diagnosis not present

## 2018-06-09 MED ORDER — LISINOPRIL 5 MG PO TABS: 5.0000 mg | ORAL_TABLET | Freq: Every day | ORAL | 0 refills | Status: DC

## 2018-06-09 NOTE — Telephone Encounter (Signed)
Patient is calling with concerns of elevated BP- she states she has been having headaches as well. Patient states her BP last night was 176/114. This morning her reading are lower. Appointment made to check BP and discuss possible need for medication.  Reason for Disposition . Systolic BP  >= 017 OR Diastolic >= 494  Answer Assessment - Initial Assessment Questions 1. BLOOD PRESSURE: "What is the blood pressure?" "Did you take at least two measurements 5 minutes apart?"     138/104, 128/100 2. ONSET: "When did you take your blood pressure?"     8:30 3. HOW: "How did you obtain the blood pressure?" (e.g., visiting nurse, automatic home BP monitor)     Automatic cuff 4. HISTORY: "Do you have a history of high blood pressure?"     no 5. MEDICATIONS: "Are you taking any medications for blood pressure?" "Have you missed any doses recently?"     no 6. OTHER SYMPTOMS: "Do you have any symptoms?" (e.g., headache, chest pain, blurred vision, difficulty breathing, weakness)     Headache, tunnel hearing, palpations- panic 7. PREGNANCY: "Is there any chance you are pregnant?" "When was your last menstrual period?"     n/a  Protocols used: HIGH BLOOD PRESSURE-A-AH

## 2018-06-09 NOTE — Progress Notes (Signed)
BP (!) 132/108   Pulse 81   Temp 98.1 F (36.7 C) (Oral)   Wt 148 lb 9.6 oz (67.4 kg)   SpO2 98%   BMI 23.98 kg/m    Subjective:    Patient ID: Morgan Cisneros, female    DOB: [birth year], 53 y.o.   MRN: [id number]  HPI: Morgan Cisneros is a 53 y.o. female  Chief Complaint  Patient presents with  . Hypertension    pt states her BP has been elevated at home for the past month. Has been keeping a log.    Constant headache, not sleeping well x 1 month since having a medication reaction to lunesta. Home BPs ranging from 130s/80s-170s/110s. Has never had a BP issue in the past or been on antihypertensive medications. Denies CP, SOB, dizziness.   Relevant past medical, surgical, family and social history reviewed and updated as indicated. Interim medical history since our last visit reviewed. Allergies and medications reviewed and updated.  Review of Systems  Per HPI unless specifically indicated above     Objective:    BP (!) 132/108   Pulse 81   Temp 98.1 F (36.7 C) (Oral)   Wt 148 lb 9.6 oz (67.4 kg)   SpO2 98%   BMI 23.98 kg/m   Wt Readings from Last 3 Encounters:  06/09/18 148 lb 9.6 oz (67.4 kg)  06/01/18 153 lb 2 oz (69.5 kg)  05/23/18 148 lb 9.6 oz (67.4 kg)    Physical Exam Vitals signs and nursing note reviewed.  Constitutional:      Appearance: Normal appearance. She is not ill-appearing.  HENT:     Head: Atraumatic.  Eyes:     Extraocular Movements: Extraocular movements intact.     Conjunctiva/sclera: Conjunctivae normal.  Neck:     Musculoskeletal: Normal range of motion and neck supple.  Cardiovascular:     Rate and Rhythm: Normal rate and regular rhythm.     Heart sounds: Normal heart sounds.  Pulmonary:     Effort: Pulmonary effort is normal.     Breath sounds: Normal breath sounds.  Musculoskeletal: Normal range of motion.  Skin:    General: Skin is warm and dry.  Neurological:     Mental Status: She is alert and oriented to  person, place, and time.  Psychiatric:        Mood and Affect: Mood normal.        Thought Content: Thought content normal.        Judgment: Judgment normal.     Results for orders placed or performed in visit on 06/09/18  CBC with Differential/Platelet  Result Value Ref Range   WBC 7.3 3.4 - 10.8 x10E3/uL   RBC 4.62 3.77 - 5.28 x10E6/uL   Hemoglobin 15.4 11.1 - 15.9 g/dL   Hematocrit 45.2 34.0 - 46.6 %   MCV 98 (H) 79 - 97 fL   MCH 33.3 (H) 26.6 - 33.0 pg   MCHC 34.1 31.5 - 35.7 g/dL   RDW 13.1 11.7 - 15.4 %   Platelets 344 150 - 450 x10E3/uL   Neutrophils 63 Not Estab. %   Lymphs 29 Not Estab. %   Monocytes 5 Not Estab. %   Eos 2 Not Estab. %   Basos 1 Not Estab. %   Neutrophils Absolute 4.7 1.4 - 7.0 x10E3/uL   Lymphocytes Absolute 2.1 0.7 - 3.1 x10E3/uL   Monocytes Absolute 0.4 0.1 - 0.9 x10E3/uL   EOS (ABSOLUTE) 0.1 0.0 - 0.4  x10E3/uL   Basophils Absolute 0.0 0.0 - 0.2 x10E3/uL   Immature Granulocytes 0 Not Estab. %   Immature Grans (Abs) 0.0 0.0 - 0.1 x10E3/uL  Comprehensive metabolic panel  Result Value Ref Range   Glucose 87 65 - 99 mg/dL   BUN 13 6 - 24 mg/dL   Creatinine, Ser 0.65 0.57 - 1.00 mg/dL   GFR calc non Af Amer 103 >59 mL/min/1.73   GFR calc Af Amer 119 >59 mL/min/1.73   BUN/Creatinine Ratio 20 9 - 23   Sodium 144 134 - 144 mmol/L   Potassium 4.5 3.5 - 5.2 mmol/L   Chloride 102 96 - 106 mmol/L   CO2 23 20 - 29 mmol/L   Calcium 10.0 8.7 - 10.2 mg/dL   Total Protein 7.2 6.0 - 8.5 g/dL   Albumin 4.9 3.8 - 4.9 g/dL   Globulin, Total 2.3 1.5 - 4.5 g/dL   Albumin/Globulin Ratio 2.1 1.2 - 2.2   Bilirubin Total 0.3 0.0 - 1.2 mg/dL   Alkaline Phosphatase 153 (H) 39 - 117 IU/L   AST 25 0 - 40 IU/L   ALT 38 (H) 0 - 32 IU/L  TSH  Result Value Ref Range   TSH 1.640 0.450 - 4.500 uIU/mL      Assessment & Plan:   Problem List Items Addressed This Visit    None    Visit Diagnoses    Elevated blood pressure reading without diagnosis of hypertension     -  Primary   Will start low dose lisinopril and monitor closely for benefit. DASH diet, exercise, controlling stress levels reviewed. Will continue home BP log   Relevant Orders   CBC with Differential/Platelet (Completed)   Comprehensive metabolic panel (Completed)   TSH (Completed)       Follow up plan: Return in about 4 weeks (around 07/07/2018) for BP.

## 2018-06-09 NOTE — Telephone Encounter (Signed)
Will see her at her appointment 

## 2018-06-10 LAB — CBC WITH DIFFERENTIAL/PLATELET
BASOS ABS: 0 10*3/uL (ref 0.0–0.2)
BASOS: 1 %
EOS (ABSOLUTE): 0.1 10*3/uL (ref 0.0–0.4)
Eos: 2 %
Hematocrit: 45.2 % (ref 34.0–46.6)
Hemoglobin: 15.4 g/dL (ref 11.1–15.9)
IMMATURE GRANS (ABS): 0 10*3/uL (ref 0.0–0.1)
IMMATURE GRANULOCYTES: 0 %
LYMPHS: 29 %
Lymphocytes Absolute: 2.1 10*3/uL (ref 0.7–3.1)
MCH: 33.3 pg — ABNORMAL HIGH (ref 26.6–33.0)
MCHC: 34.1 g/dL (ref 31.5–35.7)
MCV: 98 fL — AB (ref 79–97)
MONOS ABS: 0.4 10*3/uL (ref 0.1–0.9)
Monocytes: 5 %
NEUTROS PCT: 63 %
Neutrophils Absolute: 4.7 10*3/uL (ref 1.4–7.0)
PLATELETS: 344 10*3/uL (ref 150–450)
RBC: 4.62 x10E6/uL (ref 3.77–5.28)
RDW: 13.1 % (ref 11.7–15.4)
WBC: 7.3 10*3/uL (ref 3.4–10.8)

## 2018-06-10 LAB — COMPREHENSIVE METABOLIC PANEL
A/G RATIO: 2.1 (ref 1.2–2.2)
ALT: 38 IU/L — AB (ref 0–32)
AST: 25 IU/L (ref 0–40)
Albumin: 4.9 g/dL (ref 3.8–4.9)
Alkaline Phosphatase: 153 IU/L — ABNORMAL HIGH (ref 39–117)
BUN/Creatinine Ratio: 20 (ref 9–23)
BUN: 13 mg/dL (ref 6–24)
Bilirubin Total: 0.3 mg/dL (ref 0.0–1.2)
CALCIUM: 10 mg/dL (ref 8.7–10.2)
CO2: 23 mmol/L (ref 20–29)
Chloride: 102 mmol/L (ref 96–106)
Creatinine, Ser: 0.65 mg/dL (ref 0.57–1.00)
GFR, EST AFRICAN AMERICAN: 119 mL/min/{1.73_m2} (ref >59)
GFR, EST NON AFRICAN AMERICAN: 103 mL/min/{1.73_m2} (ref >59)
GLOBULIN, TOTAL: 2.3 g/dL (ref 1.5–4.5)
Glucose: 87 mg/dL (ref 65–99)
POTASSIUM: 4.5 mmol/L (ref 3.5–5.2)
Sodium: 144 mmol/L (ref 134–144)
TOTAL PROTEIN: 7.2 g/dL (ref 6.0–8.5)

## 2018-06-10 LAB — TSH: TSH: 1.64 u[IU]/mL (ref 0.450–4.500)

## 2018-06-14 ENCOUNTER — Other Ambulatory Visit: Payer: Self-pay | Admitting: Family Medicine

## 2018-06-14 DIAGNOSIS — Z1231 Encounter for screening mammogram for malignant neoplasm of breast: Secondary | ICD-10-CM

## 2018-06-15 ENCOUNTER — Other Ambulatory Visit: Payer: Self-pay | Admitting: Family Medicine

## 2018-06-15 NOTE — Telephone Encounter (Signed)
Requested medication (s) are due for refill today: no  Requested medication (s) are on the active medication list: yes    Last refill: 06/01/2018  #30  5 refills  Future visit scheduled yes  06/23/2018  Notes to clinic:not delegated  Requested Prescriptions  Pending Prescriptions Disp Refills   zolpidem (AMBIEN) 10 MG tablet [Pharmacy Med Name: ZOLPIDEM 10MG  TABLETS] 30 tablet     Sig: TAKE 1 TABLET(10 MG) BY MOUTH AT BEDTIME AS NEEDED FOR SLEEP     Not Delegated - Psychiatry:  Anxiolytics/Hypnotics Failed - 06/15/2018 10:18 AM      Failed - This refill cannot be delegated      Failed - Urine Drug Screen completed in last 360 days.      Passed - Valid encounter within last 6 months    Recent Outpatient Visits          6 days ago Elevated blood pressure reading without diagnosis of hypertension   [facility] Merrie Roof [place], [place]   2 weeks ago Insomnia, unspecified type   [place], Megan P, DO   3 weeks ago Insomnia, unspecified type   [place], [place]   1 month ago Insomnia, unspecified type   [place], [place], DO   5 months ago [place], Barb Merino, DO      Future Appointments            In 1 week Orene Desanctis, Lilia Argue, [place], [place]

## 2018-06-23 ENCOUNTER — Other Ambulatory Visit: Payer: Self-pay

## 2018-06-23 ENCOUNTER — Encounter: Payer: Self-pay | Admitting: Family Medicine

## 2018-06-23 ENCOUNTER — Ambulatory Visit (INDEPENDENT_AMBULATORY_CARE_PROVIDER_SITE_OTHER): Payer: BC Managed Care – PPO | Admitting: Family Medicine

## 2018-06-23 VITALS — BP 128/87 | HR 86 | Temp 98.4°F

## 2018-06-23 DIAGNOSIS — R002 Palpitations: Secondary | ICD-10-CM | POA: Diagnosis not present

## 2018-06-23 DIAGNOSIS — I10 Essential (primary) hypertension: Secondary | ICD-10-CM

## 2018-06-23 DIAGNOSIS — F339 Major depressive disorder, recurrent, unspecified: Secondary | ICD-10-CM

## 2018-06-23 MED ORDER — METOPROLOL SUCCINATE ER 25 MG PO TB24: 25.0000 mg | ORAL_TABLET | Freq: Every day | ORAL | 0 refills | Status: DC

## 2018-06-23 MED ORDER — LISINOPRIL 5 MG PO TABS: 5.0000 mg | ORAL_TABLET | Freq: Every day | ORAL | 0 refills | Status: DC

## 2018-06-23 NOTE — Progress Notes (Signed)
BP 128/87   Pulse 86   Temp 98.4 F (36.9 C) (Oral)   SpO2 98%    Subjective:    Patient ID: Morgan Cisneros, female    DOB: Jun 30, 1965, 53 y.o.   MRN: [id number]  HPI: Morgan Cisneros is a 53 y.o. female  Chief Complaint  Patient presents with  . Hypertension    pt states she thinks the abilify may be making her BP be elevated    Here today for BP f/u. Has been taking 5 mg lisinopril faithfully without side effects. Home BPs have been running 130s-150s/80s-110s when checked at home. Notes she thinks the abilify may be making her BP elevated and she held the medicine today and notes better BPs. Does not want to resume. Feeling well off of it. Denies CP, SOB, HAs, dizziness. Does note intermittent palpitations which she's had for years. Her husband takes atenolol for these and notes improvement, interested in trying something like that.    Depression screen [facility] 2/9 04/21/2018 01/13/2018 11/11/2017  Decreased Interest 2 1 3   Down, Depressed, Hopeless 2 1 2   PHQ - 2 Score 4 2 5   Altered sleeping 3 2 2   Tired, decreased energy 3 3 2   Change in appetite 0 0 2  Feeling bad or failure about yourself  0 0 0  Trouble concentrating 3 3 3   Moving slowly or fidgety/restless 2 0 0  Suicidal thoughts 0 0 0  PHQ-9 Score 15 10 14   Difficult doing work/chores Somewhat difficult Somewhat difficult Somewhat difficult    Relevant past medical, surgical, family and social history reviewed and updated as indicated. Interim medical history since our last visit reviewed. Allergies and medications reviewed and updated.  Review of Systems  Per HPI unless specifically indicated above     Objective:    BP 128/87   Pulse 86   Temp 98.4 F (36.9 C) (Oral)   SpO2 98%   Wt Readings from Last 3 Encounters:  06/09/18 148 lb 9.6 oz (67.4 kg)  06/01/18 153 lb 2 oz (69.5 kg)  05/23/18 148 lb 9.6 oz (67.4 kg)    Physical Exam Vitals signs and nursing note reviewed.  Constitutional:    Appearance: Normal appearance. She is not ill-appearing.  HENT:     Head: Atraumatic.  Eyes:     Extraocular Movements: Extraocular movements intact.     Conjunctiva/sclera: Conjunctivae normal.  Neck:     Musculoskeletal: Normal range of motion and neck supple.  Cardiovascular:     Rate and Rhythm: Normal rate and regular rhythm.     Heart sounds: Normal heart sounds.  Pulmonary:     Effort: Pulmonary effort is normal.     Breath sounds: Normal breath sounds.  Musculoskeletal: Normal range of motion.  Skin:    General: Skin is warm and dry.  Neurological:     Mental Status: She is alert and oriented to person, place, and time.  Psychiatric:        Mood and Affect: Mood normal.        Thought Content: Thought content normal.        Judgment: Judgment normal.     Results for orders placed or performed in visit on 06/09/18  CBC with Differential/Platelet  Result Value Ref Range   WBC 7.3 3.4 - 10.8 x10E3/uL   RBC 4.62 3.77 - 5.28 x10E6/uL   Hemoglobin 15.4 11.1 - 15.9 g/dL   Hematocrit 45.2 34.0 - 46.6 %   MCV 98 (  H) 79 - 97 fL   MCH 33.3 (H) 26.6 - 33.0 pg   MCHC 34.1 31.5 - 35.7 g/dL   RDW 13.1 11.7 - 15.4 %   Platelets 344 150 - 450 x10E3/uL   Neutrophils 63 Not Estab. %   Lymphs 29 Not Estab. %   Monocytes 5 Not Estab. %   Eos 2 Not Estab. %   Basos 1 Not Estab. %   Neutrophils Absolute 4.7 1.4 - 7.0 x10E3/uL   Lymphocytes Absolute 2.1 0.7 - 3.1 x10E3/uL   Monocytes Absolute 0.4 0.1 - 0.9 x10E3/uL   EOS (ABSOLUTE) 0.1 0.0 - 0.4 x10E3/uL   Basophils Absolute 0.0 0.0 - 0.2 x10E3/uL   Immature Granulocytes 0 Not Estab. %   Immature Grans (Abs) 0.0 0.0 - 0.1 x10E3/uL  Comprehensive metabolic panel  Result Value Ref Range   Glucose 87 65 - 99 mg/dL   BUN 13 6 - 24 mg/dL   Creatinine, Ser 0.65 0.57 - 1.00 mg/dL   GFR calc non Af Amer 103 >59 mL/min/1.73   GFR calc Af Amer 119 >59 mL/min/1.73   BUN/Creatinine Ratio 20 9 - 23   Sodium 144 134 - 144 mmol/L    Potassium 4.5 3.5 - 5.2 mmol/L   Chloride 102 96 - 106 mmol/L   CO2 23 20 - 29 mmol/L   Calcium 10.0 8.7 - 10.2 mg/dL   Total Protein 7.2 6.0 - 8.5 g/dL   Albumin 4.9 3.8 - 4.9 g/dL   Globulin, Total 2.3 1.5 - 4.5 g/dL   Albumin/Globulin Ratio 2.1 1.2 - 2.2   Bilirubin Total 0.3 0.0 - 1.2 mg/dL   Alkaline Phosphatase 153 (H) 39 - 117 IU/L   AST 25 0 - 40 IU/L   ALT 38 (H) 0 - 32 IU/L  TSH  Result Value Ref Range   TSH 1.640 0.450 - 4.500 uIU/mL      Assessment & Plan:   Problem List Items Addressed This Visit      Cardiovascular and Mediastinum   Essential hypertension - Primary    Will add metoprolol to help with her palpitations. Continue lisinopril and home logs. Will recheck and adjust as needed. DASH diet, exercise, stress reduction reviewed      Relevant Medications   metoprolol succinate (TOPROL-XL) 25 MG 24 hr tablet   lisinopril (PRINIVIL,ZESTRIL) 5 MG tablet     Other   Depression, recurrent (HCC)    Self d/c'd abilify, continue off the medicine and f/u if moods worsening. Continue cymbalta       Other Visit Diagnoses    Palpitations       Declines EKG or Cardiology referral at this time. Will start low dose metoprolol and monitor closely. Risks and benefits reviewed, log home BP and HRs       Follow up plan: Return in about 4 weeks (around 07/21/2018) for HTN.

## 2018-06-29 DIAGNOSIS — F339 Major depressive disorder, recurrent, unspecified: Secondary | ICD-10-CM | POA: Insufficient documentation

## 2018-06-29 DIAGNOSIS — I10 Essential (primary) hypertension: Secondary | ICD-10-CM | POA: Insufficient documentation

## 2018-06-29 NOTE — Assessment & Plan Note (Signed)
Will add metoprolol to help with her palpitations. Continue lisinopril and home logs. Will recheck and adjust as needed. DASH diet, exercise, stress reduction reviewed

## 2018-06-29 NOTE — Assessment & Plan Note (Signed)
Self d/c'd abilify, continue off the medicine and f/u if moods worsening. Continue cymbalta

## 2018-06-30 ENCOUNTER — Ambulatory Visit: Payer: BC Managed Care – PPO | Admitting: Family Medicine

## 2018-07-06 ENCOUNTER — Other Ambulatory Visit: Payer: Self-pay | Admitting: Family Medicine

## 2018-07-10 ENCOUNTER — Other Ambulatory Visit: Payer: Self-pay | Admitting: Family Medicine

## 2018-07-10 ENCOUNTER — Encounter: Payer: Self-pay | Admitting: Family Medicine

## 2018-07-10 MED ORDER — HYDROCORTISONE 2.5 % RE CREA: 1.0000 "application " | TOPICAL_CREAM | Freq: Two times a day (BID) | RECTAL | 0 refills | Status: DC

## 2018-07-13 ENCOUNTER — Ambulatory Visit: Payer: Self-pay

## 2018-07-13 ENCOUNTER — Encounter: Payer: Self-pay | Admitting: Family Medicine

## 2018-07-13 NOTE — Telephone Encounter (Signed)
Can you please get her scheduled for a virtual visit tomorrow? Thanks!

## 2018-07-13 NOTE — Telephone Encounter (Signed)
Pt called to say she has noticed her BP elevating and her heart racing.  She states she has a headache and no other symptoms except the racing heart. Her BP today 133/94 with HR 103. Yesterday BP 143/100 HR 108.  She has not skipped and doses of her medication.  Per protocol Office will be in touch with patient to set appointment type per [place]. Care advice read to patient. Pt verbalized understanding of all instructions.  Reason for Disposition . [1] Taking BP medications AND [2] feels is having side effects (e.g., impotence, cough, dizzy upon standing)  Answer Assessment - Initial Assessment Questions 1. BLOOD PRESSURE: "What is the blood pressure?" "Did you take at least two measurements 5 minutes apart?"    135/94 Hr 103 2. ONSET: "When did you take your blood pressure?"    Sunday 3. HOW: "How did you obtain the blood pressure?" (e.g., visiting nurse, automatic home BP monitor)     Home BP 4. HISTORY: "Do you have a history of high blood pressure?"     yes 5. MEDICATIONS: "Are you taking any medications for blood pressure?" "Have you missed any doses recently?"     No missed doses 6. OTHER SYMPTOMS: "Do you have any symptoms?" (e.g., headache, chest pain, blurred vision, difficulty breathing, weakness)    Headache,no 7. PREGNANCY: "Is there any chance you are pregnant?" "When was your last menstrual period?"     No  Protocols used: HIGH BLOOD PRESSURE-A-AH

## 2018-07-13 NOTE — Telephone Encounter (Signed)
Done scheduled for 8AM

## 2018-07-14 ENCOUNTER — Other Ambulatory Visit: Payer: Self-pay

## 2018-07-14 ENCOUNTER — Ambulatory Visit (INDEPENDENT_AMBULATORY_CARE_PROVIDER_SITE_OTHER): Payer: BC Managed Care – PPO | Admitting: Family Medicine

## 2018-07-14 ENCOUNTER — Encounter: Payer: Self-pay | Admitting: Family Medicine

## 2018-07-14 VITALS — BP 125/86 | HR 97

## 2018-07-14 DIAGNOSIS — I10 Essential (primary) hypertension: Secondary | ICD-10-CM

## 2018-07-14 MED ORDER — METOPROLOL SUCCINATE ER 50 MG PO TB24: 50.0000 mg | ORAL_TABLET | Freq: Every day | ORAL | 1 refills | Status: DC

## 2018-07-14 MED ORDER — LISINOPRIL 5 MG PO TABS: ORAL_TABLET | ORAL | 1 refills | Status: DC

## 2018-07-14 NOTE — Assessment & Plan Note (Signed)
Running a bit high in the PMs and making her anxious. She will increase her metoprolol to 50mg  and continue to monitor. Will come in for BMP. Call with any concerns.

## 2018-07-14 NOTE — Progress Notes (Signed)
BP 125/86 Comment: pt reported- virtual visit  Pulse 97 Comment: pt reported- virtual visit   Subjective:    Patient ID: Morgan Cisneros, female    DOB: May 16, 1965, 53 y.o.   MRN: [id number]  HPI: Morgan Cisneros is a 53 y.o. female  Chief Complaint  Patient presents with  . Hypertension  . TELEMEDICINE VISIT   HYPERTENSION- does OK in the AM, but then BP goes up in the PM and makes her feel a little anxious Hypertension status: exacerbated  Satisfied with current treatment? no Duration of hypertension: chronic BP monitoring frequency:  a few times a day BP medication side effects:  no Medication compliance: excellent compliance Previous BP meds:lisinopril and metoprolol Aspirin: no Recurrent headaches: no Visual changes: no Palpitations: no Dyspnea: no Chest pain: no Lower extremity edema: no Dizzy/lightheaded: no    Relevant past medical, surgical, family and social history reviewed and updated as indicated. Interim medical history since our last visit reviewed. Allergies and medications reviewed and updated.  Review of Systems  Constitutional: Negative.   Respiratory: Negative.   Cardiovascular: Negative.   Musculoskeletal: Negative.   Skin: Negative.   Psychiatric/Behavioral: Negative for agitation, behavioral problems, confusion, decreased concentration, dysphoric mood, hallucinations, self-injury, sleep disturbance and suicidal ideas. The patient is nervous/anxious. The patient is not hyperactive.     Per HPI unless specifically indicated above     Objective:    BP 125/86 Comment: pt reported- virtual visit  Pulse 97 Comment: pt reported- virtual visit  Wt Readings from Last 3 Encounters:  06/09/18 148 lb 9.6 oz (67.4 kg)  06/01/18 153 lb 2 oz (69.5 kg)  05/23/18 148 lb 9.6 oz (67.4 kg)    Physical Exam Vitals signs and nursing note reviewed.  Constitutional:      General: She is not in acute distress.    Appearance: Normal appearance. She  is not ill-appearing, toxic-appearing or diaphoretic.  HENT:     Head: Normocephalic and atraumatic.     Right Ear: External ear normal.     Left Ear: External ear normal.     Nose: Nose normal.     Mouth/Throat:     Mouth: Mucous membranes are moist.     Pharynx: Oropharynx is clear.  Eyes:     General: No scleral icterus.       Right eye: No discharge.        Left eye: No discharge.     Conjunctiva/sclera: Conjunctivae normal.     Pupils: Pupils are equal, round, and reactive to light.  Neck:     Musculoskeletal: Normal range of motion.  Pulmonary:     Effort: Pulmonary effort is normal. No respiratory distress.     Comments: Speaking in full sentences Musculoskeletal: Normal range of motion.  Skin:    Coloration: Skin is not jaundiced or pale.     Findings: No bruising, erythema, lesion or rash.  Neurological:     Mental Status: She is alert and oriented to person, place, and time. Mental status is at baseline.  Psychiatric:        Mood and Affect: Mood normal.        Behavior: Behavior normal.        Thought Content: Thought content normal.        Judgment: Judgment normal.     Results for orders placed or performed in visit on 06/09/18  CBC with Differential/Platelet  Result Value Ref Range   WBC 7.3 3.4 - 10.8 x10E3/uL  RBC 4.62 3.77 - 5.28 x10E6/uL   Hemoglobin 15.4 11.1 - 15.9 g/dL   Hematocrit 45.2 34.0 - 46.6 %   MCV 98 (H) 79 - 97 fL   MCH 33.3 (H) 26.6 - 33.0 pg   MCHC 34.1 31.5 - 35.7 g/dL   RDW 13.1 11.7 - 15.4 %   Platelets 344 150 - 450 x10E3/uL   Neutrophils 63 Not Estab. %   Lymphs 29 Not Estab. %   Monocytes 5 Not Estab. %   Eos 2 Not Estab. %   Basos 1 Not Estab. %   Neutrophils Absolute 4.7 1.4 - 7.0 x10E3/uL   Lymphocytes Absolute 2.1 0.7 - 3.1 x10E3/uL   Monocytes Absolute 0.4 0.1 - 0.9 x10E3/uL   EOS (ABSOLUTE) 0.1 0.0 - 0.4 x10E3/uL   Basophils Absolute 0.0 0.0 - 0.2 x10E3/uL   Immature Granulocytes 0 Not Estab. %   Immature Grans  (Abs) 0.0 0.0 - 0.1 x10E3/uL  Comprehensive metabolic panel  Result Value Ref Range   Glucose 87 65 - 99 mg/dL   BUN 13 6 - 24 mg/dL   Creatinine, Ser 0.65 0.57 - 1.00 mg/dL   GFR calc non Af Amer 103 >59 mL/min/1.73   GFR calc Af Amer 119 >59 mL/min/1.73   BUN/Creatinine Ratio 20 9 - 23   Sodium 144 134 - 144 mmol/L   Potassium 4.5 3.5 - 5.2 mmol/L   Chloride 102 96 - 106 mmol/L   CO2 23 20 - 29 mmol/L   Calcium 10.0 8.7 - 10.2 mg/dL   Total Protein 7.2 6.0 - 8.5 g/dL   Albumin 4.9 3.8 - 4.9 g/dL   Globulin, Total 2.3 1.5 - 4.5 g/dL   Albumin/Globulin Ratio 2.1 1.2 - 2.2   Bilirubin Total 0.3 0.0 - 1.2 mg/dL   Alkaline Phosphatase 153 (H) 39 - 117 IU/L   AST 25 0 - 40 IU/L   ALT 38 (H) 0 - 32 IU/L  TSH  Result Value Ref Range   TSH 1.640 0.450 - 4.500 uIU/mL      Assessment & Plan:   Problem List Items Addressed This Visit      Cardiovascular and Mediastinum   Essential hypertension - Primary    Running a bit high in the PMs and making her anxious. She will increase her metoprolol to 50mg  and continue to monitor. Will come in for BMP. Call with any concerns.       Relevant Medications   metoprolol succinate (TOPROL-XL) 50 MG 24 hr tablet   lisinopril (PRINIVIL,ZESTRIL) 5 MG tablet   Other Relevant Orders   Basic metabolic panel       Follow up plan: Return in about 4 months (around 11/13/2018) for Follow up.    . This visit was completed via FaceTime due to the restrictions of the COVID-19 pandemic. All issues as above were discussed and addressed. Physical exam was done as above through visual confirmation on FaceTime. If it was felt that the patient should be evaluated in the office, they were directed there. The patient verbally consented to this visit. . Location of the patient: home . Location of the provider: home . Those involved with this call:  . Provider: Park Liter, DO . CMA: Yvonna Alanis, [place] . Front Desk/Registration: Don Perking  .  Time spent on call: 15 minutes with patient face to face via video conference. More than 50% of this time was spent in counseling and coordination of care. 23 minutes total spent in review of  patient's record and preparation of their chart.

## 2018-07-17 ENCOUNTER — Encounter: Payer: Self-pay | Admitting: Family Medicine

## 2018-07-20 ENCOUNTER — Other Ambulatory Visit: Payer: Self-pay | Admitting: Family Medicine

## 2018-07-21 ENCOUNTER — Other Ambulatory Visit: Payer: BC Managed Care – PPO

## 2018-07-21 ENCOUNTER — Other Ambulatory Visit: Payer: Self-pay

## 2018-07-21 DIAGNOSIS — I10 Essential (primary) hypertension: Secondary | ICD-10-CM

## 2018-07-22 LAB — BASIC METABOLIC PANEL
BUN/Creatinine Ratio: 19 (ref 9–23)
BUN: 14 mg/dL (ref 6–24)
CO2: 22 mmol/L (ref 20–29)
Calcium: 9.5 mg/dL (ref 8.7–10.2)
Chloride: 100 mmol/L (ref 96–106)
Creatinine, Ser: 0.74 mg/dL (ref 0.57–1.00)
GFR calc Af Amer: 108 mL/min/{1.73_m2} (ref >59)
GFR calc non Af Amer: 94 mL/min/{1.73_m2} (ref >59)
Glucose: 95 mg/dL (ref 65–99)
Potassium: 4.5 mmol/L (ref 3.5–5.2)
Sodium: 140 mmol/L (ref 134–144)

## 2018-07-28 ENCOUNTER — Ambulatory Visit: Payer: BC Managed Care – PPO | Admitting: Family Medicine

## 2018-07-28 ENCOUNTER — Other Ambulatory Visit: Payer: BC Managed Care – PPO

## 2018-07-31 ENCOUNTER — Ambulatory Visit: Payer: BC Managed Care – PPO

## 2018-08-02 ENCOUNTER — Telehealth: Payer: BC Managed Care – PPO | Admitting: Family

## 2018-08-02 DIAGNOSIS — J069 Acute upper respiratory infection, unspecified: Secondary | ICD-10-CM

## 2018-08-02 DIAGNOSIS — B9789 Other viral agents as the cause of diseases classified elsewhere: Secondary | ICD-10-CM

## 2018-08-02 MED ORDER — FLUTICASONE PROPIONATE 50 MCG/ACT NA SUSP: 2.0000 | Freq: Every day | NASAL | 6 refills | Status: DC

## 2018-08-02 MED ORDER — BENZONATATE 100 MG PO CAPS: 100.0000 mg | ORAL_CAPSULE | Freq: Three times a day (TID) | ORAL | 0 refills | Status: DC | PRN

## 2018-08-02 NOTE — Progress Notes (Signed)
We are sorry you are not feeling well.  Here is how we plan to help!  Based on what you have shared with me, it looks like you may have a viral upper respiratory infection.  Upper respiratory infections are caused by a large number of viruses; however, rhinovirus is the most common cause.   Symptoms vary from person to person, with common symptoms including sore throat, cough, and fatigue or lack of energy.  A low-grade fever of up to 100.4 may present, but is often uncommon.  Symptoms vary however, and are closely related to a person's age or underlying illnesses.  The most common symptoms associated with an upper respiratory infection are nasal discharge or congestion, cough, sneezing, headache and pressure in the ears and face.  These symptoms usually persist for about 3 to 10 days, but can last up to 2 weeks.  It is important to know that upper respiratory infections do not cause serious illness or complications in most cases.    Approximately 5 minutes was spent documenting and reviewing patient's chart.    Upper respiratory infections can be transmitted from person to person, with the most common method of transmission being a person's hands.  The virus is able to live on the skin and can infect other persons for up to 2 hours after direct contact.  Also, these can be transmitted when someone coughs or sneezes; thus, it is important to cover the mouth to reduce this risk.  To keep the spread of the illness at [place], good hand hygiene is very important.  This is an infection that is most likely caused by a virus. There are no specific treatments other than to help you with the symptoms until the infection runs its course.  We are sorry you are not feeling well.  Here is how we plan to help!   For nasal congestion, you may use an oral decongestants such as Mucinex D or if you have glaucoma or high blood pressure use plain Mucinex.  Saline nasal spray or nasal drops can help and can safely be used as  often as needed for congestion.  For your congestion, I have prescribed Fluticasone nasal spray one spray in each nostril twice a day  If you do not have a history of heart disease, hypertension, diabetes or thyroid disease, prostate/bladder issues or glaucoma, you may also use Sudafed to treat nasal congestion.  It is highly recommended that you consult with a pharmacist or your primary care physician to ensure this medication is safe for you to take.     If you have a cough, you may use cough suppressants such as Delsym and Robitussin.  If you have glaucoma or high blood pressure, you can also use Coricidin HBP.   For cough I have prescribed for you A prescription cough medication called Tessalon Perles 100 mg. You may take 1-2 capsules every 8 hours as needed for cough  If you have a sore or scratchy throat, use a saltwater gargle-  to  teaspoon of salt dissolved in a 4-ounce to 8-ounce glass of warm water.  Gargle the solution for approximately 15-30 seconds and then spit.  It is important not to swallow the solution.  You can also use throat lozenges/cough drops and Chloraseptic spray to help with throat pain or discomfort.  Warm or cold liquids can also be helpful in relieving throat pain.  For headache, pain or general discomfort, you can use Ibuprofen or Tylenol as directed.   Some  authorities believe that zinc sprays or the use of Echinacea may shorten the course of your symptoms.   HOME CARE . Only take medications as instructed by your medical team. . Be sure to drink plenty of fluids. Water is fine as well as fruit juices, sodas and electrolyte beverages. You may want to stay away from caffeine or alcohol. If you are nauseated, try taking small sips of liquids. How do you know if you are getting enough fluid? Your urine should be a pale yellow or almost colorless. . Get rest. . Taking a steamy shower or using a humidifier may help nasal congestion and ease sore throat pain. You can  place a towel over your head and breathe in the steam from hot water coming from a faucet. . Using a saline nasal spray works much the same way. . Cough drops, hard candies and sore throat lozenges may ease your cough. . Avoid close contacts especially the very young and the elderly . Cover your mouth if you cough or sneeze . Always remember to wash your hands.   GET HELP RIGHT AWAY IF: . You develop worsening fever. . If your symptoms do not improve within 10 days . You develop yellow or green discharge from your nose over 3 days. . You have coughing fits . You develop a severe head ache or visual changes. . You develop shortness of breath, difficulty breathing or start having chest pain . Your symptoms persist after you have completed your treatment plan  MAKE SURE YOU   Understand these instructions.  Will watch your condition.  Will get help right away if you are not doing well or get worse.  Your e-visit answers were reviewed by a board certified advanced clinical practitioner to complete your personal care plan. Depending upon the condition, your plan could have included both over the counter or prescription medications. Please review your pharmacy choice. If there is a problem, you may call our nursing hot line at and have the prescription routed to another pharmacy. Your safety is important to [place]. If you have drug allergies check your prescription carefully.   You can use MyChart to ask questions about today's visit, request a non-urgent call back, or ask for a work or school excuse for 24 hours related to this e-Visit. If it has been greater than 24 hours you will need to follow up with your provider, or enter a new e-Visit to address those concerns. You will get an e-mail in the next two days asking about your experience.  I hope that your e-visit has been valuable and will speed your recovery. Thank you for using e-visits.

## 2018-08-09 ENCOUNTER — Encounter: Payer: Self-pay | Admitting: Family Medicine

## 2018-08-14 ENCOUNTER — Other Ambulatory Visit: Payer: Self-pay

## 2018-08-14 ENCOUNTER — Encounter: Payer: Self-pay | Admitting: Family Medicine

## 2018-08-14 ENCOUNTER — Telehealth (INDEPENDENT_AMBULATORY_CARE_PROVIDER_SITE_OTHER): Payer: BC Managed Care – PPO | Admitting: Family Medicine

## 2018-08-14 VITALS — BP 124/81 | HR 94 | Wt 147.8 lb

## 2018-08-14 DIAGNOSIS — J0110 Acute frontal sinusitis, unspecified: Secondary | ICD-10-CM | POA: Diagnosis not present

## 2018-08-14 MED ORDER — DOXYCYCLINE HYCLATE 100 MG PO TABS: 100.0000 mg | ORAL_TABLET | Freq: Two times a day (BID) | ORAL | 0 refills | Status: DC

## 2018-08-14 NOTE — Progress Notes (Signed)
BP 124/81   Pulse 94   Wt 147 lb 12.8 oz (67 kg)   BMI 23.86 kg/m    Subjective:    Patient ID: Morgan Cisneros, female    DOB: 02-Feb-1966, 53 y.o.   MRN: [id number]  HPI: [name] is a 53 y.o. female  Chief Complaint  Patient presents with  . Follow-up    No worse, just no better. chest congestion, sore throat, wet cough and drainage. x 2.5 weeks.    UPPER RESPIRATORY TRACT INFECTION Worst symptom: congestion Fever: no Cough: yes Shortness of breath: no Wheezing: no Chest pain: no Chest tightness: no Chest congestion: no Nasal congestion: yes Runny nose: yes Post nasal drip: yes Sneezing: no Sore throat: yes Swollen glands: no Sinus pressure: yes Headache: yes Face pain: yes Toothache: yes Ear pain: no  Ear pressure: yes left Eyes red/itching:no Eye drainage/crusting: no  Vomiting: no Rash: no Fatigue: yes Sick contacts: no Strep contacts: no  Context: worse Recurrent sinusitis: no Relief with OTC cold/cough medications: no  Treatments attempted: cold/sinus, mucinex, anti-histamine and pseudoephedrine   Relevant past medical, surgical, family and social history reviewed and updated as indicated. Interim medical history since our last visit reviewed. Allergies and medications reviewed and updated.  Review of Systems  Constitutional: Positive for chills, diaphoresis and fatigue. Negative for activity change, appetite change, fever and unexpected weight change.  HENT: Positive for congestion, postnasal drip, rhinorrhea, sinus pressure and sinus pain. Negative for dental problem, drooling, ear discharge, ear pain, facial swelling, hearing loss, mouth sores, nosebleeds, sneezing, sore throat, tinnitus, trouble swallowing and voice change.   Eyes: Negative.   Respiratory: Negative.   Cardiovascular: Negative.   Musculoskeletal: Negative.   Neurological: Negative.   Psychiatric/Behavioral: Negative.     Per HPI unless specifically indicated  above     Objective:    BP 124/81   Pulse 94   Wt 147 lb 12.8 oz (67 kg)   BMI 23.86 kg/m   Wt Readings from Last 3 Encounters:  08/14/18 147 lb 12.8 oz (67 kg)  06/09/18 148 lb 9.6 oz (67.4 kg)  06/01/18 153 lb 2 oz (69.5 kg)    Physical Exam Vitals signs and nursing note reviewed.  Constitutional:      General: She is not in acute distress.    Appearance: Normal appearance. She is not ill-appearing, toxic-appearing or diaphoretic.  HENT:     Head: Normocephalic and atraumatic.     Comments: Tenderness over R frontal sinus    Right Ear: External ear normal.     Left Ear: External ear normal.     Nose: Congestion and rhinorrhea present.     Mouth/Throat:     Mouth: Mucous membranes are moist.     Pharynx: Oropharynx is clear. No oropharyngeal exudate or posterior oropharyngeal erythema.  Eyes:     General: No scleral icterus.       Right eye: No discharge.        Left eye: No discharge.     Conjunctiva/sclera: Conjunctivae normal.     Pupils: Pupils are equal, round, and reactive to light.  Neck:     Musculoskeletal: Normal range of motion.  Pulmonary:     Effort: Pulmonary effort is normal. No respiratory distress.     Comments: Speaking in full sentences Musculoskeletal: Normal range of motion.  Skin:    Coloration: Skin is not jaundiced or pale.     Findings: No bruising, erythema, lesion or rash.  Neurological:  Mental Status: She is alert and oriented to person, place, and time. Mental status is at baseline.  Psychiatric:        Mood and Affect: Mood normal.        Behavior: Behavior normal.        Thought Content: Thought content normal.        Judgment: Judgment normal.     Results for orders placed or performed in visit on 35/57/32  Basic metabolic panel  Result Value Ref Range   Glucose 95 65 - 99 mg/dL   BUN 14 6 - 24 mg/dL   Creatinine, Ser 0.74 0.57 - 1.00 mg/dL   GFR calc non Af Amer 94 >59 mL/min/1.73   GFR calc Af Amer 108 >59 mL/min/1.73    BUN/Creatinine Ratio 19 9 - 23   Sodium 140 134 - 144 mmol/L   Potassium 4.5 3.5 - 5.2 mmol/L   Chloride 100 96 - 106 mmol/L   CO2 22 20 - 29 mmol/L   Calcium 9.5 8.7 - 10.2 mg/dL      Assessment & Plan:   Problem List Items Addressed This Visit    None    Visit Diagnoses    Acute non-recurrent frontal sinusitis    -  Primary   Will treat with doxycycline. Call if not getting better or getting worse.    Relevant Medications   doxycycline (VIBRA-TABS) 100 MG tablet       Follow up plan: Return if symptoms worsen or fail to improve.    . This visit was completed via MyChart due to the restrictions of the COVID-19 pandemic. All issues as above were discussed and addressed. Physical exam was done as above through visual confirmation on MyChart. If it was felt that the patient should be evaluated in the office, they were directed there. The patient verbally consented to this visit. . Location of the patient: home . Location of the provider: home . Those involved with this call:  . Provider: Park Liter, DO . CMA: Gerda Diss, CMA . Front Desk/Registration: Don Perking  . Time spent on call: 15 minutes with patient face to face via video conference. More than 50% of this time was spent in counseling and coordination of care. 23 minutes total spent in review of patient's record and preparation of their chart.

## 2018-10-05 ENCOUNTER — Other Ambulatory Visit: Payer: Self-pay

## 2018-10-05 ENCOUNTER — Ambulatory Visit (INDEPENDENT_AMBULATORY_CARE_PROVIDER_SITE_OTHER): Payer: BC Managed Care – PPO | Admitting: Family Medicine

## 2018-10-05 ENCOUNTER — Encounter: Payer: Self-pay | Admitting: Family Medicine

## 2018-10-05 VITALS — BP 118/83 | HR 87 | Ht 65.0 in | Wt 143.0 lb

## 2018-10-05 DIAGNOSIS — R197 Diarrhea, unspecified: Secondary | ICD-10-CM | POA: Diagnosis not present

## 2018-10-05 MED ORDER — CIPROFLOXACIN HCL 500 MG PO TABS: 500.0000 mg | ORAL_TABLET | Freq: Two times a day (BID) | ORAL | 0 refills | Status: DC

## 2018-10-05 MED ORDER — METRONIDAZOLE 500 MG PO TABS: 500.0000 mg | ORAL_TABLET | Freq: Two times a day (BID) | ORAL | 0 refills | Status: DC

## 2018-10-05 NOTE — Progress Notes (Signed)
BP 118/83   Pulse 87   Ht 5\' 5"  (1.651 m)   Wt 143 lb (64.9 kg)   BMI 23.80 kg/m    Subjective:    Patient ID: Morgan Cisneros, female    DOB: [birth year], 53 y.o.   MRN: [id number]  HPI: Morgan Cisneros is a 53 y.o. female  Chief Complaint  Patient presents with  . Abdominal Pain    belly area. ongoing for a few months but lately symptoms have gotten worse. tried OTC imodium   . Diarrhea   ABDOMINAL PAIN  Duration:about a month- was having pain about 1x a week, had severe diarrhea, started yesterday with abdominal pain, went 30x yesterday- has only gone 1x today Onset: sudden Severity: severe Quality: cramping Location:  diffuse  Episode duration:  Radiation: no Frequency: intermittent Alleviating factors:  Aggravating factors: Status: better Treatments attempted: imodium, dramamine Fever: yes Nausea: yes Vomiting: no Weight loss: no Decreased appetite: no Diarrhea: yes Constipation: no Blood in stool: no Heartburn: no Jaundice: no Rash: no Dysuria/urinary frequency: no Hematuria: no History of sexually transmitted disease: no Recurrent NSAID use: no  Relevant past medical, surgical, family and social history reviewed and updated as indicated. Interim medical history since our last visit reviewed. Allergies and medications reviewed and updated.  Review of Systems  Constitutional: Positive for fever. Negative for activity change, appetite change, chills, diaphoresis, fatigue and unexpected weight change.  HENT: Negative.   Respiratory: Negative.   Cardiovascular: Negative.   Gastrointestinal: Positive for abdominal pain, diarrhea and nausea. Negative for abdominal distention, anal bleeding, blood in stool, constipation, rectal pain and vomiting.  Genitourinary: Negative.   Musculoskeletal: Negative.   Skin: Negative.   Psychiatric/Behavioral: Negative.     Per HPI unless specifically indicated above     Objective:    BP 118/83   Pulse 87    Ht 5\' 5"  (1.651 m)   Wt 143 lb (64.9 kg)   BMI 23.80 kg/m   Wt Readings from Last 3 Encounters:  10/05/18 143 lb (64.9 kg)  08/14/18 147 lb 12.8 oz (67 kg)  06/09/18 148 lb 9.6 oz (67.4 kg)    Physical Exam Vitals signs and nursing note reviewed.  Constitutional:      General: She is not in acute distress.    Appearance: Normal appearance. She is not ill-appearing, toxic-appearing or diaphoretic.  HENT:     Head: Normocephalic and atraumatic.     Right Ear: External ear normal.     Left Ear: External ear normal.     Nose: Nose normal.     Mouth/Throat:     Mouth: Mucous membranes are moist.     Pharynx: Oropharynx is clear.  Eyes:     General: No scleral icterus.       Right eye: No discharge.        Left eye: No discharge.     Conjunctiva/sclera: Conjunctivae normal.     Pupils: Pupils are equal, round, and reactive to light.  Neck:     Musculoskeletal: Normal range of motion.  Pulmonary:     Effort: Pulmonary effort is normal. No respiratory distress.     Comments: Speaking in full sentences Abdominal:     General: Abdomen is flat.     Palpations: Abdomen is soft. There is no shifting dullness, fluid wave, hepatomegaly, splenomegaly, mass or pulsatile mass.     Tenderness: There is no abdominal tenderness (on self exam- visually examined).  Musculoskeletal: Normal range of motion.  Skin:    Coloration: Skin is not jaundiced or pale.     Findings: No bruising, erythema, lesion or rash.  Neurological:     Mental Status: She is alert and oriented to person, place, and time. Mental status is at baseline.  Psychiatric:        Mood and Affect: Mood normal.        Behavior: Behavior normal.        Thought Content: Thought content normal.        Judgment: Judgment normal.     Results for orders placed or performed in visit on 45/80/99  Basic metabolic panel  Result Value Ref Range   Glucose 95 65 - 99 mg/dL   BUN 14 6 - 24 mg/dL   Creatinine, Ser 0.74 0.57 - 1.00  mg/dL   GFR calc non Af Amer 94 >59 mL/min/1.73   GFR calc Af Amer 108 >59 mL/min/1.73   BUN/Creatinine Ratio 19 9 - 23   Sodium 140 134 - 144 mmol/L   Potassium 4.5 3.5 - 5.2 mmol/L   Chloride 100 96 - 106 mmol/L   CO2 22 20 - 29 mmol/L   Calcium 9.5 8.7 - 10.2 mg/dL      Assessment & Plan:   Problem List Items Addressed This Visit    None    Visit Diagnoses    Diarrhea, unspecified type    -  Primary   Will check stool studies and treat for diverticulitis after stool collected. Await results. Call with any concerns. Continue to monitor.    Relevant Orders   Ova and parasite examination   Stool C-Diff Toxin Assay   Stool Culture   Fecal leukocytes       Follow up plan: Return if symptoms worsen or fail to improve.    . This visit was completed via FaceTime due to the restrictions of the COVID-19 pandemic. All issues as above were discussed and addressed. Physical exam was done as above through visual confirmation on FaceTime. If it was felt that the patient should be evaluated in the office, they were directed there. The patient verbally consented to this visit. . Location of the patient: home . Location of the provider: work . Those involved with this call:  . Provider: Park Liter, DO . CMA: Lesle Chris, [place] . Front Desk/Registration: Don Perking  . Time spent on call: 15 minutes with patient face to face via video conference. More than 50% of this time was spent in counseling and coordination of care. 23 minutes total spent in review of patient's record and preparation of their chart.

## 2018-10-05 NOTE — Patient Instructions (Signed)
Food Choices to Help Relieve Diarrhea, Adult When you have diarrhea, the foods you eat and your eating habits are very important. Choosing the right foods and drinks can help:  Relieve diarrhea.  Replace lost fluids and nutrients.  Prevent dehydration. What general guidelines should I follow?  Relieving diarrhea  Choose foods with less than 2 g or .07 oz. of fiber per serving.  Limit fats to less than 8 tsp (38 g or 1.34 oz.) a day.  Avoid the following: ? Foods and beverages sweetened with high-fructose corn syrup, honey, or sugar alcohols such as xylitol, sorbitol, and mannitol. ? Foods that contain a lot of fat or sugar. ? Fried, greasy, or spicy foods. ? High-fiber grains, breads, and cereals. ? Raw fruits and vegetables.  Eat foods that are rich in probiotics. These foods include dairy products such as yogurt and fermented milk products. They help increase healthy bacteria in the stomach and intestines (gastrointestinal tract, or GI tract).  If you have lactose intolerance, avoid dairy products. These may make your diarrhea worse.  Take medicine to help stop diarrhea (antidiarrheal medicine) only as told by your health care provider. Replacing nutrients  Eat small meals or snacks every 3-4 hours.  Eat bland foods, such as white rice, toast, or baked potato, until your diarrhea starts to get better. Gradually reintroduce nutrient-rich foods as tolerated or as told by your health care provider. This includes: ? Well-cooked protein foods. ? Peeled, seeded, and soft-cooked fruits and vegetables. ? Low-fat dairy products.  Take vitamin and mineral supplements as told by your health care provider. Preventing dehydration  Start by sipping water or a special solution to prevent dehydration (oral rehydration solution, ORS). Urine that is clear or pale yellow means that you are getting enough fluid.  Try to drink at least 8-10 cups of fluid each day to help replace lost  fluids.  You may add other liquids in addition to water, such as clear juice or decaffeinated sports drinks, as tolerated or as told by your health care provider.  Avoid drinks with caffeine, such as coffee, tea, or soft drinks.  Avoid alcohol. What foods are recommended?     The items listed may not be a complete list. Talk with your health care provider about what dietary choices are best for you. Grains White rice. White, French, or pita breads (fresh or toasted), including plain rolls, buns, or bagels. White pasta. Saltine, soda, or graham crackers. Pretzels. Low-fiber cereal. Cooked cereals made with water (such as cornmeal, farina, or cream cereals). Plain muffins. Matzo. Melba toast. Zwieback. Vegetables Potatoes (without the skin). Most well-cooked and canned vegetables without skins or seeds. Tender lettuce. Fruits Apple sauce. Fruits canned in juice. Cooked apricots, cherries, grapefruit, peaches, pears, or plums. Fresh bananas and cantaloupe. Meats and other protein foods Baked or boiled chicken. Eggs. Tofu. Fish. Seafood. Smooth nut butters. Ground or well-cooked tender beef, ham, veal, lamb, pork, or poultry. Dairy Plain yogurt, kefir, and unsweetened liquid yogurt. Lactose-free milk, buttermilk, skim milk, or soy milk. Low-fat or nonfat hard cheese. Beverages Water. Low-calorie sports drinks. Fruit juices without pulp. Strained tomato and vegetable juices. Decaffeinated teas. Sugar-free beverages not sweetened with sugar alcohols. Oral rehydration solutions, if approved by your health care provider. Seasoning and other foods Bouillon, broth, or soups made from recommended foods. What foods are not recommended? The items listed may not be a complete list. Talk with your health care provider about what dietary choices are best for you. Grains Whole   grain, whole wheat, bran, or rye breads, rolls, pastas, and crackers. Wild or brown rice. Whole grain or bran cereals. Barley.  Oats and oatmeal. Corn tortillas or taco shells. Granola. Popcorn. Vegetables Raw vegetables. Fried vegetables. Cabbage, broccoli, Brussels sprouts, artichokes, baked beans, beet greens, corn, kale, legumes, peas, sweet potatoes, and yams. Potato skins. Cooked spinach and cabbage. Fruits Dried fruit, including raisins and dates. Raw fruits. Stewed or dried prunes. Canned fruits with syrup. Meat and other protein foods Fried or fatty meats. Deli meats. Chunky nut butters. Nuts and seeds. Beans and lentils. Bacon. Hot dogs. Sausage. Dairy High-fat cheeses. Whole milk, chocolate milk, and beverages made with milk, such as milk shakes. Half-and-half. Cream. sour cream. Ice cream. Beverages Caffeinated beverages (such as coffee, tea, soda, or energy drinks). Alcoholic beverages. Fruit juices with pulp. Prune juice. Soft drinks sweetened with high-fructose corn syrup or sugar alcohols. High-calorie sports drinks. Fats and oils Butter. Cream sauces. Margarine. Salad oils. Plain salad dressings. Olives. Avocados. Mayonnaise. Sweets and desserts Sweet rolls, doughnuts, and sweet breads. Sugar-free desserts sweetened with sugar alcohols such as xylitol and sorbitol. Seasoning and other foods Honey. Hot sauce. Chili powder. Gravy. Cream-based or milk-based soups. Pancakes and waffles. Summary  When you have diarrhea, the foods you eat and your eating habits are very important.  Make sure you get at least 8-10 cups of fluid each day, or enough to keep your urine clear or pale yellow.  Eat bland foods and gradually reintroduce healthy, nutrient-rich foods as tolerated, or as told by your health care provider.  Avoid high-fiber, fried, greasy, or spicy foods. This information is not intended to replace advice given to you by your health care provider. Make sure you discuss any questions you have with your health care provider. Document Released: 06/12/2003 Document Revised: 07/13/2018 Document Reviewed:  03/19/2016 Elsevier Patient Education  2020 [employer]  

## 2018-10-09 ENCOUNTER — Other Ambulatory Visit: Payer: Self-pay

## 2018-10-09 ENCOUNTER — Other Ambulatory Visit: Payer: BC Managed Care – PPO

## 2018-10-09 DIAGNOSIS — R197 Diarrhea, unspecified: Secondary | ICD-10-CM

## 2018-10-11 ENCOUNTER — Encounter: Payer: Self-pay | Admitting: Family Medicine

## 2018-10-11 LAB — CLOSTRIDIUM DIFFICILE EIA: C difficile Toxins A+B, EIA: NEGATIVE

## 2018-10-12 ENCOUNTER — Telehealth: Payer: Self-pay | Admitting: Family Medicine

## 2018-10-12 NOTE — Telephone Encounter (Signed)
Message relayed to patient. Verbalized understanding and denied questions.   

## 2018-10-12 NOTE — Telephone Encounter (Signed)
Results are not back yet. No c. Diff- but the others aren't back.

## 2018-10-12 NOTE — Telephone Encounter (Signed)
Copied from [id number] [id number] [id number]. Topic: General - Other >> Oct 12, 2018  9:58 AM Leward Quan A wrote: Reason for CRM: Patient called to check on the status of stool sample that she brought in on 10/09/2018 asking for a call back with results ASAP please Ph# [phone]

## 2018-10-14 LAB — STOOL CULTURE: E coli, Shiga toxin Assay: NEGATIVE

## 2018-10-14 LAB — OVA AND PARASITE EXAMINATION

## 2018-10-14 LAB — FECAL LEUKOCYTES

## 2018-10-16 ENCOUNTER — Telehealth: Payer: Self-pay | Admitting: Family Medicine

## 2018-10-16 ENCOUNTER — Ambulatory Visit
Admission: RE | Admit: 2018-10-16 | Discharge: 2018-10-16 | Disposition: A | Discharge status: Home / Self Care | Payer: BC Managed Care – PPO | Source: Ambulatory Visit | Attending: Family Medicine | Admitting: Family Medicine

## 2018-10-16 ENCOUNTER — Encounter: Payer: Self-pay | Admitting: Family Medicine

## 2018-10-16 ENCOUNTER — Ambulatory Visit (INDEPENDENT_AMBULATORY_CARE_PROVIDER_SITE_OTHER): Payer: BC Managed Care – PPO | Admitting: Family Medicine

## 2018-10-16 ENCOUNTER — Other Ambulatory Visit: Payer: Self-pay

## 2018-10-16 VITALS — BP 112/74 | HR 72 | Ht 65.0 in | Wt 145.0 lb

## 2018-10-16 DIAGNOSIS — R197 Diarrhea, unspecified: Secondary | ICD-10-CM

## 2018-10-16 DIAGNOSIS — R1031 Right lower quadrant pain: Secondary | ICD-10-CM

## 2018-10-16 DIAGNOSIS — K5792 Diverticulitis of intestine, part unspecified, without perforation or abscess without bleeding: Secondary | ICD-10-CM | POA: Insufficient documentation

## 2018-10-16 HISTORY — DX: Essential (primary) hypertension: I10

## 2018-10-16 LAB — POCT I-STAT CREATININE: Creatinine, Ser: 0.8 mg/dL (ref 0.44–1.00)

## 2018-10-16 MED ORDER — IOHEXOL 300 MG/ML  SOLN
100.0000 mL | Freq: Once | INTRAMUSCULAR | Status: AC | PRN
  Administered 2018-10-16: 18:00:00 100 mL via INTRAVENOUS

## 2018-10-16 MED ORDER — TAMSULOSIN HCL 0.4 MG PO CAPS: 0.4000 mg | ORAL_CAPSULE | Freq: Every day | ORAL | 0 refills | Status: DC

## 2018-10-16 NOTE — Telephone Encounter (Signed)
Pt scheduled  

## 2018-10-16 NOTE — Telephone Encounter (Signed)
Discussed results with Jimmy. Diarrhea likely due to antibiotics- will stop. Pain possibly due to kidney stone. Will stop abx and start flomax. If not better in 1-2 days, will get her into see GI.

## 2018-10-16 NOTE — Progress Notes (Signed)
BP 112/74   Pulse 72   Ht 5\' 5"  (1.651 m)   Wt 145 lb (65.8 kg)   BMI 24.13 kg/m    Subjective:    Patient ID: Morgan Cisneros, female    DOB: [birth year], 53 y.o.   MRN: [id number]  HPI: Morgan Cisneros is a 53 y.o. female  Chief Complaint  Patient presents with  . Abdominal Pain    f/u test results  . Diarrhea   ABDOMINAL ISSUES- still having a lot of diarrhea and pain in the R side. Has not gotten any better.  Duration: 2 weeks Nature: "pain" constant Location: RLQ "R kidney" Severity: severe  Radiation: yes- into the pelvis Episode duration: constant Frequency: constant Alleviating factors: nothing Aggravating factors: nothing Treatments attempted: antibiotics, tylenol, imodium, gas-x Constipation: no Diarrhea: yes Episodes of diarrhea/day: 20+ without imodium,  Mucous in the stool: no Heartburn: yes Bloating:yes Flatulence: yes Nausea: yes Vomiting: no Melena or hematochezia: no Rash: no Jaundice: no Fever: no Weight loss: no  Relevant past medical, surgical, family and social history reviewed and updated as indicated. Interim medical history since our last visit reviewed. Allergies and medications reviewed and updated.  Review of Systems  Constitutional: Positive for chills, diaphoresis and fatigue. Negative for activity change, appetite change, fever and unexpected weight change.  HENT: Negative.   Respiratory: Negative.   Cardiovascular: Negative.   Gastrointestinal: Positive for abdominal pain, diarrhea and nausea. Negative for abdominal distention, anal bleeding, blood in stool, constipation, rectal pain and vomiting.  Genitourinary: Negative.   Musculoskeletal: Negative.   Skin: Negative.   Psychiatric/Behavioral: Negative.     Per HPI unless specifically indicated above     Objective:    BP 112/74   Pulse 72   Ht 5\' 5"  (1.651 m)   Wt 145 lb (65.8 kg)   BMI 24.13 kg/m   Wt Readings from Last 3 Encounters:  10/16/18 145 lb  (65.8 kg)  10/05/18 143 lb (64.9 kg)  08/14/18 147 lb 12.8 oz (67 kg)    Physical Exam Vitals signs and nursing note reviewed.  Constitutional:      General: She is not in acute distress.    Appearance: Normal appearance. She is not ill-appearing, toxic-appearing or diaphoretic.  HENT:     Head: Normocephalic and atraumatic.     Right Ear: External ear normal.     Left Ear: External ear normal.     Nose: Nose normal.     Mouth/Throat:     Mouth: Mucous membranes are moist.     Pharynx: Oropharynx is clear.  Eyes:     General: No scleral icterus.       Right eye: No discharge.        Left eye: No discharge.     Conjunctiva/sclera: Conjunctivae normal.     Pupils: Pupils are equal, round, and reactive to light.  Neck:     Musculoskeletal: Normal range of motion.  Pulmonary:     Effort: Pulmonary effort is normal. No respiratory distress.     Comments: Speaking in full sentences Abdominal:     Tenderness: There is abdominal tenderness (on self-palpation, confirmed visually on video) in the right lower quadrant.  Musculoskeletal: Normal range of motion.  Skin:    Coloration: Skin is not jaundiced or pale.     Findings: No bruising, erythema, lesion or rash.  Neurological:     Mental Status: She is alert and oriented to person, place, and time. Mental status is at  baseline.  Psychiatric:        Mood and Affect: Mood normal.        Behavior: Behavior normal.        Thought Content: Thought content normal.        Judgment: Judgment normal.     Results for orders placed or performed in visit on 10/09/18  Fecal leukocytes   Specimen: Stool   ST     CD- [id number] V  Result Value Ref Range   White Blood Cells (WBC), Stool Final report None Seen   Result 1 Comment   Stool Culture   Specimen: Stool   ST     CD- [id number] V  Result Value Ref Range   Salmonella/Shigella Screen Final report    Stool Culture result 1 ([facility]) Comment    Campylobacter Culture Final report     Stool Culture result 1 ([facility]) Comment    E coli, Shiga toxin Assay Negative Negative  Stool C-Diff Toxin Assay   Specimen: Stool   ST  Result Value Ref Range   C difficile Toxins A+B, EIA Negative Negative  Ova and parasite examination   Specimen: Stool   ST  Result Value Ref Range   OVA + PARASITE EXAM Final report    O&P result 1 Comment       Assessment & Plan:   Problem List Items Addressed This Visit    None    Visit Diagnoses    Diarrhea, unspecified type    -  Primary   Worsening. Stool studies negative. No better on abx. Will set up for CT abdomen. Await results. Call with any concerns.    Relevant Orders   CT Abdomen Pelvis W Contrast   RLQ abdominal pain       Will check CT abdomen. Await results.    Relevant Orders   CT Abdomen Pelvis W Contrast       Follow up plan: No follow-ups on file.   . This visit was completed via FaceTime due to the restrictions of the COVID-19 pandemic. All issues as above were discussed and addressed. Physical exam was done as above through visual confirmation on FaceTime. If it was felt that the patient should be evaluated in the office, they were directed there. The patient verbally consented to this visit. . Location of the patient: home . Location of the provider: work . Those involved with this call:  . Provider: Park Liter, DO . CMA: Lesle Chris, [place] . Front Desk/Registration: Don Perking  . Time spent on call: 15 minutes with patient face to face via video conference. More than 50% of this time was spent in counseling and coordination of care. 23 minutes total spent in review of patient's record and preparation of their chart.

## 2018-10-23 ENCOUNTER — Other Ambulatory Visit: Payer: Self-pay | Admitting: Family Medicine

## 2018-10-23 NOTE — Telephone Encounter (Signed)
Patient has appointment 8/17

## 2018-11-16 ENCOUNTER — Ambulatory Visit: Payer: BC Managed Care – PPO | Admitting: Family Medicine

## 2018-11-17 ENCOUNTER — Ambulatory Visit: Payer: BC Managed Care – PPO | Admitting: Family Medicine

## 2018-11-23 ENCOUNTER — Encounter: Payer: Self-pay | Admitting: Family Medicine

## 2018-11-23 ENCOUNTER — Ambulatory Visit (INDEPENDENT_AMBULATORY_CARE_PROVIDER_SITE_OTHER): Payer: BC Managed Care – PPO | Admitting: Family Medicine

## 2018-11-23 ENCOUNTER — Other Ambulatory Visit: Payer: Self-pay

## 2018-11-23 VITALS — BP 119/82 | HR 88 | Temp 98.6°F | Ht 65.0 in | Wt 149.0 lb

## 2018-11-23 DIAGNOSIS — I10 Essential (primary) hypertension: Secondary | ICD-10-CM | POA: Diagnosis not present

## 2018-11-23 DIAGNOSIS — Z13220 Encounter for screening for lipoid disorders: Secondary | ICD-10-CM | POA: Diagnosis not present

## 2018-11-23 DIAGNOSIS — G4700 Insomnia, unspecified: Secondary | ICD-10-CM | POA: Diagnosis not present

## 2018-11-23 DIAGNOSIS — F339 Major depressive disorder, recurrent, unspecified: Secondary | ICD-10-CM | POA: Diagnosis not present

## 2018-11-23 DIAGNOSIS — R197 Diarrhea, unspecified: Secondary | ICD-10-CM

## 2018-11-23 LAB — MICROALBUMIN, URINE WAIVED
Creatinine, Urine Waived: 200 mg/dL (ref 10–300)
Microalb, Ur Waived: 30 mg/L — ABNORMAL HIGH (ref 0–19)
Microalb/Creat Ratio: <30 mg/g (ref <30)

## 2018-11-23 MED ORDER — DULOXETINE HCL 60 MG PO CPEP: 60.0000 mg | ORAL_CAPSULE | Freq: Every day | ORAL | 1 refills | Status: DC

## 2018-11-23 MED ORDER — ZOLPIDEM TARTRATE 10 MG PO TABS: 10.0000 mg | ORAL_TABLET | Freq: Every evening | ORAL | 5 refills | Status: DC | PRN

## 2018-11-23 MED ORDER — METOPROLOL SUCCINATE ER 50 MG PO TB24: 50.0000 mg | ORAL_TABLET | Freq: Every day | ORAL | 1 refills | Status: DC

## 2018-11-23 MED ORDER — LISINOPRIL 5 MG PO TABS: 5.0000 mg | ORAL_TABLET | Freq: Every day | ORAL | 1 refills | Status: DC

## 2018-11-23 NOTE — Progress Notes (Signed)
BP 119/82   Pulse 88   Temp 98.6 F (37 C) (Oral)   Ht 5\' 5"  (1.651 m)   Wt 149 lb (67.6 kg)   SpO2 96%   BMI 24.79 kg/m    Subjective:    Patient ID: Morgan Cisneros, female    DOB: [birth year], 53 y.o.   MRN: [id number]  HPI: Morgan Cisneros is a 53 y.o. female  Chief Complaint  Patient presents with  . Hypertension    f/u   HYPERTENSION Hypertension status: controlled  Satisfied with current treatment? yes Duration of hypertension: chronic BP monitoring frequency:  not checking BP medication side effects:  no Medication compliance: excellent compliance Previous BP meds: lisinopril, metoprolol Aspirin: no Recurrent headaches: no Visual changes: no Palpitations: no Dyspnea: no Chest pain: no Lower extremity edema: no Dizzy/lightheaded: no  ANXIETY/STRESS Duration:better Anxious mood: yes  Excessive worrying: no Irritability: no  Sweating: no Nausea: no Palpitations:no Hyperventilation: no Panic attacks: no Agoraphobia: no  Obscessions/compulsions: no Depressed mood: yes Depression screen [facility] 2/9 11/23/2018 07/14/2018 04/21/2018 01/13/2018 11/11/2017  Decreased Interest 0 1 2 1 3   Down, Depressed, Hopeless 0 0 2 1 2   PHQ - 2 Score 0 1 4 2 5   Altered sleeping 1 1 3 2 2   Tired, decreased energy 2 3 3 3 2   Change in appetite 0 0 0 0 2  Feeling bad or failure about yourself  0 0 0 0 0  Trouble concentrating 1 1 3 3 3   Moving slowly or fidgety/restless 0 0 2 0 0  Suicidal thoughts 0 0 0 0 0  PHQ-9 Score 4 6 15 10 14   Difficult doing work/chores Somewhat difficult Not difficult at all Somewhat difficult Somewhat difficult Somewhat difficult   GAD 7 : Generalized Anxiety Score 11/23/2018 07/14/2018 04/21/2018 01/13/2018  Nervous, Anxious, on Edge 1 3 2 2   Control/stop worrying 1 3 3 3   Worry too much - different things 1 3 3 3   Trouble relaxing 1 3 3 3   Restless 0 0 2 3  Easily annoyed or irritable 0 0 0 2  Afraid - awful might happen 1 0 2 2  Total  GAD 7 Score 5 12 15 18   Anxiety Difficulty Not difficult at all Not difficult at all Not difficult at all Somewhat difficult   Anhedonia: no Weight changes: no Insomnia: no   Hypersomnia: no Fatigue/loss of energy: yes Feelings of worthlessness: no Feelings of guilt: no Impaired concentration/indecisiveness: yes Suicidal ideations: no  Crying spells: no Recent Stressors/Life Changes: yes   Relationship problems: no   Family stress: yes     Financial stress: yes    Job stress: yes    Recent death/loss: no  INSOMNIA Duration: chronic Satisfied with sleep quality: yes Difficulty falling asleep: no Difficulty staying asleep: no Waking a few hours after sleep onset: no Early morning awakenings: no Daytime hypersomnolence: no Wakes feeling refreshed: yes Good sleep hygiene: yes Apnea: no Snoring: no Depressed/anxious mood: yes Recent stress: yes Restless legs/nocturnal leg cramps: no Chronic pain/arthritis: no History of sleep study: no Treatments attempted:    Relevant past medical, surgical, family and social history reviewed and updated as indicated. Interim medical history since our last visit reviewed. Allergies and medications reviewed and updated.  Review of Systems  Constitutional: Negative.   Respiratory: Negative.   Cardiovascular: Negative.   Gastrointestinal: Positive for abdominal pain, constipation and diarrhea.  Neurological: Negative.   Psychiatric/Behavioral: Negative.     Per HPI unless  specifically indicated above     Objective:    BP 119/82   Pulse 88   Temp 98.6 F (37 C) (Oral)   Ht 5\' 5"  (1.651 m)   Wt 149 lb (67.6 kg)   SpO2 96%   BMI 24.79 kg/m   Wt Readings from Last 3 Encounters:  11/23/18 149 lb (67.6 kg)  10/16/18 145 lb (65.8 kg)  10/05/18 143 lb (64.9 kg)    Physical Exam Vitals signs and nursing note reviewed.  Constitutional:      General: She is not in acute distress.    Appearance: Normal appearance. She is not  ill-appearing, toxic-appearing or diaphoretic.  HENT:     Head: Normocephalic and atraumatic.     Right Ear: External ear normal.     Left Ear: External ear normal.     Nose: Nose normal.     Mouth/Throat:     Mouth: Mucous membranes are moist.     Pharynx: Oropharynx is clear.  Eyes:     General: No scleral icterus.       Right eye: No discharge.        Left eye: No discharge.     Extraocular Movements: Extraocular movements intact.     Conjunctiva/sclera: Conjunctivae normal.     Pupils: Pupils are equal, round, and reactive to light.  Neck:     Musculoskeletal: Normal range of motion and neck supple.  Cardiovascular:     Rate and Rhythm: Normal rate and regular rhythm.     Pulses: Normal pulses.     Heart sounds: Normal heart sounds. No murmur. No friction rub. No gallop.   Pulmonary:     Effort: Pulmonary effort is normal. No respiratory distress.     Breath sounds: Normal breath sounds. No stridor. No wheezing, rhonchi or rales.  Chest:     Chest wall: No tenderness.  Musculoskeletal: Normal range of motion.  Skin:    General: Skin is warm and dry.     Capillary Refill: Capillary refill takes less than 2 seconds.     Coloration: Skin is not jaundiced or pale.     Findings: No bruising, erythema, lesion or rash.  Neurological:     General: No focal deficit present.     Mental Status: She is alert and oriented to person, place, and time. Mental status is at baseline.  Psychiatric:        Mood and Affect: Mood normal.        Behavior: Behavior normal.        Thought Content: Thought content normal.        Judgment: Judgment normal.     Results for orders placed or performed in visit on 29/51/88  Basic metabolic panel  Result Value Ref Range   Glucose 128 (H) 65 - 99 mg/dL   BUN 16 6 - 24 mg/dL   Creatinine, Ser 0.62 0.57 - 1.00 mg/dL   GFR calc non Af Amer 105 >59 mL/min/1.73   GFR calc Af Amer 121 >59 mL/min/1.73   BUN/Creatinine Ratio 26 (H) 9 - 23   Sodium  139 134 - 144 mmol/L   Potassium 4.3 3.5 - 5.2 mmol/L   Chloride 104 96 - 106 mmol/L   CO2 20 20 - 29 mmol/L   Calcium 9.7 8.7 - 10.2 mg/dL  Lipid Panel w/o Chol/HDL Ratio  Result Value Ref Range   Cholesterol, Total 251 (H) 100 - 199 mg/dL   Triglycerides 405 (H) 0 - 149 mg/dL  HDL 45 >39 mg/dL   VLDL Cholesterol Cal Comment 5 - 40 mg/dL   LDL Calculated Comment 0 - 99 mg/dL  Microalbumin, Urine Waived  Result Value Ref Range   Microalb, Ur Waived 30 (H) 0 - 19 mg/L   Creatinine, Urine Waived 200 10 - 300 mg/dL   Microalb/Creat Ratio <30 <30 mg/g      Assessment & Plan:   Problem List Items Addressed This Visit      Cardiovascular and Mediastinum   Essential hypertension - Primary    Under good control on current regimen. Continue current regimen. Continue to monitor. Call with any concerns. Refills given. Labs drawn today.        Relevant Medications   metoprolol succinate (TOPROL-XL) 50 MG 24 hr tablet   lisinopril (ZESTRIL) 5 MG tablet   Other Relevant Orders   Basic metabolic panel (Completed)   Microalbumin, Urine Waived (Completed)     Other   Insomnia    Under good control on current regimen- tolerating it well. Continue current regimen. Continue to monitor. Call with any concerns. Refills given.        Depression, recurrent ([place])    Under good control on current regimen. Continue current regimen. Continue to monitor. Call with any concerns. Refills given.        Relevant Medications   DULoxetine (CYMBALTA) 60 MG capsule    Other Visit Diagnoses    Screening for cholesterol level       Labs drawn today. Await results.    Relevant Orders   Lipid Panel w/o Chol/HDL Ratio (Completed)   Diarrhea, unspecified type       Not doing well. Would like to go back to GI- will call them.       Follow up plan: Return in about 6 months (around 05/26/2019) for Physical.

## 2018-11-24 LAB — LIPID PANEL W/O CHOL/HDL RATIO
Cholesterol, Total: 251 mg/dL — ABNORMAL HIGH (ref 100–199)
HDL: 45 mg/dL (ref >39)
Triglycerides: 405 mg/dL — ABNORMAL HIGH (ref 0–149)

## 2018-11-24 LAB — BASIC METABOLIC PANEL
BUN/Creatinine Ratio: 26 — ABNORMAL HIGH (ref 9–23)
BUN: 16 mg/dL (ref 6–24)
CO2: 20 mmol/L (ref 20–29)
Calcium: 9.7 mg/dL (ref 8.7–10.2)
Chloride: 104 mmol/L (ref 96–106)
Creatinine, Ser: 0.62 mg/dL (ref 0.57–1.00)
GFR calc Af Amer: 121 mL/min/{1.73_m2} (ref >59)
GFR calc non Af Amer: 105 mL/min/{1.73_m2} (ref >59)
Glucose: 128 mg/dL — ABNORMAL HIGH (ref 65–99)
Potassium: 4.3 mmol/L (ref 3.5–5.2)
Sodium: 139 mmol/L (ref 134–144)

## 2018-11-26 NOTE — Assessment & Plan Note (Signed)
Under good control on current regimen. Continue current regimen. Continue to monitor. Call with any concerns. Refills given. Labs drawn today.   

## 2018-11-26 NOTE — Assessment & Plan Note (Signed)
Under good control on current regimen- tolerating it well. Continue current regimen. Continue to monitor. Call with any concerns. Refills given.

## 2018-11-26 NOTE — Assessment & Plan Note (Signed)
Under good control on current regimen. Continue current regimen. Continue to monitor. Call with any concerns. Refills given.   

## 2018-11-27 ENCOUNTER — Other Ambulatory Visit: Payer: Self-pay | Admitting: Family Medicine

## 2018-11-27 MED ORDER — LISINOPRIL 5 MG PO TABS: ORAL_TABLET | ORAL | 1 refills | Status: DC

## 2018-12-05 ENCOUNTER — Ambulatory Visit
Admission: RE | Admit: 2018-12-05 | Discharge: 2018-12-05 | Disposition: A | Discharge status: Home / Self Care | Payer: BC Managed Care – PPO | Source: Ambulatory Visit | Attending: Family Medicine | Admitting: Family Medicine

## 2018-12-05 DIAGNOSIS — Z1231 Encounter for screening mammogram for malignant neoplasm of breast: Secondary | ICD-10-CM | POA: Insufficient documentation

## 2018-12-06 ENCOUNTER — Other Ambulatory Visit: Payer: Self-pay | Admitting: Family Medicine

## 2018-12-06 DIAGNOSIS — R928 Other abnormal and inconclusive findings on diagnostic imaging of breast: Secondary | ICD-10-CM

## 2018-12-06 DIAGNOSIS — N6310 Unspecified lump in the right breast, unspecified quadrant: Secondary | ICD-10-CM

## 2018-12-15 ENCOUNTER — Ambulatory Visit
Admission: RE | Admit: 2018-12-15 | Discharge: 2018-12-15 | Disposition: A | Discharge status: Home / Self Care | Payer: BC Managed Care – PPO | Source: Ambulatory Visit | Attending: Family Medicine | Admitting: Family Medicine

## 2018-12-15 DIAGNOSIS — R928 Other abnormal and inconclusive findings on diagnostic imaging of breast: Secondary | ICD-10-CM | POA: Insufficient documentation

## 2018-12-15 DIAGNOSIS — N6310 Unspecified lump in the right breast, unspecified quadrant: Secondary | ICD-10-CM

## 2019-01-04 DIAGNOSIS — M720 Palmar fascial fibromatosis [Dupuytren]: Secondary | ICD-10-CM | POA: Insufficient documentation

## 2019-01-10 ENCOUNTER — Ambulatory Visit: Payer: BC Managed Care – PPO

## 2019-01-11 ENCOUNTER — Ambulatory Visit (INDEPENDENT_AMBULATORY_CARE_PROVIDER_SITE_OTHER): Payer: BC Managed Care – PPO

## 2019-01-11 ENCOUNTER — Other Ambulatory Visit: Payer: Self-pay

## 2019-01-11 DIAGNOSIS — Z23 Encounter for immunization: Secondary | ICD-10-CM

## 2019-01-24 ENCOUNTER — Encounter: Payer: Self-pay | Admitting: Family Medicine

## 2019-01-29 MED ORDER — DULOXETINE HCL 20 MG PO CPEP: 20.0000 mg | ORAL_CAPSULE | Freq: Every day | ORAL | 5 refills | Status: DC

## 2019-02-08 ENCOUNTER — Encounter: Payer: Self-pay | Admitting: Family Medicine

## 2019-02-08 ENCOUNTER — Ambulatory Visit: Payer: BC Managed Care – PPO | Admitting: Family Medicine

## 2019-02-08 NOTE — Telephone Encounter (Signed)
Called pt, set up virtual apt.Pt stated understanding.

## 2019-02-09 ENCOUNTER — Encounter: Payer: Self-pay | Admitting: Family Medicine

## 2019-02-09 ENCOUNTER — Other Ambulatory Visit: Payer: Self-pay

## 2019-02-09 ENCOUNTER — Ambulatory Visit (INDEPENDENT_AMBULATORY_CARE_PROVIDER_SITE_OTHER): Payer: BC Managed Care – PPO | Admitting: Family Medicine

## 2019-02-09 DIAGNOSIS — F419 Anxiety disorder, unspecified: Secondary | ICD-10-CM

## 2019-02-09 DIAGNOSIS — I10 Essential (primary) hypertension: Secondary | ICD-10-CM | POA: Diagnosis not present

## 2019-02-09 MED ORDER — METOPROLOL SUCCINATE ER 100 MG PO TB24: 100.0000 mg | ORAL_TABLET | Freq: Every day | ORAL | 1 refills | Status: DC

## 2019-02-09 NOTE — Progress Notes (Signed)
BP 131/90   Pulse (!) 103   Temp 97.8 F (36.6 C) (Oral)   Wt 148 lb 9.6 oz (67.4 kg)   BMI 24.73 kg/m    Subjective:    Patient ID: Morgan Cisneros, female    DOB: 1966-03-15, 53 y.o.   MRN: [id number]  HPI: Morgan Cisneros is a 53 y.o. female  Chief Complaint  Patient presents with  . Hypertension    pt states she has been getting elevated BP readings at home    Has been tapering off her cymbalta. She is currently down to 20mg . Having some anxiety with going down on the medicine- has been having some heart racing and some increased headaches. Has been having increased stress with work. She notes that she tries to calm herself down   HYPERTENSION Hypertension status: exacerbated  Satisfied with current treatment? no Duration of hypertension: chronic BP monitoring frequency:  a few times a month BP range: 135/98 BP medication side effects:  no Medication compliance: excellent compliance Previous BP meds: lisinopril Aspirin: no Recurrent headaches: yes Visual changes: no Palpitations: yes Dyspnea: no Chest pain: no Lower extremity edema: no Dizzy/lightheaded: no  Relevant past medical, surgical, family and social history reviewed and updated as indicated. Interim medical history since our last visit reviewed. Allergies and medications reviewed and updated.  Review of Systems  Constitutional: Negative.   Respiratory: Negative.   Cardiovascular: Negative.   Musculoskeletal: Negative.   Skin: Negative.   Neurological: Positive for headaches. Negative for dizziness, tremors, seizures, syncope, facial asymmetry, speech difficulty, weakness, light-headedness and numbness.  Psychiatric/Behavioral: Positive for dysphoric mood. Negative for agitation, behavioral problems, confusion, decreased concentration, hallucinations, self-injury, sleep disturbance and suicidal ideas. The patient is nervous/anxious. The patient is not hyperactive.     Per HPI unless  specifically indicated above     Objective:    BP 131/90   Pulse (!) 103   Temp 97.8 F (36.6 C) (Oral)   Wt 148 lb 9.6 oz (67.4 kg)   BMI 24.73 kg/m   Wt Readings from Last 3 Encounters:  02/09/19 148 lb 9.6 oz (67.4 kg)  11/23/18 149 lb (67.6 kg)  10/16/18 145 lb (65.8 kg)    Physical Exam Vitals signs and nursing note reviewed.  Constitutional:      General: She is not in acute distress.    Appearance: Normal appearance. She is not ill-appearing, toxic-appearing or diaphoretic.  HENT:     Head: Normocephalic and atraumatic.     Right Ear: External ear normal.     Left Ear: External ear normal.     Nose: Nose normal.     Mouth/Throat:     Mouth: Mucous membranes are moist.     Pharynx: Oropharynx is clear.  Eyes:     General: No scleral icterus.       Right eye: No discharge.        Left eye: No discharge.     Conjunctiva/sclera: Conjunctivae normal.     Pupils: Pupils are equal, round, and reactive to light.  Neck:     Musculoskeletal: Normal range of motion.  Pulmonary:     Effort: Pulmonary effort is normal. No respiratory distress.     Comments: Speaking in full sentences Musculoskeletal: Normal range of motion.  Skin:    Coloration: Skin is not jaundiced or pale.     Findings: No bruising, erythema, lesion or rash.  Neurological:     Mental Status: She is alert and oriented to  person, place, and time. Mental status is at baseline.  Psychiatric:        Mood and Affect: Mood normal.        Behavior: Behavior normal.        Thought Content: Thought content normal.        Judgment: Judgment normal.     Results for orders placed or performed in visit on [id number]  Basic metabolic panel  Result Value Ref Range   Glucose 128 (H) 65 - 99 mg/dL   BUN 16 6 - 24 mg/dL   Creatinine, Ser 0.62 0.57 - 1.00 mg/dL   GFR calc non Af Amer 105 >59 mL/min/1.73   GFR calc Af Amer 121 >59 mL/min/1.73   BUN/Creatinine Ratio 26 (H) 9 - 23   Sodium 139 134 - 144 mmol/L    Potassium 4.3 3.5 - 5.2 mmol/L   Chloride 104 96 - 106 mmol/L   CO2 20 20 - 29 mmol/L   Calcium 9.7 8.7 - 10.2 mg/dL  Lipid Panel w/o Chol/HDL Ratio  Result Value Ref Range   Cholesterol, Total 251 (H) 100 - 199 mg/dL   Triglycerides 405 (H) 0 - 149 mg/dL   HDL 45 >39 mg/dL   VLDL Cholesterol Cal Comment 5 - 40 mg/dL   LDL Calculated Comment 0 - 99 mg/dL  Microalbumin, Urine Waived  Result Value Ref Range   Microalb, Ur Waived 30 (H) 0 - 19 mg/L   Creatinine, Urine Waived 200 10 - 300 mg/dL   Microalb/Creat Ratio <30 <30 mg/g      Assessment & Plan:   Problem List Items Addressed This Visit      Cardiovascular and Mediastinum   Essential hypertension    Running slightly high with a high HR- will increase her metoprolol to 100mg  daily. Recheck 1 month. Call with any concerns.       Relevant Medications   metoprolol succinate (TOPROL-XL) 100 MG 24 hr tablet     Other   Anxiety    Has been weaning down on her cymbalta. Discussed that if she starts feeling worse will go back up on it. Continue to monitor. Recheck 1 month.           Follow up plan: Return in about 4 weeks (around 03/09/2019).    . This visit was completed via FaceTime due to the restrictions of the COVID-19 pandemic. All issues as above were discussed and addressed. Physical exam was done as above through visual confirmation on FaceTIme. If it was felt that the patient should be evaluated in the office, they were directed there. The patient verbally consented to this visit. . Location of the patient: home . Location of the provider: home . Those involved with this call:  . Provider: Park Liter, DO . CMA: Yvonna Alanis, [place] . Front Desk/Registration: Don Perking  . Time spent on call: 15 minutes with patient face to face via video conference. More than 50% of this time was spent in counseling and coordination of care. 23 minutes total spent in review of patient's record and preparation of  their chart.

## 2019-02-09 NOTE — Assessment & Plan Note (Signed)
Running slightly high with a high HR- will increase her metoprolol to 100mg  daily. Recheck 1 month. Call with any concerns.

## 2019-02-09 NOTE — Assessment & Plan Note (Signed)
Has been weaning down on her cymbalta. Discussed that if she starts feeling worse will go back up on it. Continue to monitor. Recheck 1 month.

## 2019-02-20 ENCOUNTER — Encounter: Payer: Self-pay | Admitting: Family Medicine

## 2019-02-20 MED ORDER — DULOXETINE HCL 40 MG PO CPEP: 40.0000 mg | ORAL_CAPSULE | Freq: Every day | ORAL | 5 refills | Status: DC

## 2019-03-23 ENCOUNTER — Encounter: Payer: Self-pay | Admitting: Family Medicine

## 2019-03-23 ENCOUNTER — Telehealth: Payer: Self-pay | Admitting: Family Medicine

## 2019-03-23 NOTE — Telephone Encounter (Signed)
Patient is calling with sty on my right bottom eye lid.  I have been treating it with over the counter medication with no result. Patient called to schedule virtual appt no available appts for today. Appt scheduled for Monday at 8:15a. Patient would like to know is there any medications that she can try to improve the sty OTC. Or can something be called in for her? Please advise.  Preferred Pharmacy-[facility]  [phone]

## 2019-03-26 ENCOUNTER — Telehealth: Payer: BC Managed Care – PPO | Admitting: Family Medicine

## 2019-03-26 NOTE — Telephone Encounter (Signed)
Patient cancelled appointment for this morning.

## 2019-05-02 ENCOUNTER — Encounter: Payer: Self-pay | Admitting: Family Medicine

## 2019-05-02 DIAGNOSIS — R928 Other abnormal and inconclusive findings on diagnostic imaging of breast: Secondary | ICD-10-CM

## 2019-05-23 ENCOUNTER — Other Ambulatory Visit: Payer: Self-pay | Admitting: Family Medicine

## 2019-05-23 ENCOUNTER — Other Ambulatory Visit: Payer: Self-pay

## 2019-05-23 ENCOUNTER — Encounter: Payer: Self-pay | Admitting: Family Medicine

## 2019-05-23 DIAGNOSIS — R928 Other abnormal and inconclusive findings on diagnostic imaging of breast: Secondary | ICD-10-CM

## 2019-05-24 MED ORDER — ZOLPIDEM TARTRATE 10 MG PO TABS: 10.0000 mg | ORAL_TABLET | Freq: Every evening | ORAL | 0 refills | Status: DC | PRN

## 2019-06-01 ENCOUNTER — Encounter: Payer: Self-pay | Admitting: Family Medicine

## 2019-06-01 ENCOUNTER — Other Ambulatory Visit: Payer: Self-pay

## 2019-06-01 ENCOUNTER — Other Ambulatory Visit: Payer: Self-pay | Admitting: Family Medicine

## 2019-06-01 ENCOUNTER — Ambulatory Visit (INDEPENDENT_AMBULATORY_CARE_PROVIDER_SITE_OTHER): Payer: BC Managed Care – PPO | Admitting: Family Medicine

## 2019-06-01 VITALS — BP 119/78 | HR 95 | Temp 98.5°F | Ht 65.65 in | Wt 152.0 lb

## 2019-06-01 DIAGNOSIS — Z0000 Encounter for general adult medical examination without abnormal findings: Secondary | ICD-10-CM

## 2019-06-01 DIAGNOSIS — R232 Flushing: Secondary | ICD-10-CM

## 2019-06-01 DIAGNOSIS — F419 Anxiety disorder, unspecified: Secondary | ICD-10-CM | POA: Diagnosis not present

## 2019-06-01 DIAGNOSIS — G4700 Insomnia, unspecified: Secondary | ICD-10-CM

## 2019-06-01 DIAGNOSIS — F339 Major depressive disorder, recurrent, unspecified: Secondary | ICD-10-CM | POA: Diagnosis not present

## 2019-06-01 DIAGNOSIS — R311 Benign essential microscopic hematuria: Secondary | ICD-10-CM

## 2019-06-01 DIAGNOSIS — I10 Essential (primary) hypertension: Secondary | ICD-10-CM | POA: Diagnosis not present

## 2019-06-01 MED ORDER — DULOXETINE HCL 40 MG PO CPEP: 40.0000 mg | ORAL_CAPSULE | Freq: Every day | ORAL | 1 refills | Status: DC

## 2019-06-01 MED ORDER — METOPROLOL SUCCINATE ER 100 MG PO TB24: 100.0000 mg | ORAL_TABLET | Freq: Every day | ORAL | 1 refills | Status: DC

## 2019-06-01 MED ORDER — ZOLPIDEM TARTRATE 10 MG PO TABS: 10.0000 mg | ORAL_TABLET | Freq: Every evening | ORAL | 5 refills | Status: DC | PRN

## 2019-06-01 MED ORDER — LISINOPRIL 5 MG PO TABS: ORAL_TABLET | ORAL | 1 refills | Status: DC

## 2019-06-01 NOTE — Patient Instructions (Signed)
Health Maintenance, Female Adopting a healthy lifestyle and getting preventive care are important in promoting health and wellness. Ask your health care provider about:  The right schedule for you to have regular tests and exams.  Things you can do on your own to prevent diseases and keep yourself healthy. What should I know about diet, weight, and exercise? Eat a healthy diet   Eat a diet that includes plenty of vegetables, fruits, low-fat dairy products, and lean protein.  Do not eat a lot of foods that are high in solid fats, added sugars, or sodium. Maintain a healthy weight Body mass index (BMI) is used to identify weight problems. It estimates body fat based on height and weight. Your health care provider can help determine your BMI and help you achieve or maintain a healthy weight. Get regular exercise Get regular exercise. This is one of the most important things you can do for your health. Most adults should:  Exercise for at least 150 minutes each week. The exercise should increase your heart rate and make you sweat (moderate-intensity exercise).  Do strengthening exercises at least twice a week. This is in addition to the moderate-intensity exercise.  Spend less time sitting. Even light physical activity can be beneficial. Watch cholesterol and blood lipids Have your blood tested for lipids and cholesterol at 54 years of age, then have this test every 5 years. Have your cholesterol levels checked more often if:  Your lipid or cholesterol levels are high.  You are older than 53 years of age.  You are at high risk for heart disease. What should I know about cancer screening? Depending on your health history and family history, you may need to have cancer screening at various ages. This may include screening for:  Breast cancer.  Cervical cancer.  Colorectal cancer.  Skin cancer.  Lung cancer. What should I know about heart disease, diabetes, and high blood  pressure? Blood pressure and heart disease  High blood pressure causes heart disease and increases the risk of stroke. This is more likely to develop in people who have high blood pressure readings, are of African descent, or are overweight.  Have your blood pressure checked: ? Every 3-5 years if you are 18-39 years of age. ? Every year if you are 40 years old or older. Diabetes Have regular diabetes screenings. This checks your fasting blood sugar level. Have the screening done:  Once every three years after age 40 if you are at a normal weight and have a low risk for diabetes.  More often and at a younger age if you are overweight or have a high risk for diabetes. What should I know about preventing infection? Hepatitis B If you have a higher risk for hepatitis B, you should be screened for this virus. Talk with your health care provider to find out if you are at risk for hepatitis B infection. Hepatitis C Testing is recommended for:  Everyone born from 1945 through 1965.  Anyone with known risk factors for hepatitis C. Sexually transmitted infections (STIs)  Get screened for STIs, including gonorrhea and chlamydia, if: ? You are sexually active and are younger than 53 years of age. ? You are older than 53 years of age and your health care provider tells you that you are at risk for this type of infection. ? Your sexual activity has changed since you were last screened, and you are at increased risk for chlamydia or gonorrhea. Ask your health care provider if   you are at risk.  Ask your health care provider about whether you are at high risk for HIV. Your health care provider may recommend a prescription medicine to help prevent HIV infection. If you choose to take medicine to prevent HIV, you should first get tested for HIV. You should then be tested every 3 months for as long as you are taking the medicine. Pregnancy  If you are about to stop having your period (premenopausal) and  you may become pregnant, seek counseling before you get pregnant.  Take 400 to 800 micrograms (mcg) of folic acid every day if you become pregnant.  Ask for birth control (contraception) if you want to prevent pregnancy. Osteoporosis and menopause Osteoporosis is a disease in which the bones lose minerals and strength with aging. This can result in bone fractures. If you are 65 years old or older, or if you are at risk for osteoporosis and fractures, ask your health care provider if you should:  Be screened for bone loss.  Take a calcium or vitamin D supplement to lower your risk of fractures.  Be given hormone replacement therapy (HRT) to treat symptoms of menopause. Follow these instructions at home: Lifestyle  Do not use any products that contain nicotine or tobacco, such as cigarettes, e-cigarettes, and chewing tobacco. If you need help quitting, ask your health care provider.  Do not use street drugs.  Do not share needles.  Ask your health care provider for help if you need support or information about quitting drugs. Alcohol use  Do not drink alcohol if: ? Your health care provider tells you not to drink. ? You are pregnant, may be pregnant, or are planning to become pregnant.  If you drink alcohol: ? Limit how much you use to 0-1 drink a day. ? Limit intake if you are breastfeeding.  Be aware of how much alcohol is in your drink. In the [place] one drink equals one 12 oz bottle of beer (355 mL), one 5 oz glass of wine (148 mL), or one 1 oz glass of hard liquor (44 mL). General instructions  Schedule regular health, dental, and eye exams.  Stay current with your vaccines.  Tell your health care provider if: ? You often feel depressed. ? You have ever been abused or do not feel safe at home. Summary  Adopting a healthy lifestyle and getting preventive care are important in promoting health and wellness.  Follow your health care provider's instructions about healthy  diet, exercising, and getting tested or screened for diseases.  Follow your health care provider's instructions on monitoring your cholesterol and blood pressure. This information is not intended to replace advice given to you by your health care provider. Make sure you discuss any questions you have with your health care provider. Document Revised: 03/15/2018 Document Reviewed: 03/15/2018 Elsevier Patient Education  2020 [employer]  

## 2019-06-01 NOTE — Progress Notes (Signed)
BP 119/78 (BP Location: Left Arm, Patient Position: Sitting, Cuff Size: Normal)   Pulse 95   Temp 98.5 F (36.9 C) (Oral)   Ht 5' 5.65" (1.668 m)   Wt 152 lb (68.9 kg)   SpO2 97%   BMI 24.80 kg/m    Subjective:    Patient ID: Morgan Cisneros, female    DOB: [birth year], 54 y.o.   MRN: [id number]  HPI: [name] is a 54 y.o. female presenting on 06/01/2019 for comprehensive medical examination. Current medical complaints include:  HYPERTENSION Hypertension status: controlled  Satisfied with current treatment? yes Duration of hypertension: chronic BP monitoring frequency:  not checking BP medication side effects:  no Medication compliance: excellent compliance Previous BP meds: lisinopril, metoprolol Aspirin: no Recurrent headaches: no Visual changes: no Palpitations: no Dyspnea: no Chest pain: no Lower extremity edema: no Dizzy/lightheaded: no  DEPRESSION Mood status: controlled Satisfied with current treatment?: yes Symptom severity: mild  Duration of current treatment : chronic Side effects: no Medication compliance: excellent compliance Psychotherapy/counseling: no  Previous psychiatric medications: duloxetine Depressed mood: no Anxious mood: yes Anhedonia: no Significant weight loss or gain: no Insomnia: yes Fatigue: yes Feelings of worthlessness or guilt: no Impaired concentration/indecisiveness: no Suicidal ideations: no Hopelessness: no Crying spells: no Depression screen [facility] 2/9 06/01/2019 11/23/2018 07/14/2018 04/21/2018 01/13/2018  Decreased Interest 0 0 1 2 1   Down, Depressed, Hopeless 0 0 0 2 1  PHQ - 2 Score 0 0 1 4 2   Altered sleeping 1 1 1 3 2   Tired, decreased energy 1 2 3 3 3   Change in appetite 0 0 0 0 0  Feeling bad or failure about yourself  0 0 0 0 0  Trouble concentrating 1 1 1 3 3   Moving slowly or fidgety/restless 0 0 0 2 0  Suicidal thoughts 0 0 0 0 0  PHQ-9 Score 3 4 6 15 10   Difficult doing work/chores Somewhat  difficult Somewhat difficult Not difficult at all Somewhat difficult Somewhat difficult  Some recent data might be hidden    INSOMNIA Duration: chronic Satisfied with sleep quality: yes Difficulty falling asleep: no Difficulty staying asleep: no Waking a few hours after sleep onset: no Early morning awakenings: no Daytime hypersomnolence: no Wakes feeling refreshed: yes Good sleep hygiene: yes Apnea: no Snoring: no Depressed/anxious mood: no Recent stress: no Restless legs/nocturnal leg cramps: no Chronic pain/arthritis: no History of sleep study: no Treatments attempted: melatonin, uinsom, benadryl and ambien   Menopausal Symptoms: yes- hot flashes, have been driving her nuts  Depression Screen done today and results listed below:  Depression screen [facility] 2/9 06/01/2019 11/23/2018 07/14/2018 04/21/2018 01/13/2018  Decreased Interest 0 0 1 2 1   Down, Depressed, Hopeless 0 0 0 2 1  PHQ - 2 Score 0 0 1 4 2   Altered sleeping 1 1 1 3 2   Tired, decreased energy 1 2 3 3 3   Change in appetite 0 0 0 0 0  Feeling bad or failure about yourself  0 0 0 0 0  Trouble concentrating 1 1 1 3 3   Moving slowly or fidgety/restless 0 0 0 2 0  Suicidal thoughts 0 0 0 0 0  PHQ-9 Score 3 4 6 15 10   Difficult doing work/chores Somewhat difficult Somewhat difficult Not difficult at all Somewhat difficult Somewhat difficult  Some recent data might be hidden    Past Medical History:  Past Medical History:  Diagnosis Date  . Abdominal pain   . Acute gastritis  without hemorrhage   . 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 Appendicitis 02/01/2017  . C. difficile colitis 10/2016  . Diverticulitis 10/2016  . Diverticulitis of large intestine without perforation or abscess without bleeding   . Flank pain   . Gross hematuria   . History of kidney stones   . Hypertension   . 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 Insomnia   . LBP (low back pain) 12/13/2014  . Ovarian cyst   . Renal calculus, right     . Renal calculus, right   . Thoracic back pain    Right sided    Surgical History:  Past Surgical History:  Procedure Laterality Date  . ABDOMINAL HYSTERECTOMY  08/2013  . ABDOMINAL HYSTERECTOMY    . BREAST BIOPSY Left 08/10/2016   [place] core  . COLONOSCOPY WITH PROPOFOL N/A 04/25/2017   Procedure: COLONOSCOPY WITH PROPOFOL;  Surgeon: Lucilla Lame, MD;  Location: [place];  Service: Endoscopy;  Laterality: N/A;  . CYSTOSCOPY  11/26/2010   with stent placement  . ESOPHAGOGASTRODUODENOSCOPY (EGD) WITH PROPOFOL N/A 04/25/2017   Procedure: ESOPHAGOGASTRODUODENOSCOPY (EGD) WITH PROPOFOL;  Surgeon: Lucilla Lame, MD;  Location: [place];  Service: Endoscopy;  Laterality: N/A;  . HEMORRHOID SURGERY    . kidney stent    . LAPAROSCOPIC APPENDECTOMY N/A 02/02/2017   Procedure: APPENDECTOMY LAPAROSCOPIC;  Surgeon: Clayburn Pert, MD;  Location: [facility];  Service: General;  Laterality: N/A;  . LAPAROSCOPIC SIGMOID COLECTOMY N/A 11/01/2016   Procedure: LAPAROSCOPIC SIGMOID COLECTOMY;  Surgeon: Clayburn Pert, MD;  Location: [facility];  Service: General;  Laterality: N/A;  . LITHOTRIPSY     X 4  . POLYPECTOMY  04/25/2017   Procedure: POLYPECTOMY INTESTINAL;  Surgeon: Lucilla Lame, MD;  Location: [place];  Service: Endoscopy;;  . TONSILLECTOMY    . TUBAL LIGATION    . Ureteroscopy Right 11/21/1997   with holmium laser    Medications:  No current outpatient medications on file prior to visit.   No current facility-administered medications on file prior to visit.    Allergies:  Allergies  Allergen Reactions  . Wellbutrin [Bupropion] Other (See Comments)    irritiability  . Urocit-K [Potassium Citrate] Nausea Only    Social History:  Social History   Socioeconomic History  . Marital status: Married    Spouse name: Not on file  . Number of children: Not on file  . Years of education: Not on file  . Highest education level: Not on file   Occupational History  . Not on file  Tobacco Use  . Smoking status: Current Every Day Smoker    Packs/day: 0.25    Types: Cigarettes  . Smokeless tobacco: Never Used  Substance and Sexual Activity  . Alcohol use: Yes    Alcohol/week: 0.0 standard drinks    Comment: occasional  . Drug use: No  . Sexual activity: Yes    Birth control/protection: Surgical  Other Topics Concern  . Not on file  Social History Narrative  . Not on file   Social Determinants of Health   Financial Resource Strain:   . Difficulty of Paying Living Expenses: Not on file  Food Insecurity:   . Worried About Charity fundraiser in the Last Year: Not on file  . Ran Out of Food in the Last Year: Not on file  Transportation Needs:   . Lack of Transportation (Medical): Not on file  . Lack of Transportation (Non-Medical): Not on file  Physical Activity:   .  Days of Exercise per Week: Not on file  . Minutes of Exercise per Session: Not on file  Stress:   . Feeling of Stress : Not on file  Social Connections:   . Frequency of Communication with Friends and Family: Not on file  . Frequency of Social Gatherings with Friends and Family: Not on file  . Attends Religious Services: Not on file  . Active Member of Clubs or Organizations: Not on file  . Attends Archivist Meetings: Not on file  . Marital Status: Not on file  Intimate Partner Violence:   . Fear of Current or Ex-Partner: Not on file  . Emotionally Abused: Not on file  . Physically Abused: Not on file  . Sexually Abused: Not on file   Social History   Tobacco Use  Smoking Status Current Every Day Smoker  . Packs/day: 0.25  . Types: Cigarettes  Smokeless Tobacco Never Used   Social History   Substance and Sexual Activity  Alcohol Use Yes  . Alcohol/week: 0.0 standard drinks   Comment: occasional    Family History:  Family History  Problem Relation Age of Onset  . Kidney Stones Mother   . Ovarian cancer Mother   . Colon  cancer Mother   . Lung cancer Father   . Hyperlipidemia Sister   . Hypertension Sister   . Alzheimer's disease Maternal Grandmother   . Cancer Maternal Grandfather        Liver  . Alzheimer's disease Paternal Grandfather   . Hypertension Sister   . Hyperlipidemia Sister   . Breast cancer Neg Hx     Past medical history, surgical history, medications, allergies, family history and social history reviewed with patient today and changes made to appropriate areas of the chart.   Review of Systems  Constitutional: Positive for diaphoresis. Negative for chills, fever, malaise/fatigue and weight loss.  HENT: Negative.  Eyes: Negative.  Respiratory: Negative.  Cardiovascular: Negative.  Gastrointestinal: Positive for constipation and diarrhea. Negative for abdominal pain, blood in stool, heartburn, melena, nausea and vomiting.  Genitourinary: Negative.  Musculoskeletal: Negative.  Skin: Negative.  Neurological: Negative.  Endo/Heme/Allergies: Negative.  Psychiatric/Behavioral: Negative   All other ROS negative except what is listed above and in the HPI.      Objective:    BP 119/78 (BP Location: Left Arm, Patient Position: Sitting, Cuff Size: Normal)   Pulse 95   Temp 98.5 F (36.9 C) (Oral)   Ht 5' 5.65" (1.668 m)   Wt 152 lb (68.9 kg)   SpO2 97%   BMI 24.80 kg/m   Wt Readings from Last 3 Encounters:  06/01/19 152 lb (68.9 kg)  02/09/19 148 lb 9.6 oz (67.4 kg)  11/23/18 149 lb (67.6 kg)    Physical Exam Vitals and nursing note reviewed. Exam conducted with a chaperone present.  Constitutional:      General: She is not in acute distress.    Appearance: Normal appearance. She is not ill-appearing, toxic-appearing or diaphoretic.  HENT:     Head: Normocephalic and atraumatic.     Right Ear: Tympanic membrane, ear canal and external ear normal. There is no impacted cerumen.     Left Ear: Tympanic membrane, ear canal and external ear normal. There is no impacted cerumen.      Nose: Nose normal. No congestion or rhinorrhea.     Mouth/Throat:     Mouth: Mucous membranes are moist.     Pharynx: Oropharynx is clear. No oropharyngeal exudate or posterior  oropharyngeal erythema.  Eyes:     General: No scleral icterus.       Right eye: No discharge.        Left eye: No discharge.     Extraocular Movements: Extraocular movements intact.     Conjunctiva/sclera: Conjunctivae normal.     Pupils: Pupils are equal, round, and reactive to light.  Neck:     Vascular: No carotid bruit.  Cardiovascular:     Rate and Rhythm: Normal rate and regular rhythm.     Pulses: Normal pulses.     Heart sounds: No murmur. No friction rub. No gallop.   Pulmonary:     Effort: Pulmonary effort is normal. No respiratory distress.     Breath sounds: Normal breath sounds. No stridor. No wheezing, rhonchi or rales.  Chest:     Chest wall: No tenderness.     Breasts:        Right: Normal. No swelling, bleeding, inverted nipple, mass, nipple discharge, skin change or tenderness.        Left: Normal. No swelling, bleeding, inverted nipple, mass, nipple discharge, skin change or tenderness.  Abdominal:     General: Abdomen is flat. Bowel sounds are normal. There is no distension.     Palpations: Abdomen is soft. There is no mass.     Tenderness: There is no abdominal tenderness. There is no right CVA tenderness, left CVA tenderness, guarding or rebound.     Hernia: No hernia is present.  Genitourinary:    Comments: Pelvic exams deferred with shared decision making Musculoskeletal:        General: No swelling, tenderness, deformity or signs of injury.     Cervical back: Normal range of motion and neck supple. No rigidity. No muscular tenderness.     Right lower leg: No edema.     Left lower leg: No edema.  Lymphadenopathy:     Cervical: No cervical adenopathy.     Upper Body:     Right upper body: No supraclavicular, axillary or pectoral adenopathy.     Left upper body: No  supraclavicular, axillary or pectoral adenopathy.  Skin:    General: Skin is warm and dry.     Capillary Refill: Capillary refill takes less than 2 seconds.     Coloration: Skin is not jaundiced or pale.     Findings: No bruising, erythema, lesion or rash.  Neurological:     General: No focal deficit present.     Mental Status: She is alert and oriented to person, place, and time. Mental status is at baseline.     Cranial Nerves: No cranial nerve deficit.     Sensory: No sensory deficit.     Motor: No weakness.     Coordination: Coordination normal.     Gait: Gait normal.     Deep Tendon Reflexes: Reflexes normal.  Psychiatric:        Mood and Affect: Mood normal.        Behavior: Behavior normal.        Thought Content: Thought content normal.        Judgment: Judgment normal.     Results for orders placed or performed in visit on 06/01/19  Microscopic Examination   URINE  Result Value Ref Range   WBC, UA 0-5 0 - 5 /hpf   RBC 3-10 (A) 0 - 2 /hpf   Epithelial Cells (non renal) 0-10 0 - 10 /hpf   Mucus, UA Present Not Estab.   Bacteria,  UA Few (A) None seen/Few   Yeast, UA Present None seen  Microalbumin, Urine Waived  Result Value Ref Range   Microalb, Ur Waived 10 0 - 19 mg/L   Creatinine, Urine Waived 200 10 - 300 mg/dL   Microalb/Creat Ratio <30 <30 mg/g  UA/M w/rflx Culture, Routine   Specimen: Urine   URINE  Result Value Ref Range   Specific Gravity, UA >1.030 (H) 1.005 - 1.030   pH, UA 5.5 5.0 - 7.5   Color, UA Yellow Yellow   Appearance Ur Clear Clear   Leukocytes,UA Negative Negative   Protein,UA Negative Negative/Trace   Glucose, UA Negative Negative   Ketones, UA Negative Negative   RBC, UA 2+ (A) Negative   Bilirubin, UA Negative Negative   Urobilinogen, Ur 0.2 0.2 - 1.0 mg/dL   Nitrite, UA Negative Negative   Microscopic Examination See below:       Assessment & Plan:   Problem List Items Addressed This Visit      Cardiovascular and Mediastinum    Essential hypertension    Under good control on current regimen. Continue current regimen. Continue to monitor. Call with any concerns. Refills given. Labs drawn today.        Relevant Medications   metoprolol succinate (TOPROL-XL) 100 MG 24 hr tablet   lisinopril (ZESTRIL) 5 MG tablet   Other Relevant Orders   CBC with Differential/Platelet   Comprehensive metabolic panel   Microalbumin, Urine Waived (Completed)   TSH   UA/M w/rflx Culture, Routine (Completed)     Other   Insomnia    Under good control on current regimen. Continue current regimen. Continue to monitor. Call with any concerns. Refills given. Labs drawn today. Has been cutting down on her Lorrin Mais and trying to take it only as needed.        Relevant Orders   CBC with Differential/Platelet   Comprehensive metabolic panel   TSH   Anxiety    Under good control on current regimen. Continue current regimen. Continue to monitor. Call with any concerns. Refills given. Labs drawn today.        Relevant Medications   DULoxetine HCl 40 MG CPEP   Other Relevant Orders   CBC with Differential/Platelet   Comprehensive metabolic panel   TSH   Depression, recurrent ([place])    Under good control on current regimen. Continue current regimen. Continue to monitor. Call with any concerns. Refills given. Labs drawn today.        Relevant Medications   DULoxetine HCl 40 MG CPEP   Other Relevant Orders   CBC with Differential/Platelet   Comprehensive metabolic panel   TSH    Other Visit Diagnoses    Routine general medical examination at a health care facility    -  Primary   Vaccines up to date. Screening labs checked today. Mammogram order in. Colonoscopy up to date. Continue diet and exercise. Call with any concerns.    Relevant Orders   CBC with Differential/Platelet   Comprehensive metabolic panel   Lipid Panel w/o Chol/HDL Ratio   Microalbumin, Urine Waived (Completed)   TSH   UA/M w/rflx Culture, Routine  (Completed)   Hot flashes       Will start gabapentin and recheck 1-2 months. Call with any concerns. Continue to monitor.    Relevant Medications   metoprolol succinate (TOPROL-XL) 100 MG 24 hr tablet   lisinopril (ZESTRIL) 5 MG tablet       Follow up plan: Return 1-2  months follow up hot flashes, virtual OK.   LABORATORY TESTING:  - Pap smear: not applicable  IMMUNIZATIONS:   - Tdap: Tetanus vaccination status reviewed: last tetanus booster within 10 years. - Influenza: Up to date - Pneumovax: Up to date  SCREENING: -Mammogram: Ordered today  - Colonoscopy: Up to date   PATIENT COUNSELING:   Advised to take 1 mg of folate supplement per day if capable of pregnancy.   Sexuality: Discussed sexually transmitted diseases, partner selection, use of condoms, avoidance of unintended pregnancy  and contraceptive alternatives.   Advised to avoid cigarette smoking.  I discussed with the patient that most people either abstain from alcohol or drink within safe limits (<=14/week and <=4 drinks/occasion for males, <=7/weeks and <= 3 drinks/occasion for females) and that the risk for alcohol disorders and other health effects rises proportionally with the number of drinks per week and how often a drinker exceeds daily limits.  Discussed cessation/primary prevention of drug use and availability of treatment for abuse.   Diet: Encouraged to adjust caloric intake to maintain  or achieve ideal body weight, to reduce intake of dietary saturated fat and total fat, to limit sodium intake by avoiding high sodium foods and not adding table salt, and to maintain adequate dietary potassium and calcium preferably from fresh fruits, vegetables, and low-fat dairy products.    stressed the importance of regular exercise  Injury prevention: Discussed safety belts, safety helmets, smoke detector, smoking near bedding or upholstery.   Dental health: Discussed importance of regular tooth brushing,  flossing, and dental visits.    NEXT PREVENTATIVE PHYSICAL DUE IN 1 YEAR. Return 1-2 months follow up hot flashes, virtual OK.

## 2019-06-02 LAB — MICROSCOPIC EXAMINATION

## 2019-06-02 LAB — UA/M W/RFLX CULTURE, ROUTINE
Bilirubin, UA: NEGATIVE
Glucose, UA: NEGATIVE
Ketones, UA: NEGATIVE
Leukocytes,UA: NEGATIVE
Nitrite, UA: NEGATIVE
Protein,UA: NEGATIVE
Specific Gravity, UA: >1.03 — ABNORMAL HIGH (ref 1.005–1.030)
Urobilinogen, Ur: 0.2 mg/dL (ref 0.2–1.0)
pH, UA: 5.5 (ref 5.0–7.5)

## 2019-06-02 LAB — CBC WITH DIFFERENTIAL/PLATELET
Basophils Absolute: 0 10*3/uL (ref 0.0–0.2)
Basos: 0 %
EOS (ABSOLUTE): 0.2 10*3/uL (ref 0.0–0.4)
Eos: 2 %
Hematocrit: 39.7 % (ref 34.0–46.6)
Hemoglobin: 13.7 g/dL (ref 11.1–15.9)
Immature Grans (Abs): 0 10*3/uL (ref 0.0–0.1)
Immature Granulocytes: 0 %
Lymphocytes Absolute: 2.7 10*3/uL (ref 0.7–3.1)
Lymphs: 33 %
MCH: 33.7 pg — ABNORMAL HIGH (ref 26.6–33.0)
MCHC: 34.5 g/dL (ref 31.5–35.7)
MCV: 98 fL — ABNORMAL HIGH (ref 79–97)
Monocytes Absolute: 0.5 10*3/uL (ref 0.1–0.9)
Monocytes: 6 %
Neutrophils Absolute: 4.9 10*3/uL (ref 1.4–7.0)
Neutrophils: 59 %
Platelets: 314 10*3/uL (ref 150–450)
RBC: 4.06 x10E6/uL (ref 3.77–5.28)
RDW: 13.3 % (ref 11.7–15.4)
WBC: 8.3 10*3/uL (ref 3.4–10.8)

## 2019-06-02 LAB — LIPID PANEL W/O CHOL/HDL RATIO
Cholesterol, Total: 224 mg/dL — ABNORMAL HIGH (ref 100–199)
HDL: 41 mg/dL (ref >39)
LDL Chol Calc (NIH): 124 mg/dL — ABNORMAL HIGH (ref 0–99)
Triglycerides: 331 mg/dL — ABNORMAL HIGH (ref 0–149)
VLDL Cholesterol Cal: 59 mg/dL — ABNORMAL HIGH (ref 5–40)

## 2019-06-02 LAB — COMPREHENSIVE METABOLIC PANEL
ALT: 39 IU/L — ABNORMAL HIGH (ref 0–32)
AST: 25 IU/L (ref 0–40)
Albumin/Globulin Ratio: 2.6 — ABNORMAL HIGH (ref 1.2–2.2)
Albumin: 4.5 g/dL (ref 3.8–4.9)
Alkaline Phosphatase: 139 IU/L — ABNORMAL HIGH (ref 39–117)
BUN/Creatinine Ratio: 24 — ABNORMAL HIGH (ref 9–23)
BUN: 21 mg/dL (ref 6–24)
Bilirubin Total: <0.2 mg/dL (ref 0.0–1.2)
CO2: 22 mmol/L (ref 20–29)
Calcium: 9.4 mg/dL (ref 8.7–10.2)
Chloride: 103 mmol/L (ref 96–106)
Creatinine, Ser: 0.89 mg/dL (ref 0.57–1.00)
GFR calc Af Amer: 86 mL/min/{1.73_m2} (ref >59)
GFR calc non Af Amer: 75 mL/min/{1.73_m2} (ref >59)
Globulin, Total: 1.7 g/dL (ref 1.5–4.5)
Glucose: 84 mg/dL (ref 65–99)
Potassium: 4.1 mmol/L (ref 3.5–5.2)
Sodium: 139 mmol/L (ref 134–144)
Total Protein: 6.2 g/dL (ref 6.0–8.5)

## 2019-06-02 LAB — TSH: TSH: 1.16 u[IU]/mL (ref 0.450–4.500)

## 2019-06-02 LAB — MICROALBUMIN, URINE WAIVED
Creatinine, Urine Waived: 200 mg/dL (ref 10–300)
Microalb, Ur Waived: 10 mg/L (ref 0–19)
Microalb/Creat Ratio: <30 mg/g (ref <30)

## 2019-06-03 NOTE — Assessment & Plan Note (Signed)
Under good control on current regimen. Continue current regimen. Continue to monitor. Call with any concerns. Refills given. Labs drawn today. Has been cutting down on her Lorrin Mais and trying to take it only as needed.

## 2019-06-03 NOTE — Assessment & Plan Note (Signed)
Under good control on current regimen. Continue current regimen. Continue to monitor. Call with any concerns. Refills given. Labs drawn today.   

## 2019-06-04 ENCOUNTER — Encounter: Payer: Self-pay | Admitting: Family Medicine

## 2019-06-10 MED ORDER — FLUCONAZOLE 150 MG PO TABS: 150.0000 mg | ORAL_TABLET | Freq: Every day | ORAL | 0 refills | Status: DC

## 2019-06-19 ENCOUNTER — Other Ambulatory Visit: Payer: Self-pay | Admitting: Family Medicine

## 2019-06-25 ENCOUNTER — Ambulatory Visit
Admission: RE | Admit: 2019-06-25 | Discharge: 2019-06-25 | Disposition: A | Discharge status: Home / Self Care | Payer: BC Managed Care – PPO | Source: Ambulatory Visit | Attending: Family Medicine | Admitting: Family Medicine

## 2019-06-25 DIAGNOSIS — R928 Other abnormal and inconclusive findings on diagnostic imaging of breast: Secondary | ICD-10-CM

## 2019-07-13 ENCOUNTER — Telehealth: Payer: BC Managed Care – PPO | Admitting: Family Medicine

## 2019-08-22 ENCOUNTER — Other Ambulatory Visit: Payer: Self-pay | Admitting: Family Medicine

## 2019-09-05 ENCOUNTER — Ambulatory Visit: Payer: BC Managed Care – PPO | Admitting: Family Medicine

## 2019-09-13 ENCOUNTER — Ambulatory Visit (INDEPENDENT_AMBULATORY_CARE_PROVIDER_SITE_OTHER): Payer: BC Managed Care – PPO | Admitting: Family Medicine

## 2019-09-13 ENCOUNTER — Encounter: Payer: Self-pay | Admitting: Family Medicine

## 2019-09-13 ENCOUNTER — Other Ambulatory Visit: Payer: Self-pay

## 2019-09-13 VITALS — BP 123/83 | HR 75 | Temp 98.2°F | Wt 153.0 lb

## 2019-09-13 DIAGNOSIS — R1031 Right lower quadrant pain: Secondary | ICD-10-CM | POA: Diagnosis not present

## 2019-09-13 DIAGNOSIS — B3731 Acute candidiasis of vulva and vagina: Secondary | ICD-10-CM

## 2019-09-13 DIAGNOSIS — B373 Candidiasis of vulva and vagina: Secondary | ICD-10-CM

## 2019-09-13 MED ORDER — TAMSULOSIN HCL 0.4 MG PO CAPS: 0.4000 mg | ORAL_CAPSULE | Freq: Every day | ORAL | 0 refills | Status: DC

## 2019-09-13 MED ORDER — FLUCONAZOLE 150 MG PO TABS: 150.0000 mg | ORAL_TABLET | Freq: Once | ORAL | 0 refills | Status: AC

## 2019-09-13 NOTE — Progress Notes (Signed)
BP 123/83   Pulse 75   Temp 98.2 F (36.8 C) (Oral)   Wt 153 lb (69.4 kg)   SpO2 97%   BMI 24.96 kg/m    Subjective:    Patient ID: Morgan Cisneros, female    DOB: [birth year], 54 y.o.   MRN: [id number]  HPI: Morgan Cisneros is a 54 y.o. female  Chief Complaint  Patient presents with  . Abdominal Pain    right side abdominal and pelvic pain x about 3 weeks   About 3 weeks of right lower abdominal pain that has been persistent and unchanged and is described as cramping and burning pain. Sometimes radiating to flank. Worse with laughing and pressure. Did have some dysuria a few times so took some uristat for that. Tylenol not seeming to help much. Does have known kidney stones on the right seen on CT a year or so ago and notes significant hx of frequent kidney stones. Also notes this is the area of her bowel surgery several years ago for diverticulitis and the pain almost feels like it's directly under this healed incision so wondering if that is related. Denies fever, chills, sweats, N/V/D, weight loss, melena, obvious hematuria. Hx of hysterectomy and appendectomy.   Relevant past medical, surgical, family and social history reviewed and updated as indicated. Interim medical history since our last visit reviewed. Allergies and medications reviewed and updated.  Review of Systems  Per HPI unless specifically indicated above     Objective:    BP 123/83   Pulse 75   Temp 98.2 F (36.8 C) (Oral)   Wt 153 lb (69.4 kg)   SpO2 97%   BMI 24.96 kg/m   Wt Readings from Last 3 Encounters:  09/13/19 153 lb (69.4 kg)  06/01/19 152 lb (68.9 kg)  02/09/19 148 lb 9.6 oz (67.4 kg)    Physical Exam Vitals and nursing note reviewed.  Constitutional:      Appearance: Normal appearance. She is not ill-appearing.  HENT:     Head: Atraumatic.  Eyes:     Extraocular Movements: Extraocular movements intact.     Conjunctiva/sclera: Conjunctivae normal.  Cardiovascular:     Rate  and Rhythm: Normal rate and regular rhythm.     Heart sounds: Normal heart sounds.  Pulmonary:     Effort: Pulmonary effort is normal.     Breath sounds: Normal breath sounds.  Abdominal:     General: Bowel sounds are normal. There is no distension.     Palpations: Abdomen is soft.     Tenderness: There is abdominal tenderness (mild ttp right pelvic/ inguinal area). There is right CVA tenderness (minimal). There is no left CVA tenderness, guarding or rebound.  Musculoskeletal:        General: Normal range of motion.     Cervical back: Normal range of motion and neck supple.  Skin:    General: Skin is warm and dry.  Neurological:     Mental Status: She is alert and oriented to person, place, and time.  Psychiatric:        Mood and Affect: Mood normal.        Thought Content: Thought content normal.        Judgment: Judgment normal.     Results for orders placed or performed in visit on 09/13/19  Microscopic Examination   Urine  Result Value Ref Range   WBC, UA 0-5 0 - 5 /hpf   RBC 3-10 (A) 0 - 2 /  hpf   Epithelial Cells (non renal) 0-10 0 - 10 /hpf   Crystal Type WILL FOLLOW    Mucus, UA WILL FOLLOW    Bacteria, UA WILL FOLLOW    Yeast, UA WILL FOLLOW    Trichomonas, UA WILL FOLLOW    Urinalysis Comments WILL FOLLOW   Urine Culture, Reflex   Urine  Result Value Ref Range   Urine Culture, Routine WILL FOLLOW   UA/M w/rflx Culture, Routine   Specimen: Urine   Urine  Result Value Ref Range   Specific Gravity, UA 1.025 1.005 - 1.030   pH, UA 6.0 5.0 - 7.5   Color, UA Yellow Yellow   Appearance Ur Clear Clear   Leukocytes,UA Negative Negative   Protein,UA Negative Negative/Trace   Glucose, UA Negative Negative   Ketones, UA Negative Negative   RBC, UA 1+ (A) Negative   Bilirubin, UA Negative Negative   Urobilinogen, Ur 0.2 0.2 - 1.0 mg/dL   Nitrite, UA Negative Negative   Microscopic Examination See below:    Urinalysis Reflex Comment       Assessment & Plan:    Problem List Items Addressed This Visit    None    Visit Diagnoses    Right lower quadrant abdominal pain    -  Primary   Strong suspicion for kidney stones, trial flomax, push fluids and tylenol prn. Will repeat CT if worsening or not resolving for further evaluation   Relevant Orders   UA/M w/rflx Culture, Routine (Completed)   Microscopic Examination (Completed)   Urine Culture, Reflex (Completed)   Vaginal candidiasis       Noted on U/A with micro - diflucan sent, probiotics reviewed   Relevant Medications   fluconazole (DIFLUCAN) 150 MG tablet     25 minutes spent today in direct patient care and counseling  Follow up plan: Return if symptoms worsen or fail to improve.

## 2019-09-16 LAB — UA/M W/RFLX CULTURE, ROUTINE
Bilirubin, UA: NEGATIVE
Glucose, UA: NEGATIVE
Ketones, UA: NEGATIVE
Leukocytes,UA: NEGATIVE
Nitrite, UA: NEGATIVE
Protein,UA: NEGATIVE
Specific Gravity, UA: 1.025 (ref 1.005–1.030)
Urobilinogen, Ur: 0.2 mg/dL (ref 0.2–1.0)
pH, UA: 6 (ref 5.0–7.5)

## 2019-09-16 LAB — URINE CULTURE, REFLEX

## 2019-09-16 LAB — MICROSCOPIC EXAMINATION

## 2019-09-22 ENCOUNTER — Other Ambulatory Visit: Payer: Self-pay | Admitting: Family Medicine

## 2019-09-22 NOTE — Telephone Encounter (Signed)
Requested Prescriptions  Pending Prescriptions Disp Refills   DULoxetine HCl 40 MG CPEP [Pharmacy Med Name: DULOXETINE DR 40MG  CAPSULES] 90 capsule 0    Sig: TAKE 1 CAPSULE BY MOUTH DAILY     Psychiatry: Antidepressants - SNRI Passed - 09/22/2019  9:11 AM      Passed - Completed PHQ-2 or PHQ-9 in the last 360 days.      Passed - Last BP in normal range    BP Readings from Last 1 Encounters:  09/13/19 123/83         Passed - Valid encounter within last 6 months    Recent Outpatient Visits          1 week ago Right lower quadrant abdominal pain   [facility], [place]   3 months ago Routine general medical examination at a health care facility   [facility], [place], [place]   7 months ago Real, Megan P, DO   10 months ago Essential hypertension   [place], [place], [place]   11 months ago Diarrhea, unspecified type   [facility], [place], [place]

## 2019-10-12 ENCOUNTER — Encounter: Payer: Self-pay | Admitting: Family Medicine

## 2019-10-12 ENCOUNTER — Other Ambulatory Visit: Payer: Self-pay | Admitting: Nurse Practitioner

## 2019-10-12 ENCOUNTER — Other Ambulatory Visit: Payer: Self-pay | Admitting: Family Medicine

## 2019-10-12 MED ORDER — FLUCONAZOLE 150 MG PO TABS: 150.0000 mg | ORAL_TABLET | Freq: Every day | ORAL | 0 refills | Status: DC

## 2019-10-15 ENCOUNTER — Encounter: Payer: Self-pay | Admitting: Family Medicine

## 2019-10-17 ENCOUNTER — Other Ambulatory Visit: Payer: Self-pay

## 2019-10-17 ENCOUNTER — Encounter: Payer: Self-pay | Admitting: Family Medicine

## 2019-10-17 ENCOUNTER — Ambulatory Visit (INDEPENDENT_AMBULATORY_CARE_PROVIDER_SITE_OTHER): Payer: BC Managed Care – PPO | Admitting: Family Medicine

## 2019-10-17 VITALS — BP 120/85 | HR 80 | Temp 98.4°F | Wt 151.0 lb

## 2019-10-17 DIAGNOSIS — B9689 Other specified bacterial agents as the cause of diseases classified elsewhere: Secondary | ICD-10-CM

## 2019-10-17 DIAGNOSIS — N760 Acute vaginitis: Secondary | ICD-10-CM

## 2019-10-17 DIAGNOSIS — N898 Other specified noninflammatory disorders of vagina: Secondary | ICD-10-CM | POA: Diagnosis not present

## 2019-10-17 LAB — WET PREP FOR TRICH, YEAST, CLUE
Clue Cell Exam: POSITIVE — AB
Trichomonas Exam: NEGATIVE
Yeast Exam: NEGATIVE

## 2019-10-17 MED ORDER — FLUCONAZOLE 150 MG PO TABS: 150.0000 mg | ORAL_TABLET | Freq: Every day | ORAL | 0 refills | Status: DC

## 2019-10-17 MED ORDER — METRONIDAZOLE 500 MG PO TABS: 500.0000 mg | ORAL_TABLET | Freq: Two times a day (BID) | ORAL | 0 refills | Status: DC

## 2019-10-17 NOTE — Progress Notes (Signed)
BP 120/85 (BP Location: Left Arm, Patient Position: Sitting, Cuff Size: Normal)   Pulse 80   Temp 98.4 F (36.9 C) (Oral)   Wt 151 lb (68.5 kg)   SpO2 98%   BMI 24.63 kg/m    Subjective:    Patient ID: Morgan Cisneros, female    DOB: 1965/10/09, 54 y.o.   MRN: [id number]  HPI: Morgan Cisneros is a 54 y.o. female  Chief Complaint  Patient presents with  . Vaginitis   VAGINAL DISCHARGE Duration: about a month Discharge description: white  Pruritus: yes Dysuria: yes Malodorous: no Urinary frequency: no Fevers: no Abdominal pain: no  Sexual activity: monogamous History of sexually transmitted diseases: no Recent antibiotic use: no Context: yeast about a month ago  Treatments attempted: antifungal  Relevant past medical, surgical, family and social history reviewed and updated as indicated. Interim medical history since our last visit reviewed. Allergies and medications reviewed and updated.  Review of Systems  Constitutional: Negative.   Respiratory: Negative.   Cardiovascular: Negative.   Gastrointestinal: Negative.   Genitourinary: Positive for vaginal discharge. Negative for decreased urine volume, difficulty urinating, dyspareunia, dysuria, enuresis, flank pain, frequency, genital sores, hematuria, menstrual problem, pelvic pain, urgency, vaginal bleeding and vaginal pain.  Musculoskeletal: Negative.   Psychiatric/Behavioral: Negative.     Per HPI unless specifically indicated above     Objective:    BP 120/85 (BP Location: Left Arm, Patient Position: Sitting, Cuff Size: Normal)   Pulse 80   Temp 98.4 F (36.9 C) (Oral)   Wt 151 lb (68.5 kg)   SpO2 98%   BMI 24.63 kg/m   Wt Readings from Last 3 Encounters:  10/17/19 151 lb (68.5 kg)  09/13/19 153 lb (69.4 kg)  06/01/19 152 lb (68.9 kg)    Physical Exam Vitals and nursing note reviewed.  Constitutional:      General: She is not in acute distress.    Appearance: Normal appearance. She is not  ill-appearing, toxic-appearing or diaphoretic.  HENT:     Head: Normocephalic and atraumatic.     Right Ear: External ear normal.     Left Ear: External ear normal.     Nose: Nose normal.     Mouth/Throat:     Mouth: Mucous membranes are moist.     Pharynx: Oropharynx is clear.  Eyes:     General: No scleral icterus.       Right eye: No discharge.        Left eye: No discharge.     Extraocular Movements: Extraocular movements intact.     Conjunctiva/sclera: Conjunctivae normal.     Pupils: Pupils are equal, round, and reactive to light.  Cardiovascular:     Rate and Rhythm: Normal rate and regular rhythm.     Pulses: Normal pulses.     Heart sounds: Normal heart sounds. No murmur heard.  No friction rub. No gallop.   Pulmonary:     Effort: Pulmonary effort is normal. No respiratory distress.     Breath sounds: Normal breath sounds. No stridor. No wheezing, rhonchi or rales.  Chest:     Chest wall: No tenderness.  Musculoskeletal:        General: Normal range of motion.     Cervical back: Normal range of motion and neck supple.  Skin:    General: Skin is warm and dry.     Capillary Refill: Capillary refill takes less than 2 seconds.     Coloration: Skin is not  jaundiced or pale.     Findings: No bruising, erythema, lesion or rash.  Neurological:     General: No focal deficit present.     Mental Status: She is alert and oriented to person, place, and time. Mental status is at baseline.  Psychiatric:        Mood and Affect: Mood normal.        Behavior: Behavior normal.        Thought Content: Thought content normal.        Judgment: Judgment normal.     Results for orders placed or performed in visit on 09/13/19  Microscopic Examination   Urine  Result Value Ref Range   WBC, UA 0-5 0 - 5 /hpf   RBC 3-10 (A) 0 - 2 /hpf   Epithelial Cells (non renal) 0-10 0 - 10 /hpf   Bacteria, UA Few None seen/Few   Yeast, UA Present None seen  Urine Culture, Reflex   Urine    Result Value Ref Range   Urine Culture, Routine Final report    Organism ID, Bacteria Comment   UA/M w/rflx Culture, Routine   Specimen: Urine   Urine  Result Value Ref Range   Specific Gravity, UA 1.025 1.005 - 1.030   pH, UA 6.0 5.0 - 7.5   Color, UA Yellow Yellow   Appearance Ur Clear Clear   Leukocytes,UA Negative Negative   Protein,UA Negative Negative/Trace   Glucose, UA Negative Negative   Ketones, UA Negative Negative   RBC, UA 1+ (A) Negative   Bilirubin, UA Negative Negative   Urobilinogen, Ur 0.2 0.2 - 1.0 mg/dL   Nitrite, UA Negative Negative   Microscopic Examination See below:    Urinalysis Reflex Comment       Assessment & Plan:   Problem List Items Addressed This Visit    None    Visit Diagnoses    BV (bacterial vaginosis)    -  Primary   Will treat with flagyl. Call with any concerns. Continue to monitor.    Relevant Medications   metroNIDAZOLE (FLAGYL) 500 MG tablet   fluconazole (DIFLUCAN) 150 MG tablet   Vaginal irritation       + clue cells   Relevant Orders   WET PREP FOR TRICH, YEAST, CLUE   UA/M w/rflx Culture, Routine       Follow up plan: Return if symptoms worsen or fail to improve.

## 2019-10-17 NOTE — Patient Instructions (Signed)
Bacterial Vaginosis  Bacterial vaginosis is a vaginal infection that occurs when the normal balance of bacteria in the vagina is disrupted. It results from an overgrowth of certain bacteria. This is the most common vaginal infection among women ages 15-44. Because bacterial vaginosis increases your risk for STIs (sexually transmitted infections), getting treated can help reduce your risk for chlamydia, gonorrhea, herpes, and HIV (human immunodeficiency virus). Treatment is also important for preventing complications in pregnant women, because this condition can cause an early (premature) delivery. What are the causes? This condition is caused by an increase in harmful bacteria that are normally present in small amounts in the vagina. However, the reason that the condition develops is not fully understood. What increases the risk? The following factors may make you more likely to develop this condition:  Having a new sexual partner or multiple sexual partners.  Having unprotected sex.  Douching.  Having an intrauterine device (IUD).  Smoking.  Drug and alcohol abuse.  Taking certain antibiotic medicines.  Being pregnant. You cannot get bacterial vaginosis from toilet seats, bedding, swimming pools, or contact with objects around you. What are the signs or symptoms? Symptoms of this condition include:  Grey or white vaginal discharge. The discharge can also be watery or foamy.  A fish-like odor with discharge, especially after sexual intercourse or during menstruation.  Itching in and around the vagina.  Burning or pain with urination. Some women with bacterial vaginosis have no signs or symptoms. How is this diagnosed? This condition is diagnosed based on:  Your medical history.  A physical exam of the vagina.  Testing a sample of vaginal fluid under a microscope to look for a large amount of bad bacteria or abnormal cells. Your health care provider may use a cotton swab or  a small wooden spatula to collect the sample. How is this treated? This condition is treated with antibiotics. These may be given as a pill, a vaginal cream, or a medicine that is put into the vagina (suppository). If the condition comes back after treatment, a second round of antibiotics may be needed. Follow these instructions at home: Medicines  Take over-the-counter and prescription medicines only as told by your health care provider.  Take or use your antibiotic as told by your health care provider. Do not stop taking or using the antibiotic even if you start to feel better. General instructions  If you have a female sexual partner, tell her that you have a vaginal infection. She should see her health care provider and be treated if she has symptoms. If you have a female sexual partner, he does not need treatment.  During treatment: ? Avoid sexual activity until you finish treatment. ? Do not douche. ? Avoid alcohol as directed by your health care provider. ? Avoid breastfeeding as directed by your health care provider.  Drink enough water and fluids to keep your urine clear or pale yellow.  Keep the area around your vagina and rectum clean. ? Wash the area daily with warm water. ? Wipe yourself from front to back after using the toilet.  Keep all follow-up visits as told by your health care provider. This is important. How is this prevented?  Do not douche.  Wash the outside of your vagina with warm water only.  Use protection when having sex. This includes latex condoms and dental dams.  Limit how many sexual partners you have. To help prevent bacterial vaginosis, it is best to have sex with just one partner (  monogamous).  Make sure you and your sexual partner are tested for STIs.  Wear cotton or cotton-lined underwear.  Avoid wearing tight pants and pantyhose, especially during summer.  Limit the amount of alcohol that you drink.  Do not use any products that contain  nicotine or tobacco, such as cigarettes and e-cigarettes. If you need help quitting, ask your health care provider.  Do not use illegal drugs. Where to find more information  Centers for Disease Control and Prevention: www.cdc.gov/std  American Sexual Health Association (ASHA): www.ashastd.org  U.S. Department of Health and Human Services, Office on Women's Health: www.womenshealth.gov/ or https://www.womenshealth.gov/a-z-topics/bacterial-vaginosis Contact a health care provider if:  Your symptoms do not improve, even after treatment.  You have more discharge or pain when urinating.  You have a fever.  You have pain in your abdomen.  You have pain during sex.  You have vaginal bleeding between periods. Summary  Bacterial vaginosis is a vaginal infection that occurs when the normal balance of bacteria in the vagina is disrupted.  Because bacterial vaginosis increases your risk for STIs (sexually transmitted infections), getting treated can help reduce your risk for chlamydia, gonorrhea, herpes, and HIV (human immunodeficiency virus). Treatment is also important for preventing complications in pregnant women, because the condition can cause an early (premature) delivery.  This condition is treated with antibiotic medicines. These may be given as a pill, a vaginal cream, or a medicine that is put into the vagina (suppository). This information is not intended to replace advice given to you by your health care provider. Make sure you discuss any questions you have with your health care provider. Document Revised: 03/04/2017 Document Reviewed: 12/06/2015 Elsevier Patient Education  2020 [employer]  

## 2019-10-19 LAB — MICROSCOPIC EXAMINATION

## 2019-10-19 LAB — UA/M W/RFLX CULTURE, ROUTINE
Bilirubin, UA: NEGATIVE
Glucose, UA: NEGATIVE
Ketones, UA: NEGATIVE
Leukocytes,UA: NEGATIVE
Nitrite, UA: NEGATIVE
Protein,UA: NEGATIVE
Specific Gravity, UA: 1.025 (ref 1.005–1.030)
Urobilinogen, Ur: 0.2 mg/dL (ref 0.2–1.0)
pH, UA: 5.5 (ref 5.0–7.5)

## 2019-10-19 LAB — URINE CULTURE, REFLEX

## 2019-11-08 ENCOUNTER — Other Ambulatory Visit: Payer: Self-pay | Admitting: Family Medicine

## 2019-11-13 ENCOUNTER — Encounter: Payer: Self-pay | Admitting: Family Medicine

## 2019-11-13 ENCOUNTER — Other Ambulatory Visit: Payer: Self-pay | Admitting: Family Medicine

## 2019-11-13 DIAGNOSIS — R928 Other abnormal and inconclusive findings on diagnostic imaging of breast: Secondary | ICD-10-CM

## 2019-11-13 DIAGNOSIS — N63 Unspecified lump in unspecified breast: Secondary | ICD-10-CM

## 2019-11-13 DIAGNOSIS — Z1231 Encounter for screening mammogram for malignant neoplasm of breast: Secondary | ICD-10-CM

## 2019-11-20 ENCOUNTER — Other Ambulatory Visit: Payer: Self-pay | Admitting: Family Medicine

## 2019-11-26 ENCOUNTER — Encounter: Payer: Self-pay | Admitting: Family Medicine

## 2019-11-27 ENCOUNTER — Encounter: Payer: Self-pay | Admitting: Family Medicine

## 2019-11-27 ENCOUNTER — Telehealth (INDEPENDENT_AMBULATORY_CARE_PROVIDER_SITE_OTHER): Payer: BC Managed Care – PPO | Admitting: Family Medicine

## 2019-11-27 VITALS — BP 116/82 | HR 71 | Wt 152.0 lb

## 2019-11-27 DIAGNOSIS — U071 COVID-19: Secondary | ICD-10-CM

## 2019-11-27 MED ORDER — DULOXETINE HCL 40 MG PO CPEP: 1.0000 | ORAL_CAPSULE | Freq: Every day | ORAL | 0 refills | Status: DC

## 2019-11-27 MED ORDER — METOPROLOL SUCCINATE ER 100 MG PO TB24: 100.0000 mg | ORAL_TABLET | Freq: Every day | ORAL | 0 refills | Status: DC

## 2019-11-27 MED ORDER — ZOLPIDEM TARTRATE 10 MG PO TABS: 10.0000 mg | ORAL_TABLET | Freq: Every evening | ORAL | 0 refills | Status: DC | PRN

## 2019-11-27 MED ORDER — LISINOPRIL 5 MG PO TABS: 5.0000 mg | ORAL_TABLET | Freq: Every day | ORAL | 0 refills | Status: DC

## 2019-11-27 MED ORDER — ALBUTEROL SULFATE HFA 108 (90 BASE) MCG/ACT IN AERS
2.0000 | INHALATION_SPRAY | Freq: Four times a day (QID) | RESPIRATORY_TRACT | 0 refills | Status: DC | PRN
Start: 2019-11-27 — End: 2019-12-27

## 2019-11-27 MED ORDER — HYDROCOD POLST-CPM POLST ER 10-8 MG/5ML PO SUER: 5.0000 mL | Freq: Two times a day (BID) | ORAL | 0 refills | Status: DC | PRN

## 2019-11-27 MED ORDER — BENZONATATE 200 MG PO CAPS
200.0000 mg | ORAL_CAPSULE | Freq: Two times a day (BID) | ORAL | 0 refills | Status: DC | PRN
Start: 2019-11-27 — End: 2020-01-18

## 2019-11-27 NOTE — Progress Notes (Signed)
BP 116/82   Pulse 71   Wt 152 lb (68.9 kg)   BMI 24.80 kg/m    Subjective:    Patient ID: Morgan Cisneros, female    DOB: 1965-04-15, 54 y.o.   MRN: [id number]  HPI: Morgan Cisneros is a 54 y.o. female  Chief Complaint  Patient presents with  . Covid-19  . Cough  . Headache  . chest congestion   COVID Duration: about 5 days, tested positive 2 days ago Worst symptom: headache and cough Fever: no Cough: yes Shortness of breath: no Wheezing: no Chest pain: no Chest tightness: no Chest congestion: no Nasal congestion: yes Runny nose: no Post nasal drip: no Sneezing: no Sore throat: yes Swollen glands: no Sinus pressure: yes Headache: yes Face pain: no Toothache: no Ear pain: no  Ear pressure: no  Eyes red/itching:no Eye drainage/crusting: no  Vomiting: no Rash: no Fatigue: yes Sick contacts: yes Strep contacts: no  Context: stable Recurrent sinusitis: no Relief with OTC cold/cough medications: no  Treatments attempted: cold/sinus   Relevant past medical, surgical, family and social history reviewed and updated as indicated. Interim medical history since our last visit reviewed. Allergies and medications reviewed and updated.  Review of Systems  Constitutional: Positive for chills, diaphoresis and fatigue. Negative for activity change, appetite change, fever and unexpected weight change.  HENT: Positive for congestion, sinus pressure and sore throat. Negative for dental problem, drooling, ear discharge, ear pain, facial swelling, hearing loss, mouth sores, nosebleeds, postnasal drip, rhinorrhea, sinus pain, sneezing, tinnitus, trouble swallowing and voice change.   Eyes: Negative.   Respiratory: Positive for cough. Negative for apnea, choking, chest tightness, shortness of breath, wheezing and stridor.   Cardiovascular: Negative.   Gastrointestinal: Negative.   Musculoskeletal: Positive for myalgias. Negative for arthralgias, back pain, gait problem,  joint swelling, neck pain and neck stiffness.  Psychiatric/Behavioral: Negative.     Per HPI unless specifically indicated above     Objective:    BP 116/82   Pulse 71   Wt 152 lb (68.9 kg)   BMI 24.80 kg/m   Wt Readings from Last 3 Encounters:  11/27/19 152 lb (68.9 kg)  10/17/19 151 lb (68.5 kg)  09/13/19 153 lb (69.4 kg)    Physical Exam Vitals and nursing note reviewed.  Constitutional:      General: She is not in acute distress.    Appearance: Normal appearance. She is not ill-appearing, toxic-appearing or diaphoretic.  HENT:     Head: Normocephalic and atraumatic.     Right Ear: External ear normal.     Left Ear: External ear normal.     Nose: Nose normal.     Mouth/Throat:     Mouth: Mucous membranes are moist.     Pharynx: Oropharynx is clear.  Eyes:     General: No scleral icterus.       Right eye: No discharge.        Left eye: No discharge.     Conjunctiva/sclera: Conjunctivae normal.     Pupils: Pupils are equal, round, and reactive to light.  Pulmonary:     Effort: Pulmonary effort is normal. No respiratory distress.     Comments: Speaking in full sentences Musculoskeletal:        General: Normal range of motion.     Cervical back: Normal range of motion.  Skin:    Coloration: Skin is not jaundiced or pale.     Findings: No bruising, erythema, lesion or rash.  Neurological:     Mental Status: She is alert and oriented to person, place, and time. Mental status is at baseline.  Psychiatric:        Mood and Affect: Mood normal.        Behavior: Behavior normal.        Thought Content: Thought content normal.        Judgment: Judgment normal.     Results for orders placed or performed in visit on 10/17/19  WET PREP FOR [place], YEAST, CLUE   Specimen: Vaginal; Sterile Swab   Sterile Swab  Result Value Ref Range   Trichomonas Exam Negative Negative   Yeast Exam Negative Negative   Clue Cell Exam Positive (A) Negative  Microscopic Examination    Urine  Result Value Ref Range   WBC, UA 6-10 (A) 0 - 5 /hpf   RBC 3-10 (A) 0 - 2 /hpf   Epithelial Cells (non renal) 0-10 0 - 10 /hpf   Bacteria, UA Moderate (A) None seen/Few  Urine Culture, Reflex   Urine  Result Value Ref Range   Urine Culture, Routine Final report    Organism ID, Bacteria Comment   UA/M w/rflx Culture, Routine   Specimen: Urine   Urine  Result Value Ref Range   Specific Gravity, UA 1.025 1.005 - 1.030   pH, UA 5.5 5.0 - 7.5   Color, UA Yellow Yellow   Appearance Ur Clear Clear   Leukocytes,UA Negative Negative   Protein,UA Negative Negative/Trace   Glucose, UA Negative Negative   Ketones, UA Negative Negative   RBC, UA 2+ (A) Negative   Bilirubin, UA Negative Negative   Urobilinogen, Ur 0.2 0.2 - 1.0 mg/dL   Nitrite, UA Negative Negative   Microscopic Examination See below:    Urinalysis Reflex Comment       Assessment & Plan:   Problem List Items Addressed This Visit    None    Visit Diagnoses    COVID-19    -  Primary   Symptomatic care and tussionex and tessalon perles for her cough. Quarantine until 8/30. Call with any concerns. Conitnue to monitor.        Follow up plan: Return if symptoms worsen or fail to improve.   . This visit was completed via MyChart due to the restrictions of the COVID-19 pandemic. All issues as above were discussed and addressed. Physical exam was done as above through visual confirmation on MyChart. If it was felt that the patient should be evaluated in the office, they were directed there. The patient verbally consented to this visit. . Location of the patient: home . Location of the provider: work . Those involved with this call:  . Provider: Park Liter, DO . CMA: Lauretta Grill, RMA . Front Desk/Registration: Don Perking  . Time spent on call: 15 minutes with patient face to face via video conference. More than 50% of this time was spent in counseling and coordination of care. 23 minutes total spent  in review of patient's record and preparation of their chart.

## 2019-11-30 ENCOUNTER — Encounter: Payer: Self-pay | Admitting: Family Medicine

## 2019-11-30 ENCOUNTER — Telehealth (INDEPENDENT_AMBULATORY_CARE_PROVIDER_SITE_OTHER): Payer: BC Managed Care – PPO | Admitting: Family Medicine

## 2019-11-30 VITALS — BP 106/69 | HR 81 | Wt 151.0 lb

## 2019-11-30 DIAGNOSIS — R0602 Shortness of breath: Secondary | ICD-10-CM

## 2019-11-30 DIAGNOSIS — R058 Other specified cough: Secondary | ICD-10-CM

## 2019-11-30 DIAGNOSIS — R05 Cough: Secondary | ICD-10-CM | POA: Diagnosis not present

## 2019-11-30 DIAGNOSIS — U071 COVID-19: Secondary | ICD-10-CM | POA: Diagnosis not present

## 2019-11-30 MED ORDER — AZITHROMYCIN 250 MG PO TABS: ORAL_TABLET | ORAL | 0 refills | Status: DC

## 2019-11-30 NOTE — Progress Notes (Signed)
BP 106/69   Pulse 81   Wt 151 lb (68.5 kg)   BMI 24.63 kg/m    Subjective:    Patient ID: Morgan Cisneros, female    DOB: Apr 22, 1965, 53 y.o.   MRN: [id number]  HPI: Morgan Cisneros is a 54 y.o. female  Chief Complaint  Patient presents with  . Cough    pt states she is still having a perisitant wet cough from the last visit on Tuesday. States the Tussionex and Lavella Lemons are not really helping    . This visit was completed via MyChart due to the restrictions of the COVID-19 pandemic. All issues as above were discussed and addressed. Physical exam was done as above through visual confirmation on MyChart. If it was felt that the patient should be evaluated in the office, they were directed there. The patient verbally consented to this visit. . Location of the patient: home . Location of the provider: work . Those involved with this call:  . Provider: Merrie Roof, PA-C . CMA: Lesle Chris, [place] . Front Desk/Registration: Jill Side  . Time spent on call: 15 minutes with patient face to face via video conference. More than 50% of this time was spent in counseling and coordination of care. 5 minutes total spent in review of patient's record and preparation of their chart. I verified patient identity using two factors (patient name and date of birth). Patient consents verbally to being seen via telemedicine visit today.   Tested positive for COVID a week ago and sxs worsening, husband in the hospital right now with COVID pneumonia and isn't doing well at all. Productive cough that chokes her, wheezing, SOB. Denies fever, chills, CP, SOB. Taking tussionex and mucinex which helps for a short time and also using tessalon perles and albuterol inhaler which she hasn't yet used.   Relevant past medical, surgical, family and social history reviewed and updated as indicated. Interim medical history since our last visit reviewed. Allergies and medications reviewed and updated.  Review of  Systems  Per HPI unless specifically indicated above     Objective:    BP 106/69   Pulse 81   Wt 151 lb (68.5 kg)   BMI 24.63 kg/m   Wt Readings from Last 3 Encounters:  11/30/19 151 lb (68.5 kg)  11/27/19 152 lb (68.9 kg)  10/17/19 151 lb (68.5 kg)    Physical Exam Vitals and nursing note reviewed.  Constitutional:      General: She is not in acute distress.    Appearance: Normal appearance.  HENT:     Head: Atraumatic.     Right Ear: External ear normal.     Left Ear: External ear normal.     Nose: Nose normal. No congestion.     Mouth/Throat:     Mouth: Mucous membranes are moist.     Pharynx: Oropharynx is clear. No posterior oropharyngeal erythema.  Eyes:     Extraocular Movements: Extraocular movements intact.     Conjunctiva/sclera: Conjunctivae normal.  Cardiovascular:     Comments: Unable to assess via virtual visit Pulmonary:     Effort: Pulmonary effort is normal. No respiratory distress.  Musculoskeletal:        General: Normal range of motion.     Cervical back: Normal range of motion.  Skin:    General: Skin is dry.     Findings: No erythema.  Neurological:     Mental Status: She is alert and oriented to person, place, and  time.  Psychiatric:        Mood and Affect: Mood normal.        Thought Content: Thought content normal.        Judgment: Judgment normal.     Results for orders placed or performed in visit on 10/17/19  WET PREP FOR [place], YEAST, CLUE   Specimen: Vaginal; Sterile Swab   Sterile Swab  Result Value Ref Range   Trichomonas Exam Negative Negative   Yeast Exam Negative Negative   Clue Cell Exam Positive (A) Negative  Microscopic Examination   Urine  Result Value Ref Range   WBC, UA 6-10 (A) 0 - 5 /hpf   RBC 3-10 (A) 0 - 2 /hpf   Epithelial Cells (non renal) 0-10 0 - 10 /hpf   Bacteria, UA Moderate (A) None seen/Few  Urine Culture, Reflex   Urine  Result Value Ref Range   Urine Culture, Routine Final report    Organism  ID, Bacteria Comment   UA/M w/rflx Culture, Routine   Specimen: Urine   Urine  Result Value Ref Range   Specific Gravity, UA 1.025 1.005 - 1.030   pH, UA 5.5 5.0 - 7.5   Color, UA Yellow Yellow   Appearance Ur Clear Clear   Leukocytes,UA Negative Negative   Protein,UA Negative Negative/Trace   Glucose, UA Negative Negative   Ketones, UA Negative Negative   RBC, UA 2+ (A) Negative   Bilirubin, UA Negative Negative   Urobilinogen, Ur 0.2 0.2 - 1.0 mg/dL   Nitrite, UA Negative Negative   Microscopic Examination See below:    Urinalysis Reflex Comment       Assessment & Plan:   Problem List Items Addressed This Visit    None    Visit Diagnoses    COVID-19    -  Primary   Concern for developing pneumonia based on worsening course over the last week. Zpak, mucinex, tussionex, rest, fluids. Strict ER precautions given   Relevant Medications   azithromycin (ZITHROMAX) 250 MG tablet   Productive cough       SOB (shortness of breath)           Follow up plan: Return if symptoms worsen or fail to improve.

## 2019-12-06 ENCOUNTER — Other Ambulatory Visit: Payer: BC Managed Care – PPO

## 2019-12-06 ENCOUNTER — Inpatient Hospital Stay: Admission: RE | Admit: 2019-12-06 | Payer: BC Managed Care – PPO | Source: Ambulatory Visit

## 2019-12-11 ENCOUNTER — Encounter: Payer: Self-pay | Admitting: Family Medicine

## 2019-12-13 ENCOUNTER — Other Ambulatory Visit: Payer: Self-pay | Admitting: Family Medicine

## 2019-12-13 MED ORDER — HYDROCOD POLST-CPM POLST ER 10-8 MG/5ML PO SUER
5.0000 mL | Freq: Two times a day (BID) | ORAL | 0 refills | Status: DC | PRN
Start: 2019-12-13 — End: 2020-01-18

## 2019-12-13 NOTE — Telephone Encounter (Signed)
Last virtual visit: 11/30/19

## 2019-12-13 NOTE — Telephone Encounter (Signed)
Copied from [place] [id number]. Topic: Quick Communication - Rx Refill/Question >> Dec 13, 2019 10:56 AM Yvette Rack wrote: Medication: chlorpheniramine-HYDROcodone (TUSSIONEX PENNKINETIC ER) 10-8 MG/5ML SUER  Has the patient contacted their pharmacy? no  Preferred Pharmacy (with phone number or street name): [facility] [place], [address] Phone: [phone] Fax: [phone]  Agent: Please be advised that RX refills may take up to 3 business days. We ask that you follow-up with your pharmacy.

## 2019-12-13 NOTE — Telephone Encounter (Signed)
Please review for refill. Refill not delegated per protocol. Last refill: 11/27/19

## 2019-12-18 ENCOUNTER — Encounter: Payer: Self-pay | Admitting: Family Medicine

## 2019-12-21 ENCOUNTER — Other Ambulatory Visit: Payer: BC Managed Care – PPO

## 2019-12-22 ENCOUNTER — Other Ambulatory Visit: Payer: Self-pay | Admitting: Family Medicine

## 2019-12-22 NOTE — Telephone Encounter (Signed)
Requested  medications are  due for refill today yes  Requested medications are on the active medication list yes  Last refill 8/24  Last visit 11/2019   Notes to clinic Do not see this med/dx addressed in an office visit note, please assess.

## 2019-12-28 ENCOUNTER — Encounter: Payer: Self-pay | Admitting: Family Medicine

## 2019-12-30 ENCOUNTER — Other Ambulatory Visit: Payer: Self-pay | Admitting: Family Medicine

## 2019-12-30 MED ORDER — ZOLPIDEM TARTRATE 10 MG PO TABS: 10.0000 mg | ORAL_TABLET | Freq: Every evening | ORAL | 0 refills | Status: DC | PRN

## 2020-01-10 ENCOUNTER — Other Ambulatory Visit: Payer: Self-pay

## 2020-01-10 ENCOUNTER — Ambulatory Visit
Admission: RE | Admit: 2020-01-10 | Discharge: 2020-01-10 | Disposition: A | Discharge status: Home / Self Care | Payer: BC Managed Care – PPO | Source: Ambulatory Visit | Attending: Family Medicine | Admitting: Family Medicine

## 2020-01-10 DIAGNOSIS — Z1231 Encounter for screening mammogram for malignant neoplasm of breast: Secondary | ICD-10-CM

## 2020-01-10 DIAGNOSIS — N63 Unspecified lump in unspecified breast: Secondary | ICD-10-CM | POA: Diagnosis present

## 2020-01-10 DIAGNOSIS — R928 Other abnormal and inconclusive findings on diagnostic imaging of breast: Secondary | ICD-10-CM | POA: Diagnosis present

## 2020-01-11 ENCOUNTER — Other Ambulatory Visit: Payer: Self-pay | Admitting: Family Medicine

## 2020-01-11 DIAGNOSIS — N6310 Unspecified lump in the right breast, unspecified quadrant: Secondary | ICD-10-CM

## 2020-01-11 DIAGNOSIS — R928 Other abnormal and inconclusive findings on diagnostic imaging of breast: Secondary | ICD-10-CM

## 2020-01-17 ENCOUNTER — Ambulatory Visit
Admission: RE | Admit: 2020-01-17 | Discharge: 2020-01-17 | Disposition: A | Discharge status: Home / Self Care | Payer: BC Managed Care – PPO | Source: Ambulatory Visit | Attending: Family Medicine | Admitting: Family Medicine

## 2020-01-17 ENCOUNTER — Other Ambulatory Visit: Payer: Self-pay

## 2020-01-17 DIAGNOSIS — R928 Other abnormal and inconclusive findings on diagnostic imaging of breast: Secondary | ICD-10-CM | POA: Diagnosis present

## 2020-01-17 DIAGNOSIS — N6310 Unspecified lump in the right breast, unspecified quadrant: Secondary | ICD-10-CM

## 2020-01-17 HISTORY — PX: BREAST BIOPSY: SHX20

## 2020-01-18 ENCOUNTER — Ambulatory Visit: Payer: BC Managed Care – PPO | Admitting: Family Medicine

## 2020-01-18 ENCOUNTER — Encounter: Payer: Self-pay | Admitting: Family Medicine

## 2020-01-18 VITALS — BP 110/74 | HR 82 | Temp 98.1°F | Resp 16 | Wt 148.8 lb

## 2020-01-18 DIAGNOSIS — I10 Essential (primary) hypertension: Secondary | ICD-10-CM

## 2020-01-18 DIAGNOSIS — F339 Major depressive disorder, recurrent, unspecified: Secondary | ICD-10-CM | POA: Diagnosis not present

## 2020-01-18 DIAGNOSIS — G4700 Insomnia, unspecified: Secondary | ICD-10-CM | POA: Diagnosis not present

## 2020-01-18 DIAGNOSIS — Z23 Encounter for immunization: Secondary | ICD-10-CM | POA: Diagnosis not present

## 2020-01-18 DIAGNOSIS — F4321 Adjustment disorder with depressed mood: Secondary | ICD-10-CM | POA: Diagnosis not present

## 2020-01-18 LAB — SURGICAL PATHOLOGY

## 2020-01-18 MED ORDER — METOPROLOL SUCCINATE ER 100 MG PO TB24
100.0000 mg | ORAL_TABLET | Freq: Every day | ORAL | 0 refills | Status: DC
Start: 2020-01-18 — End: 2020-05-20

## 2020-01-18 MED ORDER — DULOXETINE HCL 60 MG PO CPEP: 60.0000 mg | ORAL_CAPSULE | Freq: Every day | ORAL | 3 refills | Status: DC

## 2020-01-18 MED ORDER — LORAZEPAM 0.5 MG PO TABS: 0.5000 mg | ORAL_TABLET | Freq: Two times a day (BID) | ORAL | 1 refills | Status: DC | PRN

## 2020-01-18 NOTE — Progress Notes (Signed)
BP 110/74   Pulse 82   Temp 98.1 F (36.7 C)   Resp 16   Wt 148 lb 12.8 oz (67.5 kg)   BMI 24.27 kg/m    Subjective:    Patient ID: Morgan Cisneros, female    DOB: 1965/10/17, 54 y.o.   MRN: [id number]  HPI: Morgan Cisneros is a 54 y.o. female  Chief Complaint  Patient presents with  . Hypertension    6 month follow up, Under good control on current regimen.  . Anxiety  . Depression  . Insomnia    6 month follow up doing well on Ambien PRN   DEPRESSION- lost her husband to [place] about a month ago. She has been doing OK, but is very sad and not sleeping or feeling like herself.  Mood status: exacerbated Satisfied with current treatment?: no Symptom severity: severe  Duration of current treatment : chronic Side effects: no Medication compliance: excellent compliance Psychotherapy/counseling: no  Depressed mood: yes Anxious mood: yes Anhedonia: no Significant weight loss or gain: no Insomnia: yes hard to fall asleep Fatigue: yes Feelings of worthlessness or guilt: yes Impaired concentration/indecisiveness: yes Suicidal ideations: no Hopelessness: yes Crying spells: yes Depression screen [facility] 2/9 09/13/2019 06/01/2019 11/23/2018 07/14/2018 04/21/2018  Decreased Interest 0 0 0 1 2  Down, Depressed, Hopeless 0 0 0 0 2  PHQ - 2 Score 0 0 0 1 4  Altered sleeping 3 1 1 1 3   Tired, decreased energy 0 1 2 3 3   Change in appetite 0 0 0 0 0  Feeling bad or failure about yourself  0 0 0 0 0  Trouble concentrating 0 1 1 1 3   Moving slowly or fidgety/restless 0 0 0 0 2  Suicidal thoughts 0 0 0 0 0  PHQ-9 Score 3 3 4 6 15   Difficult doing work/chores - Somewhat difficult Somewhat difficult Not difficult at all Somewhat difficult  Some recent data might be hidden   INSOMNIA Duration: chronic Satisfied with sleep quality: no Difficulty falling asleep: yes Difficulty staying asleep: yes Waking a few hours after sleep onset: yes Early morning awakenings: yes Daytime  hypersomnolence: no Wakes feeling refreshed: no Good sleep hygiene: yes Apnea: no Snoring: no Depressed/anxious mood: yes Recent stress: yes Restless legs/nocturnal leg cramps: no Chronic pain/arthritis: no History of sleep study: yes Treatments attempted: melatonin, uinsom, benadryl and ambien   HYPERTENSION Hypertension status: controlled  Satisfied with current treatment? yes Duration of hypertension: chronic BP monitoring frequency:  not checking BP medication side effects:  no Medication compliance: excellent compliance Aspirin: no Recurrent headaches: no Visual changes: no Palpitations: no Dyspnea: no Chest pain: no Lower extremity edema: no Dizzy/lightheaded: no  Relevant past medical, surgical, family and social history reviewed and updated as indicated. Interim medical history since our last visit reviewed. Allergies and medications reviewed and updated.  Review of Systems  Constitutional: Negative.   Respiratory: Negative.   Cardiovascular: Negative.   Gastrointestinal: Negative.   Skin: Negative.   Neurological: Negative.   Psychiatric/Behavioral: Positive for dysphoric mood and sleep disturbance. Negative for agitation, behavioral problems, confusion, decreased concentration, hallucinations, self-injury and suicidal ideas. The patient is nervous/anxious. The patient is not hyperactive.     Per HPI unless specifically indicated above     Objective:    BP 110/74   Pulse 82   Temp 98.1 F (36.7 C)   Resp 16   Wt 148 lb 12.8 oz (67.5 kg)   BMI 24.27 kg/m  Wt Readings from Last 3 Encounters:  01/18/20 148 lb 12.8 oz (67.5 kg)  11/30/19 151 lb (68.5 kg)  11/27/19 152 lb (68.9 kg)    Physical Exam Vitals and nursing note reviewed.  Constitutional:      General: She is not in acute distress.    Appearance: Normal appearance. She is not ill-appearing, toxic-appearing or diaphoretic.  HENT:     Head: Normocephalic and atraumatic.     Right Ear:  External ear normal.     Left Ear: External ear normal.     Nose: Nose normal.     Mouth/Throat:     Mouth: Mucous membranes are moist.     Pharynx: Oropharynx is clear.  Eyes:     General: No scleral icterus.       Right eye: No discharge.        Left eye: No discharge.     Extraocular Movements: Extraocular movements intact.     Conjunctiva/sclera: Conjunctivae normal.     Pupils: Pupils are equal, round, and reactive to light.  Cardiovascular:     Rate and Rhythm: Normal rate and regular rhythm.     Pulses: Normal pulses.     Heart sounds: Normal heart sounds. No murmur heard.  No friction rub. No gallop.   Pulmonary:     Effort: Pulmonary effort is normal. No respiratory distress.     Breath sounds: Normal breath sounds. No stridor. No wheezing, rhonchi or rales.  Chest:     Chest wall: No tenderness.  Musculoskeletal:        General: Normal range of motion.     Cervical back: Normal range of motion and neck supple.  Skin:    General: Skin is warm and dry.     Capillary Refill: Capillary refill takes less than 2 seconds.     Coloration: Skin is not jaundiced or pale.     Findings: No bruising, erythema, lesion or rash.  Neurological:     General: No focal deficit present.     Mental Status: She is alert and oriented to person, place, and time. Mental status is at baseline.  Psychiatric:        Mood and Affect: Mood is depressed. Affect is tearful.        Behavior: Behavior normal.        Thought Content: Thought content normal.        Judgment: Judgment normal.     Results for orders placed or performed during the hospital encounter of 01/17/20  Surgical pathology  Result Value Ref Range   SURGICAL PATHOLOGY      SURGICAL PATHOLOGY CASE: [id number] PATIENT: Nira Conn Surgical Pathology Report     Specimen Submitted: A. Right breast, lower inner; stereo bx  Clinical History: Mass. Stereotactic biopsy. Biopsy clip not left due to vasovagal  reaction.    DIAGNOSIS: A. 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 - NEGATIVE FOR ATYPICAL PROLIFERATIVE BREAST DISEASE.  GROSS DESCRIPTION: A. Labeled: Right breast lower inner Received: in a formalin-filled [place] collection device Specimen radiograph image(s) available for review Time/Date in fixative: 10:18 AM on 01/17/2020 Cold ischemic time: 6 minutes Total fixation time: 6-1/2 hours Core pieces: Multiple Measurement: 1.5 x 1 x 0.5 cm Description / comments: Aggregate of pale yellow fibrofatty tissue fragments. Inked: Black Entirely submitted in cassette(s): 1-section A-B 2-section B-C   Final Diagnosis  performed by Allena Napoleon, MD.   Electronically  signed 01/18/2020 9:24:26AM The electronic signature indicates that the named Attending Pathologist has evaluated the specimen Technical component performed at [facility], [address], [place], [place] [address] Lab: [phone] Dir: Rush Farmer, MD, MMM  Professional component performed at [facility], [facility], [place], [place], [place] [address] Lab: [phone] Dir: Dellia Nims. Reuel Derby, MD       Assessment & Plan:   Problem List Items Addressed This Visit      Cardiovascular and Mediastinum   Essential hypertension    Under good control on current regimen. Continue current regimen. Continue to monitor. Call with any concerns. Refills given.       Relevant Medications   metoprolol succinate (TOPROL-XL) 100 MG 24 hr tablet     Other   Insomnia    Ambien not helping her right now. Will change to lorazepam so she can take it 2x a night as needed. Continue to monitor. Call with any concerns.       Depression, recurrent ([place])    Significantly increased with the loss of her husband. Will get CCM involved for grief counseling. Will increase her cymbalta to 60mg  and treat anxiety  PRN with lorazepam. Continue to monitor closely. Call with any concerns.       Relevant Medications   LORazepam (ATIVAN) 0.5 MG tablet   DULoxetine (CYMBALTA) 60 MG capsule    Other Visit Diagnoses    Grief    -  Primary   New and very fresh. See discussion under depression.    Relevant Orders   Referral to Chronic Care Management Services       Follow up plan: Return in about 4 weeks (around 02/15/2020).

## 2020-01-18 NOTE — Progress Notes (Deleted)
Established patient visit   Patient: Morgan Cisneros   DOB: 05-10-1965   54 y.o. Female  MRN: [id number] Visit Date: 01/18/2020  Today's healthcare provider: Park Liter, DO   No chief complaint on file.  Subjective    HPI  Hypertension, follow-up  BP Readings from Last 3 Encounters:  11/30/19 106/69  11/27/19 116/82  10/17/19 120/85   Wt Readings from Last 3 Encounters:  11/30/19 151 lb (68.5 kg)  11/27/19 152 lb (68.9 kg)  10/17/19 151 lb (68.5 kg)     She was last seen for hypertension 6 months ago.  BP at that visit was 119/78. Management since that visit includes none, under good control on regimen.  She reports {excellent/good/fair/poor:19665} compliance with treatment. She {is/is not:9024} having side effects. {document side effects if present:1} She is following a {diet:21022986} diet. She {is/is not:9024} exercising. She {does/does not:200015} smoke.  Use of agents associated with hypertension: {bp agents assoc with hypertension:511::"none"}.   Outside blood pressures are {***enter patient reported home BP readings, or 'not being checked':1}. Symptoms: {Yes/No:20286} chest pain {Yes/No:20286} chest pressure  {Yes/No:20286} palpitations {Yes/No:20286} syncope  {Yes/No:20286} dyspnea {Yes/No:20286} orthopnea  {Yes/No:20286} paroxysmal nocturnal dyspnea {Yes/No:20286} lower extremity edema   Pertinent labs: Lab Results  Component Value Date   CHOL 224 (H) 06/01/2019   HDL 41 06/01/2019   LDLCALC 124 (H) 06/01/2019   TRIG 331 (H) 06/01/2019   Lab Results  Component Value Date   NA 139 06/01/2019   K 4.1 06/01/2019   CREATININE 0.89 06/01/2019   GFRNONAA 75 06/01/2019   GFRAA 86 06/01/2019   GLUCOSE 84 06/01/2019     The 10-year ASCVD risk score Mikey Bussing DC Jr., et al., 2013) is: 5.8%   --------------------------------------------------------------------------------------------------- Follow up for insomnia  The patient was last seen for  this 6 months ago. Changes made at last visit include ***.  She reports {excellent/good/fair/poor:19665} compliance with treatment. She feels that condition is {improved/worse/unchanged:3041574}. She {is/is not:21021397} having side effects. ***  -----------------------------------------------------------------------------------------  Anxiety, Follow-up  She was last seen for anxiety {NUMBERS 1-12:18279} {days/wks/mos/yrs:310907} ago. Changes made at last visit include ***.   She reports {excellent/good/fair/poor:19665} compliance with treatment. She reports {good/fair/poor:18685} tolerance of treatment. She {is/is not:21021397} having side effects. {document side effects if present:1}  She feels her anxiety is {Desc; severity:60313} and {improved/worse/unchanged:3041574} since last visit.  Symptoms: {Yes/No:20286} chest pain {Yes/No:20286} difficulty concentrating  {Yes/No:20286} dizziness {Yes/No:20286} fatigue  {Yes/No:20286} feelings of losing control {Yes/No:20286} insomnia  {Yes/No:20286} irritable {Yes/No:20286} palpitations  {Yes/No:20286} panic attacks {Yes/No:20286} racing thoughts  {Yes/No:20286} shortness of breath {Yes/No:20286} sweating  {Yes/No:20286} tremors/shakes    GAD-7 Results GAD-7 Generalized Anxiety Disorder Screening Tool 11/23/2018 07/14/2018 04/21/2018  1. Feeling Nervous, Anxious, or on Edge 1 3 2   2. Not Being Able to Stop or Control Worrying 1 3 3   3. Worrying Too Much About Different Things 1 3 3   4. Trouble Relaxing 1 3 3   5. Being So Restless it's Hard To Sit Still 0 0 2  6. Becoming Easily Annoyed or Irritable 0 0 0  7. Feeling Afraid As If Something Awful Might Happen 1 0 2  Total GAD-7 Score 5 12 15   Difficulty At Work, Home, or Getting  Along With Others? Not difficult at all Not difficult at all Not difficult at all    PHQ-9 Scores PHQ9 SCORE ONLY 09/13/2019 06/01/2019 11/23/2018  PHQ-9 Total Score 3 3 4      ---------------------------------------------------------------------------------------------------  {Show patient history (optional):23778::" "}  Medications: Outpatient Medications Prior to Visit  Medication Sig  . albuterol (VENTOLIN HFA) 108 (90 Base) MCG/ACT inhaler INHALE 2 PUFFS INTO THE LUNGS EVERY 6 HOURS AS NEEDED FOR WHEEZING OR SHORTNESS OF BREATH  . azithromycin (ZITHROMAX) 250 MG tablet Take 2 tabs day one, then 1 tab daily until complete  . benzonatate (TESSALON) 200 MG capsule Take 1 capsule (200 mg total) by mouth 2 (two) times daily as needed for cough.  . chlorpheniramine-HYDROcodone (TUSSIONEX PENNKINETIC ER) 10-8 MG/5ML SUER Take 5 mLs by mouth every 12 (twelve) hours as needed.  . DULoxetine HCl 40 MG CPEP Take 1 capsule by mouth daily.  Marland Kitchen lisinopril (ZESTRIL) 5 MG tablet Take 1 tablet (5 mg total) by mouth daily.  . metoprolol succinate (TOPROL-XL) 100 MG 24 hr tablet Take 1 tablet (100 mg total) by mouth daily.  Marland Kitchen zolpidem (AMBIEN) 10 MG tablet Take 1 tablet (10 mg total) by mouth at bedtime as needed for sleep.   No facility-administered medications prior to visit.    Review of Systems  {Heme  Chem  Endocrine  Serology  Results Review (optional):23779::" "}  Objective    There were no vitals taken for this visit. {Show previous vital signs (optional):23777::" "}  Physical Exam  ***  No results found for any visits on 01/18/20.  Assessment & Plan     ***  No follow-ups on file.      {provider attestation***:1}   Park Liter, [place] [phone] (phone) [phone] (fax)  [place]

## 2020-01-20 ENCOUNTER — Encounter: Payer: Self-pay | Admitting: Family Medicine

## 2020-01-20 NOTE — Assessment & Plan Note (Signed)
Significantly increased with the loss of her husband. Will get CCM involved for grief counseling. Will increase her cymbalta to 60mg  and treat anxiety PRN with lorazepam. Continue to monitor closely. Call with any concerns.

## 2020-01-20 NOTE — Assessment & Plan Note (Signed)
Ambien not helping her right now. Will change to lorazepam so she can take it 2x a night as needed. Continue to monitor. Call with any concerns.

## 2020-01-20 NOTE — Assessment & Plan Note (Signed)
Under good control on current regimen. Continue current regimen. Continue to monitor. Call with any concerns. Refills given.   

## 2020-01-21 ENCOUNTER — Telehealth: Payer: Self-pay

## 2020-01-21 NOTE — Chronic Care Management (AMB) (Signed)
  Care Management   Note  01/21/2020 Name: Morgan Cisneros MRN: [id number] DOB: 1965/06/15  Morgan Cisneros is a 54 y.o. year old female who is a primary care patient of Valerie Roys, DO. I reached out to [name] by phone today in response to a referral sent by Morgan Cisneros's PCP Dr. Wynetta Emery .    Morgan Cisneros was given information about care management services today including:  1. Care management services include personalized support from designated clinical staff supervised by her physician, including individualized plan of care and coordination with other care providers 2. 24/7 contact phone numbers for assistance for urgent and routine care needs. 3. The patient may stop care management services at any time by phone call to the office staff.  Patient agreed to services and verbal consent obtained.   Follow up plan: Telephone appointment with care management team member scheduled for:02/13/2020  Morgan Cisneros, [place], [place], [place] [address] Direct Dial: [phone] [email or web address]: [email or web address]

## 2020-01-24 ENCOUNTER — Other Ambulatory Visit: Payer: Self-pay | Admitting: Family Medicine

## 2020-01-24 DIAGNOSIS — N6310 Unspecified lump in the right breast, unspecified quadrant: Secondary | ICD-10-CM

## 2020-01-24 DIAGNOSIS — R928 Other abnormal and inconclusive findings on diagnostic imaging of breast: Secondary | ICD-10-CM

## 2020-01-31 ENCOUNTER — Telehealth (INDEPENDENT_AMBULATORY_CARE_PROVIDER_SITE_OTHER): Payer: BC Managed Care – PPO | Admitting: Family Medicine

## 2020-01-31 ENCOUNTER — Encounter: Payer: Self-pay | Admitting: Family Medicine

## 2020-01-31 ENCOUNTER — Other Ambulatory Visit: Payer: Self-pay

## 2020-01-31 VITALS — Ht 65.0 in | Wt 148.0 lb

## 2020-01-31 DIAGNOSIS — F4321 Adjustment disorder with depressed mood: Secondary | ICD-10-CM | POA: Diagnosis not present

## 2020-01-31 MED ORDER — LORAZEPAM 1 MG PO TABS: 0.5000 mg | ORAL_TABLET | Freq: Two times a day (BID) | ORAL | 1 refills | Status: DC | PRN

## 2020-01-31 NOTE — Progress Notes (Signed)
Ht 5\' 5"  (1.651 m)   Wt 148 lb (67.1 kg)   BMI 24.63 kg/m    Subjective:    Patient ID: Morgan Cisneros, female    DOB: Apr 30, 1965, 54 y.o.   MRN: [id number]  HPI: Morgan Cisneros is a 54 y.o. female  Chief Complaint  Patient presents with  . grief   DEPRESSION- has been getting some sleep. Has been taking 2-3 of the lorazepam to be able to sleep.  Mood status: uncontrolled Satisfied with current treatment?: yes Symptom severity: severe  Duration of current treatment : chronic Side effects: no Medication compliance: excellent compliance Psychotherapy/counseling: no  Depressed mood: yes Anxious mood: yes Anhedonia: no Significant weight loss or gain: no Insomnia: yes hard to fall asleep Fatigue: yes Feelings of worthlessness or guilt: yes Impaired concentration/indecisiveness: no Suicidal ideations: no Hopelessness: no Crying spells: yes Depression screen [facility] 2/9 01/31/2020 09/13/2019 06/01/2019 11/23/2018 07/14/2018  Decreased Interest 3 0 0 0 1  Down, Depressed, Hopeless 3 0 0 0 0  PHQ - 2 Score 6 0 0 0 1  Altered sleeping 3 3 1 1 1   Tired, decreased energy 3 0 1 2 3   Change in appetite 3 0 0 0 0  Feeling bad or failure about yourself  3 0 0 0 0  Trouble concentrating 3 0 1 1 1   Moving slowly or fidgety/restless 0 0 0 0 0  Suicidal thoughts 0 0 0 0 0  PHQ-9 Score 21 3 3 4 6   Difficult doing work/chores Very difficult - Somewhat difficult Somewhat difficult Not difficult at all  Some recent data might be hidden    Relevant past medical, surgical, family and social history reviewed and updated as indicated. Interim medical history since our last visit reviewed. Allergies and medications reviewed and updated.  Review of Systems  Constitutional: Negative.   Respiratory: Negative.   Cardiovascular: Negative.   Gastrointestinal: Negative.   Musculoskeletal: Negative.   Psychiatric/Behavioral: Positive for dysphoric mood and sleep disturbance. Negative for  agitation, behavioral problems, confusion, decreased concentration, hallucinations, self-injury and suicidal ideas. The patient is nervous/anxious. The patient is not hyperactive.     Per HPI unless specifically indicated above     Objective:    Ht 5\' 5"  (1.651 m)   Wt 148 lb (67.1 kg)   BMI 24.63 kg/m   Wt Readings from Last 3 Encounters:  01/31/20 148 lb (67.1 kg)  01/18/20 148 lb 12.8 oz (67.5 kg)  11/30/19 151 lb (68.5 kg)    Physical Exam Vitals and nursing note reviewed.  Constitutional:      General: She is not in acute distress.    Appearance: Normal appearance. She is not ill-appearing, toxic-appearing or diaphoretic.  HENT:     Head: Normocephalic and atraumatic.     Right Ear: External ear normal.     Left Ear: External ear normal.     Nose: Nose normal.     Mouth/Throat:     Mouth: Mucous membranes are moist.     Pharynx: Oropharynx is clear.  Eyes:     General: No scleral icterus.       Right eye: No discharge.        Left eye: No discharge.     Conjunctiva/sclera: Conjunctivae normal.     Pupils: Pupils are equal, round, and reactive to light.  Pulmonary:     Effort: Pulmonary effort is normal. No respiratory distress.     Comments: Speaking in full sentences Musculoskeletal:  General: Normal range of motion.     Cervical back: Normal range of motion.  Skin:    Coloration: Skin is not jaundiced or pale.     Findings: No bruising, erythema, lesion or rash.  Neurological:     Mental Status: She is alert and oriented to person, place, and time. Mental status is at baseline.  Psychiatric:        Mood and Affect: Mood normal.        Behavior: Behavior normal.        Thought Content: Thought content normal.        Judgment: Judgment normal.     Results for orders placed or performed during the hospital encounter of 01/17/20  Surgical pathology  Result Value Ref Range   SURGICAL PATHOLOGY      SURGICAL PATHOLOGY CASE: [id number] PATIENT:  Morgan Cisneros Surgical Pathology Report     Specimen Submitted: A. Right breast, lower inner; stereo bx  Clinical History: Mass. Stereotactic biopsy. Biopsy clip not left due to vasovagal reaction.    DIAGNOSIS: A. 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 - NEGATIVE FOR ATYPICAL PROLIFERATIVE BREAST DISEASE.  GROSS DESCRIPTION: A. Labeled: Right breast lower inner Received: in a formalin-filled [place] collection device Specimen radiograph image(s) available for review Time/Date in fixative: 10:18 AM on 01/17/2020 Cold ischemic time: 6 minutes Total fixation time: 6-1/2 hours Core pieces: Multiple Measurement: 1.5 x 1 x 0.5 cm Description / comments: Aggregate of pale yellow fibrofatty tissue fragments. Inked: Black Entirely submitted in cassette(s): 1-section A-B 2-section B-C   Final Diagnosis  performed by Allena Napoleon, MD.   Electronically signed 01/18/2020 9:24:26AM The electronic signature indicates that the named Attending Pathologist has evaluated the specimen Technical component performed at [facility], [address] [place], [place] [address] Lab: [phone] Dir: Rush Farmer, MD, MMM  Professional component performed at [facility], [facility], [place], [place], [place] [address] Lab: [phone] Dir: Dellia Nims. Reuel Derby, MD       Assessment & Plan:   Problem List Items Addressed This Visit    None    Visit Diagnoses    Grief    -  Primary   Still struggling. Seeing LCSW on 11/10. Will increase her lorazepam for sleep- try to stay at 1mg , but occasionally OK for 1.5qHS. Continue to monitor closely       Follow up plan: Return in about 2 weeks (around 02/14/2020).   . This visit was completed via MyChart due to the restrictions of the COVID-19 pandemic. All issues as above were discussed and addressed. Physical exam was  done as above through visual confirmation on MyChart. If it was felt that the patient should be evaluated in the office, they were directed there. The patient verbally consented to this visit. . Location of the patient: home . Location of the provider: work . Those involved with this call:  . Provider: Park Liter, DO . CMA: Flonnie Overman, CMA . Front Desk/Registration: Jill Side  . Time spent on call: 15 minutes with patient face to face via video conference. More than 50% of this time was spent in counseling and coordination of care. 23 minutes total spent in review of patient's record and preparation of their chart.

## 2020-02-01 ENCOUNTER — Ambulatory Visit: Payer: BC Managed Care – PPO | Admitting: Family Medicine

## 2020-02-01 ENCOUNTER — Telehealth: Payer: Self-pay

## 2020-02-01 NOTE — Telephone Encounter (Signed)
unable to lvm to confirm apt

## 2020-02-01 NOTE — Telephone Encounter (Signed)
-----   Message from Valerie Roys, DO sent at 01/31/2020  4:18 PM EDT ----- 2-3 weeks virtual OK patient's choice if she wants to come in in person

## 2020-02-07 ENCOUNTER — Ambulatory Visit
Admission: RE | Admit: 2020-02-07 | Discharge: 2020-02-07 | Disposition: A | Discharge status: Home / Self Care | Payer: BC Managed Care – PPO | Source: Ambulatory Visit | Attending: Family Medicine | Admitting: Family Medicine

## 2020-02-07 ENCOUNTER — Other Ambulatory Visit: Payer: Self-pay

## 2020-02-07 DIAGNOSIS — R928 Other abnormal and inconclusive findings on diagnostic imaging of breast: Secondary | ICD-10-CM | POA: Insufficient documentation

## 2020-02-07 DIAGNOSIS — N6310 Unspecified lump in the right breast, unspecified quadrant: Secondary | ICD-10-CM

## 2020-02-07 HISTORY — PX: BREAST BIOPSY: SHX20

## 2020-02-08 LAB — SURGICAL PATHOLOGY

## 2020-02-13 ENCOUNTER — Ambulatory Visit: Payer: BC Managed Care – PPO | Admitting: Licensed Clinical Social Worker

## 2020-02-13 DIAGNOSIS — G4700 Insomnia, unspecified: Secondary | ICD-10-CM

## 2020-02-13 DIAGNOSIS — F419 Anxiety disorder, unspecified: Secondary | ICD-10-CM

## 2020-02-13 DIAGNOSIS — F339 Major depressive disorder, recurrent, unspecified: Secondary | ICD-10-CM

## 2020-02-13 DIAGNOSIS — F4321 Adjustment disorder with depressed mood: Secondary | ICD-10-CM

## 2020-02-13 DIAGNOSIS — R0602 Shortness of breath: Secondary | ICD-10-CM

## 2020-02-13 DIAGNOSIS — I10 Essential (primary) hypertension: Secondary | ICD-10-CM

## 2020-02-13 NOTE — Chronic Care Management (AMB) (Signed)
Chronic Care Management    Clinical Social Work Follow Up Note  02/13/2020 Name: Morgan Cisneros MRN: [id number] DOB: 1965/07/14  Morgan Cisneros is a 54 y.o. year old female who is a primary care patient of Valerie Roys, DO. The CCM team was consulted for assistance with Grief Counseling.   Review of patient status, including review of consultants reports, other relevant assessments, and collaboration with appropriate care team members and the patient's provider was performed as part of comprehensive patient evaluation and provision of chronic care management services.    SDOH (Social Determinants of Health) assessments performed: Yes    Outpatient Encounter Medications as of 02/13/2020  Medication Sig  . DULoxetine (CYMBALTA) 60 MG capsule Take 1 capsule (60 mg total) by mouth daily.  Marland Kitchen LORazepam (ATIVAN) 1 MG tablet Take 0.5-1.5 tablets (0.5-1.5 mg total) by mouth 2 (two) times daily as needed for anxiety or sleep.  . metoprolol succinate (TOPROL-XL) 100 MG 24 hr tablet Take 1 tablet (100 mg total) by mouth daily.   No facility-administered encounter medications on file as of 02/13/2020.     Goals Addressed    . SW-Find Help in My Community       Follow Up Date-90 days from 02/13/20   - begin a notebook of services in my neighborhood or community - call 211 when I need some help - follow-up on any referrals for help I am given - think ahead to make sure my need does not become an emergency - make a note about what I need to have by the phone or take with me, like an identification card or social security number have a back-up plan - have a back-up plan - make a list of family or friends that I can call    Why is this important?   Knowing how and where to find help for yourself or family in your neighborhood and community is an important skill.  You will want to take some steps to learn how.    Notes: Patient was educated on available grief support and long term  therapist resources within her area. Email was sent to her on 02/13/20 with this information as well.  LCSW completed referral to [facility] on 02/13/20 for grief therapy due to losing souse.     Marland Kitchen SW-Manage My Emotions       Follow Up Date- 90 days from 02/13/20   - begin personal counseling - call and visit an old friend - check out volunteer opportunities - join a support group - laugh; watch a funny movie or comedian - learn and use visualization or guided imagery - perform a random act of kindness - practice relaxation or meditation daily - start or continue a personal journal - talk about feelings with a friend, family or spiritual advisor - practice positive thinking and self-talk    Why is this important?   When you are stressed, down or upset, your body reacts too.  For example, your blood pressure may get higher; you may have a headache or stomachache.  When your emotions get the best of you, your body's ability to fight off cold and flu gets weak.  These steps will help you manage your emotions.     Notes: Patient lost her spouse of 43 years unexpectedly from [place]. She is having a difficult time processing her complex grief. LCSW provided grief support and interventions during today's session. Patient shares that her job is her only outlet. LCSW educated  patient on healthy ways to cope with ongoing grief symptoms. Patient admits that she cries herself to sleep every night. Patient shares that PCP adjusted her medication which helped some with her insomnia. Referral was placed for grief therapy with [facility] on 02/13/20 per patient's permission.   10 LITTLE Things To Do When You're Feeling Too Down To Do Anything  Take a shower. Even if you plan to stay in all day long and not see a soul, take a shower. It takes the most effort to hop in to the shower but once you do, you'll feel immediate results. It will wake you up and you'll be feeling much fresher (and cleaner  too).  Brush and floss your teeth. Give your teeth a good brushing with a floss finish. It's a small task but it feels so good and you can check 'taking care of your health' off the list of things to do.  Do something small on your list. Most of [place] have some small thing we would like to get done (load of laundry, sew a button, email a friend). Doing one of these things will make you feel like you've accomplished something.  Drink water. Drinking water is easy right? It's also really beneficial for your health so keep a glass beside you all day and take sips often. It gives you energy and prevents you from boredom eating.  Do some floor exercises. The last thing you want to do is exercise but it might be just the thing you need the most. Keep it simple and do exercises that involve sitting or laying on the floor. Even the smallest of exercises release chemicals in the brain that make you feel good. Yoga stretches or core exercises are going to make you feel good with minimal effort.  Make your bed. Making your bed takes a few minutes but it's productive and you'll feel relieved when it's done. An unmade bed is a huge visual reminder that you're having an unproductive day. Do it and consider it your housework for the day.  Put on some nice clothes. Take the sweatpants off even if you don't plan to go anywhere. Put on clothes that make you feel good. Take a look in the mirror so your brain recognizes the sweatpants have been replaced with clothes that make you look great. It's an instant confidence booster.  Wash the dishes. A pile of dirty dishes in the sink is a reflection of your mood. It's possible that if you wash up the dishes, your mood will follow suit. It's worth a try.  Cook a real meal. If you have the luxury to have a "do nothing" day, you have time to make a real meal for yourself. Make a meal that you love to eat. The process is good to get you out of the funk and the food will  ensure you have more energy for tomorrow.  Write out your thoughts by hand. When you hand write, you stimulate your brain to focus on the moment that you're in so make yourself comfortable and write whatever comes into your mind. Put those thoughts out on paper so they stop spinning around in your head. Those thoughts might be the very thing holding you down.  Managing Loss, Adult People experience loss in many different ways throughout their lives. Events such as moving, changing jobs, and losing friends can create a sense of loss. The loss may be as serious as a major health change, divorce, death of a  pet, or death of a loved one. All of these types of loss are likely to create a physical and emotional reaction known as grief. Grief is the result of a major change or an absence of something or someone that you count on. Grief is a normal reaction to loss. A variety of factors can affect your grieving experience, including:  The nature of your loss.  Your relationship to what or whom you lost.  Your understanding of grief and how to manage it.  Your support system. How to manage lifestyle changes Keep to your normal routine as much as possible.  If you have trouble focusing or doing normal activities, it is acceptable to take some time away from your normal routine.  Spend time with friends and loved ones.  Eat a healthy diet, get plenty of sleep, and rest when you feel tired. How to recognize changes  The way that you deal with your grief will affect your ability to function as you normally do. When grieving, you may experience these changes:  Numbness, shock, sadness, anxiety, anger, denial, and guilt.  Thoughts about death.  Unexpected crying.  A physical sensation of emptiness in your stomach.  Problems sleeping and eating.  Tiredness (fatigue).  Loss of interest in normal activities.  Dreaming about or imagining seeing the person who died.  A need to remember what or  whom you lost.  Difficulty thinking about anything other than your loss for a period of time.  Relief. If you have been expecting the loss for a while, you may feel a sense of relief when it happens. Follow these instructions at home:    Activity Express your feelings in healthy ways, such as:  Talking with others about your loss. It may be helpful to find others who have had a similar loss, such as a support group.  Writing down your feelings in a journal.  Doing physical activities to release stress and emotional energy.  Doing creative activities like painting, sculpting, or playing or listening to music.  Practicing resilience. This is the ability to recover and adjust after facing challenges. Reading some resources that encourage resilience may help you to learn ways to practice those behaviors. General instructions 1. Be patient with yourself and others. Allow the grieving process to happen, and remember that grieving takes time. ? It is likely that you may never feel completely done with some grief. You may find a way to move on while still cherishing memories and feelings about your loss. ? Accepting your loss is a process. It can take months or longer to adjust. 2. Keep all follow-up visits as told by your health care provider. This is important. Where to find support To get support for managing loss:  Ask your health care provider for help and recommendations, such as grief counseling or therapy.  Think about joining a support group for people who are managing a loss. Where to find more information You can find more information about managing loss from:  American Society of Clinical Oncology: www.cancer.net  American Psychological Association: TVStereos.ch Contact a health care provider if:  Your grief is extreme and keeps getting worse.  You have ongoing grief that does not improve.  Your body shows symptoms of grief, such as illness.  You feel depressed,  anxious, or lonely. Get help right away if:  You have thoughts about hurting yourself or others. If you ever feel like you may hurt yourself or others, or have thoughts about taking your  own life, get help right away. You can go to your nearest emergency department or call:  Your local emergency services (911 in the [place]  A suicide crisis helpline, such as the [facility] at (832) 811-8672. This is open 24 hours a day. Summary  Grief is the result of a major change or an absence of someone or something that you count on. Grief is a normal reaction to loss.  The depth of grief and the period of recovery depend on the type of loss and your ability to adjust to the change and process your feelings.  Processing grief requires patience and a willingness to accept your feelings and talk about your loss with people who are supportive.  It is important to find resources that work for you and to realize that people experience grief differently. There is not one grieving process that works for everyone in the same way.  Be aware that when grief becomes extreme, it can lead to more severe issues like isolation, depression, anxiety, or suicidal thoughts. Talk with your health care provider if you have any of these issues. This information is not intended to replace advice given to you by your health care provider. Make sure you discuss any questions you have with your health care provider. Document Revised: 05/26/2018 Document Reviewed: 08/05/2016 Elsevier Patient Education  [place] for Managing Grief  When you are experiencing grief, many resources and support are available to help you. You are not alone.   Grief Share (https://www.https://jacobson-moore.net/):   [employer] [facility] [place], [place]  (201) 010-6847 [address] [place], [place], [place], [place]  [facility] [place] [place], [place]  [facility] [address] [place], [place]  [place], [place]  If a Hospice or [facility] team has been involved in the care of your loved one, you may reach out to your contact there or locally, you may reach out to [facility]:   [facility] and [place] [facility]  [place], [place] [address] [phone]    Follow Up Plan: SW will follow up with patient by phone over the next 60 days  Eula Fried, BSW, MSW, [email or web address] Phone: [phone]

## 2020-02-16 ENCOUNTER — Other Ambulatory Visit: Payer: Self-pay | Admitting: Family Medicine

## 2020-02-21 ENCOUNTER — Encounter: Payer: Self-pay | Admitting: Family Medicine

## 2020-02-21 ENCOUNTER — Telehealth (INDEPENDENT_AMBULATORY_CARE_PROVIDER_SITE_OTHER): Payer: BC Managed Care – PPO | Admitting: Family Medicine

## 2020-02-21 VITALS — BP 137/97 | HR 128

## 2020-02-21 DIAGNOSIS — R000 Tachycardia, unspecified: Secondary | ICD-10-CM

## 2020-02-21 DIAGNOSIS — F4321 Adjustment disorder with depressed mood: Secondary | ICD-10-CM

## 2020-02-21 MED ORDER — DULOXETINE HCL 40 MG PO CPEP: 40.0000 mg | ORAL_CAPSULE | Freq: Every day | ORAL | 1 refills | Status: DC

## 2020-02-21 NOTE — Progress Notes (Signed)
BP (!) 137/97   Pulse (!) 128    Subjective:    Patient ID: Morgan Cisneros, female    DOB: 1965/05/11, 54 y.o.   MRN: [id number]  HPI: Morgan Cisneros is a 54 y.o. female  Chief Complaint  Patient presents with  . Anxiety    Ativan working well  . Hypertension    Patient states taht her  BP is running high today 137/97/ 128 p   ANXIETY/DEPRESSION- has not started the 60mg  because she was afraid it may be causing her hot flashes.  Duration: since her husband's death Status:exacerbated Anxious mood: yes  Excessive worrying: no Irritability: no  Sweating: no Nausea: no Palpitations:no Hyperventilation: no Panic attacks: yes Agoraphobia: no  Obscessions/compulsions: no Depressed mood: yes Depression screen [facility] 2/9 02/21/2020 01/31/2020 09/13/2019 06/01/2019 11/23/2018  Decreased Interest 3 3 0 0 0  Down, Depressed, Hopeless 3 3 0 0 0  PHQ - 2 Score 6 6 0 0 0  Altered sleeping 0 3 3 1 1   Tired, decreased energy 3 3 0 1 2  Change in appetite 3 3 0 0 0  Feeling bad or failure about yourself  0 3 0 0 0  Trouble concentrating 3 3 0 1 1  Moving slowly or fidgety/restless 0 0 0 0 0  Suicidal thoughts 0 0 0 0 0  PHQ-9 Score 15 21 3 3 4   Difficult doing work/chores Not difficult at all Very difficult - Somewhat difficult Somewhat difficult  Some recent data might be hidden   Anhedonia: no Weight changes: no Insomnia: no   Hypersomnia: no Fatigue/loss of energy: yes Feelings of worthlessness: yes Feelings of guilt: yes Impaired concentration/indecisiveness: no Suicidal ideations: no  Crying spells: yes Recent Stressors/Life Changes: yes   Relationship problems: no   Family stress: yes     Financial stress: no    Job stress: no    Recent death/loss: yes  Relevant past medical, surgical, family and social history reviewed and updated as indicated. Interim medical history since our last visit reviewed. Allergies and medications reviewed and updated.  Review of  Systems  Constitutional: Negative.   Respiratory: Negative.   Cardiovascular: Negative.   Gastrointestinal: Negative.   Musculoskeletal: Negative.   Skin: Negative.   Psychiatric/Behavioral: Positive for dysphoric mood and sleep disturbance. Negative for agitation, behavioral problems, confusion, decreased concentration, hallucinations, self-injury and suicidal ideas. The patient is nervous/anxious. The patient is not hyperactive.     Per HPI unless specifically indicated above     Objective:    BP (!) 137/97   Pulse (!) 128   Wt Readings from Last 3 Encounters:  01/31/20 148 lb (67.1 kg)  01/18/20 148 lb 12.8 oz (67.5 kg)  11/30/19 151 lb (68.5 kg)    Physical Exam Vitals and nursing note reviewed.  Constitutional:      General: She is not in acute distress.    Appearance: Normal appearance. She is not ill-appearing, toxic-appearing or diaphoretic.  HENT:     Head: Normocephalic and atraumatic.     Right Ear: External ear normal.     Left Ear: External ear normal.     Nose: Nose normal.     Mouth/Throat:     Mouth: Mucous membranes are moist.     Pharynx: Oropharynx is clear.  Eyes:     General: No scleral icterus.       Right eye: No discharge.        Left eye: No discharge.  Conjunctiva/sclera: Conjunctivae normal.     Pupils: Pupils are equal, round, and reactive to light.  Pulmonary:     Effort: Pulmonary effort is normal. No respiratory distress.     Comments: Speaking in full sentences Musculoskeletal:        General: Normal range of motion.     Cervical back: Normal range of motion.  Skin:    Coloration: Skin is not jaundiced or pale.     Findings: No bruising, erythema, lesion or rash.  Neurological:     Mental Status: She is alert and oriented to person, place, and time. Mental status is at baseline.  Psychiatric:        Mood and Affect: Mood normal.        Behavior: Behavior normal.        Thought Content: Thought content normal.        Judgment:  Judgment normal.     Results for orders placed or performed during the hospital encounter of 02/07/20  Surgical pathology  Result Value Ref Range   SURGICAL PATHOLOGY      SURGICAL PATHOLOGY CASE: [id number] PATIENT: Morgan Cisneros Surgical Pathology Report     Specimen Submitted: A. Right breast, lower inner quadrant; stereo bx  Clinical History: Right breast mass/asymmetry 11 mm in size, Benign vs. carcinoma. Coil-shaped clip placed following stereotactic biopsy of RIGHT breast, lower inner quadrant.    DIAGNOSIS: A. 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 - FIBROSIS, FAT NECROSIS, HEMORRHAGE, AND MIXED INFLAMMATION, COMPATIBLE WITH RECENT BIOPSY SITE. - NEGATIVE FOR ATYPICAL PROLIFERATIVE BREAST DISEASE.  GROSS DESCRIPTION: A. Labeled: Right breast stereo biopsy mass Received: in a formalin-filled Brevera collection device Specimen radiograph image(s) available for review Time/Date in fixative: 1:09 PM on 02/07/2020 Cold ischemic time: 3 minutes Total fixation time: 6-1/2 hours Core pieces: Multiple Measurement:  1.2 x 1 x 0.9 cm Description / comments: Aggregate of pale yellow cylindrically shaped fibrofatty breast tissue in the A-I sections of the device, however section F is empty Inked: Green Entirely submitted in cassette(s): 1-section A-B 2-section C-D 3-section E, G 4-section H-I  Final Diagnosis performed by Allena Napoleon, MD.   Electronically signed 02/08/2020 8:53:07AM The electronic signature indicates that the named Attending Pathologist has evaluated the specimen Technical component performed at [facility], [address] [place], [place] [address] Lab: [phone] Dir: Rush Farmer, MD, MMM  Professional component performed at [facility], [facility], [place], [place], [place] [address] Lab: [phone] Dir: Dellia Nims. Reuel Derby, MD       Assessment & Plan:   Problem List Items Addressed This Visit    None    Visit Diagnoses    Grief    -  Primary   Still very fresh, didn't go up on cymbalta. Wants to stay where she is. Ativan is helping with anxiety/sleep. Continue current regimen. Seeing counselor this wk       Follow up plan: Return 2-3 weeks.    . This visit was completed via MyChart due to the restrictions of the COVID-19 pandemic. All issues as above were discussed and addressed. Physical exam was done as above through visual confirmation on MyChart. If it was felt that the patient should be evaluated in the office, they were directed there. The patient verbally consented to this visit. . Location of the patient: home . Location of the provider: work . Those involved with this call:  . Provider: Jinny Blossom  Wynetta Emery, DO . CMA: Frazier Butt, CMA . Front Desk/Registration: Jill Side  . Time spent on call: 15 minutes with patient face to face via video conference. More than 50% of this time was spent in counseling and coordination of care. 23 minutes total spent in review of patient's record and preparation of their chart.

## 2020-03-21 ENCOUNTER — Telehealth: Payer: Self-pay

## 2020-03-31 ENCOUNTER — Telehealth (INDEPENDENT_AMBULATORY_CARE_PROVIDER_SITE_OTHER): Payer: BC Managed Care – PPO | Admitting: Family Medicine

## 2020-03-31 ENCOUNTER — Encounter: Payer: Self-pay | Admitting: Family Medicine

## 2020-03-31 VITALS — BP 123/82 | HR 75

## 2020-03-31 DIAGNOSIS — F4321 Adjustment disorder with depressed mood: Secondary | ICD-10-CM

## 2020-03-31 NOTE — Progress Notes (Signed)
BP 123/82   Pulse 75    Subjective:    Patient ID: Morgan Cisneros, female    DOB: October 28, 1965, 54 y.o.   MRN: [id number]  HPI: Morgan Cisneros is a 54 y.o. female  Chief Complaint  Patient presents with  . Depression    2 - 3 week f/up  . Anxiety   DEPRESSION- has been using the lorazepam just at night time, doing OK Mood status: a few months Status: better Satisfied with current treatment?: yes Symptom severity: moderate  Duration of current treatment : months Side effects: no Medication compliance: excellent compliance Psychotherapy/counseling: yes  Previous psychiatric medications: cymbalta, lorazepam Depressed mood: yes Anxious mood: yes Anhedonia: no Significant weight loss or gain: no Insomnia: yes hard to fall asleep Fatigue: yes Feelings of worthlessness or guilt: no Impaired concentration/indecisiveness: no Suicidal ideations: no Hopelessness: no Crying spells: yes Depression screen [facility] 2/9 03/31/2020 02/21/2020 01/31/2020 09/13/2019 06/01/2019  Decreased Interest 3 3 3  0 0  Down, Depressed, Hopeless 2 3 3  0 0  PHQ - 2 Score 5 6 6  0 0  Altered sleeping 3 0 3 3 1   Tired, decreased energy 3 3 3  0 1  Change in appetite 2 3 3  0 0  Feeling bad or failure about yourself  1 0 3 0 0  Trouble concentrating 3 3 3  0 1  Moving slowly or fidgety/restless 0 0 0 0 0  Suicidal thoughts 0 0 0 0 0  PHQ-9 Score 17 15 21 3 3   Difficult doing work/chores Somewhat difficult Not difficult at all Very difficult - Somewhat difficult  Some recent data might be hidden   GAD 7 : Generalized Anxiety Score 03/31/2020 02/21/2020 01/31/2020 11/23/2018  Nervous, Anxious, on Edge 3 3 3 1   Control/stop worrying 3 2 3 1   Worry too much - different things 3 3 3 1   Trouble relaxing 3 1 3 1   Restless 1 0 0 0  Easily annoyed or irritable 1 0 0 0  Afraid - awful might happen 1 0 0 1  Total GAD 7 Score 15 9 12 5   Anxiety Difficulty Somewhat difficult - Very difficult Not difficult at  all    Relevant past medical, surgical, family and social history reviewed and updated as indicated. Interim medical history since our last visit reviewed. Allergies and medications reviewed and updated.  Review of Systems  Constitutional: Negative.   Respiratory: Negative.   Cardiovascular: Negative.   Gastrointestinal: Negative.   Musculoskeletal: Negative.   Neurological: Negative.   Psychiatric/Behavioral: Positive for dysphoric mood and sleep disturbance. Negative for agitation, behavioral problems, confusion, decreased concentration, hallucinations, self-injury and suicidal ideas. The patient is nervous/anxious. The patient is not hyperactive.     Per HPI unless specifically indicated above     Objective:    BP 123/82   Pulse 75   Wt Readings from Last 3 Encounters:  01/31/20 148 lb (67.1 kg)  01/18/20 148 lb 12.8 oz (67.5 kg)  11/30/19 151 lb (68.5 kg)    Physical Exam Vitals and nursing note reviewed.  Constitutional:      General: She is not in acute distress.    Appearance: Normal appearance. She is not ill-appearing, toxic-appearing or diaphoretic.  HENT:     Head: Normocephalic and atraumatic.     Right Ear: External ear normal.     Left Ear: External ear normal.     Nose: Nose normal.     Mouth/Throat:     Mouth: Mucous  membranes are moist.     Pharynx: Oropharynx is clear.  Eyes:     General: No scleral icterus.       Right eye: No discharge.        Left eye: No discharge.     Conjunctiva/sclera: Conjunctivae normal.     Pupils: Pupils are equal, round, and reactive to light.  Pulmonary:     Effort: Pulmonary effort is normal. No respiratory distress.     Comments: Speaking in full sentences Musculoskeletal:        General: Normal range of motion.     Cervical back: Normal range of motion.  Skin:    Coloration: Skin is not jaundiced or pale.     Findings: No bruising, erythema, lesion or rash.  Neurological:     Mental Status: She is alert and  oriented to person, place, and time. Mental status is at baseline.  Psychiatric:        Mood and Affect: Mood normal.        Behavior: Behavior normal.        Thought Content: Thought content normal.        Judgment: Judgment normal.     Results for orders placed or performed during the hospital encounter of 02/07/20  Surgical pathology  Result Value Ref Range   SURGICAL PATHOLOGY      SURGICAL PATHOLOGY CASE: [id number] PATIENT: Morgan Cisneros Surgical Pathology Report     Specimen Submitted: A. Right breast, lower inner quadrant; stereo bx  Clinical History: Right breast mass/asymmetry 11 mm in size, Benign vs. carcinoma. Coil-shaped clip placed following stereotactic biopsy of RIGHT breast, lower inner quadrant.    DIAGNOSIS: A. 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 - FIBROSIS, FAT NECROSIS, HEMORRHAGE, AND MIXED INFLAMMATION, COMPATIBLE WITH RECENT BIOPSY SITE. - NEGATIVE FOR ATYPICAL PROLIFERATIVE BREAST DISEASE.  GROSS DESCRIPTION: A. Labeled: Right breast stereo biopsy mass Received: in a formalin-filled Brevera collection device Specimen radiograph image(s) available for review Time/Date in fixative: 1:09 PM on 02/07/2020 Cold ischemic time: 3 minutes Total fixation time: 6-1/2 hours Core pieces: Multiple Measurement:  1.2 x 1 x 0.9 cm Description / comments: Aggregate of pale yellow cylindrically shaped fibrofatty breast tissue in the A-I sections of the device, however section F is empty Inked: Green Entirely submitted in cassette(s): 1-section A-B 2-section C-D 3-section E, G 4-section H-I  Final Diagnosis performed by Allena Napoleon, MD.   Electronically signed 02/08/2020 8:53:07AM The electronic signature indicates that the named Attending Pathologist has evaluated the specimen Technical component performed at [facility], [address],  [place], [place] [address] Lab: [phone] Dir: Rush Farmer, MD, MMM  Professional component performed at [facility], [facility], [place], [place], [place] [address] Lab: [phone] Dir: Dellia Nims. Reuel Derby, MD       Assessment & Plan:   Problem List Items Addressed This Visit   None   Visit Diagnoses    Grief    -  Primary   Doing a little better. Feeling a little more like herself. Continue current regimen. Continue to monitor. Recheck 2 months.        Follow up plan: Return in about 2 months (around 06/01/2020).   . This visit was completed via MyChart due to the restrictions of the COVID-19 pandemic. All issues as above were discussed and addressed. Physical exam was done as above through visual confirmation on MyChart. If it was felt that  the patient should be evaluated in the office, they were directed there. The patient verbally consented to this visit. . Location of the patient: home . Location of the provider: work . Those involved with this call:  . Provider: Park Liter, DO . CMA: Yvonna Alanis, [place] . Front Desk/Registration: Barth Kirks  . Time spent on call: 15 minutes with patient face to face via video conference. More than 50% of this time was spent in counseling and coordination of care. 23 minutes total spent in review of patient's record and preparation of their chart.

## 2020-04-01 ENCOUNTER — Telehealth: Payer: Self-pay

## 2020-04-01 NOTE — Telephone Encounter (Signed)
-----   Message from Dorcas Carrow, DO sent at 03/31/2020 10:11 AM EST ----- 2 months follow up

## 2020-04-08 ENCOUNTER — Encounter: Payer: Self-pay | Admitting: Family Medicine

## 2020-04-08 ENCOUNTER — Telehealth (INDEPENDENT_AMBULATORY_CARE_PROVIDER_SITE_OTHER): Payer: BC Managed Care – PPO | Admitting: Family Medicine

## 2020-04-08 VITALS — BP 135/97 | HR 95 | Wt 128.2 lb

## 2020-04-08 DIAGNOSIS — Z20822 Contact with and (suspected) exposure to covid-19: Secondary | ICD-10-CM | POA: Diagnosis not present

## 2020-04-08 DIAGNOSIS — J0100 Acute maxillary sinusitis, unspecified: Secondary | ICD-10-CM | POA: Diagnosis not present

## 2020-04-08 DIAGNOSIS — G4700 Insomnia, unspecified: Secondary | ICD-10-CM

## 2020-04-08 MED ORDER — ZOLPIDEM TARTRATE 10 MG PO TABS: 10.0000 mg | ORAL_TABLET | Freq: Every evening | ORAL | 2 refills | Status: DC | PRN

## 2020-04-08 MED ORDER — AMOXICILLIN-POT CLAVULANATE 875-125 MG PO TABS
1.0000 | ORAL_TABLET | Freq: Two times a day (BID) | ORAL | 0 refills | Status: DC
Start: 2020-04-08 — End: 2020-05-01

## 2020-04-08 MED ORDER — PREDNISONE 50 MG PO TABS: 50.0000 mg | ORAL_TABLET | Freq: Every day | ORAL | 0 refills | Status: DC

## 2020-04-08 NOTE — Progress Notes (Signed)
BP (!) 135/97   Pulse 95   Wt 128 lb 3.2 oz (58.2 kg)   BMI 21.33 kg/m    Subjective:    Patient ID: Morgan Cisneros, female    DOB: 1966/01/26, 55 y.o.   MRN: [id number]  HPI: Morgan Cisneros is a 55 y.o. female  Chief Complaint  Patient presents with  . Sore Throat    Pt has been having sore throat for a few days, has cough and chest congestion    UPPER RESPIRATORY TRACT INFECTION Duration: 5 days (Friday 12/31) Worst symptom: cough Fever: no Cough: yes Shortness of breath: no Wheezing: no Chest pain: no Chest tightness: no Chest congestion: yes Nasal congestion: yes Runny nose: yes Post nasal drip: yes Sneezing: no Sore throat: yes Swollen glands: no Sinus pressure: yes Headache: yes Face pain: no Toothache: no Ear pain: no  Ear pressure: no  Eyes red/itching:no Eye drainage/crusting: no  Vomiting: no Rash: no Fatigue: no Sick contacts: yes Strep contacts: no  Context: worse Recurrent sinusitis: no Relief with OTC cold/cough medications: no  Treatments attempted: mucinex and tylenol   INSOMNIA- doing better. Does not think she needs her lorazepam any more. Would like to go back on her ambien to help just with sleep Duration: chronic Satisfied with sleep quality: yes Difficulty falling asleep: yes Difficulty staying asleep: no Waking a few hours after sleep onset: no Early morning awakenings: no Daytime hypersomnolence: no Wakes feeling refreshed: yes Good sleep hygiene: yes Apnea: no Snoring: no Depressed/anxious mood: yes Recent stress: yes Restless legs/nocturnal leg cramps: no Chronic pain/arthritis: no History of sleep study: yes Treatments attempted: melatonin, uinsom, benadryl and ambien    Relevant past medical, surgical, family and social history reviewed and updated as indicated. Interim medical history since our last visit reviewed. Allergies and medications reviewed and updated.  Review of Systems  Constitutional:  Negative.   HENT: Positive for congestion, postnasal drip, rhinorrhea, sinus pressure and sinus pain. Negative for dental problem, drooling, ear discharge, ear pain, facial swelling, hearing loss, mouth sores, nosebleeds, sneezing, sore throat, tinnitus, trouble swallowing and voice change.   Respiratory: Positive for cough and shortness of breath. Negative for apnea, choking, chest tightness, wheezing and stridor.   Cardiovascular: Negative.   Gastrointestinal: Negative.   Musculoskeletal: Negative.   Psychiatric/Behavioral: Positive for sleep disturbance. Negative for agitation, behavioral problems, confusion, decreased concentration, dysphoric mood, hallucinations, self-injury and suicidal ideas. The patient is not nervous/anxious and is not hyperactive.     Per HPI unless specifically indicated above     Objective:    BP (!) 135/97   Pulse 95   Wt 128 lb 3.2 oz (58.2 kg)   BMI 21.33 kg/m   Wt Readings from Last 3 Encounters:  04/08/20 128 lb 3.2 oz (58.2 kg)  01/31/20 148 lb (67.1 kg)  01/18/20 148 lb 12.8 oz (67.5 kg)    Physical Exam Vitals and nursing note reviewed.  Constitutional:      General: She is not in acute distress.    Appearance: Normal appearance. She is not ill-appearing, toxic-appearing or diaphoretic.  HENT:     Head: Normocephalic and atraumatic.     Right Ear: External ear normal.     Left Ear: External ear normal.     Nose: Nose normal.     Mouth/Throat:     Mouth: Mucous membranes are moist.     Pharynx: Oropharynx is clear.  Eyes:     General: No scleral icterus.  Right eye: No discharge.        Left eye: No discharge.     Conjunctiva/sclera: Conjunctivae normal.     Pupils: Pupils are equal, round, and reactive to light.  Pulmonary:     Effort: Pulmonary effort is normal. No respiratory distress.     Comments: Speaking in full sentences Musculoskeletal:        General: Normal range of motion.     Cervical back: Normal range of motion.   Skin:    Coloration: Skin is not jaundiced or pale.     Findings: No bruising, erythema, lesion or rash.  Neurological:     Mental Status: She is alert and oriented to person, place, and time. Mental status is at baseline.  Psychiatric:        Mood and Affect: Mood normal.        Behavior: Behavior normal.        Thought Content: Thought content normal.        Judgment: Judgment normal.     Results for orders placed or performed during the hospital encounter of 02/07/20  Surgical pathology  Result Value Ref Range   SURGICAL PATHOLOGY      SURGICAL PATHOLOGY CASE: [id number] PATIENT: Morgan Cisneros Surgical Pathology Report     Specimen Submitted: A. Right breast, lower inner quadrant; stereo bx  Clinical History: Right breast mass/asymmetry 11 mm in size, Benign vs. carcinoma. Coil-shaped clip placed following stereotactic biopsy of RIGHT breast, lower inner quadrant.    DIAGNOSIS: A. 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 - FIBROSIS, FAT NECROSIS, HEMORRHAGE, AND MIXED INFLAMMATION, COMPATIBLE WITH RECENT BIOPSY SITE. - NEGATIVE FOR ATYPICAL PROLIFERATIVE BREAST DISEASE.  GROSS DESCRIPTION: A. Labeled: Right breast stereo biopsy mass Received: in a formalin-filled Brevera collection device Specimen radiograph image(s) available for review Time/Date in fixative: 1:09 PM on 02/07/2020 Cold ischemic time: 3 minutes Total fixation time: 6-1/2 hours Core pieces: Multiple Measurement:  1.2 x 1 x 0.9 cm Description / comments: Aggregate of pale yellow cylindrically shaped fibrofatty breast tissue in the A-I sections of the device, however section F is empty Inked: Green Entirely submitted in cassette(s): 1-section A-B 2-section C-D 3-section E, G 4-section H-I  Final Diagnosis performed by Katherine Mantle, MD.   Electronically signed 02/08/2020  8:53:07AM The electronic signature indicates that the named Attending Pathologist has evaluated the specimen Technical component performed at [facility], [address], [place], [place] [address] Lab: [phone] Dir: Jolene Schimke, MD, MMM  Professional component performed at [facility], [facility], [address] [place], [place], [place] [address] Lab: [phone] Dir: Georgiann Cocker. Oneita Kras, MD       Assessment & Plan:   Problem List Items Addressed This Visit      Other   Insomnia    Doing better with her grief and anxiety, but still having issues with sleep. Will go back on her [place]. Refills for 3 months given. Follow up 3 months. Call with any concerns.        Other Visit Diagnoses    Acute non-recurrent maxillary sinusitis    -  Primary   Will treat with prednisone. If not better in 3 days, start augmentin. Call with any concerns. Continue to monitor.    Relevant Medications   amoxicillin-clavulanate (AUGMENTIN) 875-125 MG tablet   predniSONE (DELTASONE) 50 MG tablet   Suspected COVID-19 virus infection       Will come in for  swab in the AM. Self-quarantine until results are back. Continue to monitor. Symptomatic treatment. Continue to monitor.        Follow up plan: Return in about 3 months (around 07/07/2020).    . This visit was completed via MyChart due to the restrictions of the COVID-19 pandemic. All issues as above were discussed and addressed. Physical exam was done as above through visual confirmation on MyChart. If it was felt that the patient should be evaluated in the office, they were directed there. The patient verbally consented to this visit. . Location of the patient: home . Location of the provider: work . Those involved with this call:  . Provider: Park Liter, DO . CMA: Louanna Raw, [place] . Front Desk/Registration: Barth Kirks  . Time spent on call: 25 minutes with patient face to face via video conference. More than 50% of this time was  spent in counseling and coordination of care. 40 minutes total spent in review of patient's record and preparation of their chart.

## 2020-04-08 NOTE — Addendum Note (Signed)
Addended by: Dorcas Carrow on: 04/08/2020 04:13 PM   Modules accepted: Orders

## 2020-04-08 NOTE — Assessment & Plan Note (Signed)
Doing better with her grief and anxiety, but still having issues with sleep. Will go back on her [place]. Refills for 3 months given. Follow up 3 months. Call with any concerns.

## 2020-04-09 NOTE — Addendum Note (Signed)
Addended by: Enedina Finner on: 04/09/2020 11:27 AM   Modules accepted: Orders

## 2020-04-11 LAB — SARS-COV-2, NAA 2 DAY TAT

## 2020-04-11 LAB — NOVEL CORONAVIRUS, NAA: SARS-CoV-2, NAA: NOT DETECTED

## 2020-04-15 ENCOUNTER — Encounter: Payer: Self-pay | Admitting: Family Medicine

## 2020-04-15 NOTE — Telephone Encounter (Signed)
Pts sister called stating that she is concerned with her sister being off of the ativan. She states that the pt called her today and was crying uncontrollably. She states that the pt is needing to be put back on this as soon as possible. Please advise.

## 2020-04-15 NOTE — Telephone Encounter (Signed)
Sister not on DPR message has already been sent to PCP

## 2020-04-17 ENCOUNTER — Telehealth (INDEPENDENT_AMBULATORY_CARE_PROVIDER_SITE_OTHER): Payer: BC Managed Care – PPO | Admitting: Family Medicine

## 2020-04-17 ENCOUNTER — Encounter: Payer: Self-pay | Admitting: Family Medicine

## 2020-04-17 DIAGNOSIS — F339 Major depressive disorder, recurrent, unspecified: Secondary | ICD-10-CM

## 2020-04-17 MED ORDER — DULOXETINE HCL 60 MG PO CPEP: 60.0000 mg | ORAL_CAPSULE | Freq: Every day | ORAL | 3 refills | Status: DC

## 2020-04-17 MED ORDER — LORAZEPAM 0.5 MG PO TABS: 0.5000 mg | ORAL_TABLET | Freq: Two times a day (BID) | ORAL | 2 refills | Status: DC | PRN

## 2020-04-17 NOTE — Progress Notes (Signed)
BP 121/85   Pulse 86   Temp 97.8 F (36.6 C) (Oral)    Subjective:    Patient ID: Morgan Cisneros, female    DOB: [birth year], 55 y.o.   MRN: [id number]  HPI: Morgan Cisneros is a 55 y.o. female  Chief Complaint  Patient presents with  . Depression  . Anxiety    Pt states she thinks she may need to go back on Lorazepam   Came off her lorazepam, even though she was just taking it at night and she started feeling much much worse. She notes that she has been crying and very sad. She has not slept in like 3 days. She is not feeling like herself. She thinks she went off the lorazepam too soon.   ANXIETY/STRESS Duration: Chronic Status: exacerbated Anxious mood: yes  Excessive worrying: yes Irritability: no  Sweating: no Nausea: no Palpitations:yes Hyperventilation: yes Panic attacks: no Agoraphobia: no  Obscessions/compulsions: no Depressed mood: yes Depression screen [facility] 2/9 04/17/2020 03/31/2020 02/21/2020 01/31/2020 09/13/2019  Decreased Interest 3 3 3 3  0  Down, Depressed, Hopeless 3 2 3 3  0  PHQ - 2 Score 6 5 6 6  0  Altered sleeping 3 3 0 3 3  Tired, decreased energy 3 3 3 3  0  Change in appetite 3 2 3 3  0  Feeling bad or failure about yourself  3 1 0 3 0  Trouble concentrating 3 3 3 3  0  Moving slowly or fidgety/restless 0 0 0 0 0  Suicidal thoughts 0 0 0 0 0  PHQ-9 Score 21 17 15 21 3   Difficult doing work/chores Extremely dIfficult Somewhat difficult Not difficult at all Very difficult -  Some recent data might be hidden   Anhedonia: no Weight changes: no Insomnia: yes hard to fall asleep  Hypersomnia: no Fatigue/loss of energy: yes Feelings of worthlessness: no Feelings of guilt: yes Impaired concentration/indecisiveness: no Suicidal ideations: no  Crying spells: yes Recent Stressors/Life Changes: yes   Relationship problems: no   Family stress: yes     Financial stress: no    Job stress: no    Recent death/loss: yes   Relevant past medical,  surgical, family and social history reviewed and updated as indicated. Interim medical history since our last visit reviewed. Allergies and medications reviewed and updated.  Review of Systems  Constitutional: Negative.   Respiratory: Negative.   Cardiovascular: Negative.   Gastrointestinal: Negative.   Musculoskeletal: Negative.   Psychiatric/Behavioral: Positive for agitation, dysphoric mood and sleep disturbance. Negative for behavioral problems, confusion, decreased concentration, hallucinations, self-injury and suicidal ideas. The patient is nervous/anxious. The patient is not hyperactive.     Per HPI unless specifically indicated above     Objective:    BP 121/85   Pulse 86   Temp 97.8 F (36.6 C) (Oral)   Wt Readings from Last 3 Encounters:  04/08/20 128 lb 3.2 oz (58.2 kg)  01/31/20 148 lb (67.1 kg)  01/18/20 148 lb 12.8 oz (67.5 kg)    Physical Exam Vitals and nursing note reviewed.  Constitutional:      General: She is not in acute distress.    Appearance: Normal appearance. She is not ill-appearing, toxic-appearing or diaphoretic.  HENT:     Head: Normocephalic and atraumatic.     Right Ear: External ear normal.     Left Ear: External ear normal.     Nose: Nose normal.     Mouth/Throat:     Mouth: Mucous membranes  are moist.     Pharynx: Oropharynx is clear.  Eyes:     General: No scleral icterus.       Right eye: No discharge.        Left eye: No discharge.     Conjunctiva/sclera: Conjunctivae normal.     Pupils: Pupils are equal, round, and reactive to light.  Pulmonary:     Effort: Pulmonary effort is normal. No respiratory distress.     Comments: Speaking in full sentences Musculoskeletal:        General: Normal range of motion.     Cervical back: Normal range of motion.  Skin:    Coloration: Skin is not jaundiced or pale.     Findings: No bruising, erythema, lesion or rash.  Neurological:     Mental Status: She is alert and oriented to person,  place, and time. Mental status is at baseline.  Psychiatric:        Mood and Affect: Mood normal.        Behavior: Behavior normal.        Thought Content: Thought content normal.        Judgment: Judgment normal.     Results for orders placed or performed in visit on 04/08/20  Novel Coronavirus, NAA ([facility])   Specimen: Saline  Result Value Ref Range   SARS-CoV-2, NAA Not Detected Not Detected  SARS-COV-2, NAA 2 DAY TAT  Result Value Ref Range   SARS-CoV-2, NAA 2 DAY TAT Performed       Assessment & Plan:   Problem List Items Addressed This Visit      Other   Depression, recurrent ([place])    Increase her cymbalta to 60mg . Restart lorazepam PRN. Call with any concerns. Recheck 2 weeks.       Relevant Medications   LORazepam (ATIVAN) 0.5 MG tablet   DULoxetine (CYMBALTA) 60 MG capsule       Follow up plan: Return in about 2 weeks (around 05/01/2020).   . This visit was completed via MyChart due to the restrictions of the COVID-19 pandemic. All issues as above were discussed and addressed. Physical exam was done as above through visual confirmation on MyChart. If it was felt that the patient should be evaluated in the office, they were directed there. The patient verbally consented to this visit. . Location of the patient: home . Location of the provider: home . Those involved with this call:  . Provider: Park Liter, DO . CMA: Yvonna Alanis, [place] . Front Desk/Registration: Jill Side  . Time spent on call: 15 minutes with patient face to face via video conference. More than 50% of this time was spent in counseling and coordination of care. 23 minutes total spent in review of patient's record and preparation of their chart.

## 2020-04-20 ENCOUNTER — Encounter: Payer: Self-pay | Admitting: Family Medicine

## 2020-04-20 NOTE — Assessment & Plan Note (Signed)
Increase her cymbalta to 60mg . Restart lorazepam PRN. Call with any concerns. Recheck 2 weeks.

## 2020-04-23 ENCOUNTER — Telehealth: Payer: Self-pay

## 2020-04-24 ENCOUNTER — Telehealth: Payer: Self-pay

## 2020-04-24 NOTE — Telephone Encounter (Signed)
Called to schedule 2 weeks, virtual or in person OK  no answer left vm

## 2020-05-01 ENCOUNTER — Telehealth (INDEPENDENT_AMBULATORY_CARE_PROVIDER_SITE_OTHER): Payer: BC Managed Care – PPO | Admitting: Unknown Physician Specialty

## 2020-05-01 ENCOUNTER — Encounter: Payer: Self-pay | Admitting: Unknown Physician Specialty

## 2020-05-01 DIAGNOSIS — J0100 Acute maxillary sinusitis, unspecified: Secondary | ICD-10-CM | POA: Diagnosis not present

## 2020-05-01 MED ORDER — HYDROCOD POLST-CPM POLST ER 10-8 MG/5ML PO SUER
5.0000 mL | Freq: Two times a day (BID) | ORAL | 0 refills | Status: DC | PRN
Start: 2020-05-01 — End: 2020-05-08

## 2020-05-01 MED ORDER — AMOXICILLIN-POT CLAVULANATE 875-125 MG PO TABS: 1.0000 | ORAL_TABLET | Freq: Two times a day (BID) | ORAL | 0 refills | Status: DC

## 2020-05-01 NOTE — Progress Notes (Signed)
There were no vitals taken for this visit.   Subjective:    Patient ID: Morgan Cisneros, female    DOB: 08-Oct-1965, 55 y.o.   MRN: [id number]  HPI: Morgan Cisneros is a 55 y.o. female  Chief Complaint  Patient presents with  . URI    X 1 week, has taken 2 covid tests which were both negative   Due to the catastrophic nature of the COVID-19 pandemic, this visit was completed via audio and visual contact via caregility due to the restrictions of the COVID-19 pandemic. All issues as above were discussed and addressed. Physical exam was done as above through visual confirmation on Caregility. If it was felt that the patient should be evaluated in the office, they were directed there. The patient verbally consented to this visit."} . Location of the patient: home . Location of the provider: work . Those involved with this call:  . Provider: Kathrine Haddock, DNP . CMA: Tiffany Reel, CMA . Front Desk/Registration: Jill Side  . Time spent on call: 15 minutes with patient face to face via video conference. More than 50% of this time was spent in counseling and coordination of care. 5 minutes total spent in review of patient's record and preparation of their chart.  I verified patient identity using two factors (patient name and date of birth). Patient consents verbally to being seen via telemedicine visit today.   URI  This is a new problem. Episode onset: 10 days. The problem has been gradually worsening. There has been no fever. Associated symptoms include congestion, coughing, ear pain, sinus pain and a sore throat. Pertinent negatives include no chest pain, headaches, neck pain or rhinorrhea.     Relevant past medical, surgical, family and social history reviewed and updated as indicated. Interim medical history since our last visit reviewed. Allergies and medications reviewed and updated.  Review of Systems  HENT: Positive for congestion, ear pain, sinus pain and sore  throat. Negative for rhinorrhea.   Respiratory: Positive for cough.   Cardiovascular: Negative for chest pain.  Musculoskeletal: Negative for neck pain.  Neurological: Negative for headaches.    Per HPI unless specifically indicated above     Objective:    There were no vitals taken for this visit.  Wt Readings from Last 3 Encounters:  04/08/20 128 lb 3.2 oz (58.2 kg)  01/31/20 148 lb (67.1 kg)  01/18/20 148 lb 12.8 oz (67.5 kg)    Physical Exam Constitutional:      General: She is not in acute distress.    Appearance: Normal appearance. She is well-developed and well-nourished.  HENT:     Head: Normocephalic and atraumatic.     Nose:     Right Sinus: Maxillary sinus tenderness present.     Left Sinus: Maxillary sinus tenderness present.     Comments: Left worse than right Eyes:     General: Lids are normal. No scleral icterus.       Right eye: No discharge.        Left eye: No discharge.     Conjunctiva/sclera: Conjunctivae normal.  Cardiovascular:     Rate and Rhythm: Normal rate.  Pulmonary:     Effort: Pulmonary effort is normal.  Abdominal:     Palpations: There is no hepatomegaly or splenomegaly.  Musculoskeletal:        General: Normal range of motion.  Skin:    General: Skin is intact.     Coloration: Skin is not pale.  Findings: No rash.  Neurological:     Mental Status: She is alert and oriented to person, place, and time.  Psychiatric:        Mood and Affect: Mood and affect normal.        Behavior: Behavior normal.        Thought Content: Thought content normal.        Judgment: Judgment normal.        Assessment & Plan:   Problem List Items Addressed This Visit   None   Visit Diagnoses    Acute non-recurrent maxillary sinusitis    -  Primary   Will rx Augmentin 875 mg BID for 7 days.  Tussinex for cough at night.  Supportive care.     Relevant Medications   amoxicillin-clavulanate (AUGMENTIN) 875-125 MG tablet    chlorpheniramine-HYDROcodone (TUSSIONEX PENNKINETIC ER) 10-8 MG/5ML SUER       Follow up plan: Return if symptoms worsen or fail to improve.

## 2020-05-02 ENCOUNTER — Ambulatory Visit: Payer: BC Managed Care – PPO | Admitting: Licensed Clinical Social Worker

## 2020-05-02 DIAGNOSIS — F419 Anxiety disorder, unspecified: Secondary | ICD-10-CM

## 2020-05-02 DIAGNOSIS — J0100 Acute maxillary sinusitis, unspecified: Secondary | ICD-10-CM

## 2020-05-02 DIAGNOSIS — G4700 Insomnia, unspecified: Secondary | ICD-10-CM

## 2020-05-02 DIAGNOSIS — F4321 Adjustment disorder with depressed mood: Secondary | ICD-10-CM

## 2020-05-02 DIAGNOSIS — F339 Major depressive disorder, recurrent, unspecified: Secondary | ICD-10-CM

## 2020-05-02 NOTE — Chronic Care Management (AMB) (Signed)
Chronic Care Management    Clinical Social Work Note  05/02/2020 Name: Morgan Cisneros MRN: [id number] DOB: 1965/11/12  Morgan Cisneros is a 55 y.o. year old female who is a primary care patient of Valerie Roys, DO. The CCM team was consulted to assist the patient with chronic disease management and/or care coordination needs related to: Mental Health Counseling and Resources and Grief Counseling.   Engaged with patient by telephone for follow up visit in response to provider referral for social work chronic care management and care coordination services.   Consent to Services:  The patient was given the following information about Chronic Care Management services today, agreed to services, and gave verbal consent: 1. CCM service includes personalized support from designated clinical staff supervised by the primary care provider, including individualized plan of care and coordination with other care providers 2. 24/7 contact phone numbers for assistance for urgent and routine care needs. 3. Service will only be billed when office clinical staff spend 20 minutes or more in a month to coordinate care. 4. Only one practitioner may furnish and bill the service in a calendar month. 5.The patient may stop CCM services at any time (effective at the end of the month) by phone call to the office staff. 6. The patient will be responsible for cost sharing (co-pay) of up to 20% of the service fee (after annual deductible is met). Patient agreed to services and consent obtained.  Patient agreed to services and consent obtained.   Assessment: Review of patient past medical history, allergies, medications, and health status, including review of relevant consultants reports was performed today as part of a comprehensive evaluation and provision of chronic care management and care coordination services.     SDOH (Social Determinants of Health) assessments and interventions performed:    Advanced  Directives Status: See Care Plan for related entries.  CCM Care Plan  Allergies  Allergen Reactions  . Wellbutrin [Bupropion] Other (See Comments)    irritiability  . Urocit-K [Potassium Citrate] Nausea Only    Outpatient Encounter Medications as of 05/02/2020  Medication Sig  . amoxicillin-clavulanate (AUGMENTIN) 875-125 MG tablet Take 1 tablet by mouth 2 (two) times daily.  . chlorpheniramine-HYDROcodone (TUSSIONEX PENNKINETIC ER) 10-8 MG/5ML SUER Take 5 mLs by mouth every 12 (twelve) hours as needed for cough.  . DULoxetine (CYMBALTA) 60 MG capsule Take 1 capsule (60 mg total) by mouth daily.  Marland Kitchen LORazepam (ATIVAN) 0.5 MG tablet Take 1-2 tablets (0.5-1 mg total) by mouth 2 (two) times daily as needed for anxiety.  . metoprolol succinate (TOPROL-XL) 100 MG 24 hr tablet Take 1 tablet (100 mg total) by mouth daily.  Marland Kitchen zolpidem (AMBIEN) 10 MG tablet Take 1 tablet (10 mg total) by mouth at bedtime as needed for sleep.   No facility-administered encounter medications on file as of 05/02/2020.    Patient Active Problem List   Diagnosis Date Noted  . Dupuytren's contracture 01/04/2019  . Essential hypertension 06/29/2018  . Depression, recurrent ([place]) 06/29/2018  . Benign neoplasm of ascending colon   . Anxiety 04/18/2017  . Spasm of back muscles 10/28/2016  . Diverticulitis 10/19/2016  . GERD (gastroesophageal reflux disease) 04/18/2015  . Menopausal symptoms 04/18/2015  . Insomnia   . Family history of colon cancer in mother 01/15/2015  . Family history of ovarian cancer 01/15/2015  . Tobacco abuse 01/15/2015  . Lymphadenopathy, axillary 01/15/2015  . Calculus of kidney 12/13/2014  . Female genuine stress incontinence 12/13/2014  Conditions to be addressed/monitored: Depression; Mental Health Concerns   Care Plan : General Social Work (Adult)  Updates made by Greg Cutter, LCSW since 05/02/2020 12:00 AM    Problem: Response to Treatment (Depression)     Long-Range  Goal: Response to Treatment Maximized   Start Date: 05/02/2020  This Visit's Progress: On track  Note:   Evidence-based guidance:   Engage patient in conversation about the perceived benefits of mental health treatment and quality of therapeutic alliance with his/her mental health professional.   Assess for barriers to attending appointments, such as transportation, financial, sense of slow or little improvement and forgetfulness.   Consider patient resistance to treatment based on stigma related to mental health diagnosis.   Maintain a documented system of ongoing contacts with patient during the first 6 to 12 months of treatment, as missed appointments and disengagement may signal deteriorating condition.   Provide anticipatory guidance about the risk of increased symptoms and potential psychiatric hospitalization for those who have a pattern of nonattendance at mental health appointments.   Re-screen for depressive symptoms at mutually identified intervals.   Notes:   Timeframe:  Long-Range Goal Priority:  Medium Start Date:  05/02/20                         Expected End Date:  06/30/20                    Follow Up Date- 06/19/20   - begin personal counseling - call and visit an old friend - check out volunteer opportunities - join a support group - laugh; watch a funny movie or comedian - learn and use visualization or guided imagery - perform a random act of kindness - practice relaxation or meditation daily - start or continue a personal journal - talk about feelings with a friend, family or spiritual advisor - practice positive thinking and self-talk    Why is this important?   When you are stressed, down or upset, your body reacts too.  For example, your blood pressure may get higher; you may have a headache or stomachache.  When your emotions get the best of you, your body's ability to fight off cold and flu gets weak.  These steps will help you manage your emotions.      Notes: Patient lost her spouse of 43 years unexpectedly from [place]. She is having a difficult time processing her complex grief. LCSW provided grief support and interventions during today's session. Patient shares that her job is her only outlet. LCSW educated patient on healthy ways to cope with ongoing grief symptoms. Patient admits that she cries herself to sleep every night. Patient shares that PCP adjusted her medication which helped some with her insomnia. Referral was placed for grief therapy with [facility] on 02/13/20 per patient's permission.    Depression- Low Depression screen [facility] 2/9 04/17/2020 03/31/2020 02/21/2020  Decreased Interest 3 3 3   Down, Depressed, Hopeless 3 2 3   PHQ - 2 Score 6 5 6   Altered sleeping 3 3 0  Tired, decreased energy 3 3 3   Change in appetite 3 2 3   Feeling bad or failure about yourself  3 1 0  Trouble concentrating 3 3 3   Moving slowly or fidgety/restless 0 0 0  Suicidal thoughts 0 0 0  PHQ-9 Score 21 17 15   Difficult doing work/chores Extremely dIfficult Somewhat difficult Not difficult at all  Some recent data might be  hidden    Current barriers:    Concerns for grief symptom management  . Lacks knowledge of where and how to connect for grief support . Needs Support, Education, and Care Coordination in order to meet unmet need.   Clinical Goal(s): Over the next 120 days, patient will work with SW to reduce or manage symptoms of grief until connected for ongoing counseling.   Clinical Interventions:  . Assessed patient's previous treatment, needs, coping skills, current treatment, support system and barriers to care . Patient interviewed and appropriate assessments performed or reviewed: brief mental health assessment . Provided basic mental health support, education and interventions ( grief management coping skill education) . Discussed several options for long term counseling based on need and insurance. Patient denies wanting to  continue grief therapy at this time. . Reviewed mental health medications with patient prescribed by PCP and discussed compliance  . Referral placed in the past for [facility] for grief counseling  . Other interventions include: Grief Counseling  . Collaboration with PCP regarding development and update of comprehensive plan of care as evidenced by provider attestation and co-signature . Inter-disciplinary care team collaboration (see longitudinal plan of care) . Patient completed video visit yesterday with Gabriel Cirri as patient is currently suffering from a chronic sinus infection. . Patient reports that PCP increased her Cymbalta medication which has increased her ability to manage her grief and depressive symptoms.   10 LITTLE Things To Do When You're Feeling Too Down To Do Anything  Take a shower. Even if you plan to stay in all day long and not see a soul, take a shower. It takes the most effort to hop in to the shower but once you do, you'll feel immediate results. It will wake you up and you'll be feeling much fresher (and cleaner too).  Brush and floss your teeth. Give your teeth a good brushing with a floss finish. It's a small task but it feels so good and you can check 'taking care of your health' off the list of things to do.  Do something small on your list. Most of [place] have some small thing we would like to get done (load of laundry, sew a button, email a friend). Doing one of these things will make you feel like you've accomplished something.  Drink water. Drinking water is easy right? It's also really beneficial for your health so keep a glass beside you all day and take sips often. It gives you energy and prevents you from boredom eating.  Do some floor exercises. The last thing you want to do is exercise but it might be just the thing you need the most. Keep it simple and do exercises that involve sitting or laying on the floor. Even the smallest of exercises release  chemicals in the brain that make you feel good. Yoga stretches or core exercises are going to make you feel good with minimal effort.  Make your bed. Making your bed takes a few minutes but it's productive and you'll feel relieved when it's done. An unmade bed is a huge visual reminder that you're having an unproductive day. Do it and consider it your housework for the day.  Put on some nice clothes. Take the sweatpants off even if you don't plan to go anywhere. Put on clothes that make you feel good. Take a look in the mirror so your brain recognizes the sweatpants have been replaced with clothes that make you look great. It's an instant confidence  booster.  Wash the dishes. A pile of dirty dishes in the sink is a reflection of your mood. It's possible that if you wash up the dishes, your mood will follow suit. It's worth a try.  Cook a real meal. If you have the luxury to have a "do nothing" day, you have time to make a real meal for yourself. Make a meal that you love to eat. The process is good to get you out of the funk and the food will ensure you have more energy for tomorrow.  Write out your thoughts by hand. When you hand write, you stimulate your brain to focus on the moment that you're in so make yourself comfortable and write whatever comes into your mind. Put those thoughts out on paper so they stop spinning around in your head. Those thoughts might be the very thing holding you down.  Managing Loss, Adult People experience loss in many different ways throughout their lives. Events such as moving, changing jobs, and losing friends can create a sense of loss. The loss may be as serious as a major health change, divorce, death of a pet, or death of a loved one. All of these types of loss are likely to create a physical and emotional reaction known as grief. Grief is the result of a major change or an absence of something or someone that you count on. Grief is a normal reaction to  loss. A variety of factors can affect your grieving experience, including:  The nature of your loss.  Your relationship to what or whom you lost.  Your understanding of grief and how to manage it.  Your support system. How to manage lifestyle changes Keep to your normal routine as much as possible.  If you have trouble focusing or doing normal activities, it is acceptable to take some time away from your normal routine.  Spend time with friends and loved ones.  Eat a healthy diet, get plenty of sleep, and rest when you feel tired. How to recognize changes  The way that you deal with your grief will affect your ability to function as you normally do. When grieving, you may experience these changes:  Numbness, shock, sadness, anxiety, anger, denial, and guilt.  Thoughts about death.  Unexpected crying.  A physical sensation of emptiness in your stomach.  Problems sleeping and eating.  Tiredness (fatigue).  Loss of interest in normal activities.  Dreaming about or imagining seeing the person who died.  A need to remember what or whom you lost.  Difficulty thinking about anything other than your loss for a period of time.  Relief. If you have been expecting the loss for a while, you may feel a sense of relief when it happens. Follow these instructions at home:    Activity Express your feelings in healthy ways, such as:  Talking with others about your loss. It may be helpful to find others who have had a similar loss, such as a support group.  Writing down your feelings in a journal.  Doing physical activities to release stress and emotional energy.  Doing creative activities like painting, sculpting, or playing or listening to music.  Practicing resilience. This is the ability to recover and adjust after facing challenges. Reading some resources that encourage resilience may help you to learn ways to practice those behaviors. General instructions 1. Be patient  with yourself and others. Allow the grieving process to happen, and remember that grieving takes time. ? It is likely  that you may never feel completely done with some grief. You may find a way to move on while still cherishing memories and feelings about your loss. ? Accepting your loss is a process. It can take months or longer to adjust. 2. Keep all follow-up visits as told by your health care provider. This is important. Where to find support To get support for managing loss:  Ask your health care provider for help and recommendations, such as grief counseling or therapy.  Think about joining a support group for people who are managing a loss. Where to find more information You can find more information about managing loss from:  American Society of Clinical Oncology: www.cancer.net  American Psychological Association: TVStereos.ch Contact a health care provider if:  Your grief is extreme and keeps getting worse.  You have ongoing grief that does not improve.  Your body shows symptoms of grief, such as illness.  You feel depressed, anxious, or lonely. Get help right away if:  You have thoughts about hurting yourself or others. If you ever feel like you may hurt yourself or others, or have thoughts about taking your own life, get help right away. You can go to your nearest emergency department or call:  Your local emergency services (911 in the [place]  A suicide crisis helpline, such as the [place] at 458-499-8361. This is open 24 hours a day. Summary  Grief is the result of a major change or an absence of someone or something that you count on. Grief is a normal reaction to loss.  The depth of grief and the period of recovery depend on the type of loss and your ability to adjust to the change and process your feelings.  Processing grief requires patience and a willingness to accept your feelings and talk about your loss with people who are  supportive.  It is important to find resources that work for you and to realize that people experience grief differently. There is not one grieving process that works for everyone in the same way.  Be aware that when grief becomes extreme, it can lead to more severe issues like isolation, depression, anxiety, or suicidal thoughts. Talk with your health care provider if you have any of these issues. This information is not intended to replace advice given to you by your health care provider. Make sure you discuss any questions you have with your health care provider. Document Revised: 05/26/2018 Document Reviewed: 08/05/2016 Elsevier Patient Education  [place] for Managing Grief   When you are experiencing grief, many resources and support are available to help you. You are not alone.    Grief Share (https://www.https://jacobson-moore.net/):    [employer] [facility] [place], [place]  [phone], [place], [place], [place]   [place], [place]   [facility] [address] [place], [place]   [place], [place]   If a Hospice or [place] team has been involved in the care of your loved one, you may reach out to your contact there or locally, you may reach out  to [facility]   [place], [place] [address] [phone]    Task: Facilitate Engagement in [place]   Note:   Care Management Activities:    - attendance at mental health appointments reviewed - barriers to treatment reviewed and addressed - risk of unmanaged depression  discussed    Notes:       Follow Up Plan: SW will follow up with patient by phone over the next quarter      Eula Fried, BSW, [email or web address], [email or web address] Phone: [phone]

## 2020-05-08 ENCOUNTER — Telehealth (INDEPENDENT_AMBULATORY_CARE_PROVIDER_SITE_OTHER): Payer: BC Managed Care – PPO | Admitting: Family Medicine

## 2020-05-08 ENCOUNTER — Encounter: Payer: Self-pay | Admitting: Family Medicine

## 2020-05-08 VITALS — BP 121/86 | HR 83 | Temp 97.8°F

## 2020-05-08 DIAGNOSIS — F339 Major depressive disorder, recurrent, unspecified: Secondary | ICD-10-CM

## 2020-05-08 NOTE — Progress Notes (Signed)
BP 121/86   Pulse 83   Temp 97.8 F (36.6 C)    Subjective:    Patient ID: Morgan Cisneros, female    DOB: 1965-07-03, 55 y.o.   MRN: [id number]  HPI: Morgan Cisneros is a 55 y.o. female  Chief Complaint  Patient presents with  . Depression    Follow up    DEPRESSION Mood status: better Satisfied with current treatment?: yes Symptom severity: moderate  Duration of current treatment : chronic Side effects: no Medication compliance: excellent compliance Psychotherapy/counseling: no  Previous psychiatric medications: cymbalta Depressed mood: yes Anxious mood: no Anhedonia: no Significant weight loss or gain: no Insomnia: no  Fatigue: yes Feelings of worthlessness or guilt: no Impaired concentration/indecisiveness: no Suicidal ideations: no Hopelessness: no Crying spells: no Depression screen [facility] 2/9 05/08/2020 04/17/2020 03/31/2020 02/21/2020 01/31/2020  Decreased Interest 2 3 3 3 3   Down, Depressed, Hopeless 3 3 2 3 3   PHQ - 2 Score 5 6 5 6 6   Altered sleeping 2 3 3  0 3  Tired, decreased energy 2 3 3 3 3   Change in appetite 0 3 2 3 3   Feeling bad or failure about yourself  2 3 1  0 3  Trouble concentrating 2 3 3 3 3   Moving slowly or fidgety/restless 0 0 0 0 0  Suicidal thoughts 0 0 0 0 0  PHQ-9 Score 13 21 17 15 21   Difficult doing work/chores Not difficult at all Extremely dIfficult Somewhat difficult Not difficult at all Very difficult  Some recent data might be hidden    Relevant past medical, surgical, family and social history reviewed and updated as indicated. Interim medical history since our last visit reviewed. Allergies and medications reviewed and updated.  Review of Systems  Constitutional: Negative.   Respiratory: Negative.   Cardiovascular: Negative.   Gastrointestinal: Negative.   Musculoskeletal: Negative.   Skin: Negative.   Psychiatric/Behavioral: Positive for dysphoric mood. Negative for agitation, behavioral problems, confusion,  decreased concentration, hallucinations, self-injury, sleep disturbance and suicidal ideas. The patient is nervous/anxious. The patient is not hyperactive.     Per HPI unless specifically indicated above     Objective:    BP 121/86   Pulse 83   Temp 97.8 F (36.6 C)   Wt Readings from Last 3 Encounters:  04/08/20 128 lb 3.2 oz (58.2 kg)  01/31/20 148 lb (67.1 kg)  01/18/20 148 lb 12.8 oz (67.5 kg)    Physical Exam Vitals and nursing note reviewed.  Constitutional:      General: She is not in acute distress.    Appearance: Normal appearance. She is not ill-appearing, toxic-appearing or diaphoretic.  HENT:     Head: Normocephalic and atraumatic.     Right Ear: External ear normal.     Left Ear: External ear normal.     Nose: Nose normal.     Mouth/Throat:     Mouth: Mucous membranes are moist.     Pharynx: Oropharynx is clear.  Eyes:     General: No scleral icterus.       Right eye: No discharge.        Left eye: No discharge.     Conjunctiva/sclera: Conjunctivae normal.     Pupils: Pupils are equal, round, and reactive to light.  Pulmonary:     Effort: Pulmonary effort is normal. No respiratory distress.     Comments: Speaking in full sentences Musculoskeletal:        General: Normal range of motion.  Cervical back: Normal range of motion.  Skin:    Coloration: Skin is not jaundiced or pale.     Findings: No bruising, erythema, lesion or rash.  Neurological:     Mental Status: She is alert and oriented to person, place, and time. Mental status is at baseline.  Psychiatric:        Mood and Affect: Mood normal.        Behavior: Behavior normal.        Thought Content: Thought content normal.        Judgment: Judgment normal.     Results for orders placed or performed in visit on 04/08/20  Novel Coronavirus, NAA ([facility])   Specimen: Saline  Result Value Ref Range   SARS-CoV-2, NAA Not Detected Not Detected  SARS-COV-2, NAA 2 DAY TAT  Result Value Ref Range    SARS-CoV-2, NAA 2 DAY TAT Performed       Assessment & Plan:   Problem List Items Addressed This Visit      Other   Depression, recurrent ([place]) - Primary    Doing much better on current regimen. Feeling stable. Continue current regimen. Continue to monitor. Call with any concerns.           Follow up plan: Return in about 3 months (around 08/05/2020).     . This visit was completed via MyChart due to the restrictions of the COVID-19 pandemic. All issues as above were discussed and addressed. Physical exam was done as above through visual confirmation on MyChart. If it was felt that the patient should be evaluated in the office, they were directed there. The patient verbally consented to this visit. . Location of the patient: home . Location of the provider: work . Those involved with this call:  . Provider: Park Liter, DO . CMA: Louanna Raw, [place] . Front Desk/Registration: Jill Side  . Time spent on call: 15 minutes with patient face to face via video conference. More than 50% of this time was spent in counseling and coordination of care. 23 minutes total spent in review of patient's record and preparation of their chart.

## 2020-05-09 ENCOUNTER — Encounter: Payer: Self-pay | Admitting: Family Medicine

## 2020-05-09 NOTE — Assessment & Plan Note (Signed)
Doing much better on current regimen. Feeling stable. Continue current regimen. Continue to monitor. Call with any concerns.

## 2020-05-13 ENCOUNTER — Telehealth: Payer: Self-pay

## 2020-05-13 NOTE — Telephone Encounter (Signed)
OK to be in person

## 2020-05-13 NOTE — Telephone Encounter (Signed)
Called pt she states that she is having a sore throat and ear pain. She states that she has been seen virtually and has taken 2 rounds of antibiotics and has been on prednisone. She is wanting to come in to the office instead of doing a virtually visit. Please advise.

## 2020-05-14 ENCOUNTER — Other Ambulatory Visit: Payer: Self-pay | Admitting: Family Medicine

## 2020-05-14 ENCOUNTER — Ambulatory Visit (INDEPENDENT_AMBULATORY_CARE_PROVIDER_SITE_OTHER): Payer: BC Managed Care – PPO

## 2020-05-14 ENCOUNTER — Other Ambulatory Visit: Payer: Self-pay

## 2020-05-14 ENCOUNTER — Ambulatory Visit
Admission: RE | Admit: 2020-05-14 | Discharge: 2020-05-14 | Disposition: A | Discharge status: Home / Self Care | Payer: BC Managed Care – PPO | Source: Ambulatory Visit | Attending: Family Medicine | Admitting: Family Medicine

## 2020-05-14 VITALS — BP 120/95 | HR 74 | Temp 98.1°F | Resp 18 | Ht 65.0 in | Wt 128.3 lb

## 2020-05-14 DIAGNOSIS — R059 Cough, unspecified: Secondary | ICD-10-CM | POA: Diagnosis not present

## 2020-05-14 DIAGNOSIS — J40 Bronchitis, not specified as acute or chronic: Secondary | ICD-10-CM

## 2020-05-14 MED ORDER — BENZONATATE 200 MG PO CAPS: 200.0000 mg | ORAL_CAPSULE | Freq: Three times a day (TID) | ORAL | 0 refills | Status: DC | PRN

## 2020-05-14 MED ORDER — AZITHROMYCIN 250 MG PO TABS: ORAL_TABLET | ORAL | 0 refills | Status: DC

## 2020-05-14 NOTE — ED Provider Notes (Signed)
- [facility]    CSN: [id number] Arrival date & time: 05/14/20  0951      History   Chief Complaint Chief Complaint  Patient presents with  . Cough   HPI  55 year old female presents with respiratory symptoms.  Patient states that she has been sick for over a month.  She has been treated for sinusitis with Augmentin x2.  She has also been placed on prednisone.  Patient states that some of her symptoms have improved but she continues to have cough, sore throat, and ear pain.  No fever.  Her cough is productive.  She is a smoker.  Denies shortness of breath.  No relieving factors.  No other reported symptoms.  No other complaints.  Past Medical History:  Diagnosis Date  . Abdominal pain   . Acute gastritis without hemorrhage   . 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 Appendicitis 02/01/2017  . C. difficile colitis 10/2016  . Diverticulitis 10/2016  . Diverticulitis of large intestine without perforation or abscess without bleeding   . Flank pain   . Gross hematuria   . History of kidney stones   . Hypertension   . Inflammation of sacroiliac joint ([place]) 10/28/2016  . Insomnia   . LBP (low back pain) 12/13/2014  . Ovarian cyst   . Renal calculus, right   . Renal calculus, right   . Thoracic back pain    Right sided    Patient Active Problem List   Diagnosis Date Noted  . Dupuytren's contracture 01/04/2019  . Essential hypertension 06/29/2018  . Depression, recurrent ([place]) 06/29/2018  . Benign neoplasm of ascending colon   . Anxiety 04/18/2017  . Spasm of back muscles 10/28/2016  . Diverticulitis 10/19/2016  . GERD (gastroesophageal reflux disease) 04/18/2015  . Menopausal symptoms 04/18/2015  . Insomnia   . Family history of colon cancer in mother 01/15/2015  . Family history of ovarian cancer 01/15/2015  . Tobacco abuse 01/15/2015  . Lymphadenopathy, axillary 01/15/2015  . Calculus of kidney 12/13/2014  . Female genuine stress incontinence 12/13/2014    Past  Surgical History:  Procedure Laterality Date  . ABDOMINAL HYSTERECTOMY  08/2013  . ABDOMINAL HYSTERECTOMY    . BREAST BIOPSY Left 08/10/2016   [place] core, PASH  . BREAST BIOPSY Right 01/17/2020   no clip due to pt condition during biopsy-neg  . BREAST BIOPSY Right 02/07/2020   re-biopsy due to prior biopsy/ coil clip/ path pending  . COLONOSCOPY WITH PROPOFOL N/A 04/25/2017   Procedure: COLONOSCOPY WITH PROPOFOL;  Surgeon: Lucilla Lame, MD;  Location: [place];  Service: Endoscopy;  Laterality: N/A;  . CYSTOSCOPY  11/26/2010   with stent placement  . ESOPHAGOGASTRODUODENOSCOPY (EGD) WITH PROPOFOL N/A 04/25/2017   Procedure: ESOPHAGOGASTRODUODENOSCOPY (EGD) WITH PROPOFOL;  Surgeon: Lucilla Lame, MD;  Location: [place];  Service: Endoscopy;  Laterality: N/A;  . HEMORRHOID SURGERY    . kidney stent    . LAPAROSCOPIC APPENDECTOMY N/A 02/02/2017   Procedure: APPENDECTOMY LAPAROSCOPIC;  Surgeon: Clayburn Pert, MD;  Location: [facility];  Service: General;  Laterality: N/A;  . LAPAROSCOPIC SIGMOID COLECTOMY N/A 11/01/2016   Procedure: LAPAROSCOPIC SIGMOID COLECTOMY;  Surgeon: Clayburn Pert, MD;  Location: [facility];  Service: General;  Laterality: N/A;  . LITHOTRIPSY     X 4  . POLYPECTOMY  04/25/2017   Procedure: POLYPECTOMY INTESTINAL;  Surgeon: Lucilla Lame, MD;  Location: [place];  Service: Endoscopy;;  . TONSILLECTOMY    . TUBAL LIGATION    .  Ureteroscopy Right 11/21/1997   with holmium laser    OB History   No obstetric history on file.      Home Medications    Prior to Admission medications   Medication Sig Start Date End Date Taking? Authorizing Provider  azithromycin (ZITHROMAX) 250 MG tablet 2 tablets on day 1, then 1 tablet daily on days 2-5. 05/14/20  Yes Tonjia Parillo G, DO  benzonatate (TESSALON) 200 MG capsule Take 1 capsule (200 mg total) by mouth 3 (three) times daily as needed for cough. 05/14/20  Yes See Beharry G, DO  DULoxetine  (CYMBALTA) 60 MG capsule TAKE 1 CAPSULE(60 MG) BY MOUTH DAILY 05/14/20  Yes Johnson, Megan P, DO  LORazepam (ATIVAN) 0.5 MG tablet Take 1-2 tablets (0.5-1 mg total) by mouth 2 (two) times daily as needed for anxiety. 04/17/20  Yes Johnson, Megan P, DO  metoprolol succinate (TOPROL-XL) 100 MG 24 hr tablet Take 1 tablet (100 mg total) by mouth daily. 01/18/20  Yes Johnson, Megan P, DO  zolpidem (AMBIEN) 10 MG tablet Take 1 tablet (10 mg total) by mouth at bedtime as needed for sleep. 04/08/20 05/08/20 Yes Valerie Roys, DO    Family History Family History  Problem Relation Age of Onset  . Kidney Stones Mother   . Ovarian cancer Mother   . Colon cancer Mother   . Lung cancer Father   . Hyperlipidemia Sister   . Hypertension Sister   . Alzheimer's disease Maternal Grandmother   . Cancer Maternal Grandfather        Liver  . Alzheimer's disease Paternal Grandfather   . Hypertension Sister   . Hyperlipidemia Sister   . Breast cancer Neg Hx     Social History Social History   Tobacco Use  . Smoking status: Current Every Day Smoker    Packs/day: 0.25    Types: Cigarettes  . Smokeless tobacco: Never Used  Vaping Use  . Vaping Use: Never used  Substance Use Topics  . Alcohol use: Yes    Alcohol/week: 0.0 standard drinks    Comment: occasional  . Drug use: No     Allergies   Wellbutrin [bupropion] and Urocit-k [potassium citrate]   Review of Systems Review of Systems Per HPI  Physical Exam Triage Vital Signs ED Triage Vitals  Enc Vitals Group     BP 05/14/20 1012 (!) 120/95     Pulse Rate 05/14/20 1012 74     Resp 05/14/20 1012 18     Temp 05/14/20 1012 98.1 F (36.7 C)     Temp Source 05/14/20 1012 Oral     SpO2 05/14/20 1012 100 %     Weight 05/14/20 1010 128 lb 4.9 oz (58.2 kg)     Height 05/14/20 1010 5\' 5"  (1.651 m)     Head Circumference --      Peak Flow --      Pain Score 05/14/20 1010 5     Pain Loc --      Pain Edu? --      Excl. in [place]? --    No data  found.  Updated Vital Signs BP (!) 120/95 (BP Location: Right Arm)   Pulse 74   Temp 98.1 F (36.7 C) (Oral)   Resp 18   Ht 5\' 5"  (1.651 m)   Wt 58.2 kg   SpO2 100%   BMI 21.35 kg/m   Visual Acuity Right Eye Distance:   Left Eye Distance:   Bilateral Distance:  Right Eye Near:   Left Eye Near:    Bilateral Near:     Physical Exam Vitals and nursing note reviewed.  Constitutional:      General: She is not in acute distress.    Appearance: Normal appearance. She is not ill-appearing.  HENT:     Head: Normocephalic and atraumatic.     Right Ear: Tympanic membrane normal.     Left Ear: Tympanic membrane normal.     Mouth/Throat:     Pharynx: Oropharynx is clear. No oropharyngeal exudate or posterior oropharyngeal erythema.  Eyes:     General:        Right eye: No discharge.        Left eye: No discharge.     Conjunctiva/sclera: Conjunctivae normal.  Cardiovascular:     Rate and Rhythm: Normal rate and regular rhythm.  Pulmonary:     Effort: Pulmonary effort is normal.     Breath sounds: Normal breath sounds. No wheezing, rhonchi or rales.  Neurological:     Mental Status: She is alert.  Psychiatric:        Mood and Affect: Mood normal.        Behavior: Behavior normal.    UC Treatments / Results  Labs (all labs ordered are listed, but only abnormal results are displayed) Labs Reviewed - No data to display  EKG   Radiology DG Chest 2 View  Result Date: 05/14/2020 CLINICAL DATA:  Cough. EXAM: CHEST - 2 VIEW COMPARISON:  January 15, 2015. FINDINGS: The heart size and mediastinal contours are within normal limits. Both lungs are clear. The visualized skeletal structures are unremarkable. IMPRESSION: No active cardiopulmonary disease. Electronically Signed   By: Marijo Conception M.D.   On: 05/14/2020 10:47    Procedures Procedures (including critical care time)  Medications Ordered in UC Medications - No data to display  Initial Impression / Assessment  and Plan / UC Course  I have reviewed the triage vital signs and the nursing notes.  Pertinent labs & imaging results that were available during my care of the patient were reviewed by me and considered in my medical decision making (see chart for details).    55 year old female presents with bronchitis.  Chest x-ray was obtained was intimately reviewed by me.  Interpretation: No acute cardiopulmonary abnormalities.  No evidence of pneumonia.  Given duration of symptoms, tobacco abuse, and lack of improvement, I am placing her on azithromycin to cover for atypical bacteria.  Tessalon Perles for cough.  If symptoms persist, her primary needs to refer her to pulmonology or ENT.  Final Clinical Impressions(s) / UC Diagnoses   Final diagnoses:  Bronchitis   Discharge Instructions   None    ED Prescriptions    Medication Sig Dispense Auth. Provider   azithromycin (ZITHROMAX) 250 MG tablet 2 tablets on day 1, then 1 tablet daily on days 2-5. 6 tablet Jafar Poffenberger G, DO   benzonatate (TESSALON) 200 MG capsule Take 1 capsule (200 mg total) by mouth 3 (three) times daily as needed for cough. 30 capsule Coral Spikes, DO     PDMP not reviewed this encounter.   Coral Spikes, [place] 05/14/20 1135

## 2020-05-14 NOTE — ED Triage Notes (Signed)
Pt c/o cough, bilateral ear pain and sore throat. Started about 4 weeks ago. She has been treated with Augmentin and steroids and has not gotten better.

## 2020-05-14 NOTE — Telephone Encounter (Signed)
Called pt she states that has an appt today at [facility] urgent care

## 2020-05-20 ENCOUNTER — Other Ambulatory Visit: Payer: Self-pay | Admitting: Family Medicine

## 2020-05-20 MED ORDER — METOPROLOL SUCCINATE ER 100 MG PO TB24
100.0000 mg | ORAL_TABLET | Freq: Every day | ORAL | 0 refills | Status: DC
Start: 2020-05-20 — End: 2020-07-10

## 2020-05-20 NOTE — Telephone Encounter (Signed)
Medication: metoprolol succinate (TOPROL-XL) 100 MG 24 hr tablet  Has the pt contacted their pharmacy? This is the pharmacy  Preferred pharmacy: [place], [place].   Please be advised refills may take up to 3 business days.  We ask that you follow up with your pharmacy.

## 2020-05-21 ENCOUNTER — Ambulatory Visit: Payer: BC Managed Care – PPO | Admitting: Family Medicine

## 2020-05-31 ENCOUNTER — Other Ambulatory Visit: Payer: Self-pay | Admitting: Family Medicine

## 2020-05-31 NOTE — Telephone Encounter (Signed)
Requested medication (s) are due for refill today: yes  Requested medication (s) are on the active medication list: yes  Last refill:  04/17/20  Future visit scheduled: yes  Notes to clinic:  med not delegated to NT to RF   Requested Prescriptions  Pending Prescriptions Disp Refills   LORazepam (ATIVAN) 0.5 MG tablet [Pharmacy Med Name: LORAZEPAM 0.5 MG TAB] 60 tablet     Sig: TAKE 1-2 TABLETS BY MOUTH TWICE DAILY ASNEEDED FOR ANXIETY      Not Delegated - Psychiatry:  Anxiolytics/Hypnotics Failed - 05/31/2020 10:21 AM      Failed - This refill cannot be delegated      Failed - Urine Drug Screen completed in last 360 days      Passed - Valid encounter within last 6 months    Recent Outpatient Visits           3 weeks ago Depression, recurrent ([place])   [place], Megan P, DO   1 month ago Acute non-recurrent maxillary sinusitis   [facility] Kathrine Haddock, NP   1 month ago Depression, recurrent [facility])   [place], Megan P, DO   1 month ago Acute non-recurrent maxillary sinusitis   [place], DO   2 months ago [place], [place], DO       Future Appointments             In 1 month Johnson, Barb Merino, DO [facility], [place]   In 1 month [place], Megan P, DO [facility], [facility]

## 2020-06-03 ENCOUNTER — Encounter: Payer: Self-pay | Admitting: Family Medicine

## 2020-06-16 ENCOUNTER — Other Ambulatory Visit: Payer: Self-pay | Admitting: Nurse Practitioner

## 2020-06-16 ENCOUNTER — Encounter: Payer: Self-pay | Admitting: Family Medicine

## 2020-06-16 MED ORDER — DULOXETINE HCL 60 MG PO CPEP
ORAL_CAPSULE | ORAL | 0 refills | Status: DC
Start: 2020-06-16 — End: 2020-07-10

## 2020-06-19 ENCOUNTER — Other Ambulatory Visit: Payer: Self-pay | Admitting: Family Medicine

## 2020-06-19 ENCOUNTER — Other Ambulatory Visit: Payer: Self-pay | Admitting: Nurse Practitioner

## 2020-06-19 ENCOUNTER — Telehealth: Payer: Self-pay

## 2020-06-19 NOTE — Telephone Encounter (Signed)
Please schedule appointment for future refills

## 2020-06-19 NOTE — Telephone Encounter (Signed)
Needs appointment for future refills.

## 2020-06-19 NOTE — Telephone Encounter (Signed)
Requested medication (s) are due for refill today: yes  Requested medication (s) are on the active medication list: yes  Last refill:  05/21/2020  Future visit scheduled:  yes  Notes to clinic:  this refill cannot be delegated    Requested Prescriptions  Pending Prescriptions Disp Refills   zolpidem (AMBIEN) 10 MG tablet [Pharmacy Med Name: ZOLPIDEM TARTRATE 10 MG TAB] 30 tablet     Sig: TAKE 1 TABLET BY MOUTH AT BEDTIME FOR SLEEP      Not Delegated - Psychiatry:  Anxiolytics/Hypnotics Failed - 06/19/2020  2:03 PM      Failed - This refill cannot be delegated      Failed - Urine Drug Screen completed in last 360 days      Passed - Valid encounter within last 6 months    Recent Outpatient Visits           1 month ago Depression, recurrent ([place])   [place], Megan P, DO   1 month ago Acute non-recurrent maxillary sinusitis   [facility] Kathrine Haddock, NP   2 months ago Depression, recurrent [facility])   [place], Megan P, DO   2 months ago Acute non-recurrent maxillary sinusitis   [place], DO   2 months ago [place], [place], DO       Future Appointments             In 3 weeks Wyn[place] Emery, Barb Merino, DO [facility], [place]   In 1 month [place], Barb Merino, DO [facility], [facility]

## 2020-06-19 NOTE — Telephone Encounter (Signed)
Pt is scheduled 4/7

## 2020-06-24 ENCOUNTER — Encounter: Payer: Self-pay | Admitting: Family Medicine

## 2020-06-25 MED ORDER — LORAZEPAM 0.5 MG PO TABS: 0.5000 mg | ORAL_TABLET | Freq: Two times a day (BID) | ORAL | 0 refills | Status: DC | PRN

## 2020-06-25 NOTE — Telephone Encounter (Signed)
My Controlled substance is down- waiting on a ticket from IT- can you please call in lorazepam to [facility]?

## 2020-07-10 ENCOUNTER — Ambulatory Visit (INDEPENDENT_AMBULATORY_CARE_PROVIDER_SITE_OTHER): Payer: BC Managed Care – PPO | Admitting: Family Medicine

## 2020-07-10 ENCOUNTER — Encounter: Payer: Self-pay | Admitting: Family Medicine

## 2020-07-10 ENCOUNTER — Other Ambulatory Visit: Payer: Self-pay

## 2020-07-10 DIAGNOSIS — F339 Major depressive disorder, recurrent, unspecified: Secondary | ICD-10-CM | POA: Diagnosis not present

## 2020-07-10 DIAGNOSIS — G4700 Insomnia, unspecified: Secondary | ICD-10-CM

## 2020-07-10 DIAGNOSIS — F419 Anxiety disorder, unspecified: Secondary | ICD-10-CM

## 2020-07-10 DIAGNOSIS — I10 Essential (primary) hypertension: Secondary | ICD-10-CM

## 2020-07-10 MED ORDER — LORAZEPAM 0.5 MG PO TABS: 0.5000 mg | ORAL_TABLET | Freq: Two times a day (BID) | ORAL | 2 refills | Status: DC | PRN

## 2020-07-10 MED ORDER — DULOXETINE HCL 60 MG PO CPEP
ORAL_CAPSULE | ORAL | 1 refills | Status: DC
Start: 2020-07-10 — End: 2020-10-30

## 2020-07-10 MED ORDER — ZOLPIDEM TARTRATE 10 MG PO TABS
ORAL_TABLET | ORAL | 5 refills | Status: DC
Start: 2020-07-10 — End: 2020-10-30

## 2020-07-10 MED ORDER — METOPROLOL SUCCINATE ER 100 MG PO TB24
100.0000 mg | ORAL_TABLET | Freq: Every day | ORAL | 1 refills | Status: DC
Start: 2020-07-10 — End: 2020-08-03

## 2020-07-10 NOTE — Progress Notes (Signed)
BP 123/86   Pulse 80   Temp 98.2 F (36.8 C) (Oral)   Ht 5' 5.16" (1.655 m)   Wt 157 lb 8 oz (71.4 kg)   SpO2 97%   BMI 26.08 kg/m    Subjective:    Patient ID: Morgan Cisneros, female    DOB: 03-15-1966, 55 y.o.   MRN: [id number]  HPI: Morgan Cisneros is a 55 y.o. female  Chief Complaint  Patient presents with  . Anxiety  . Depression   ANXIETY/DEPRESSION Duration: Chronic Status:better Anxious mood: yes  Excessive worrying: yes Irritability: no  Sweating: no Nausea: no Palpitations:no Hyperventilation: no Panic attacks: yes Agoraphobia: no  Obscessions/compulsions: no Depressed mood: yes Depression screen [facility] 2/9 07/10/2020 05/08/2020 04/17/2020 03/31/2020 02/21/2020  Decreased Interest 1 2 3 3 3   Down, Depressed, Hopeless 2 3 3 2 3   PHQ - 2 Score 3 5 6 5 6   Altered sleeping 2 2 3 3  0  Tired, decreased energy 3 2 3 3 3   Change in appetite - 0 3 2 3   Feeling bad or failure about yourself  2 2 3 1  0  Trouble concentrating 2 2 3 3 3   Moving slowly or fidgety/restless 0 0 0 0 0  Suicidal thoughts 0 0 0 0 0  PHQ-9 Score 12 13 21 17 15   Difficult doing work/chores Not difficult at all Not difficult at all Extremely dIfficult Somewhat difficult Not difficult at all  Some recent data might be hidden   Anhedonia: no Weight changes: no Insomnia: no   Hypersomnia: no Fatigue/loss of energy: yes Feelings of worthlessness: yes Feelings of guilt: no Impaired concentration/indecisiveness: no Suicidal ideations: no  Crying spells: no Recent Stressors/Life Changes: no   Relationship problems: no   Family stress: no     Financial stress: no    Job stress: no    Recent death/loss: no  INSOMNIA Duration: chronic Satisfied with sleep quality: yes Difficulty falling asleep: yes Difficulty staying asleep: yes Waking a few hours after sleep onset: no Early morning awakenings: no Daytime hypersomnolence: no Wakes feeling refreshed: yes Good sleep hygiene:  yes Apnea: no Snoring: no Depressed/anxious mood: yes Recent stress: yes Restless legs/nocturnal leg cramps: no Chronic pain/arthritis: no History of sleep study: no Treatments attempted: melatonin, uinsom, benadryl and ambien   HYPERTENSION Hypertension status: controlled  Satisfied with current treatment? yes Duration of hypertension: chronic BP monitoring frequency:  not checking BP medication side effects:  no Medication compliance: excellent compliance Previous BP meds: metoprolol Aspirin: no Recurrent headaches: no Visual changes: no Palpitations: no Dyspnea: no Chest pain: no Lower extremity edema: no Dizzy/lightheaded: no  Relevant past medical, surgical, family and social history reviewed and updated as indicated. Interim medical history since our last visit reviewed. Allergies and medications reviewed and updated.  Review of Systems  Constitutional: Negative.   Respiratory: Negative.   Cardiovascular: Negative.   Gastrointestinal: Negative.   Musculoskeletal: Negative.   Psychiatric/Behavioral: Positive for dysphoric mood. Negative for agitation, behavioral problems, confusion, decreased concentration, hallucinations, self-injury, sleep disturbance and suicidal ideas. The patient is nervous/anxious. The patient is not hyperactive.     Per HPI unless specifically indicated above     Objective:    BP 123/86   Pulse 80   Temp 98.2 F (36.8 C) (Oral)   Ht 5' 5.16" (1.655 m)   Wt 157 lb 8 oz (71.4 kg)   SpO2 97%   BMI 26.08 kg/m   Wt Readings from Last 3 Encounters:  07/10/20 157 lb 8 oz (71.4 kg)  05/14/20 128 lb 4.9 oz (58.2 kg)  04/08/20 128 lb 3.2 oz (58.2 kg)    Physical Exam Vitals and nursing note reviewed.  Constitutional:      General: She is not in acute distress.    Appearance: Normal appearance. She is not ill-appearing, toxic-appearing or diaphoretic.  HENT:     Head: Normocephalic and atraumatic.     Right Ear: External ear normal.      Left Ear: External ear normal.     Nose: Nose normal.     Mouth/Throat:     Mouth: Mucous membranes are moist.     Pharynx: Oropharynx is clear.  Eyes:     General: No scleral icterus.       Right eye: No discharge.        Left eye: No discharge.     Extraocular Movements: Extraocular movements intact.     Conjunctiva/sclera: Conjunctivae normal.     Pupils: Pupils are equal, round, and reactive to light.  Cardiovascular:     Rate and Rhythm: Normal rate and regular rhythm.     Pulses: Normal pulses.     Heart sounds: Normal heart sounds. No murmur heard. No friction rub. No gallop.   Pulmonary:     Effort: Pulmonary effort is normal. No respiratory distress.     Breath sounds: Normal breath sounds. No stridor. No wheezing, rhonchi or rales.  Chest:     Chest wall: No tenderness.  Musculoskeletal:        General: Normal range of motion.     Cervical back: Normal range of motion and neck supple.  Skin:    General: Skin is warm and dry.     Capillary Refill: Capillary refill takes less than 2 seconds.     Coloration: Skin is not jaundiced or pale.     Findings: No bruising, erythema, lesion or rash.  Neurological:     General: No focal deficit present.     Mental Status: She is alert and oriented to person, place, and time. Mental status is at baseline.  Psychiatric:        Mood and Affect: Mood normal.        Behavior: Behavior normal.        Thought Content: Thought content normal.        Judgment: Judgment normal.     Results for orders placed or performed in visit on 04/08/20  Novel Coronavirus, NAA ([facility])   Specimen: Saline  Result Value Ref Range   SARS-CoV-2, NAA Not Detected Not Detected  SARS-COV-2, NAA 2 DAY TAT  Result Value Ref Range   SARS-CoV-2, NAA 2 DAY TAT Performed       Assessment & Plan:   Problem List Items Addressed This Visit      Cardiovascular and Mediastinum   Essential hypertension    Under good control on current regimen.  Continue current regimen. Continue to monitor. Call with any concerns. Refills given. Labs drawn today.       Relevant Medications   metoprolol succinate (TOPROL-XL) 100 MG 24 hr tablet     Other   Insomnia    Under good control on current regimen. Continue current regimen. Continue to monitor. Call with any concerns. Refills given for 6 months. Follow up on this in 6 months at physical.       Anxiety    Doing a bit better. Will keep her on her lorazepam for now and let  her decrease slowly on her own. Continue with grief counseling. Recheck 3 months. Call with any concerns.      Relevant Medications   LORazepam (ATIVAN) 0.5 MG tablet   DULoxetine (CYMBALTA) 60 MG capsule   Depression, recurrent ([facility])    Doing a bit better. Will keep her on her lorazepam for now and let her decrease slowly on her own. Continue with grief counseling. Recheck 3 months. Call with any concerns.      Relevant Medications   LORazepam (ATIVAN) 0.5 MG tablet   DULoxetine (CYMBALTA) 60 MG capsule       Follow up plan: Return in about 3 months (around 10/09/2020) for physical.

## 2020-07-11 ENCOUNTER — Encounter: Payer: Self-pay | Admitting: Family Medicine

## 2020-07-11 NOTE — Assessment & Plan Note (Signed)
Doing a bit better. Will keep her on her lorazepam for now and let her decrease slowly on her own. Continue with grief counseling. Recheck 3 months. Call with any concerns.

## 2020-07-11 NOTE — Assessment & Plan Note (Signed)
Under good control on current regimen. Continue current regimen. Continue to monitor. Call with any concerns. Refills given. Labs drawn today.   

## 2020-07-11 NOTE — Assessment & Plan Note (Addendum)
Under good control on current regimen. Continue current regimen. Continue to monitor. Call with any concerns. Refills given for 6 months. Follow up on this in 6 months.

## 2020-07-15 ENCOUNTER — Other Ambulatory Visit: Payer: Self-pay | Admitting: Family Medicine

## 2020-07-15 DIAGNOSIS — Z1231 Encounter for screening mammogram for malignant neoplasm of breast: Secondary | ICD-10-CM

## 2020-07-25 ENCOUNTER — Telehealth: Payer: Self-pay

## 2020-07-29 ENCOUNTER — Encounter: Payer: BC Managed Care – PPO | Admitting: Family Medicine

## 2020-08-03 ENCOUNTER — Other Ambulatory Visit: Payer: Self-pay | Admitting: Family Medicine

## 2020-08-03 NOTE — Telephone Encounter (Signed)
Change of pharmacy Requested Prescriptions  Pending Prescriptions Disp Refills  . metoprolol succinate (TOPROL-XL) 100 MG 24 hr tablet [Pharmacy Med Name: METOPROLOL ER SUCCINATE 100MG  TABS] 90 tablet 0    Sig: TAKE 1 TABLET(100 MG) BY MOUTH DAILY     Cardiovascular:  Beta Blockers Passed - 08/03/2020  3:11 AM      Passed - Last BP in normal range    BP Readings from Last 1 Encounters:  07/10/20 123/86         Passed - Last Heart Rate in normal range    Pulse Readings from Last 1 Encounters:  07/10/20 80         Passed - Valid encounter within last 6 months    Recent Outpatient Visits          3 weeks ago Essential hypertension   [place], Megan P, DO   2 months ago Depression, recurrent ([place])   [place], Megan P, DO   3 months ago Acute non-recurrent maxillary sinusitis   [facility] Kathrine Haddock, NP   3 months ago Depression, recurrent [facility])   [place], Megan P, DO   3 months ago Acute non-recurrent maxillary sinusitis   [place], DO      Future Appointments            In 2 months Wynetta Emery, Barb Merino, DO [facility]

## 2020-08-08 ENCOUNTER — Telehealth: Payer: BC Managed Care – PPO | Admitting: Physician Assistant

## 2020-08-08 DIAGNOSIS — J0100 Acute maxillary sinusitis, unspecified: Secondary | ICD-10-CM

## 2020-08-08 MED ORDER — AMOXICILLIN-POT CLAVULANATE 875-125 MG PO TABS: 1.0000 | ORAL_TABLET | Freq: Two times a day (BID) | ORAL | 0 refills | Status: DC

## 2020-08-08 NOTE — Progress Notes (Signed)

## 2020-08-08 NOTE — Progress Notes (Signed)
I have spent 5 minutes in review of e-visit questionnaire, review and updating patient chart, medical decision making and response to patient.   Schylar Wuebker Cody Doniesha Landau, PA-C    

## 2020-08-14 ENCOUNTER — Ambulatory Visit (INDEPENDENT_AMBULATORY_CARE_PROVIDER_SITE_OTHER): Payer: BC Managed Care – PPO | Admitting: Licensed Clinical Social Worker

## 2020-08-14 DIAGNOSIS — F339 Major depressive disorder, recurrent, unspecified: Secondary | ICD-10-CM | POA: Diagnosis not present

## 2020-08-14 DIAGNOSIS — F419 Anxiety disorder, unspecified: Secondary | ICD-10-CM

## 2020-08-18 NOTE — Chronic Care Management (AMB) (Signed)
Chronic Care Management    Clinical Social Work Note  08/18/2020 Name: Morgan Cisneros MRN: [id number] DOB: [birth year]  Morgan Cisneros is a 55 y.o. year old female who is a primary care patient of Valerie Roys, DO. The CCM team was consulted to assist the patient with chronic disease management and/or care coordination needs related to: Grief Counseling.   Engaged with patient by telephone for follow up visit in response to provider referral for social work chronic care management and care coordination services.   Consent to Services:  The patient was given information about Chronic Care Management services, agreed to services, and gave verbal consent prior to initiation of services.  Please see initial visit note for detailed documentation.   Patient agreed to services and consent obtained.   Assessment: Patient is engaged in conversation, continues to maintain positive progress with care plan goals. Reports management of symptoms of depression triggered by grief. Patient continues to receive strong support and utilize healthy coping skills. See Care Plan below for interventions and patient self-care actives. Recent life changes [place] Journey: Grief Recommendation: Patient may benefit from, and is in agreement to continued medication management and utilization of healthy coping skills.  Follow up Plan: Patient would like continued follow-up.  CCM LCSW will follow up with patient 11/13/20. Patient will call office if needed prior to next encounter.  SDOH (Social Determinants of Health) assessments and interventions performed:    Advanced Directives Status: Not addressed in this encounter.  CCM Care Plan  Allergies  Allergen Reactions  . Wellbutrin [Bupropion] Other (See Comments)    irritiability  . Urocit-K [Potassium Citrate] Nausea Only    Outpatient Encounter Medications as of 08/14/2020  Medication Sig  . amoxicillin-clavulanate (AUGMENTIN) 875-125 MG tablet Take 1  tablet by mouth 2 (two) times daily.  . DULoxetine (CYMBALTA) 60 MG capsule TAKE 1 CAPSULE(60 MG) BY MOUTH DAILY  . metoprolol succinate (TOPROL-XL) 100 MG 24 hr tablet TAKE 1 TABLET(100 MG) BY MOUTH DAILY  . zolpidem (AMBIEN) 10 MG tablet TAKE 1 TABLET BY MOUTH AT BEDTIME FOR SLEEP   No facility-administered encounter medications on file as of 08/14/2020.    Patient Active Problem List   Diagnosis Date Noted  . Dupuytren's contracture 01/04/2019  . Essential hypertension 06/29/2018  . Depression, recurrent ([place]) 06/29/2018  . Benign neoplasm of ascending colon   . Anxiety 04/18/2017  . Spasm of back muscles 10/28/2016  . Diverticulitis 10/19/2016  . GERD (gastroesophageal reflux disease) 04/18/2015  . Menopausal symptoms 04/18/2015  . Insomnia   . Family history of colon cancer in mother 01/15/2015  . Family history of ovarian cancer 01/15/2015  . Tobacco abuse 01/15/2015  . Lymphadenopathy, axillary 01/15/2015  . Calculus of kidney 12/13/2014  . Female genuine stress incontinence 12/13/2014    Conditions to be addressed/monitored: Depression; Mental Health Concerns   Care Plan : General Social Work (Adult)  Updates made by Rebekah Chesterfield, LCSW since 08/18/2020 12:00 AM    Problem: Response to Treatment (Depression)     Long-Range Goal: Response to Treatment Maximized   Start Date: 05/02/2020  This Visit's Progress: On track  Recent Progress: On track  Priority: Medium  Note:   Timeframe:  Long-Range Goal Priority:  Medium Start Date:  05/02/20                         Expected End Date:  12/03/20  Follow Up Date- 11/13/20   Current barriers:    Concerns for grief symptom management  . Lacks knowledge of where and how to connect for grief support . Needs Support, Education, and Care Coordination in order to meet unmet need.   Clinical Goal(s): Over the next 120 days, patient will work with SW to reduce or manage symptoms of grief until connected for  ongoing counseling.   Clinical Interventions:  . Assessed patient's previous treatment, needs, coping skills, current treatment, support system and barriers to care . Patient interviewed and appropriate assessments performed or reviewed: brief mental health assessment . Patient reports that March was difficult due to their wedding anniversary and spouse's birthday. Patient reports symptoms have been "leveling up a bit" Feels like she is doing okay. . Patient has been attending church service and continues to receive strong support from adult children who reside locally. States she is appreciative for Dr. Durenda Age support and encouragement . Discussed several options for long term counseling based on need and insurance. Patient denies wanting to participate in grief therapy at this time . Reviewed mental health medications with patient prescribed by PCP and discussed compliance  . Other interventions include: Grief Counseling  . Collaboration with PCP regarding development and update of comprehensive plan of care as evidenced by provider attestation and co-signature . Inter-disciplinary care team collaboration (see longitudinal plan of care)  Patient Goals: -Continue utilization of healthy coping strategies discussed -Attend all scheduled provider appointments -Comply with medications prescribed     Christa See, MSW, [email or web address] Phone [phone] 8:37 AM

## 2020-08-18 NOTE — Patient Instructions (Signed)
Visit Information  Goals Addressed            This Visit's Progress   . SW-Find Help in My Community   On track    Follow Up Date-11/13/20  Patient Goals: -Continue utilization of healthy coping strategies discussed -Attend all scheduled provider appointments -Comply with medications prescribed    . SW-Manage My Emotions   On track    Timeframe:  Long-Range Goal Priority:  Medium Start Date:  05/02/20                         Expected End Date:  12/03/20                    Follow Up Date- 3811/22   Patient Goals: -Continue utilization of healthy coping strategies discussed -Attend all scheduled provider appointments -Comply with medications prescribed       Patient verbalizes understanding of instructions provided today.   Telephone follow up appointment with care management team member scheduled for:11/13/20  Christa See, MSW, [email or web address] Phone [phone] 8:39 AM

## 2020-09-12 ENCOUNTER — Telehealth: Payer: Self-pay

## 2020-09-12 NOTE — Telephone Encounter (Signed)
PA submitted vis cover my meds for ambien. Will wait for approval or denial.  Key: [id number]

## 2020-09-15 ENCOUNTER — Encounter: Payer: Self-pay | Admitting: Family Medicine

## 2020-09-16 ENCOUNTER — Other Ambulatory Visit: Payer: Self-pay

## 2020-09-16 ENCOUNTER — Telehealth: Payer: BC Managed Care – PPO | Admitting: Nurse Practitioner

## 2020-09-30 ENCOUNTER — Other Ambulatory Visit: Payer: Self-pay | Admitting: Family Medicine

## 2020-09-30 ENCOUNTER — Encounter: Payer: Self-pay | Admitting: Family Medicine

## 2020-09-30 MED ORDER — LORAZEPAM 0.5 MG PO TABS: 0.5000 mg | ORAL_TABLET | Freq: Two times a day (BID) | ORAL | 0 refills | Status: DC | PRN

## 2020-09-30 NOTE — Telephone Encounter (Signed)
Pt is scheduled 7/28 

## 2020-10-28 ENCOUNTER — Other Ambulatory Visit: Payer: Self-pay | Admitting: Family Medicine

## 2020-10-28 ENCOUNTER — Encounter: Payer: Self-pay | Admitting: Family Medicine

## 2020-10-28 NOTE — Telephone Encounter (Signed)
Pt has apt on 10/30/2020

## 2020-10-30 ENCOUNTER — Other Ambulatory Visit: Payer: Self-pay

## 2020-10-30 ENCOUNTER — Ambulatory Visit (INDEPENDENT_AMBULATORY_CARE_PROVIDER_SITE_OTHER): Payer: BC Managed Care – PPO | Admitting: Family Medicine

## 2020-10-30 ENCOUNTER — Encounter: Payer: Self-pay | Admitting: Family Medicine

## 2020-10-30 VITALS — BP 127/82 | HR 71 | Temp 98.4°F | Ht 65.0 in | Wt 148.0 lb

## 2020-10-30 DIAGNOSIS — Z0000 Encounter for general adult medical examination without abnormal findings: Secondary | ICD-10-CM | POA: Diagnosis not present

## 2020-10-30 DIAGNOSIS — Z23 Encounter for immunization: Secondary | ICD-10-CM | POA: Diagnosis not present

## 2020-10-30 DIAGNOSIS — I10 Essential (primary) hypertension: Secondary | ICD-10-CM | POA: Diagnosis not present

## 2020-10-30 DIAGNOSIS — F339 Major depressive disorder, recurrent, unspecified: Secondary | ICD-10-CM

## 2020-10-30 DIAGNOSIS — F419 Anxiety disorder, unspecified: Secondary | ICD-10-CM | POA: Diagnosis not present

## 2020-10-30 DIAGNOSIS — G4700 Insomnia, unspecified: Secondary | ICD-10-CM

## 2020-10-30 MED ORDER — METOPROLOL SUCCINATE ER 100 MG PO TB24: ORAL_TABLET | ORAL | 1 refills | Status: DC

## 2020-10-30 MED ORDER — ZOLPIDEM TARTRATE 10 MG PO TABS: ORAL_TABLET | ORAL | 5 refills | Status: DC

## 2020-10-30 MED ORDER — DULOXETINE HCL 60 MG PO CPEP: ORAL_CAPSULE | ORAL | 1 refills | Status: DC

## 2020-10-30 MED ORDER — LORAZEPAM 0.5 MG PO TABS: 0.5000 mg | ORAL_TABLET | Freq: Two times a day (BID) | ORAL | 2 refills | Status: AC | PRN

## 2020-10-30 NOTE — Assessment & Plan Note (Signed)
Under good control on current regimen. Continue current regimen. Continue to monitor. Call with any concerns. Refills given for 6 months. Call with any concerns.

## 2020-10-30 NOTE — Progress Notes (Signed)
BP 127/82   Pulse 71   Temp 98.4 F (36.9 C) (Oral)   Ht '5\' 5"'$  (1.651 m)   Wt 148 lb (67.1 kg)   SpO2 98%   BMI 24.63 kg/m    Subjective:    Patient ID: Morgan Cisneros, female    DOB: 1966/02/22, 55 y.o.   MRN: [id number]  HPI: Morgan Cisneros is a 55 y.o. female presenting on 10/30/2020 for comprehensive medical examination. Current medical complaints include:  HYPERTENSION Hypertension status: controlled  Satisfied with current treatment? yes Duration of hypertension: chronic BP monitoring frequency:  not checking BP medication side effects:  no Medication compliance: excellent compliance Previous BP meds: meroprolol Aspirin: no Recurrent headaches: no Visual changes: no Palpitations: no Dyspnea: no Chest pain: no Lower extremity edema: no Dizzy/lightheaded: no  ANXIETY/STRESS- has been taking lorazepam about 2x a day still Duration: chronic  Status:better Anxious mood: yes  Excessive worrying: yes Irritability: yes  Sweating: no Nausea: no Palpitations:no Hyperventilation: no Panic attacks: no Agoraphobia: no  Obscessions/compulsions: yes Depressed mood: yes Depression screen [facility] 2/9 10/30/2020 07/10/2020 05/08/2020 04/17/2020 03/31/2020  Decreased Interest '1 1 2 3 3  '$ Down, Depressed, Hopeless '2 2 3 3 2  '$ PHQ - 2 Score '3 3 5 6 5  '$ Altered sleeping '3 2 2 3 3  '$ Tired, decreased energy '3 3 2 3 3  '$ Change in appetite 0 - 0 3 2  Feeling bad or failure about yourself  0 '2 2 3 1  '$ Trouble concentrating '1 2 2 3 3  '$ Moving slowly or fidgety/restless 0 0 0 0 0  Suicidal thoughts 0 0 0 0 0  PHQ-9 Score '10 12 13 21 17  '$ Difficult doing work/chores Very difficult Not difficult at all Not difficult at all Extremely dIfficult Somewhat difficult  Some recent data might be hidden   GAD 7 : Generalized Anxiety Score 10/30/2020 07/10/2020 04/17/2020 03/31/2020  Nervous, Anxious, on Edge '2 2 3 3  '$ Control/stop worrying '2 2 3 3  '$ Worry too much - different things '3 2 3 3   '$ Trouble relaxing '3 2 3 3  '$ Restless 1 1 0 1  Easily annoyed or irritable 0 0 0 1  Afraid - awful might happen '1 3 3 1  '$ Total GAD 7 Score '12 12 15 15  '$ Anxiety Difficulty Very difficult Not difficult at all Extremely difficult Somewhat difficult   Anhedonia: no Weight changes: no Insomnia: yes   Hypersomnia: no Fatigue/loss of energy: yes Feelings of worthlessness: no Feelings of guilt: no Impaired concentration/indecisiveness: no Suicidal ideations: no  Crying spells: no Recent Stressors/Life Changes: yes   Relationship problems: no   Family stress: yes     Financial stress: no    Job stress: no    Recent death/loss: yes  INSOMNIA Duration: chronic Satisfied with sleep quality: yes Difficulty falling asleep: no Difficulty staying asleep: no Waking a few hours after sleep onset: no Early morning awakenings: no Daytime hypersomnolence: no Wakes feeling refreshed: yes Good sleep hygiene: yes Apnea: no Snoring: no Depressed/anxious mood: yes Recent stress: yes Restless legs/nocturnal leg cramps: no Chronic pain/arthritis: no History of sleep study: yes Treatments attempted: melatonin, uinsom, benadryl, and ambien   Menopausal Symptoms: no  Depression Screen done today and results listed below:  Depression screen [facility] 2/9 10/30/2020 07/10/2020 05/08/2020 04/17/2020 03/31/2020  Decreased Interest '1 1 2 3 3  '$ Down, Depressed, Hopeless '2 2 3 3 2  '$ PHQ - 2 Score '3 3 5 6 '$ 5  Altered sleeping '3 2 2 3 3  '$ Tired, decreased energy '3 3 2 3 3  '$ Change in appetite 0 - 0 3 2  Feeling bad or failure about yourself  0 '2 2 3 1  '$ Trouble concentrating '1 2 2 3 3  '$ Moving slowly or fidgety/restless 0 0 0 0 0  Suicidal thoughts 0 0 0 0 0  PHQ-9 Score '10 12 13 21 17  '$ Difficult doing work/chores Very difficult Not difficult at all Not difficult at all Extremely dIfficult Somewhat difficult  Some recent data might be hidden    Past Medical History:  Past Medical History:  Diagnosis Date    Abdominal pain    Acute gastritis without hemorrhage    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 difficile colitis 10/2016   Diverticulitis 10/2016   Diverticulitis of large intestine without perforation or abscess without bleeding    Flank pain    Gross hematuria    History of kidney stones    Hypertension    Inflammation of sacroiliac joint (HCC) 10/28/2016   Insomnia    LBP (low back pain) 12/13/2014   Ovarian cyst    Renal calculus, right    Renal calculus, right    Thoracic back pain    Right sided    Surgical History:  Past Surgical History:  Procedure Laterality Date   ABDOMINAL HYSTERECTOMY  08/2013   ABDOMINAL HYSTERECTOMY     BREAST BIOPSY Left 08/10/2016   [place] core, PASH   BREAST BIOPSY Right 01/17/2020   no clip due to pt condition during biopsy-neg   BREAST BIOPSY Right 02/07/2020   re-biopsy due to prior biopsy/ coil clip/ path pending   COLONOSCOPY WITH PROPOFOL N/A 04/25/2017   Procedure: COLONOSCOPY WITH PROPOFOL;  Surgeon: Lucilla Lame, MD;  Location: [place];  Service: Endoscopy;  Laterality: N/A;   CYSTOSCOPY  11/26/2010   with stent placement   ESOPHAGOGASTRODUODENOSCOPY (EGD) WITH PROPOFOL N/A 04/25/2017   Procedure: ESOPHAGOGASTRODUODENOSCOPY (EGD) WITH PROPOFOL;  Surgeon: Lucilla Lame, MD;  Location: [place];  Service: Endoscopy;  Laterality: N/A;   HEMORRHOID SURGERY     kidney stent     LAPAROSCOPIC APPENDECTOMY N/A 02/02/2017   Procedure: APPENDECTOMY LAPAROSCOPIC;  Surgeon: Clayburn Pert, MD;  Location: [facility];  Service: General;  Laterality: N/A;   LAPAROSCOPIC SIGMOID COLECTOMY N/A 11/01/2016   Procedure: LAPAROSCOPIC SIGMOID COLECTOMY;  Surgeon: Clayburn Pert, MD;  Location: [facility];  Service: General;  Laterality: N/A;   LITHOTRIPSY     X 4   POLYPECTOMY  04/25/2017   Procedure: POLYPECTOMY INTESTINAL;  Surgeon: Lucilla Lame, MD;  Location: [place];  Service: Endoscopy;;   TONSILLECTOMY      TUBAL LIGATION     Ureteroscopy Right 11/21/1997   with holmium laser    Medications:  Current Outpatient Medications on File Prior to Visit  Medication Sig   DULoxetine (CYMBALTA) 60 MG capsule TAKE 1 CAPSULE(60 MG) BY MOUTH DAILY   LORazepam (ATIVAN) 0.5 MG tablet Take 1-2 tablets (0.5-1 mg total) by mouth 2 (two) times daily as needed for anxiety.   metoprolol succinate (TOPROL-XL) 100 MG 24 hr tablet TAKE 1 TABLET(100 MG) BY MOUTH DAILY   zolpidem (AMBIEN) 10 MG tablet TAKE 1 TABLET BY MOUTH AT BEDTIME FOR SLEEP   No current facility-administered medications on file prior to visit.    Allergies:  Allergies  Allergen Reactions   Wellbutrin [Bupropion] Other (See Comments)    irritiability  Urocit-K [Potassium Citrate] Nausea Only    Social History:  Social History   Socioeconomic History   Marital status: Widowed    Spouse name: Not on file   Number of children: Not on file   Years of education: Not on file   Highest education level: Not on file  Occupational History   Not on file  Tobacco Use   Smoking status: Every Day    Packs/day: 0.25    Types: Cigarettes   Smokeless tobacco: Never  Vaping Use   Vaping Use: Never used  Substance and Sexual Activity   Alcohol use: Yes    Alcohol/week: 0.0 standard drinks    Comment: occasional   Drug use: No   Sexual activity: Not Currently    Birth control/protection: Surgical  Other Topics Concern   Not on file  Social History Narrative   Not on file   Social Determinants of Health   Financial Resource Strain: Not on file  Food Insecurity: Not on file  Transportation Needs: Not on file  Physical Activity: Not on file  Stress: Not on file  Social Connections: Not on file  Intimate Partner Violence: Not on file   Social History   Tobacco Use  Smoking Status Every Day   Packs/day: 0.25   Types: Cigarettes  Smokeless Tobacco Never   Social History   Substance and Sexual Activity  Alcohol Use Yes    Alcohol/week: 0.0 standard drinks   Comment: occasional    Family History:  Family History  Problem Relation Age of Onset   Kidney Stones Mother    Ovarian cancer Mother    Colon cancer Mother    Lung cancer Father    Hyperlipidemia Sister    Hypertension Sister    Hypertension Sister    Hyperlipidemia Sister    Alzheimer's disease Maternal Grandmother    Cancer Maternal Grandfather        Liver   Alzheimer's disease Paternal Grandfather    Breast cancer Neg Hx     Past medical history, surgical history, medications, allergies, family history and social history reviewed with patient today and changes made to appropriate areas of the chart.   Review of Systems  Constitutional: Negative.   HENT: Negative.    Eyes: Negative.   Respiratory: Negative.    Cardiovascular: Negative.   Gastrointestinal:  Positive for diarrhea. Negative for abdominal pain, blood in stool, constipation, heartburn, melena, nausea and vomiting.  Genitourinary: Negative.   Musculoskeletal: Negative.   Skin: Negative.   Neurological: Negative.   Endo/Heme/Allergies: Negative.   Psychiatric/Behavioral:  Positive for depression. Negative for hallucinations, memory loss, substance abuse and suicidal ideas. The patient is nervous/anxious and has insomnia.   All other ROS negative except what is listed above and in the HPI.      Objective:    BP 127/82   Pulse 71   Temp 98.4 F (36.9 C) (Oral)   Ht '5\' 5"'$  (1.651 m)   Wt 148 lb (67.1 kg)   SpO2 98%   BMI 24.63 kg/m   Wt Readings from Last 3 Encounters:  10/30/20 148 lb (67.1 kg)  07/10/20 157 lb 8 oz (71.4 kg)  05/14/20 128 lb 4.9 oz (58.2 kg)    Physical Exam Vitals and nursing note reviewed.  Constitutional:      General: She is not in acute distress.    Appearance: Normal appearance. She is not ill-appearing, toxic-appearing or diaphoretic.  HENT:     Head: Normocephalic and atraumatic.  Right Ear: Tympanic membrane, ear canal and  external ear normal. There is no impacted cerumen.     Left Ear: Tympanic membrane, ear canal and external ear normal. There is no impacted cerumen.     Nose: Nose normal. No congestion or rhinorrhea.     Mouth/Throat:     Mouth: Mucous membranes are moist.     Pharynx: Oropharynx is clear. No oropharyngeal exudate or posterior oropharyngeal erythema.  Eyes:     General: No scleral icterus.       Right eye: No discharge.        Left eye: No discharge.     Extraocular Movements: Extraocular movements intact.     Conjunctiva/sclera: Conjunctivae normal.     Pupils: Pupils are equal, round, and reactive to light.  Neck:     Vascular: No carotid bruit.  Cardiovascular:     Rate and Rhythm: Normal rate and regular rhythm.     Pulses: Normal pulses.     Heart sounds: No murmur heard.   No friction rub. No gallop.  Pulmonary:     Effort: Pulmonary effort is normal. No respiratory distress.     Breath sounds: Normal breath sounds. No stridor. No wheezing, rhonchi or rales.  Chest:     Chest wall: No tenderness.  Abdominal:     General: Abdomen is flat. Bowel sounds are normal. There is no distension.     Palpations: Abdomen is soft. There is no mass.     Tenderness: There is no abdominal tenderness. There is no right CVA tenderness, left CVA tenderness, guarding or rebound.     Hernia: No hernia is present.  Genitourinary:    Comments: Breast and pelvic exams deferred with shared decision making Musculoskeletal:        General: No swelling, tenderness, deformity or signs of injury.     Cervical back: Normal range of motion and neck supple. No rigidity. No muscular tenderness.     Right lower leg: No edema.     Left lower leg: No edema.  Lymphadenopathy:     Cervical: No cervical adenopathy.  Skin:    General: Skin is warm and dry.     Capillary Refill: Capillary refill takes less than 2 seconds.     Coloration: Skin is not jaundiced or pale.     Findings: No bruising, erythema,  lesion or rash.  Neurological:     General: No focal deficit present.     Mental Status: She is alert and oriented to person, place, and time. Mental status is at baseline.     Cranial Nerves: No cranial nerve deficit.     Sensory: No sensory deficit.     Motor: No weakness.     Coordination: Coordination normal.     Gait: Gait normal.     Deep Tendon Reflexes: Reflexes normal.  Psychiatric:        Mood and Affect: Mood normal.        Behavior: Behavior normal.        Thought Content: Thought content normal.        Judgment: Judgment normal.    Results for orders placed or performed in visit on 04/08/20  Novel Coronavirus, NAA ([facility])   Specimen: Saline  Result Value Ref Range   SARS-CoV-2, NAA Not Detected Not Detected  SARS-COV-2, NAA 2 DAY TAT  Result Value Ref Range   SARS-CoV-2, NAA 2 DAY TAT Performed       Assessment & Plan:   Problem  List Items Addressed This Visit       Cardiovascular and Mediastinum   Essential hypertension    Under good control on current regimen. Continue current regimen. Continue to monitor. Call with any concerns. Refills given. Labs drawn.         Relevant Orders   Microalbumin, Urine Waived     Other   Insomnia    Under good control on current regimen. Continue current regimen. Continue to monitor. Call with any concerns. Refills given for 6 months. Call with any concerns.        Anxiety    Under good control on current regimen. Continue current regimen. Continue to monitor. Call with any concerns. Refills given for 3 months. Follow up in 3 months.       Depression, recurrent ([place])    Under good control on current regimen. Continue current regimen. Continue to monitor. Call with any concerns. Refills given for 3 months. Follow up in 3 months.       Other Visit Diagnoses     Routine general medical examination at a health care facility    -  Primary   Vaccines updated. Screening labs checked today. Pap N/A. Mammo and  colonoscopy UTD. Continue diet and exercise. Call with any concerns.    Relevant Orders   CBC with Differential/Platelet   Comprehensive metabolic panel   Lipid Panel w/o Chol/HDL Ratio   Urinalysis, Routine w reflex microscopic   TSH   Microalbumin, Urine Waived   Hepatitis C Antibody        Follow up plan: Return in about 3 months (around 01/30/2021) for 2-6 months for 2nd shingles shot.   LABORATORY TESTING:  - Pap smear: not applicable  IMMUNIZATIONS:   - Tdap: Tetanus vaccination status reviewed: last tetanus booster within 10 years. - Influenza: Postponed to flu season - Pneumovax: Administered today - Prevnar: Not applicable - COVID Up to date - Shingrix vaccine: Done today  SCREENING: -Mammogram: Ordered today  - Colonoscopy: Up to date   PATIENT COUNSELING:   Advised to take 1 mg of folate supplement per day if capable of pregnancy.   Sexuality: Discussed sexually transmitted diseases, partner selection, use of condoms, avoidance of unintended pregnancy  and contraceptive alternatives.   Advised to avoid cigarette smoking.  I discussed with the patient that most people either abstain from alcohol or drink within safe limits (<=14/week and <=4 drinks/occasion for males, <=7/weeks and <= 3 drinks/occasion for females) and that the risk for alcohol disorders and other health effects rises proportionally with the number of drinks per week and how often a drinker exceeds daily limits.  Discussed cessation/primary prevention of drug use and availability of treatment for abuse.   Diet: Encouraged to adjust caloric intake to maintain  or achieve ideal body weight, to reduce intake of dietary saturated fat and total fat, to limit sodium intake by avoiding high sodium foods and not adding table salt, and to maintain adequate dietary potassium and calcium preferably from fresh fruits, vegetables, and low-fat dairy products.    stressed the importance of regular  exercise  Injury prevention: Discussed safety belts, safety helmets, smoke detector, smoking near bedding or upholstery.   Dental health: Discussed importance of regular tooth brushing, flossing, and dental visits.    NEXT PREVENTATIVE PHYSICAL DUE IN 1 YEAR. Return in about 3 months (around 01/30/2021) for 2-6 months for 2nd shingles shot.

## 2020-10-30 NOTE — Assessment & Plan Note (Signed)
Under good control on current regimen. Continue current regimen. Continue to monitor. Call with any concerns. Refills given. Labs drawn.   

## 2020-10-30 NOTE — Assessment & Plan Note (Addendum)
Under good control on current regimen. Continue current regimen. Continue to monitor. Call with any concerns. Refills given for 3 months. Follow up in 3 months.   

## 2020-10-30 NOTE — Assessment & Plan Note (Signed)
Under good control on current regimen. Continue current regimen. Continue to monitor. Call with any concerns. Refills given for 3 months. Follow up in 3 months.   

## 2020-10-30 NOTE — Patient Instructions (Signed)
Call to schedule your mammogram: [facility]  Address: [address], [place], [place] [address]  Phone: [phone]  

## 2020-11-06 ENCOUNTER — Other Ambulatory Visit: Payer: Self-pay

## 2020-11-06 ENCOUNTER — Other Ambulatory Visit: Payer: BC Managed Care – PPO

## 2020-11-06 LAB — URINALYSIS, ROUTINE W REFLEX MICROSCOPIC
Bilirubin, UA: NEGATIVE
Glucose, UA: NEGATIVE
Ketones, UA: NEGATIVE
Leukocytes,UA: NEGATIVE
Nitrite, UA: NEGATIVE
Protein,UA: NEGATIVE
Specific Gravity, UA: 1.025 (ref 1.005–1.030)
Urobilinogen, Ur: 0.2 mg/dL (ref 0.2–1.0)
pH, UA: 5.5 (ref 5.0–7.5)

## 2020-11-06 LAB — MICROSCOPIC EXAMINATION
Bacteria, UA: NONE SEEN
WBC, UA: NONE SEEN /hpf (ref 0–5)

## 2020-11-06 LAB — MICROALBUMIN, URINE WAIVED
Creatinine, Urine Waived: 300 mg/dL (ref 10–300)
Microalb, Ur Waived: 80 mg/L — ABNORMAL HIGH (ref 0–19)
Microalb/Creat Ratio: <30 mg/g (ref <30)

## 2020-11-07 LAB — COMPREHENSIVE METABOLIC PANEL
ALT: 34 IU/L — ABNORMAL HIGH (ref 0–32)
AST: 26 IU/L (ref 0–40)
Albumin/Globulin Ratio: 1.9 (ref 1.2–2.2)
Albumin: 4.5 g/dL (ref 3.8–4.9)
Alkaline Phosphatase: 182 IU/L — ABNORMAL HIGH (ref 44–121)
BUN/Creatinine Ratio: 16 (ref 9–23)
BUN: 13 mg/dL (ref 6–24)
Bilirubin Total: 0.3 mg/dL (ref 0.0–1.2)
CO2: 19 mmol/L — ABNORMAL LOW (ref 20–29)
Calcium: 9.6 mg/dL (ref 8.7–10.2)
Chloride: 104 mmol/L (ref 96–106)
Creatinine, Ser: 0.81 mg/dL (ref 0.57–1.00)
Globulin, Total: 2.4 g/dL (ref 1.5–4.5)
Glucose: 133 mg/dL — ABNORMAL HIGH (ref 65–99)
Potassium: 4.1 mmol/L (ref 3.5–5.2)
Sodium: 144 mmol/L (ref 134–144)
Total Protein: 6.9 g/dL (ref 6.0–8.5)
eGFR: 87 mL/min/{1.73_m2} (ref >59)

## 2020-11-07 LAB — CBC WITH DIFFERENTIAL/PLATELET
Basophils Absolute: 0.1 10*3/uL (ref 0.0–0.2)
Basos: 1 %
EOS (ABSOLUTE): 0.3 10*3/uL (ref 0.0–0.4)
Eos: 3 %
Hematocrit: 46.5 % (ref 34.0–46.6)
Hemoglobin: 15.8 g/dL (ref 11.1–15.9)
Immature Grans (Abs): 0 10*3/uL (ref 0.0–0.1)
Immature Granulocytes: 0 %
Lymphocytes Absolute: 3.1 10*3/uL (ref 0.7–3.1)
Lymphs: 35 %
MCH: 32.6 pg (ref 26.6–33.0)
MCHC: 34 g/dL (ref 31.5–35.7)
MCV: 96 fL (ref 79–97)
Monocytes Absolute: 0.4 10*3/uL (ref 0.1–0.9)
Monocytes: 4 %
Neutrophils Absolute: 5.1 10*3/uL (ref 1.4–7.0)
Neutrophils: 57 %
Platelets: 319 10*3/uL (ref 150–450)
RBC: 4.84 x10E6/uL (ref 3.77–5.28)
RDW: 13.3 % (ref 11.7–15.4)
WBC: 9.1 10*3/uL (ref 3.4–10.8)

## 2020-11-07 LAB — HEPATITIS C ANTIBODY: Hep C Virus Ab: <0.1 s/co ratio (ref 0.0–0.9)

## 2020-11-07 LAB — LIPID PANEL W/O CHOL/HDL RATIO
Cholesterol, Total: 237 mg/dL — ABNORMAL HIGH (ref 100–199)
HDL: 36 mg/dL — ABNORMAL LOW (ref >39)
LDL Chol Calc (NIH): 119 mg/dL — ABNORMAL HIGH (ref 0–99)
Triglycerides: 464 mg/dL — ABNORMAL HIGH (ref 0–149)
VLDL Cholesterol Cal: 82 mg/dL — ABNORMAL HIGH (ref 5–40)

## 2020-11-07 LAB — TSH: TSH: 1.78 u[IU]/mL (ref 0.450–4.500)

## 2020-11-13 ENCOUNTER — Ambulatory Visit: Payer: BC Managed Care – PPO | Admitting: Licensed Clinical Social Worker

## 2020-11-13 DIAGNOSIS — F339 Major depressive disorder, recurrent, unspecified: Secondary | ICD-10-CM

## 2020-11-13 DIAGNOSIS — F419 Anxiety disorder, unspecified: Secondary | ICD-10-CM

## 2020-11-13 NOTE — Chronic Care Management (AMB) (Signed)
    Clinical Social Work  Chronic Care Management   Phone Outreach    11/13/2020 Name: Morgan Cisneros MRN: [id number] DOB: May 02, 1965  [name] is a 55 y.o. year old female who is a primary care patient of Valerie Roys, DO .   Reason for referral: [facility].    F/U phone call today to assess needs, progress and barriers with care plan goals.   Unable to keep phone appointment today and requested to reschedule.  Plan:Appointment was rescheduled with CCM LCSW  Review of patient status, including review of consultants reports, relevant laboratory and other test results, and collaboration with appropriate care team members and the patient's provider was performed as part of comprehensive patient evaluation and provision of care management services.    Christa See, MSW, [email or web address] Phone [phone] 3:48 PM

## 2020-11-21 ENCOUNTER — Telehealth: Payer: Self-pay | Admitting: Licensed Clinical Social Worker

## 2020-11-21 ENCOUNTER — Telehealth: Payer: BC Managed Care – PPO

## 2020-11-21 NOTE — Progress Notes (Signed)
Received notification of un-viewed results and result message from provider on Mychart. Generated letter with results and result message and mailed to patient.

## 2020-11-21 NOTE — Telephone Encounter (Signed)
    Clinical Social Work  Care Management   Phone Outreach    11/21/2020 Name: Dimitria Jeansonne MRN: [id number] DOB: Aug 26, 1965  Mylani Barbush is a 55 y.o. year old female who is a primary care patient of Valerie Roys, DO .   Reason for referral: Grief Counseling.    F/U phone call today to assess needs, progress and barriers with care plan goals.   Telephone outreach was unsuccessful A HIPPA compliant phone message was left for the patient providing contact information and requesting a return call.   Plan:CCM LCSW will wait for return call. If no return call is received, Will route chart to Care Guide to see if patient would like to reschedule phone appointment   Review of patient status, including review of consultants reports, relevant laboratory and other test results, and collaboration with appropriate care team members and the patient's provider was performed as part of comprehensive patient evaluation and provision of care management services.     Christa See, MSW, [email or web address] Phone [phone] 3:26 PM

## 2020-12-02 ENCOUNTER — Telehealth: Payer: Self-pay

## 2020-12-02 NOTE — Chronic Care Management (AMB) (Signed)
  Care Management   Note  12/02/2020 Name: Sara-Jane Geeter MRN: [id number] DOB: 1966/03/05  [name] is a 55 y.o. year old female who is a primary care patient of Valerie Roys, DO and is actively engaged with the care management team. I reached out to [name] by phone today to assist with re-scheduling a follow up visit with the Licensed Clinical Social Worker  Follow up plan: Unsuccessful telephone outreach attempt made. A HIPAA compliant phone message was left for the patient providing contact information and requesting a return call.  The care management team will reach out to the patient again over the next 7 days.  If patient returns call to provider office, please advise to call [facility]  at [place], [place], [place], [place] [address] Direct Dial: [phone] [email or web address] Website: [email or web address]

## 2020-12-19 NOTE — Telephone Encounter (Signed)
Patient has been rescheduled.

## 2020-12-19 NOTE — Chronic Care Management (AMB) (Signed)
  Care Management   Note  12/19/2020 Name: [name] MRN: [id number] DOB: 1965-12-08  Morgan Cisneros is a 55 y.o. year old female who is a primary care patient of Valerie Roys, DO and is actively engaged with the care management team. I reached out to Morgan Cisneros by phone today to assist with re-scheduling a follow up visit with the Licensed Clinical Social Worker  Follow up plan: Telephone appointment with care management team member scheduled for:01/09/2021  Noreene Larsson, [place], [place], [place] [address] Direct Dial: [phone] [email or web address] Website: [email or web address]

## 2020-12-26 ENCOUNTER — Other Ambulatory Visit: Payer: Self-pay | Admitting: Family Medicine

## 2020-12-26 NOTE — Telephone Encounter (Signed)
Requested medication (s) are due for refill today: No  Requested medication (s) are on the active medication list: No  Last refill:  Unknown  Future visit scheduled: Yes  Notes to clinic:  Unable to refill per protocol, cannot delegate, d/c by provider 04/2020.     Requested Prescriptions  Pending Prescriptions Disp Refills   LORazepam (ATIVAN) 0.5 MG tablet [Pharmacy Med Name: LORAZEPAM 0.5 MG TAB] 60 tablet     Sig: TAKE 1-2 TABLETS BY MOUTH TWICE DAILY ASNEEDED FOR ANXIETY     Not Delegated - Psychiatry:  Anxiolytics/Hypnotics Failed - 12/26/2020 11:08 AM      Failed - This refill cannot be delegated      Failed - Urine Drug Screen completed in last 360 days      Passed - Valid encounter within last 6 months    Recent Outpatient Visits           1 month ago Routine general medical examination at a health care facility   [facility], [place], DO   5 months ago Essential hypertension   [place], [place] P, DO   7 months ago Depression, recurrent [facility])   [facility], [place] P, DO   7 months ago Acute non-recurrent maxillary sinusitis   [facility] Kathrine Haddock, NP   8 months ago Depression, recurrent [facility])   [place], Barb Merino, DO       Future Appointments             In 1 week Wynetta Emery, Barb Merino, DO [place]

## 2020-12-29 ENCOUNTER — Other Ambulatory Visit: Payer: Self-pay | Admitting: Family Medicine

## 2020-12-29 DIAGNOSIS — N6310 Unspecified lump in the right breast, unspecified quadrant: Secondary | ICD-10-CM

## 2021-01-02 ENCOUNTER — Encounter: Payer: Self-pay | Admitting: Family Medicine

## 2021-01-02 ENCOUNTER — Telehealth (INDEPENDENT_AMBULATORY_CARE_PROVIDER_SITE_OTHER): Payer: BC Managed Care – PPO | Admitting: Family Medicine

## 2021-01-02 DIAGNOSIS — I10 Essential (primary) hypertension: Secondary | ICD-10-CM | POA: Diagnosis not present

## 2021-01-02 DIAGNOSIS — F339 Major depressive disorder, recurrent, unspecified: Secondary | ICD-10-CM

## 2021-01-02 MED ORDER — LISINOPRIL 5 MG PO TABS: 5.0000 mg | ORAL_TABLET | Freq: Every day | ORAL | 3 refills | Status: DC

## 2021-01-02 MED ORDER — LORAZEPAM 0.5 MG PO TABS: 0.5000 mg | ORAL_TABLET | Freq: Two times a day (BID) | ORAL | 2 refills | Status: DC | PRN

## 2021-01-02 NOTE — Assessment & Plan Note (Signed)
Running high. Will restart lisinopril 5mg  and recheck 1 month. Call with any concerns.

## 2021-01-02 NOTE — Assessment & Plan Note (Signed)
In exacerbation with the anniversary of the death of her husband. Will continue current regimen. Increase lorazepam slightly as needed. Call with any concerns. Continue to monitor.

## 2021-01-02 NOTE — Progress Notes (Signed)
BP (!) 147/94    Subjective:    Patient ID: Morgan Cisneros, female    DOB: 01/16/1966, 55 y.o.   MRN: [id number]  HPI: Morgan Cisneros is a 55 y.o. female  Chief Complaint  Patient presents with   Depression   ANXIETY/DEPRESSION Duration: chronic Status:exacerbated Anxious mood: yes  Excessive worrying: yes Irritability: no  Sweating: no Nausea: no Palpitations:yes Hyperventilation: no Panic attacks: no Agoraphobia: no  Obscessions/compulsions: no Depressed mood: yes Depression screen [facility] 2/9 10/30/2020 07/10/2020 05/08/2020 04/17/2020 03/31/2020  Decreased Interest _0 Down, Depressed, Hopeless _1 PHQ - 2 Score _2 Altered sleeping _3 Tired, decreased energy _4 Change in appetite 0 - 0 3 2  Feeling bad or failure about yourself  0 _5 Trouble concentrating _6 Moving slowly or fidgety/restless 0 0 0 0 0  Suicidal thoughts 0 0 0 0 0  PHQ-9 Score _7 Difficult doing work/chores Very difficult Not difficult at all Not difficult at all Extremely dIfficult Somewhat difficult  Some recent data might be hidden   Anhedonia: no Weight changes: no Insomnia: yes   Hypersomnia: no Fatigue/loss of energy: yes Feelings of worthlessness: no Feelings of guilt: no Impaired concentration/indecisiveness: no Suicidal ideations: no  Crying spells: yes Recent Stressors/Life Changes: yes   Relationship problems: no   Family stress: yes     Financial stress: no    Job stress: no    Recent death/loss: yes  HYPERTENSION Hypertension status: exacerbated  Satisfied with current treatment? no Duration of hypertension: chronic BP monitoring frequency:  a few times a week BP range: 140s-150s BP medication side effects:  no Medication compliance: excellent compliance Previous BP meds:metoprolol, lisinopril Aspirin: no Recurrent headaches: yes Visual changes: no Palpitations: no Dyspnea: no Chest pain: no Lower  extremity edema: no Dizzy/lightheaded: no   Relevant past medical, surgical, family and social history reviewed and updated as indicated. Interim medical history since our last visit reviewed. Allergies and medications reviewed and updated.  Review of Systems  Constitutional: Negative.   Respiratory: Negative.    Cardiovascular: Negative.   Gastrointestinal: Negative.   Musculoskeletal: Negative.   Neurological:  Positive for headaches. Negative for dizziness, tremors, seizures, syncope, facial asymmetry, speech difficulty, weakness, light-headedness and numbness.  Psychiatric/Behavioral:  Positive for dysphoric mood and sleep disturbance. Negative for agitation, behavioral problems, confusion, decreased concentration, hallucinations, self-injury and suicidal ideas. The patient is nervous/anxious. The patient is not hyperactive.    Per HPI unless specifically indicated above     Objective:    BP (!) 147/94   Wt Readings from Last 3 Encounters:  10/30/20 148 lb (67.1 kg)  07/10/20 157 lb 8 oz (71.4 kg)  05/14/20 128 lb 4.9 oz (58.2 kg)    Physical Exam Vitals and nursing note reviewed.  Constitutional:      General: She is not in acute distress.    Appearance: Normal appearance. She is not ill-appearing, toxic-appearing or diaphoretic.  HENT:     Head: Normocephalic and atraumatic.     Right Ear: External ear normal.     Left Ear: External ear normal.     Nose: Nose normal.     Mouth/Throat:     Mouth: Mucous membranes are moist.     Pharynx: Oropharynx is clear.  Eyes:  General: No scleral icterus.       Right eye: No discharge.        Left eye: No discharge.     Conjunctiva/sclera: Conjunctivae normal.     Pupils: Pupils are equal, round, and reactive to light.  Pulmonary:     Effort: Pulmonary effort is normal. No respiratory distress.     Comments: Speaking in full sentences Musculoskeletal:        General: Normal range of motion.     Cervical back: Normal  range of motion.  Skin:    Coloration: Skin is not jaundiced or pale.     Findings: No bruising, erythema, lesion or rash.  Neurological:     Mental Status: She is alert and oriented to person, place, and time. Mental status is at baseline.  Psychiatric:        Mood and Affect: Mood normal. Affect is tearful.        Behavior: Behavior normal.        Thought Content: Thought content normal.        Judgment: Judgment normal.    Results for orders placed or performed in visit on 10/30/20  Microscopic Examination   Urine  Result Value Ref Range   WBC, UA None seen 0 - 5 /hpf   RBC 0-2 0 - 2 /hpf   Epithelial Cells (non renal) 0-10 0 - 10 /hpf   Bacteria, UA None seen None seen/Few  CBC with Differential/Platelet  Result Value Ref Range   WBC 9.1 3.4 - 10.8 x10E3/uL   RBC 4.84 3.77 - 5.28 x10E6/uL   Hemoglobin 15.8 11.1 - 15.9 g/dL   Hematocrit 46.5 34.0 - 46.6 %   MCV 96 79 - 97 fL   MCH 32.6 26.6 - 33.0 pg   MCHC 34.0 31.5 - 35.7 g/dL   RDW 13.3 11.7 - 15.4 %   Platelets 319 150 - 450 x10E3/uL   Neutrophils 57 Not Estab. %   Lymphs 35 Not Estab. %   Monocytes 4 Not Estab. %   Eos 3 Not Estab. %   Basos 1 Not Estab. %   Neutrophils Absolute 5.1 1.4 - 7.0 x10E3/uL   Lymphocytes Absolute 3.1 0.7 - 3.1 x10E3/uL   Monocytes Absolute 0.4 0.1 - 0.9 x10E3/uL   EOS (ABSOLUTE) 0.3 0.0 - 0.4 x10E3/uL   Basophils Absolute 0.1 0.0 - 0.2 x10E3/uL   Immature Granulocytes 0 Not Estab. %   Immature Grans (Abs) 0.0 0.0 - 0.1 x10E3/uL  Comprehensive metabolic panel  Result Value Ref Range   Glucose 133 (H) 65 - 99 mg/dL   BUN 13 6 - 24 mg/dL   Creatinine, Ser 0.81 0.57 - 1.00 mg/dL   eGFR 87 >59 mL/min/1.73   BUN/Creatinine Ratio 16 9 - 23   Sodium 144 134 - 144 mmol/L   Potassium 4.1 3.5 - 5.2 mmol/L   Chloride 104 96 - 106 mmol/L   CO2 19 (L) 20 - 29 mmol/L   Calcium 9.6 8.7 - 10.2 mg/dL   Total Protein 6.9 6.0 - 8.5 g/dL   Albumin 4.5 3.8 - 4.9 g/dL   Globulin, Total 2.4 1.5 -  4.5 g/dL   Albumin/Globulin Ratio 1.9 1.2 - 2.2   Bilirubin Total 0.3 0.0 - 1.2 mg/dL   Alkaline Phosphatase 182 (H) 44 - 121 IU/L   AST 26 0 - 40 IU/L   ALT 34 (H) 0 - 32 IU/L  Lipid Panel w/o Chol/HDL Ratio  Result Value Ref Range  Cholesterol, Total 237 (H) 100 - 199 mg/dL   Triglycerides 464 (H) 0 - 149 mg/dL   HDL 36 (L) >39 mg/dL   VLDL Cholesterol Cal 82 (H) 5 - 40 mg/dL   LDL Chol Calc (NIH) 119 (H) 0 - 99 mg/dL  Urinalysis, Routine w reflex microscopic  Result Value Ref Range   Specific Gravity, UA 1.025 1.005 - 1.030   pH, UA 5.5 5.0 - 7.5   Color, UA Yellow Yellow   Appearance Ur Cloudy (A) Clear   Leukocytes,UA Negative Negative   Protein,UA Negative Negative/Trace   Glucose, UA Negative Negative   Ketones, UA Negative Negative   RBC, UA 2+ (A) Negative   Bilirubin, UA Negative Negative   Urobilinogen, Ur 0.2 0.2 - 1.0 mg/dL   Nitrite, UA Negative Negative   Microscopic Examination See below:   TSH  Result Value Ref Range   TSH 1.780 0.450 - 4.500 uIU/mL  Microalbumin, Urine Waived  Result Value Ref Range   Microalb, Ur Waived 80 (H) 0 - 19 mg/L   Creatinine, Urine Waived 300 10 - 300 mg/dL   Microalb/Creat Ratio <30 <30 mg/g  Hepatitis C Antibody  Result Value Ref Range   Hep C Virus Ab <0.1 0.0 - 0.9 s/co ratio      Assessment & Plan:   Problem List Items Addressed This Visit       Cardiovascular and Mediastinum   Essential hypertension    Running high. Will restart lisinopril 35m and recheck 1 month. Call with any concerns.       Relevant Medications   lisinopril (ZESTRIL) 5 MG tablet     Other   Depression, recurrent (HCC)    In exacerbation with the anniversary of the death of her husband. Will continue current regimen. Increase lorazepam slightly as needed. Call with any concerns. Continue to monitor.       Relevant Medications   LORazepam (ATIVAN) 0.5 MG tablet     Follow up plan: Return in about 4 weeks (around 01/30/2021), or  follow up bp.   This visit was completed via video visit through MyChart due to the restrictions of the COVID-19 pandemic. All issues as above were discussed and addressed. Physical exam was done as above through visual confirmation on video through MyChart. If it was felt that the patient should be evaluated in the office, they were directed there. The patient verbally consented to this visit. Location of the patient: home Location of the provider: home Those involved with this call:  Provider: MPark Liter DO CMA:  SMarnette Burgess CMA Front Desk/Registration: [employer] Time spent on call:  25 minutes with patient face to face via video conference. More than 50% of this time was spent in counseling and coordination of care. 40 minutes total spent in review of patient's record and preparation of their chart.

## 2021-01-09 ENCOUNTER — Telehealth: Payer: BC Managed Care – PPO

## 2021-01-09 ENCOUNTER — Ambulatory Visit (INDEPENDENT_AMBULATORY_CARE_PROVIDER_SITE_OTHER): Payer: BC Managed Care – PPO

## 2021-01-09 ENCOUNTER — Other Ambulatory Visit: Payer: Self-pay

## 2021-01-09 DIAGNOSIS — Z23 Encounter for immunization: Secondary | ICD-10-CM

## 2021-01-09 NOTE — Progress Notes (Signed)
Patient presents today for immunizations for the Flu and the 2nd shingrix vaccination. Patient was given both and tolerated well

## 2021-01-21 ENCOUNTER — Other Ambulatory Visit: Payer: BC Managed Care – PPO

## 2021-01-21 ENCOUNTER — Inpatient Hospital Stay: Admission: RE | Admit: 2021-01-21 | Payer: BC Managed Care – PPO | Source: Ambulatory Visit

## 2021-01-26 ENCOUNTER — Telehealth: Payer: BC Managed Care – PPO

## 2021-01-28 ENCOUNTER — Telehealth: Payer: Self-pay | Admitting: Licensed Clinical Social Worker

## 2021-01-28 NOTE — Telephone Encounter (Signed)
    Clinical Social Work  Care Management   Phone Outreach    01/28/2021 Name: Tasnim Balentine MRN: [id number] DOB: [birth year]  [name] is a 55 y.o. year old female who is a primary care patient of Valerie Roys, DO .   Reason for referral: [facility].    F/U phone call today to assess needs, progress and barriers with care plan goals.   A HIPPA compliant phone message was left for the patient providing contact information and requesting a return call.  2nd unsuccessful telephone outreach attempt.  If unable to reach patient by phone on the 3rd attempt, will discontinue outreach calls but will be available at any time to provide services.   Plan:CCM LCSW will wait for return call. If no return call is received, Will reach out to patient again in the next 30 days .   Review of patient status, including review of consultants reports, relevant laboratory and other test results, and collaboration with appropriate care team members and the patient's provider was performed as part of comprehensive patient evaluation and provision of care management services.    Christa See, MSW, [email or web address] Phone [phone] 8:15 AM

## 2021-02-04 ENCOUNTER — Ambulatory Visit
Admission: RE | Admit: 2021-02-04 | Discharge: 2021-02-04 | Disposition: A | Discharge status: Home / Self Care | Payer: BC Managed Care – PPO | Source: Ambulatory Visit | Attending: Family Medicine | Admitting: Family Medicine

## 2021-02-04 ENCOUNTER — Other Ambulatory Visit: Payer: Self-pay

## 2021-02-04 DIAGNOSIS — N6310 Unspecified lump in the right breast, unspecified quadrant: Secondary | ICD-10-CM

## 2021-02-06 ENCOUNTER — Ambulatory Visit: Payer: BC Managed Care – PPO | Admitting: Family Medicine

## 2021-02-12 ENCOUNTER — Other Ambulatory Visit: Payer: Self-pay

## 2021-02-12 ENCOUNTER — Ambulatory Visit: Payer: BC Managed Care – PPO | Admitting: Family Medicine

## 2021-02-12 ENCOUNTER — Encounter: Payer: Self-pay | Admitting: Family Medicine

## 2021-02-12 VITALS — BP 122/84 | HR 84 | Wt 149.4 lb

## 2021-02-12 DIAGNOSIS — F339 Major depressive disorder, recurrent, unspecified: Secondary | ICD-10-CM

## 2021-02-12 DIAGNOSIS — F419 Anxiety disorder, unspecified: Secondary | ICD-10-CM | POA: Diagnosis not present

## 2021-02-12 DIAGNOSIS — R002 Palpitations: Secondary | ICD-10-CM

## 2021-02-12 DIAGNOSIS — I10 Essential (primary) hypertension: Secondary | ICD-10-CM | POA: Diagnosis not present

## 2021-02-12 MED ORDER — ARIPIPRAZOLE 5 MG PO TABS: 5.0000 mg | ORAL_TABLET | Freq: Every day | ORAL | 2 refills | Status: DC

## 2021-02-12 NOTE — Assessment & Plan Note (Signed)
Unclear if its due to anxiety, ambien or cardiac. EKG normal. Will add abilify and stop ambien. Will get her into cardiology for evaluation, ?holter. Continue to monitor. Call with any concerns.

## 2021-02-12 NOTE — Progress Notes (Signed)
BP 122/84   Pulse 84   Wt 149 lb 6.4 oz (67.8 kg)   SpO2 97%   BMI 24.86 kg/m    Subjective:    Patient ID: Morgan Cisneros, female    DOB: 1965/08/07, 55 y.o.   MRN: [id number]  HPI: Morgan Cisneros is a 55 y.o. female  Chief Complaint  Patient presents with   Hypertension   HYPERTENSION Hypertension status: controlled  Satisfied with current treatment? yes Duration of hypertension: chronic BP medication side effects:  no Medication compliance: excellent compliance Previous BP meds:metoprolol, lisinopril Aspirin: no Recurrent headaches: yes Visual changes: no Palpitations: yes Dyspnea: no Chest pain: no Lower extremity edema: no Dizzy/lightheaded: no  ANXIETY/STRESS Duration: chronic Status:exacerbated Anxious mood: yes  Excessive worrying: yes Irritability: no  Sweating: no Nausea: no Palpitations:yes Hyperventilation: no Panic attacks: no Agoraphobia: no  Obscessions/compulsions: no Depressed mood: yes Depression screen [facility] 2/9 02/12/2021 10/30/2020 07/10/2020 05/08/2020 04/17/2020  Decreased Interest 2 1 1 2 3   Down, Depressed, Hopeless 2 2 2 3 3   PHQ - 2 Score 4 3 3 5 6   Altered sleeping 3 3 2 2 3   Tired, decreased energy 2 3 3 2 3   Change in appetite 0 0 - 0 3  Feeling bad or failure about yourself  2 0 2 2 3   Trouble concentrating 2 1 2 2 3   Moving slowly or fidgety/restless 0 0 0 0 0  Suicidal thoughts 0 0 0 0 0  PHQ-9 Score 13 10 12 13 21   Difficult doing work/chores Not difficult at all Very difficult Not difficult at all Not difficult at all Extremely dIfficult  Some recent data might be hidden   Anhedonia: no Weight changes: no Insomnia: yes hard to fall asleep  Hypersomnia: no Fatigue/loss of energy: yes Feelings of worthlessness: no Feelings of guilt: no Impaired concentration/indecisiveness: no Suicidal ideations: no  Crying spells: no Recent Stressors/Life Changes: yes   Relationship problems: no   Family stress: yes      Financial stress: yes    Job stress: no    Recent death/loss: yes  PALPITATIONS Duration: months Symptom description: heart racing and beating hard Duration of episode: hours Frequency: recurrentl Activity when event occurred:  at random Related to exertion: no Dyspnea: no Chest pain: no Syncope: no Anxiety/stress: yes Nausea/vomiting: no Diaphoresis: no Coronary artery disease: no Congestive heart failure: no Arrhythmia:no Thyroid disease: no Status:  worse Treatments attempted: metoprolol   Relevant past medical, surgical, family and social history reviewed and updated as indicated. Interim medical history since our last visit reviewed. Allergies and medications reviewed and updated.  Review of Systems  Constitutional: Negative.   Respiratory: Negative.    Cardiovascular:  Positive for palpitations. Negative for chest pain and leg swelling.  Gastrointestinal: Negative.   Musculoskeletal: Negative.   Skin: Negative.   Neurological: Negative.   Psychiatric/Behavioral:  Positive for agitation, dysphoric mood and sleep disturbance. Negative for behavioral problems, confusion, decreased concentration, hallucinations, self-injury and suicidal ideas. The patient is nervous/anxious. The patient is not hyperactive.    Per HPI unless specifically indicated above     Objective:    BP 122/84   Pulse 84   Wt 149 lb 6.4 oz (67.8 kg)   SpO2 97%   BMI 24.86 kg/m   Wt Readings from Last 3 Encounters:  02/12/21 149 lb 6.4 oz (67.8 kg)  10/30/20 148 lb (67.1 kg)  07/10/20 157 lb 8 oz (71.4 kg)    Physical Exam Vitals  and nursing note reviewed.  Constitutional:      General: She is not in acute distress.    Appearance: Normal appearance. She is not ill-appearing, toxic-appearing or diaphoretic.  HENT:     Head: Normocephalic and atraumatic.     Right Ear: External ear normal.     Left Ear: External ear normal.     Nose: Nose normal.     Mouth/Throat:     Mouth: Mucous  membranes are moist.     Pharynx: Oropharynx is clear.  Eyes:     General: No scleral icterus.       Right eye: No discharge.        Left eye: No discharge.     Extraocular Movements: Extraocular movements intact.     Conjunctiva/sclera: Conjunctivae normal.     Pupils: Pupils are equal, round, and reactive to light.  Cardiovascular:     Rate and Rhythm: Normal rate and regular rhythm.     Pulses: Normal pulses.     Heart sounds: Normal heart sounds. No murmur heard.   No friction rub. No gallop.  Pulmonary:     Effort: Pulmonary effort is normal. No respiratory distress.     Breath sounds: Normal breath sounds. No stridor. No wheezing, rhonchi or rales.  Chest:     Chest wall: No tenderness.  Musculoskeletal:        General: Normal range of motion.     Cervical back: Normal range of motion and neck supple.  Skin:    General: Skin is warm and dry.     Capillary Refill: Capillary refill takes less than 2 seconds.     Coloration: Skin is not jaundiced or pale.     Findings: No bruising, erythema, lesion or rash.  Neurological:     General: No focal deficit present.     Mental Status: She is alert and oriented to person, place, and time. Mental status is at baseline.  Psychiatric:        Mood and Affect: Mood normal.        Behavior: Behavior normal.        Thought Content: Thought content normal.        Judgment: Judgment normal.    Results for orders placed or performed in visit on 10/30/20  Microscopic Examination   Urine  Result Value Ref Range   WBC, UA None seen 0 - 5 /hpf   RBC 0-2 0 - 2 /hpf   Epithelial Cells (non renal) 0-10 0 - 10 /hpf   Bacteria, UA None seen None seen/Few  CBC with Differential/Platelet  Result Value Ref Range   WBC 9.1 3.4 - 10.8 x10E3/uL   RBC 4.84 3.77 - 5.28 x10E6/uL   Hemoglobin 15.8 11.1 - 15.9 g/dL   Hematocrit 46.5 34.0 - 46.6 %   MCV 96 79 - 97 fL   MCH 32.6 26.6 - 33.0 pg   MCHC 34.0 31.5 - 35.7 g/dL   RDW 13.3 11.7 - 15.4 %    Platelets 319 150 - 450 x10E3/uL   Neutrophils 57 Not Estab. %   Lymphs 35 Not Estab. %   Monocytes 4 Not Estab. %   Eos 3 Not Estab. %   Basos 1 Not Estab. %   Neutrophils Absolute 5.1 1.4 - 7.0 x10E3/uL   Lymphocytes Absolute 3.1 0.7 - 3.1 x10E3/uL   Monocytes Absolute 0.4 0.1 - 0.9 x10E3/uL   EOS (ABSOLUTE) 0.3 0.0 - 0.4 x10E3/uL   Basophils Absolute 0.1 0.0 -  0.2 x10E3/uL   Immature Granulocytes 0 Not Estab. %   Immature Grans (Abs) 0.0 0.0 - 0.1 x10E3/uL  Comprehensive metabolic panel  Result Value Ref Range   Glucose 133 (H) 65 - 99 mg/dL   BUN 13 6 - 24 mg/dL   Creatinine, Ser 0.81 0.57 - 1.00 mg/dL   eGFR 87 >59 mL/min/1.73   BUN/Creatinine Ratio 16 9 - 23   Sodium 144 134 - 144 mmol/L   Potassium 4.1 3.5 - 5.2 mmol/L   Chloride 104 96 - 106 mmol/L   CO2 19 (L) 20 - 29 mmol/L   Calcium 9.6 8.7 - 10.2 mg/dL   Total Protein 6.9 6.0 - 8.5 g/dL   Albumin 4.5 3.8 - 4.9 g/dL   Globulin, Total 2.4 1.5 - 4.5 g/dL   Albumin/Globulin Ratio 1.9 1.2 - 2.2   Bilirubin Total 0.3 0.0 - 1.2 mg/dL   Alkaline Phosphatase 182 (H) 44 - 121 IU/L   AST 26 0 - 40 IU/L   ALT 34 (H) 0 - 32 IU/L  Lipid Panel w/o Chol/HDL Ratio  Result Value Ref Range   Cholesterol, Total 237 (H) 100 - 199 mg/dL   Triglycerides 464 (H) 0 - 149 mg/dL   HDL 36 (L) >39 mg/dL   VLDL Cholesterol Cal 82 (H) 5 - 40 mg/dL   LDL Chol Calc (NIH) 119 (H) 0 - 99 mg/dL  Urinalysis, Routine w reflex microscopic  Result Value Ref Range   Specific Gravity, UA 1.025 1.005 - 1.030   pH, UA 5.5 5.0 - 7.5   Color, UA Yellow Yellow   Appearance Ur Cloudy (A) Clear   Leukocytes,UA Negative Negative   Protein,UA Negative Negative/Trace   Glucose, UA Negative Negative   Ketones, UA Negative Negative   RBC, UA 2+ (A) Negative   Bilirubin, UA Negative Negative   Urobilinogen, Ur 0.2 0.2 - 1.0 mg/dL   Nitrite, UA Negative Negative   Microscopic Examination See below:   TSH  Result Value Ref Range   TSH 1.780 0.450 -  4.500 uIU/mL  Microalbumin, Urine Waived  Result Value Ref Range   Microalb, Ur Waived 80 (H) 0 - 19 mg/L   Creatinine, Urine Waived 300 10 - 300 mg/dL   Microalb/Creat Ratio <30 <30 mg/g  Hepatitis C Antibody  Result Value Ref Range   Hep C Virus Ab <0.1 0.0 - 0.9 s/co ratio      Assessment & Plan:   Problem List Items Addressed This Visit       Cardiovascular and Mediastinum   Essential hypertension - Primary    Under good control on current regimen. Continue current regimen. Continue to monitor. Call with any concerns. Refills given.        Relevant Orders   Basic metabolic panel     Other   Anxiety    Acting up. Will add abilify and recheck 1 month. Call with any concerns.       Depression, recurrent ([place])    Acting up. Will add abilify and recheck 1 month. Call with any concerns.       Palpitations    Unclear if its due to anxiety, ambien or cardiac. EKG normal. Will add abilify and stop ambien. Will get her into cardiology for evaluation, ?holter. Continue to monitor. Call with any concerns.       Relevant Orders   EKG 12-Lead (Completed)   Ambulatory referral to Cardiology     Follow up plan: Return in about 4 weeks (  around 03/12/2021).

## 2021-02-12 NOTE — Assessment & Plan Note (Signed)
Acting up. Will add abilify and recheck 1 month. Call with any concerns.

## 2021-02-12 NOTE — Assessment & Plan Note (Signed)
Under good control on current regimen. Continue current regimen. Continue to monitor. Call with any concerns. Refills given.   

## 2021-02-13 LAB — BASIC METABOLIC PANEL
BUN/Creatinine Ratio: 12 (ref 9–23)
BUN: 8 mg/dL (ref 6–24)
CO2: 22 mmol/L (ref 20–29)
Calcium: 9.1 mg/dL (ref 8.7–10.2)
Chloride: 104 mmol/L (ref 96–106)
Creatinine, Ser: 0.66 mg/dL (ref 0.57–1.00)
Glucose: 105 mg/dL — ABNORMAL HIGH (ref 70–99)
Potassium: 3.9 mmol/L (ref 3.5–5.2)
Sodium: 140 mmol/L (ref 134–144)
eGFR: 104 mL/min/{1.73_m2} (ref >59)

## 2021-02-19 ENCOUNTER — Ambulatory Visit: Payer: BC Managed Care – PPO | Admitting: Cardiology

## 2021-02-21 NOTE — Progress Notes (Signed)
Interpreted by me on 02/12/21. NSR at 74 bpm. No ST segment changes.

## 2021-02-23 ENCOUNTER — Telehealth: Payer: BC Managed Care – PPO

## 2021-02-24 ENCOUNTER — Telehealth: Payer: Self-pay | Admitting: Licensed Clinical Social Worker

## 2021-02-24 NOTE — Telephone Encounter (Signed)
    Clinical Social Work  Care Management   Phone Outreach    02/24/2021 Name: [name] MRN: [id number] DOB: 02-Jul-1965  Morgan Cisneros is a 55 y.o. year old female who is a primary care patient of Valerie Roys, DO .   Reason for referral: [employer].    F/U phone call today to assess needs, progress and barriers with care plan goals.   3rd unsuccessful telephone outreach was attempted today. The patient was referred to the case management team for assistance with care management and care coordination. The patient's primary care provider has been notified of our unsuccessful attempts to make or maintain contact with the patient. The care management team is pleased to engage with this patient at any time in the future should he/she be interested in assistance from the care management team.   Plan: CCM LCSW will disconnect from care team. Should needs arise, PCP can complete another referral for LCSW services.    Review of patient status, including review of consultants reports, relevant laboratory and other test results, and collaboration with appropriate care team members and the patient's provider was performed as part of comprehensive patient evaluation and provision of care management services.    Christa See, MSW, [email or web address] Phone [phone] 6:39 AM

## 2021-03-07 ENCOUNTER — Other Ambulatory Visit: Payer: Self-pay | Admitting: Family Medicine

## 2021-03-08 NOTE — Telephone Encounter (Signed)
Requested medication (s) are due for refill today: no  Requested medication (s) are on the active medication list: yes  Last refill:  01/02/21 #45 2 RF  Future visit scheduled: yes  Notes to clinic:  med not delegated to NT to RF   Requested Prescriptions  Pending Prescriptions Disp Refills   LORazepam (ATIVAN) 0.5 MG tablet [Pharmacy Med Name: LORAZEPAM 0.5 MG TAB] 45 tablet     Sig: TAKE 1 TABLET BY MOUTH TWICE DAILY AS NEEDED     Not Delegated - Psychiatry:  Anxiolytics/Hypnotics Failed - 03/07/2021 10:13 AM      Failed - This refill cannot be delegated      Failed - Urine Drug Screen completed in last 360 days      Passed - Valid encounter within last 6 months    Recent Outpatient Visits           3 weeks ago Essential hypertension   [place], Megan P, DO   2 months ago Essential hypertension   [place], Megan P, DO   4 months ago Routine general medical examination at a health care facility   [facility], [place], DO   8 months ago Essential hypertension   [place], Megan P, DO   10 months ago Depression, recurrent [facility])   [place], [place], DO       Future Appointments             In 5 days Wynetta Emery, Barb Merino, DO [employer], [place]   In 5 days Ag[place]Et[place], Aaron Edelman, MD [facility], [place]

## 2021-03-10 ENCOUNTER — Encounter: Payer: Self-pay | Admitting: Family Medicine

## 2021-03-13 ENCOUNTER — Other Ambulatory Visit: Payer: Self-pay

## 2021-03-13 ENCOUNTER — Ambulatory Visit (INDEPENDENT_AMBULATORY_CARE_PROVIDER_SITE_OTHER): Payer: BC Managed Care – PPO | Admitting: Family Medicine

## 2021-03-13 ENCOUNTER — Ambulatory Visit: Payer: BC Managed Care – PPO | Admitting: Cardiology

## 2021-03-13 ENCOUNTER — Encounter: Payer: Self-pay | Admitting: Family Medicine

## 2021-03-13 DIAGNOSIS — F419 Anxiety disorder, unspecified: Secondary | ICD-10-CM | POA: Diagnosis not present

## 2021-03-13 DIAGNOSIS — F339 Major depressive disorder, recurrent, unspecified: Secondary | ICD-10-CM | POA: Diagnosis not present

## 2021-03-13 MED ORDER — ARIPIPRAZOLE 5 MG PO TABS: 5.0000 mg | ORAL_TABLET | Freq: Every day | ORAL | 1 refills | Status: DC

## 2021-03-13 MED ORDER — DULOXETINE HCL 60 MG PO CPEP: ORAL_CAPSULE | ORAL | 1 refills | Status: DC

## 2021-03-13 MED ORDER — LORAZEPAM 0.5 MG PO TABS: 0.5000 mg | ORAL_TABLET | Freq: Two times a day (BID) | ORAL | 1 refills | Status: DC | PRN

## 2021-03-13 NOTE — Assessment & Plan Note (Signed)
Stable. Continue current regimen. Continue to monitor. Call with any concerns.  

## 2021-03-13 NOTE — Progress Notes (Signed)
BP 135/89   Pulse 80   Temp 98.3 F (36.8 C)   Wt 150 lb 3.2 oz (68.1 kg)   SpO2 98%   BMI 24.99 kg/m    Subjective:    Patient ID: Morgan Cisneros, female    DOB: 1965-12-23, 55 y.o.   MRN: [id number]  HPI: Morgan Cisneros is a 55 y.o. female  Chief Complaint  Patient presents with   Depression   Anxiety   ANXIETY/DEPRESSION Duration: about a year Status:exacerbated Anxious mood: yes  Excessive worrying: yes Irritability: no  Sweating: no Nausea: no Palpitations:no Hyperventilation: no Panic attacks: no Agoraphobia: no  Obscessions/compulsions: no Depressed mood: yes Depression screen [facility] 2/9 03/13/2021 02/12/2021 10/30/2020 07/10/2020 05/08/2020  Decreased Interest _0 Down, Depressed, Hopeless _1 PHQ - 2 Score _2 Altered sleeping _3 Tired, decreased energy _4 Change in appetite 2 0 0 - 0  Feeling bad or failure about yourself  2 2 0 2 2  Trouble concentrating _5 Moving slowly or fidgety/restless 0 0 0 0 0  Suicidal thoughts 0 0 0 0 0  PHQ-9 Score _6 Difficult doing work/chores - Not difficult at all Very difficult Not difficult at all Not difficult at all  Some recent data might be hidden   GAD 7 : Generalized Anxiety Score 03/13/2021 02/12/2021 10/30/2020 07/10/2020  Nervous, Anxious, on Edge _7 Control/stop worrying _8 Worry too much - different things _9 Trouble relaxing _10 Restless _11 Easily annoyed or irritable 0 0 0 0  Afraid - awful might happen _12 Total GAD 7 Score _13 Anxiety Difficulty - Not difficult at all Very difficult Not difficult at all   Anhedonia: no Weight changes: no Insomnia: no   Hypersomnia: no Fatigue/loss of energy: yes Feelings of worthlessness: no Feelings of guilt: yes Impaired concentration/indecisiveness: no Suicidal ideations: no  Crying spells: yes Recent Stressors/Life Changes: yes   Relationship problems:  no   Family stress: no     Financial stress: no    Job stress: yes    Recent death/loss: yes  Relevant past medical, surgical, family and social history reviewed and updated as indicated. Interim medical history since our last visit reviewed. Allergies and medications reviewed and updated.  Review of Systems  Constitutional: Negative.   Respiratory: Negative.    Cardiovascular: Negative.   Musculoskeletal: Negative.   Neurological: Negative.   Psychiatric/Behavioral: Negative.     Per HPI unless specifically indicated above     Objective:    BP 135/89   Pulse 80   Temp 98.3 F (36.8 C)   Wt 150 lb 3.2 oz (68.1 kg)   SpO2 98%   BMI 24.99 kg/m   Wt Readings from Last 3 Encounters:  03/13/21 150 lb 3.2 oz (68.1 kg)  02/12/21 149 lb 6.4 oz (67.8 kg)  10/30/20 148 lb (67.1 kg)    Physical Exam Vitals and nursing note reviewed.  Constitutional:      General: She is not in acute distress.    Appearance: Normal appearance. She is not ill-appearing, toxic-appearing or diaphoretic.  HENT:     Head: Normocephalic and atraumatic.     Right  Ear: External ear normal.     Left Ear: External ear normal.     Nose: Nose normal.     Mouth/Throat:     Mouth: Mucous membranes are moist.     Pharynx: Oropharynx is clear.  Eyes:     General: No scleral icterus.       Right eye: No discharge.        Left eye: No discharge.     Extraocular Movements: Extraocular movements intact.     Conjunctiva/sclera: Conjunctivae normal.     Pupils: Pupils are equal, round, and reactive to light.  Cardiovascular:     Rate and Rhythm: Normal rate and regular rhythm.     Pulses: Normal pulses.     Heart sounds: Normal heart sounds. No murmur heard.   No friction rub. No gallop.  Pulmonary:     Effort: Pulmonary effort is normal. No respiratory distress.     Breath sounds: Normal breath sounds. No stridor. No wheezing, rhonchi or rales.  Chest:     Chest wall: No tenderness.  Musculoskeletal:         General: Normal range of motion.     Cervical back: Normal range of motion and neck supple.  Skin:    General: Skin is warm and dry.     Capillary Refill: Capillary refill takes less than 2 seconds.     Coloration: Skin is not jaundiced or pale.     Findings: No bruising, erythema, lesion or rash.  Neurological:     General: No focal deficit present.     Mental Status: She is alert and oriented to person, place, and time. Mental status is at baseline.  Psychiatric:        Mood and Affect: Mood normal.        Behavior: Behavior normal.        Thought Content: Thought content normal.        Judgment: Judgment normal.    Results for orders placed or performed in visit on 55/73/22  Basic metabolic panel  Result Value Ref Range   Glucose 105 (H) 70 - 99 mg/dL   BUN 8 6 - 24 mg/dL   Creatinine, Ser 0.66 0.57 - 1.00 mg/dL   eGFR 104 >59 mL/min/1.73   BUN/Creatinine Ratio 12 9 - 23   Sodium 140 134 - 144 mmol/L   Potassium 3.9 3.5 - 5.2 mmol/L   Chloride 104 96 - 106 mmol/L   CO2 22 20 - 29 mmol/L   Calcium 9.1 8.7 - 10.2 mg/dL      Assessment & Plan:   Problem List Items Addressed This Visit       Other   Anxiety    Stable. Continue current regimen. Continue to monitor. Call with any concerns.       Relevant Medications   DULoxetine (CYMBALTA) 60 MG capsule   LORazepam (ATIVAN) 0.5 MG tablet (Start on 04/10/2021)   Depression, recurrent (HCC)    Stable. Continue current regimen. Continue to monitor. Call with any concerns.       Relevant Medications   DULoxetine (CYMBALTA) 60 MG capsule   LORazepam (ATIVAN) 0.5 MG tablet (Start on 04/10/2021)     Follow up plan: Return in about 3 months (around 06/11/2021).

## 2021-03-23 ENCOUNTER — Encounter: Payer: Self-pay | Admitting: Family Medicine

## 2021-03-23 MED ORDER — LORAZEPAM 0.5 MG PO TABS: 0.5000 mg | ORAL_TABLET | Freq: Two times a day (BID) | ORAL | 1 refills | Status: DC | PRN

## 2021-03-26 ENCOUNTER — Other Ambulatory Visit: Payer: Self-pay | Admitting: Family Medicine

## 2021-03-26 NOTE — Telephone Encounter (Signed)
Rx was sent to pharmacy earlier this week. Please confirm they got it.

## 2021-03-26 NOTE — Telephone Encounter (Signed)
Requested medication (s) are due for refill today - no  Requested medication (s) are on the active medication list -yes  Future visit scheduled -yes  Last refill: 03/23/21 #45 1RF  Notes to clinic: Request RF: Duplicate request, non delegated Rx  Requested Prescriptions  Pending Prescriptions Disp Refills   LORazepam (ATIVAN) 0.5 MG tablet [Pharmacy Med Name: LORAZEPAM 0.5 MG TAB] 45 tablet     Sig: TAKE 1 TABLET BY MOUTH TWICE DAILY AS NEEDED     Not Delegated - Psychiatry:  Anxiolytics/Hypnotics Failed - 03/26/2021 12:09 PM      Failed - This refill cannot be delegated      Failed - Urine Drug Screen completed in last 360 days      Passed - Valid encounter within last 6 months    Recent Outpatient Visits           1 week ago Depression, recurrent ([place])   [place], [place], DO   1 month ago Essential hypertension   [place], Nettie, DO   2 months ago Essential hypertension   [place], Megan P, DO   4 months ago Routine general medical examination at a health care facility   [facility], [place] P, DO   8 months ago Essential hypertension   [place], Barb Merino, DO       Future Appointments             In 2 months [place], Megan P, DO [place], [facility]               Requested Prescriptions  Pending Prescriptions Disp Refills   LORazepam (ATIVAN) 0.5 MG tablet [Pharmacy Med Name: LORAZEPAM 0.5 MG TAB] 45 tablet     Sig: TAKE 1 TABLET BY MOUTH TWICE DAILY AS NEEDED     Not Delegated - Psychiatry:  Anxiolytics/Hypnotics Failed - 03/26/2021 12:09 PM      Failed - This refill cannot be delegated      Failed - Urine Drug Screen completed in last 360 days      Passed - Valid encounter within last 6 months    Recent Outpatient Visits           1 week ago Depression, recurrent ([place])   [place], [place], DO   1  month ago Essential hypertension   [place], [place], DO   2 months ago Essential hypertension   [place], Megan P, DO   4 months ago Routine general medical examination at a health care facility   [facility], [place], DO   8 months ago Essential hypertension   [place], Barb Merino, DO       Future Appointments             In 2 months Wynetta Emery, Barb Merino, DO [employer], [facility]

## 2021-03-26 NOTE — Telephone Encounter (Signed)
Please see below. Please call pharmacy

## 2021-04-07 ENCOUNTER — Other Ambulatory Visit: Payer: Self-pay | Admitting: Family Medicine

## 2021-04-07 NOTE — Telephone Encounter (Signed)
Requested medication (s) are due for refill today: yes  Requested medication (s) are on the active medication list: yes  Last refill:  04/02/21 #45 with 1 refill for 04/10/21  Future visit scheduled: yes  Notes to clinic:  Please review. Refill not delegated per protocol. Unable to approve or deny    Requested Prescriptions  Pending Prescriptions Disp Refills   LORazepam (ATIVAN) 0.5 MG tablet [Pharmacy Med Name: LORAZEPAM 0.5 MG TAB] 45 tablet     Sig: TAKE 1 TABLET BY MOUTH TWICE DAILY AS NEEDED     Not Delegated - Psychiatry:  Anxiolytics/Hypnotics Failed - 04/07/2021 10:17 AM      Failed - This refill cannot be delegated      Failed - Urine Drug Screen completed in last 360 days      Passed - Valid encounter within last 6 months    Recent Outpatient Visits           3 weeks ago Depression, recurrent ([place])   [place], [place], DO   1 month ago Essential hypertension   [place], [place], DO   3 months ago Essential hypertension   [place], Megan P, DO   5 months ago Routine general medical examination at a health care facility   [facility], [place] P, DO   9 months ago Essential hypertension   [place], Barb Merino, DO       Future Appointments             In 2 months Wynetta Emery, Barb Merino, DO [employer], [facility]

## 2021-04-13 ENCOUNTER — Encounter: Payer: Self-pay | Admitting: Family Medicine

## 2021-04-13 ENCOUNTER — Other Ambulatory Visit: Payer: Self-pay | Admitting: Family Medicine

## 2021-04-14 ENCOUNTER — Other Ambulatory Visit: Payer: Self-pay

## 2021-04-14 MED ORDER — LISINOPRIL 5 MG PO TABS: 5.0000 mg | ORAL_TABLET | Freq: Every day | ORAL | 0 refills | Status: DC

## 2021-04-14 NOTE — Telephone Encounter (Signed)
°   Requested medication (s) are on the active medication list: yes  Last refill:  I believe this is an old rx, filled 05/10/20, has newer rx since then but does not show pharm received.  Future visit scheduled: 06/12/21  Notes to clinic:  has newer rx if received by pharm, please assess.  Requested Prescriptions  Pending Prescriptions Disp Refills   lisinopril (ZESTRIL) 5 MG tablet [Pharmacy Med Name: LISINOPRIL 5 MG TAB] 90 tablet     Sig: TAKE 1 TABLET BY MOUTH ONCE DAILY     Cardiovascular:  ACE Inhibitors Passed - 04/13/2021  5:11 PM      Passed - Cr in normal range and within 180 days    Creatinine  Date Value Ref Range Status  08/14/2013 0.67 0.60 - 1.30 mg/dL Final   Creatinine, Ser  Date Value Ref Range Status  02/12/2021 0.66 0.57 - 1.00 mg/dL Final          Passed - K in normal range and within 180 days    Potassium  Date Value Ref Range Status  02/12/2021 3.9 3.5 - 5.2 mmol/L Final  08/14/2013 4.0 3.5 - 5.1 mmol/L Final          Passed - Patient is not pregnant      Passed - Last BP in normal range    BP Readings from Last 1 Encounters:  03/13/21 135/89          Passed - Valid encounter within last 6 months    Recent Outpatient Visits           1 month ago Depression, recurrent ([place])   [place], Megan P, DO   2 months ago Essential hypertension   [place], [place], DO   3 months ago Essential hypertension   [place], Megan P, DO   5 months ago Routine general medical examination at a health care facility   [facility], [place], DO   9 months ago Essential hypertension   [place], [place], DO       Future Appointments             In 1 month [place], Barb Merino, DO [employer], [facility]

## 2021-05-20 ENCOUNTER — Encounter: Payer: Self-pay | Admitting: Family Medicine

## 2021-05-20 ENCOUNTER — Telehealth: Payer: BC Managed Care – PPO | Admitting: Family Medicine

## 2021-05-20 DIAGNOSIS — J040 Acute laryngitis: Secondary | ICD-10-CM | POA: Diagnosis not present

## 2021-05-20 MED ORDER — PREDNISONE 50 MG PO TABS: 50.0000 mg | ORAL_TABLET | Freq: Every day | ORAL | 0 refills | Status: DC

## 2021-05-20 MED ORDER — HYDROCOD POLI-CHLORPHE POLI ER 10-8 MG/5ML PO SUER: 5.0000 mL | Freq: Two times a day (BID) | ORAL | 0 refills | Status: DC | PRN

## 2021-05-20 MED ORDER — BENZONATATE 200 MG PO CAPS: 200.0000 mg | ORAL_CAPSULE | Freq: Two times a day (BID) | ORAL | 0 refills | Status: DC | PRN

## 2021-05-20 NOTE — Progress Notes (Signed)
There were no vitals taken for this visit.   Subjective:    Patient ID: Morgan Cisneros, female    DOB: 11/26/1965, 56 y.o.   MRN: [id number]  HPI: Morgan Cisneros is a 56 y.o. female  Chief Complaint  Patient presents with   Sore Throat    Patient states she began having a ST on Saturday.    Cough    Patient states she has been coughing since Saturday. Patient states her grandkids were sick with colds. Has been taking mucinex and tylenol.    UPPER RESPIRATORY TRACT INFECTION Duration: 3-4 days Worst symptom: cough and laryngitis Fever: no Cough: yes Shortness of breath: no Wheezing: no Chest pain: no Chest tightness: yes Chest congestion: no Nasal congestion: yes Runny nose: yes Post nasal drip: yes Sneezing: no Sore throat: yes Swollen glands: no Sinus pressure: yes Headache: no Face pain: no Toothache: no Ear pain: no  Ear pressure: no  Eyes red/itching:no Eye drainage/crusting: no  Vomiting: no Rash: no Fatigue: yes Sick contacts: yes Strep contacts: yes  Context: better Recurrent sinusitis: no Relief with OTC cold/cough medications: no  Treatments attempted: mucinex, tylenol   Relevant past medical, surgical, family and social history reviewed and updated as indicated. Interim medical history since our last visit reviewed. Allergies and medications reviewed and updated.  Review of Systems  Constitutional:  Positive for fatigue. Negative for activity change, appetite change, chills, diaphoresis, fever and unexpected weight change.  HENT:  Positive for congestion and sore throat. Negative for dental problem, drooling, ear discharge, ear pain, facial swelling, hearing loss, mouth sores, nosebleeds, postnasal drip, rhinorrhea, sinus pressure, sinus pain, sneezing, tinnitus, trouble swallowing and voice change.   Eyes: Negative.   Respiratory:  Positive for cough. Negative for apnea, choking, chest tightness, shortness of breath, wheezing and stridor.    Cardiovascular: Negative.   Gastrointestinal: Negative.   Musculoskeletal: Negative.   Psychiatric/Behavioral: Negative.     Per HPI unless specifically indicated above     Objective:    There were no vitals taken for this visit.  Wt Readings from Last 3 Encounters:  03/13/21 150 lb 3.2 oz (68.1 kg)  02/12/21 149 lb 6.4 oz (67.8 kg)  10/30/20 148 lb (67.1 kg)    Physical Exam Vitals and nursing note reviewed.  Constitutional:      General: She is not in acute distress.    Appearance: Normal appearance. She is well-developed and normal weight. She is not ill-appearing, toxic-appearing or diaphoretic.  HENT:     Head: Normocephalic and atraumatic.     Right Ear: External ear normal.     Left Ear: External ear normal.     Nose: Nose normal.     Mouth/Throat:     Mouth: Mucous membranes are moist.     Pharynx: Oropharynx is clear.  Eyes:     General: No scleral icterus.       Right eye: No discharge.        Left eye: No discharge.     Conjunctiva/sclera: Conjunctivae normal.     Pupils: Pupils are equal, round, and reactive to light.  Pulmonary:     Effort: Pulmonary effort is normal. No respiratory distress.     Comments: Speaking in full sentences Musculoskeletal:        General: Normal range of motion.     Cervical back: Normal range of motion.  Skin:    Coloration: Skin is not jaundiced or pale.     Findings: No  bruising, erythema, lesion or rash.  Neurological:     Mental Status: She is alert and oriented to person, place, and time. Mental status is at baseline.  Psychiatric:        Mood and Affect: Mood normal.        Behavior: Behavior normal.        Thought Content: Thought content normal.        Judgment: Judgment normal.    Results for orders placed or performed in visit on 51/83/35  Basic metabolic panel  Result Value Ref Range   Glucose 105 (H) 70 - 99 mg/dL   BUN 8 6 - 24 mg/dL   Creatinine, Ser 0.66 0.57 - 1.00 mg/dL   eGFR 104 >59 mL/min/1.73    BUN/Creatinine Ratio 12 9 - 23   Sodium 140 134 - 144 mmol/L   Potassium 3.9 3.5 - 5.2 mmol/L   Chloride 104 96 - 106 mmol/L   CO2 22 20 - 29 mmol/L   Calcium 9.1 8.7 - 10.2 mg/dL      Assessment & Plan:   Problem List Items Addressed This Visit   None Visit Diagnoses     Laryngitis    -  Primary   Will treat with prednisone and tessalon and tussionex. Call if not getting better or getting worse.         Follow up plan: Return if symptoms worsen or fail to improve.   This visit was completed via video visit through MyChart due to the restrictions of the COVID-19 pandemic. All issues as above were discussed and addressed. Physical exam was done as above through visual confirmation on video through MyChart. If it was felt that the patient should be evaluated in the office, they were directed there. The patient verbally consented to this visit. Location of the patient: home Location of the provider: work Those involved with this call:  Provider: Park Liter, DO CMA: Louanna Raw, [place] Desk/Registration: [employer]  Time spent on call:  15 minutes with patient face to face via video conference. More than 50% of this time was spent in counseling and coordination of care. 23 minutes total spent in review of patient's record and preparation of their chart.

## 2021-06-04 ENCOUNTER — Encounter: Payer: Self-pay | Admitting: Family Medicine

## 2021-06-05 MED ORDER — LORAZEPAM 0.5 MG PO TABS: 0.5000 mg | ORAL_TABLET | Freq: Two times a day (BID) | ORAL | 0 refills | Status: DC | PRN

## 2021-06-12 ENCOUNTER — Ambulatory Visit (INDEPENDENT_AMBULATORY_CARE_PROVIDER_SITE_OTHER): Payer: BC Managed Care – PPO | Admitting: Family Medicine

## 2021-06-12 ENCOUNTER — Encounter: Payer: Self-pay | Admitting: Family Medicine

## 2021-06-12 ENCOUNTER — Other Ambulatory Visit: Payer: Self-pay

## 2021-06-12 VITALS — BP 148/92 | HR 81 | Wt 149.2 lb

## 2021-06-12 DIAGNOSIS — I10 Essential (primary) hypertension: Secondary | ICD-10-CM | POA: Diagnosis not present

## 2021-06-12 DIAGNOSIS — R0683 Snoring: Secondary | ICD-10-CM | POA: Diagnosis not present

## 2021-06-12 DIAGNOSIS — F419 Anxiety disorder, unspecified: Secondary | ICD-10-CM

## 2021-06-12 DIAGNOSIS — F339 Major depressive disorder, recurrent, unspecified: Secondary | ICD-10-CM | POA: Diagnosis not present

## 2021-06-12 MED ORDER — METOPROLOL SUCCINATE ER 100 MG PO TB24: ORAL_TABLET | ORAL | 1 refills | Status: DC

## 2021-06-12 MED ORDER — LISINOPRIL 10 MG PO TABS: 10.0000 mg | ORAL_TABLET | Freq: Every day | ORAL | 3 refills | Status: DC

## 2021-06-12 MED ORDER — LORAZEPAM 0.5 MG PO TABS: 0.5000 mg | ORAL_TABLET | Freq: Two times a day (BID) | ORAL | 2 refills | Status: DC | PRN

## 2021-06-12 NOTE — Assessment & Plan Note (Signed)
Running high. Will increase lisinopril to '10mg'$  and recheck 1 month. Call with any concerns.  ?

## 2021-06-12 NOTE — Assessment & Plan Note (Signed)
Stable. Will continue current regimen. Continue to monitor. Refills given. Lorazepam should last until next visit.  ?

## 2021-06-12 NOTE — Progress Notes (Signed)
? ?BP (!) 148/92   Pulse 81   Wt 149 lb 3.2 oz (67.7 kg)   SpO2 98%   BMI 24.83 kg/m?   ? ?Subjective:  ? ? Patient ID: Morgan Cisneros, female    DOB: 1965-09-29, 56 y.o.   MRN: [id number] ? ?HPI: ?Morgan Cisneros is a 56 y.o. female ? ?Chief Complaint  ?Patient presents with  ? Anxiety  ? Depression  ? Hypertension  ?  Patient states she has been taking her BP at home and some days it has been running high. Patient notices shes been having headaches when its high.   ? ?HYPERTENSION ?Hypertension status: exacerbated  ?Satisfied with current treatment? no ?Duration of hypertension: chronic ?BP monitoring frequency:  monthly ?BP medication side effects:  no ?Medication compliance: excellent compliance ?Previous BP meds:linosinopril, metoprolol ?Aspirin: no ?Recurrent headaches: yes ?Visual changes: no ?Palpitations: no ?Dyspnea: no ?Chest pain: no ?Lower extremity edema: no ?Dizzy/lightheaded: no ? ?ANXIETY/STRESS ?Duration: chronic ?Status:stable ?Anxious mood: yes  ?Excessive worrying: yes ?Irritability: yes  ?Sweating: no ?Nausea: no ?Palpitations:no ?Hyperventilation: no ?Panic attacks: yes ?Agoraphobia: no  ?Obscessions/compulsions: no ?Depressed mood: yes ?Depression screen [facility] 2/9 06/12/2021 03/13/2021 02/12/2021 10/30/2020 07/10/2020  ?Decreased Interest _0 ?Down, Depressed, Hopeless _1 ?PHQ - 2 Score _2 ?Altered sleeping _3 ?Tired, decreased energy _4 ?Change in appetite 1 2 0 0 -  ?Feeling bad or failure about yourself  _5 0 2  ?Trouble concentrating _6 ?Moving slowly or fidgety/restless 0 0 0 0 0  ?Suicidal thoughts 0 0 0 0 0  ?PHQ-9 Score _7 ?Difficult doing work/chores - - Not difficult at all Very difficult Not difficult at all  ?Some recent data might be hidden  ? ?GAD 7 : Generalized Anxiety Score 06/12/2021 03/13/2021 02/12/2021 10/30/2020  ?Nervous, Anxious, on Edge _8 ?Control/stop worrying _9 ?Worry too much - different  things _10 ?Trouble relaxing _11 ?Restless _12 ?Easily annoyed or irritable 0 0 0 0  ?Afraid - awful might happen _13 ?Total GAD 7 Score _14 ?Anxiety Difficulty Somewhat difficult - Not difficult at all Very difficult  ? ?Anhedonia: no ?Weight changes: no ?Insomnia: yes hard to fall asleep  ?Hypersomnia: no ?Fatigue/loss of energy: yes ?Feelings of worthlessness: yes ?Feelings of guilt: yes ?Impaired concentration/indecisiveness: yes ?Suicidal ideations: no  ?Crying spells: yes ?Recent Stressors/Life Changes: no ?  Relationship problems: no ?  Family stress: yes   ?  Financial stress: no  ?  Job stress: no  ?  Recent death/loss: yes ? ???????SLEEP APNEA ?Sleep apnea status: unknown- concern for ?Duration: chronic ?Satisfied with current treatment?:  not on anything ?CPAP use:  no ?Wakes feeling refreshed:  no ?Daytime hypersomnolence:  yes ?Fatigue:  yes ?Insomnia:  yes ?Good sleep hygiene:  yes ?Difficulty falling asleep:  yes ?Difficulty staying asleep:  yes ?Snoring bothers bed partner:  yes ?Observed apnea by bed partner: yes ?Obesity:  no ?Hypertension: yes  ?Pulmonary hypertension:  no ?Coronary artery disease:  no ? ? ?Relevant past medical, surgical, family and social history reviewed and updated as indicated. Interim medical history since our last visit reviewed. ?Allergies and medications reviewed and  updated. ? ?Review of Systems  ?Constitutional: Negative.   ?Respiratory: Negative.    ?Cardiovascular: Negative.   ?Gastrointestinal: Negative.   ?Musculoskeletal: Negative.   ?Neurological: Negative.   ?Psychiatric/Behavioral:  Positive for dysphoric mood and sleep disturbance. Negative for agitation, behavioral problems, confusion, decreased concentration, hallucinations, self-injury and suicidal ideas. The patient is nervous/anxious. The patient is not hyperactive.   ? ?Per HPI unless specifically indicated above ? ?   ?Objective:  ?  ?BP (!) 148/92   Pulse 81   Wt 149 lb 3.2  oz (67.7 kg)   SpO2 98%   BMI 24.83 kg/m?   ?Wt Readings from Last 3 Encounters:  ?06/12/21 149 lb 3.2 oz (67.7 kg)  ?03/13/21 150 lb 3.2 oz (68.1 kg)  ?02/12/21 149 lb 6.4 oz (67.8 kg)  ?  ?Physical Exam ?Vitals and nursing note reviewed.  ?Constitutional:   ?   General: She is not in acute distress. ?   Appearance: Normal appearance. She is not ill-appearing, toxic-appearing or diaphoretic.  ?HENT:  ?   Head: Normocephalic and atraumatic.  ?   Right Ear: External ear normal.  ?   Left Ear: External ear normal.  ?   Nose: Nose normal.  ?   Mouth/Throat:  ?   Mouth: Mucous membranes are moist.  ?   Pharynx: Oropharynx is clear.  ?Eyes:  ?   General: No scleral icterus.    ?   Right eye: No discharge.     ?   Left eye: No discharge.  ?   Extraocular Movements: Extraocular movements intact.  ?   Conjunctiva/sclera: Conjunctivae normal.  ?   Pupils: Pupils are equal, round, and reactive to light.  ?Cardiovascular:  ?   Rate and Rhythm: Normal rate and regular rhythm.  ?   Pulses: Normal pulses.  ?   Heart sounds: Normal heart sounds. No murmur heard. ?  No friction rub. No gallop.  ?Pulmonary:  ?   Effort: Pulmonary effort is normal. No respiratory distress.  ?   Breath sounds: Normal breath sounds. No stridor. No wheezing, rhonchi or rales.  ?Chest:  ?   Chest wall: No tenderness.  ?Musculoskeletal:     ?   General: Normal range of motion.  ?   Cervical back: Normal range of motion and neck supple.  ?Skin: ?   General: Skin is warm and dry.  ?   Capillary Refill: Capillary refill takes less than 2 seconds.  ?   Coloration: Skin is not jaundiced or pale.  ?   Findings: No bruising, erythema, lesion or rash.  ?Neurological:  ?   General: No focal deficit present.  ?   Mental Status: She is alert and oriented to person, place, and time. Mental status is at baseline.  ?Psychiatric:     ?   Mood and Affect: Mood normal.     ?   Behavior: Behavior normal.     ?   Thought Content: Thought content normal.     ?   Judgment:  Judgment normal.  ? ? ?Results for orders placed or performed in visit on 02/12/21  ?Basic metabolic panel  ?Result Value Ref Range  ? Glucose 105 (H) 70 - 99 mg/dL  ? BUN 8 6 - 24 mg/dL  ? Creatinine, Ser 0.66 0.57 - 1.00 mg/dL  ? eGFR 104 >59 mL/min/1.73  ? BUN/Creatinine Ratio 12 9 - 23  ? Sodium 140 134 - 144 mmol/L  ? Potassium 3.9 3.5 - 5.2  mmol/L  ? Chloride 104 96 - 106 mmol/L  ? CO2 22 20 - 29 mmol/L  ? Calcium 9.1 8.7 - 10.2 mg/dL  ? ?   ?Assessment & Plan:  ? ?Problem List Items Addressed This Visit   ? ?  ? Cardiovascular and Mediastinum  ? Essential hypertension - Primary  ?  Running high. Will increase lisinopril to 53m and recheck 1 month. Call with any concerns.  ?  ?  ? Relevant Medications  ? metoprolol succinate (TOPROL-XL) 100 MG 24 hr tablet  ? lisinopril (ZESTRIL) 10 MG tablet  ? Other Relevant Orders  ? Basic metabolic panel  ?  ? Other  ? Anxiety  ?  Stable. Will continue current regimen. Continue to monitor. Refills given. Lorazepam should last until next visit.  ?  ?  ? Relevant Medications  ? LORazepam (ATIVAN) 0.5 MG tablet  ? Depression, recurrent [place]  ?  Stable. Will continue current regimen. Continue to monitor. Refills given. Lorazepam should last until next visit.  ?  ?  ? Relevant Medications  ? LORazepam (ATIVAN) 0.5 MG tablet  ? ?Other Visit Diagnoses   ? ? Snoring      ? Concern for OSA. Referral for sleep study placed today.  ? Relevant Orders  ? Ambulatory referral to Sleep Studies  ? ?  ?  ? ?Follow up plan: ?Return in about 4 weeks (around 07/10/2021) for blood pressure follow up. ? ? ? ? ? ?

## 2021-06-13 LAB — BASIC METABOLIC PANEL
BUN/Creatinine Ratio: 19 (ref 9–23)
BUN: 14 mg/dL (ref 6–24)
CO2: 22 mmol/L (ref 20–29)
Calcium: 9.4 mg/dL (ref 8.7–10.2)
Chloride: 107 mmol/L — ABNORMAL HIGH (ref 96–106)
Creatinine, Ser: 0.75 mg/dL (ref 0.57–1.00)
Glucose: 88 mg/dL (ref 70–99)
Potassium: 4 mmol/L (ref 3.5–5.2)
Sodium: 146 mmol/L — ABNORMAL HIGH (ref 134–144)
eGFR: 95 mL/min/{1.73_m2} (ref >59)

## 2021-06-14 ENCOUNTER — Ambulatory Visit
Admission: EM | Admit: 2021-06-14 | Discharge: 2021-06-14 | Disposition: A | Discharge status: Home / Self Care | Payer: BC Managed Care – PPO | Attending: Emergency Medicine | Admitting: Emergency Medicine

## 2021-06-14 ENCOUNTER — Other Ambulatory Visit: Payer: Self-pay

## 2021-06-14 ENCOUNTER — Ambulatory Visit (INDEPENDENT_AMBULATORY_CARE_PROVIDER_SITE_OTHER): Payer: BC Managed Care – PPO

## 2021-06-14 DIAGNOSIS — M79631 Pain in right forearm: Secondary | ICD-10-CM

## 2021-06-14 DIAGNOSIS — W540XXA Bitten by dog, initial encounter: Secondary | ICD-10-CM | POA: Diagnosis not present

## 2021-06-14 DIAGNOSIS — S51831A Puncture wound without foreign body of right forearm, initial encounter: Secondary | ICD-10-CM | POA: Diagnosis not present

## 2021-06-14 MED ORDER — CEFTRIAXONE SODIUM 1 G IJ SOLR
1.0000 g | Freq: Once | INTRAMUSCULAR | Status: AC
  Administered 2021-06-14: 1 g via INTRAMUSCULAR

## 2021-06-14 MED ORDER — ONDANSETRON 8 MG PO TBDP
8.0000 mg | ORAL_TABLET | Freq: Once | ORAL | Status: AC
  Administered 2021-06-14: 8 mg via ORAL

## 2021-06-14 NOTE — ED Triage Notes (Signed)
[place] (during injury): Morgan Cisneros), Phone Number [phone] (dog belongs to him, employee of [employer]). ?Happened at patients home (address on file).  ?

## 2021-06-14 NOTE — ED Provider Notes (Signed)
HPI  SUBJECTIVE:  Morgan Cisneros is a right-handed 56 y.o. female who presents with dog bite to the right forearm sustained earlier this morning at 0200.  This was her son's police dog, who is a narcotics dog.  The dog was on a leash, came up to her, and she reached down to pet it, and it bit her.  She reports throbbing, constant, severe pain, swelling of her forearm extending to the base of her wrist.  She states that she cannot make a fist or straighten her hand secondary to pain.  She can move her fingers and wrist, but it hurts.  She reports the limitation of range of motion of the wrist and fingers because of pain.  No fevers, body aches, nausea, vomiting, purulent drainage from the wounds.  She washed it with soap and water, cleaned it with peroxide and took 800 mg of ibuprofen with 1000 mg of Tylenol at 0900 today.  The Tylenol and ibuprofen helped.  Symptoms are worse when she tries to make a fist, extend her fingers or with any movement of her wrist or hand.  She has a past medical history of hypertension.  No history of diabetes, immunocompromise.  Her tetanus was updated last year.  PCP: [facility].   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, PASH   BREAST BIOPSY Right 01/17/2020   no clip due to pt condition during biopsy-neg   BREAST BIOPSY Right  02/07/2020   re-biopsy due to prior biopsy/ coil clip/ path pending   COLONOSCOPY WITH PROPOFOL N/A 04/25/2017   Procedure: COLONOSCOPY WITH PROPOFOL;  Surgeon: Lucilla Lame, MD;  Location: [place];  Service: Endoscopy;  Laterality: N/A;   CYSTOSCOPY  11/26/2010   with stent placement   ESOPHAGOGASTRODUODENOSCOPY (EGD) WITH PROPOFOL N/A 04/25/2017   Procedure: ESOPHAGOGASTRODUODENOSCOPY (EGD) WITH PROPOFOL;  Surgeon: Lucilla Lame, MD;  Location: [place];  Service: Endoscopy;  Laterality: N/A;   HEMORRHOID SURGERY     kidney stent     LAPAROSCOPIC APPENDECTOMY N/A 02/02/2017   Procedure: APPENDECTOMY LAPAROSCOPIC;  Surgeon: Clayburn Pert, MD;  Location: [facility];  Service: General;  Laterality: N/A;   LAPAROSCOPIC SIGMOID COLECTOMY N/A 11/01/2016   Procedure: LAPAROSCOPIC SIGMOID COLECTOMY;  Surgeon: Clayburn Pert, MD;  Location: [facility];  Service: General;  Laterality: N/A;   LITHOTRIPSY     X 4   POLYPECTOMY  04/25/2017   Procedure: POLYPECTOMY INTESTINAL;  Surgeon: Lucilla Lame, MD;  Location: [place];  Service: Endoscopy;;   TONSILLECTOMY     TOTAL ABDOMINAL HYSTERECTOMY  08/2013   TOTAL ABDOMINAL HYSTERECTOMY W/ BILATERAL SALPINGOOPHORECTOMY     TUBAL LIGATION     Ureteroscopy Right 11/21/1997   with holmium laser  Family History  Problem Relation Age of Onset   Kidney Stones Mother    Ovarian cancer Mother    Colon cancer Mother    Lung cancer Father    Hyperlipidemia Sister    Hypertension Sister    Hypertension Sister    Hyperlipidemia Sister    Alzheimer's disease Maternal Grandmother    Cancer Maternal Grandfather        Liver   Alzheimer's disease Paternal Grandfather    Breast cancer Neg Hx     Social History   Tobacco Use   Smoking status: Every Day    Packs/day: 0.50    Types: Cigarettes   Smokeless tobacco: Never  Vaping Use   Vaping Use: Never used  Substance Use Topics   Alcohol use: Yes     Alcohol/week: 0.0 standard drinks    Comment: occasional   Drug use: No     Current Facility-Administered Medications:    ondansetron (ZOFRAN-ODT) disintegrating tablet 8 mg, 8 mg, Oral, Once, Melynda Ripple, MD  Current Outpatient Medications:    DULoxetine (CYMBALTA) 60 MG capsule, TAKE 1 CAPSULE(60 MG) BY MOUTH DAILY, Disp: 90 capsule, Rfl: 1   lisinopril (ZESTRIL) 10 MG tablet, Take 1 tablet (10 mg total) by mouth daily., Disp: 30 tablet, Rfl: 3   LORazepam (ATIVAN) 0.5 MG tablet, Take 1 tablet (0.5 mg total) by mouth 2 (two) times daily as needed., Disp: 45 tablet, Rfl: 2   metoprolol succinate (TOPROL-XL) 100 MG 24 hr tablet, TAKE 1 TABLET(100 MG) BY MOUTH DAILY, Disp: 90 tablet, Rfl: 1  Allergies  Allergen Reactions   Wellbutrin [Bupropion] Other (See Comments)    irritiability   Urocit-K [Potassium Citrate] Nausea Only     ROS  As noted in HPI.   Physical Exam  BP 132/75 (BP Location: Left Arm)    Pulse 84    Temp 98.4 F (36.9 C) (Oral)    Resp 18    Wt 67.6 kg    SpO2 99%    BMI 24.79 kg/m   Constitutional: Well developed, well nourished, no acute distress Eyes:  EOMI, conjunctiva normal bilaterally HENT: Normocephalic, atraumatic,mucus membranes moist Respiratory: Normal inspiratory effort Cardiovascular: Normal rate.  RP 2+. GI: nondistended skin:  Puncture wound volar aspect right forearm with erythema streaking down to the wrist.  Positive forearm swelling.  Diffuse exquisite tenderness .  Sensation light touch and temperature intact over the hand.  18 x 9 cm area of erythema streaking to the wrist. marked area of erythema with a marker for reference.  No appreciable subcutaneous crepitus.  Pain with passive extension of the fingers, flexion of the fingers, slight passive range of motion with the wrist.   Puncture wound with several abrasions dorsal aspect right forearm.     Musculoskeletal: no deformities Neurologic: Alert & oriented x 3, no focal  neuro deficits Psychiatric: Speech and behavior appropriate   ED Course   Medications  ondansetron (ZOFRAN-ODT) disintegrating tablet 8 mg (has no administration in time range)  cefTRIAXone (ROCEPHIN) injection 1 g (1 g Intramuscular Given 06/14/21 1219)    Orders Placed This Encounter  Procedures   DG Forearm Right    To include wrist.    Standing Status:   Standing    Number of Occurrences:   1    Order Specific Question:   Reason for Exam (SYMPTOM  OR DIAGNOSIS REQUIRED)    Answer:   Dog Bite, Redness, Swelling, Pain.    Order Specific Question:   Release  to patient    Answer:   Immediate   Wound care    Irrigation (wounds)    Standing Status:   Standing    Number of Occurrences:   1   Apply dressing    Standing Status:   Standing    Number of Occurrences:   1    No results found for this or any previous visit (from the past 24 hour(s)). DG Forearm Right  Result Date: 06/14/2021 CLINICAL DATA:  Dog bite with redness and swelling. EXAM: RIGHT FOREARM - 2 VIEW COMPARISON:  None. FINDINGS: Extensive soft tissue emphysema in the right forearm. No acute fracture or opaque foreign body. No subluxation. IMPRESSION: Extensive soft tissue emphysema tracking in the forearm. No opaque foreign body or fracture. Electronically Signed   By: Jorje Guild M.D.   On: 06/14/2021 12:49    ED Clinical Impression  1. Dog bite, initial encounter      ED Assessment/Plan  Wounds were cleaned, irrigated here and dressed.  Tetanus is already up-to-date. Patient was given 1000 mg of Rocephin IM because of the erythema streaking up her arm and 8 mg of Zofran ODT because of nausea.  Unable to give her any pain medication here as she took pain medication for hours prior to arrival.  Reviewed imaging independently.  Extensive soft tissue emphysema tracking in the forearm.  No retained foreign body.  see radiology report for full details.  Patient does not have any subcutaneous crepitus,  however, she has extensive amounts of subcutaneous air on x-ray.  There is no retained foreign body.  She has pain with passive extension of her fingers, flexion of the fingers, although she is able to do it, and pain with all range of motion of her wrist.  She has a large area of erythema extending distally and a significant amount of swelling.  Her RP is intact here.  Concern for tendon or deep muscle injury, even possibly early compartment syndrome.  Sending patient to the emergency department.  Patient wishes to go to [facility].  She is stable to go by private vehicle.  Discussed imaging, rationale for transfer to the emergency department with the patient.  She agrees to go.  Meds ordered this encounter  Medications   cefTRIAXone (ROCEPHIN) injection 1 g   ondansetron (ZOFRAN-ODT) disintegrating tablet 8 mg      *This clinic note was created using [employer]. Therefore, there may be occasional mistakes despite careful proofreading.  ?    Melynda Ripple, MD 06/14/21 1311

## 2021-06-14 NOTE — ED Triage Notes (Signed)
Patient is here for "Dog Bite" from son's dog "he is [place] with [place]". DOI: [id number]. Time: "Early". "Poncho recently had Surgery, Has been kind of out of it, On a leash, came up to me, went to rub him, then started biting arm, right, forearm". Reported by [facility] depart/employee ?Marland Kitchen  ?

## 2021-06-14 NOTE — ED Triage Notes (Signed)
Last Tdap: ? 2022. No record in [facility]. ?

## 2021-06-14 NOTE — ED Notes (Signed)
Patient is being discharged from the Urgent Care and sent to the [facility] Emergency Department via private vehicle with family member . Per Dr. Alphonzo Cruise, patient is in need of higher level of care due to needing frurher treatment of dog bite. Patient is aware and verbalizes understanding of plan of care.  ?Vitals:  ? 06/14/21 1143  ?BP: 132/75  ?Pulse: 84  ?Resp: 18  ?Temp: 98.4 ?F (36.9 ?C)  ?SpO2: 99%  ?  ?

## 2021-06-14 NOTE — Discharge Instructions (Addendum)
Please go immediately to the closest emergency department.  You are going to need more pain medication than what I can provide here, you may need IV antibiotics, advanced imaging and possibly a consult.  I am concerned that you have a tendon or deep muscle injury.  We have given you 1000 mg of Rocephin, Zofran here. ?

## 2021-06-15 DIAGNOSIS — W540XXA Bitten by dog, initial encounter: Secondary | ICD-10-CM | POA: Insufficient documentation

## 2021-06-15 DIAGNOSIS — S41151A Open bite of right upper arm, initial encounter: Secondary | ICD-10-CM | POA: Insufficient documentation

## 2021-06-19 DIAGNOSIS — R748 Abnormal levels of other serum enzymes: Secondary | ICD-10-CM | POA: Insufficient documentation

## 2021-07-09 ENCOUNTER — Encounter: Payer: Self-pay | Admitting: Family Medicine

## 2021-07-09 ENCOUNTER — Ambulatory Visit: Payer: BC Managed Care – PPO | Admitting: Family Medicine

## 2021-07-09 VITALS — BP 152/89 | HR 88 | Temp 98.2°F | Wt 143.6 lb

## 2021-07-09 DIAGNOSIS — I10 Essential (primary) hypertension: Secondary | ICD-10-CM | POA: Diagnosis not present

## 2021-07-09 MED ORDER — LISINOPRIL 10 MG PO TABS
15.0000 mg | ORAL_TABLET | Freq: Every day | ORAL | 3 refills | Status: DC
Start: 2021-07-09 — End: 2021-07-29

## 2021-07-09 NOTE — Progress Notes (Signed)
? ?BP (!) 152/89   Pulse 88   Temp 98.2 ?F (36.8 ?C)   Wt 143 lb 9.6 oz (65.1 kg)   SpO2 99%   BMI 23.90 kg/m?   ? ?Subjective:  ? ? Patient ID: Morgan Cisneros, female    DOB: [birth year], 56 y.o.   MRN: [id number] ? ?HPI: ?Morgan Cisneros is a 56 y.o. female ? ?Chief Complaint  ?Patient presents with  ? Hypertension  ? ?HYPERTENSION ?Hypertension status: stable  ?Satisfied with current treatment? no ?Duration of hypertension: chronic ?BP monitoring frequency:   while she was in the hospital ?BP medication side effects:  no ?Medication compliance: excellent compliance ?Previous BP meds:metoprolol, lisinopril ?Aspirin: no ?Recurrent headaches: no ?Visual changes: no ?Palpitations: no ?Dyspnea: no ?Chest pain: no ?Lower extremity edema: no ?Dizzy/lightheaded: no ? ?Relevant past medical, surgical, family and social history reviewed and updated as indicated. Interim medical history since our last visit reviewed. ?Allergies and medications reviewed and updated. ? ?Review of Systems  ?Constitutional: Negative.   ?Respiratory: Negative.    ?Cardiovascular: Negative.   ?Gastrointestinal: Negative.   ?Musculoskeletal: Negative.   ?Neurological: Negative.   ?Psychiatric/Behavioral: Negative.    ? ?Per HPI unless specifically indicated above ? ?   ?Objective:  ?  ?BP (!) 152/89   Pulse 88   Temp 98.2 ?F (36.8 ?C)   Wt 143 lb 9.6 oz (65.1 kg)   SpO2 99%   BMI 23.90 kg/m?   ?Wt Readings from Last 3 Encounters:  ?07/09/21 143 lb 9.6 oz (65.1 kg)  ?06/14/21 149 lb (67.6 kg)  ?06/12/21 149 lb 3.2 oz (67.7 kg)  ?  ?Physical Exam ?Vitals and nursing note reviewed.  ?Constitutional:   ?   General: She is not in acute distress. ?   Appearance: Normal appearance. She is not ill-appearing, toxic-appearing or diaphoretic.  ?HENT:  ?   Head: Normocephalic and atraumatic.  ?   Right Ear: External ear normal.  ?   Left Ear: External ear normal.  ?   Nose: Nose normal.  ?   Mouth/Throat:  ?   Mouth: Mucous membranes are moist.   ?   Pharynx: Oropharynx is clear.  ?Eyes:  ?   General: No scleral icterus.    ?   Right eye: No discharge.     ?   Left eye: No discharge.  ?   Extraocular Movements: Extraocular movements intact.  ?   Conjunctiva/sclera: Conjunctivae normal.  ?   Pupils: Pupils are equal, round, and reactive to light.  ?Cardiovascular:  ?   Rate and Rhythm: Normal rate and regular rhythm.  ?   Pulses: Normal pulses.  ?   Heart sounds: Normal heart sounds. No murmur heard. ?  No friction rub. No gallop.  ?Pulmonary:  ?   Effort: Pulmonary effort is normal. No respiratory distress.  ?   Breath sounds: Normal breath sounds. No stridor. No wheezing, rhonchi or rales.  ?Chest:  ?   Chest wall: No tenderness.  ?Musculoskeletal:     ?   General: Normal range of motion.  ?   Cervical back: Normal range of motion and neck supple.  ?Skin: ?   General: Skin is warm and dry.  ?   Capillary Refill: Capillary refill takes less than 2 seconds.  ?   Coloration: Skin is not jaundiced or pale.  ?   Findings: No bruising, erythema, lesion or rash.  ?Neurological:  ?   General: No focal deficit present.  ?  Mental Status: She is alert and oriented to person, place, and time. Mental status is at baseline.  ?Psychiatric:     ?   Mood and Affect: Mood normal.     ?   Behavior: Behavior normal.     ?   Thought Content: Thought content normal.     ?   Judgment: Judgment normal.  ? ? ?Results for orders placed or performed in visit on 06/12/21  ?Basic metabolic panel  ?Result Value Ref Range  ? Glucose 88 70 - 99 mg/dL  ? BUN 14 6 - 24 mg/dL  ? Creatinine, Ser 0.75 0.57 - 1.00 mg/dL  ? eGFR 95 >59 mL/min/1.73  ? BUN/Creatinine Ratio 19 9 - 23  ? Sodium 146 (H) 134 - 144 mmol/L  ? Potassium 4.0 3.5 - 5.2 mmol/L  ? Chloride 107 (H) 96 - 106 mmol/L  ? CO2 22 20 - 29 mmol/L  ? Calcium 9.4 8.7 - 10.2 mg/dL  ? ?   ?Assessment & Plan:  ? ?Problem List Items Addressed This Visit   ? ?  ? Cardiovascular and Mediastinum  ? Essential hypertension - Primary  ?   Still running high. Will increase her lisinopril to 32m and recheck 1 month. Call with any concerns.  ?  ?  ? Relevant Medications  ? lisinopril (ZESTRIL) 10 MG tablet  ?  ? ?Follow up plan: ?Return in about 4 weeks (around 08/06/2021). ? ? ? ? ? ?

## 2021-07-09 NOTE — Assessment & Plan Note (Signed)
Still running high. Will increase her lisinopril to '15mg'$  and recheck 1 month. Call with any concerns.  ?

## 2021-07-10 ENCOUNTER — Ambulatory Visit: Payer: BC Managed Care – PPO | Admitting: Family Medicine

## 2021-07-23 ENCOUNTER — Encounter: Payer: Self-pay | Admitting: Family Medicine

## 2021-07-27 ENCOUNTER — Encounter: Payer: Self-pay | Admitting: Family Medicine

## 2021-07-27 NOTE — Telephone Encounter (Signed)
Appt please

## 2021-07-27 NOTE — Telephone Encounter (Signed)
Patient scheduled this wednesday ?

## 2021-07-29 ENCOUNTER — Encounter: Payer: Self-pay | Admitting: Family Medicine

## 2021-07-29 ENCOUNTER — Ambulatory Visit (INDEPENDENT_AMBULATORY_CARE_PROVIDER_SITE_OTHER): Payer: BC Managed Care – PPO | Admitting: Family Medicine

## 2021-07-29 VITALS — BP 129/82 | HR 76 | Temp 98.4°F | Wt 144.2 lb

## 2021-07-29 DIAGNOSIS — I10 Essential (primary) hypertension: Secondary | ICD-10-CM | POA: Diagnosis not present

## 2021-07-29 MED ORDER — LISINOPRIL 10 MG PO TABS: 15.0000 mg | ORAL_TABLET | Freq: Every day | ORAL | 1 refills | Status: DC

## 2021-07-29 NOTE — Assessment & Plan Note (Signed)
Under good control on current regimen. Continue current regimen. Continue to monitor. Call with any concerns. Refills given. Labs drawn today. Will get her new BP cuff and get her in for comparison of the 2 for nurse visit.   ? ?

## 2021-07-29 NOTE — Progress Notes (Signed)
? ?BP 129/82   Pulse 76   Temp 98.4 ?F (36.9 ?C)   Wt 144 lb 3.2 oz (65.4 kg)   SpO2 98%   BMI 24.00 kg/m?   ? ?Subjective:  ? ? Patient ID: Morgan Cisneros, female    DOB: July 05, 1965, 56 y.o.   MRN: [id number] ? ?HPI: ?Morgan Cisneros is a 56 y.o. female ? ?Chief Complaint  ?Patient presents with  ? Hypertension  ?  Patient states her BP has been running high. Patient states she did increase to lisinopril 15 mg and its still running high.   ? ?HYPERTENSION ?Hypertension status: better  ?Satisfied with current treatment? yes ?Duration of hypertension: chronic ?BP monitoring frequency:  a few times a week ?BP range: high- but just got new batteries in her cuff and that's when it started running high ?BP medication side effects:  no ?Medication compliance: excellent compliance ?Previous BP meds:lisinopril, metoprolol ?Aspirin: no ?Recurrent headaches: no ?Visual changes: no ?Palpitations: no ?Dyspnea: no ?Chest pain: no ?Lower extremity edema: no ?Dizzy/lightheaded: no ? ? ?Relevant past medical, surgical, family and social history reviewed and updated as indicated. Interim medical history since our last visit reviewed. ?Allergies and medications reviewed and updated. ? ?Review of Systems  ?Constitutional: Negative.   ?Respiratory: Negative.    ?Cardiovascular: Negative.   ?Gastrointestinal: Negative.   ?Musculoskeletal: Negative.   ?Psychiatric/Behavioral: Negative.    ? ?Per HPI unless specifically indicated above ? ?   ?Objective:  ?  ?BP 129/82   Pulse 76   Temp 98.4 ?F (36.9 ?C)   Wt 144 lb 3.2 oz (65.4 kg)   SpO2 98%   BMI 24.00 kg/m?   ?Wt Readings from Last 3 Encounters:  ?07/29/21 144 lb 3.2 oz (65.4 kg)  ?07/09/21 143 lb 9.6 oz (65.1 kg)  ?06/14/21 149 lb (67.6 kg)  ?  ?Physical Exam ?Vitals and nursing note reviewed.  ?Constitutional:   ?   General: She is not in acute distress. ?   Appearance: Normal appearance. She is not ill-appearing, toxic-appearing or diaphoretic.  ?HENT:  ?   Head:  Normocephalic and atraumatic.  ?   Right Ear: External ear normal.  ?   Left Ear: External ear normal.  ?   Nose: Nose normal.  ?   Mouth/Throat:  ?   Mouth: Mucous membranes are moist.  ?   Pharynx: Oropharynx is clear.  ?Eyes:  ?   General: No scleral icterus.    ?   Right eye: No discharge.     ?   Left eye: No discharge.  ?   Extraocular Movements: Extraocular movements intact.  ?   Conjunctiva/sclera: Conjunctivae normal.  ?   Pupils: Pupils are equal, round, and reactive to light.  ?Cardiovascular:  ?   Rate and Rhythm: Normal rate and regular rhythm.  ?   Pulses: Normal pulses.  ?   Heart sounds: Normal heart sounds. No murmur heard. ?  No friction rub. No gallop.  ?Pulmonary:  ?   Effort: Pulmonary effort is normal. No respiratory distress.  ?   Breath sounds: Normal breath sounds. No stridor. No wheezing, rhonchi or rales.  ?Chest:  ?   Chest wall: No tenderness.  ?Musculoskeletal:     ?   General: Normal range of motion.  ?   Cervical back: Normal range of motion and neck supple.  ?Skin: ?   General: Skin is warm and dry.  ?   Capillary Refill: Capillary refill takes less than  2 seconds.  ?   Coloration: Skin is not jaundiced or pale.  ?   Findings: No bruising, erythema, lesion or rash.  ?Neurological:  ?   General: No focal deficit present.  ?   Mental Status: She is alert and oriented to person, place, and time. Mental status is at baseline.  ?Psychiatric:     ?   Mood and Affect: Mood normal.     ?   Behavior: Behavior normal.     ?   Thought Content: Thought content normal.     ?   Judgment: Judgment normal.  ? ? ?Results for orders placed or performed in visit on 06/12/21  ?Basic metabolic panel  ?Result Value Ref Range  ? Glucose 88 70 - 99 mg/dL  ? BUN 14 6 - 24 mg/dL  ? Creatinine, Ser 0.75 0.57 - 1.00 mg/dL  ? eGFR 95 >59 mL/min/1.73  ? BUN/Creatinine Ratio 19 9 - 23  ? Sodium 146 (H) 134 - 144 mmol/L  ? Potassium 4.0 3.5 - 5.2 mmol/L  ? Chloride 107 (H) 96 - 106 mmol/L  ? CO2 22 20 - 29 mmol/L   ? Calcium 9.4 8.7 - 10.2 mg/dL  ? ?   ?Assessment & Plan:  ? ?Problem List Items Addressed This Visit   ? ?  ? Cardiovascular and Mediastinum  ? Essential hypertension - Primary  ?  Under good control on current regimen. Continue current regimen. Continue to monitor. Call with any concerns. Refills given. Labs drawn today. Will get her new BP cuff and get her in for comparison of the 2 for nurse visit.   ? ? ?  ?  ? Relevant Medications  ? lisinopril (ZESTRIL) 10 MG tablet  ? Other Relevant Orders  ? Basic metabolic panel  ?  ? ?Follow up plan: ?Return in about 2 months (around 09/28/2021). ? ? ? ? ? ?

## 2021-07-30 LAB — BASIC METABOLIC PANEL
BUN/Creatinine Ratio: 21 (ref 9–23)
BUN: 14 mg/dL (ref 6–24)
CO2: 20 mmol/L (ref 20–29)
Calcium: 9.3 mg/dL (ref 8.7–10.2)
Chloride: 106 mmol/L (ref 96–106)
Creatinine, Ser: 0.68 mg/dL (ref 0.57–1.00)
Glucose: 109 mg/dL — ABNORMAL HIGH (ref 70–99)
Potassium: 3.9 mmol/L (ref 3.5–5.2)
Sodium: 145 mmol/L — ABNORMAL HIGH (ref 134–144)
eGFR: 103 mL/min/{1.73_m2} (ref >59)

## 2021-08-07 ENCOUNTER — Ambulatory Visit: Payer: BC Managed Care – PPO | Admitting: Family Medicine

## 2021-08-14 ENCOUNTER — Ambulatory Visit: Payer: BC Managed Care – PPO | Admitting: Family Medicine

## 2021-08-25 ENCOUNTER — Encounter: Payer: Self-pay | Admitting: Family Medicine

## 2021-08-27 ENCOUNTER — Other Ambulatory Visit: Payer: Self-pay | Admitting: Family Medicine

## 2021-08-27 NOTE — Telephone Encounter (Signed)
Medication Refill - Medication: LORazepam (ATIVAN) 0.5 MG tablet  Has the patient contacted their pharmacy? No. Pt sent mychart message  Preferred Pharmacy (with phone number or street name): [facility] [place], [place] Has the patient been seen for an appointment in the last year OR does the patient have an upcoming appointment? Yes.    Agent: Please be advised that RX refills may take up to 3 business days. We ask that you follow-up with your pharmacy.

## 2021-08-28 MED ORDER — LORAZEPAM 0.5 MG PO TABS: 0.5000 mg | ORAL_TABLET | Freq: Two times a day (BID) | ORAL | 0 refills | Status: DC | PRN

## 2021-09-04 ENCOUNTER — Ambulatory Visit: Payer: Self-pay | Admitting: *Deleted

## 2021-09-04 NOTE — Telephone Encounter (Signed)
Reason for Disposition  [1] SEVERE diarrhea (e.g., 7 or more times / day more than normal) AND [2] present > 24 hours (1 day)  Answer Assessment - Initial Assessment Questions 1. DIARRHEA SEVERITY: "How bad is the diarrhea?" "How many more stools have you had in the past 24 hours than normal?"    - NO DIARRHEA (SCALE 0)   - MILD (SCALE 1-3): Few loose or mushy BMs; increase of 1-3 stools over normal daily number of stools; mild increase in ostomy output.   -  MODERATE (SCALE 4-7): Increase of 4-6 stools daily over normal; moderate increase in ostomy output. * SEVERE (SCALE 8-10; OR 'WORST POSSIBLE'): Increase of 7 or more stools daily over normal; moderate increase in ostomy output; incontinence.     Having diarrhea for 2 weeks.   I have diverticulitis.   I had a foot of my intestine removed.   I feel like it's coming back.  2. ONSET: "When did the diarrhea begin?"      Every time I eat I go have diarrhea.   Started 2 weeks ago. 3. BM CONSISTENCY: "How loose or watery is the diarrhea?"      Very watery.    4. VOMITING: "Are you also vomiting?" If Yes, ask: "How many times in the past 24 hours?"      No  I have nausea 5. ABDOMINAL PAIN: "Are you having any abdominal pain?" If Yes, ask: "What does it feel like?" (e.g., crampy, dull, intermittent, constant)      Yesterday I had fever.   I took Tylenol and it went away. 6. ABDOMINAL PAIN SEVERITY: If present, ask: "How bad is the pain?"  (e.g., Scale 1-10; mild, moderate, or severe)   - MILD (1-3): doesn't interfere with normal activities, abdomen soft and not tender to touch    - MODERATE (4-7): interferes with normal activities or awakens from sleep, abdomen tender to touch    - SEVERE (8-10): excruciating pain, doubled over, unable to do any normal activities       Yes abd pain right lower side.   No radiation 7. ORAL INTAKE: If vomiting, "Have you been able to drink liquids?" "How much liquids have you had in the past 24 hours?"     Drinking  water but it's going straight through me.    My mouth is very dry.   Imodium is not helping. 8. HYDRATION: "Any signs of dehydration?" (e.g., dry mouth [not just dry lips], too weak to stand, dizziness, new weight loss) "When did you last urinate?"     Very dry mouth, water going straight through me. I had C-diff when in the hospital for diverticulitis.   This feels like that.    This was 4 years ago.   My husband passed away from Covid and this has been getting worse form the stress.   I can't get the diarrhea under control and the abd pain is worse. I'm taking Tylenol for the abd cramping because the Imodium is not helping. 9. EXPOSURE: "Have you traveled to a foreign country recently?" "Have you been exposed to anyone with diarrhea?" "Could you have eaten any food that was spoiled?"     No 10. ANTIBIOTIC USE: "Are you taking antibiotics now or have you taken antibiotics in the past 2 months?"       I've been on antibiotics for a dog bite.   The diarrhea has really gotten better since then.   This happened in March ago in  hospital on IV antibiotics. 11. OTHER SYMPTOMS: "Do you have any other symptoms?" (e.g., fever, blood in stool)       No 12. PREGNANCY: "Is there any chance you are pregnant?" "When was your last menstrual period?"       Not asked  Protocols used: [facility]

## 2021-09-04 NOTE — Telephone Encounter (Signed)
  Chief Complaint: Diarrhea and abd pain for 2 wks.   Hx of C-Diff and diverticulitis.  Everything going straight through her. Symptoms: right lower abd pain and diarrhea that is getting worse Frequency: daily watery diarrhea Pertinent Negatives: Patient denies dizziness. Disposition: '[]'$ ED /'[]'$ Urgent Care (no appt availability in office) / '[x]'$ Appointment(In office/virtual)/ '[]'$  [facility]/ '[]'$ Home Care/ '[]'$ Refused Recommended Disposition /'[]'$ [facility]/ '[]'$  Follow-up with PCP Additional Notes: Appt made for 09/07/2021 at 8:20 with [place] with directions to go to ED over weekend for signs/symptoms of dehydration that I went over with her.

## 2021-09-07 ENCOUNTER — Ambulatory Visit: Payer: BC Managed Care – PPO | Admitting: Physician Assistant

## 2021-09-07 ENCOUNTER — Encounter: Payer: Self-pay | Admitting: Physician Assistant

## 2021-09-07 VITALS — BP 132/89 | HR 73 | Temp 97.9°F | Ht 65.0 in | Wt 139.4 lb

## 2021-09-07 DIAGNOSIS — R110 Nausea: Secondary | ICD-10-CM

## 2021-09-07 DIAGNOSIS — Z8719 Personal history of other diseases of the digestive system: Secondary | ICD-10-CM

## 2021-09-07 DIAGNOSIS — R197 Diarrhea, unspecified: Secondary | ICD-10-CM | POA: Diagnosis not present

## 2021-09-07 MED ORDER — ONDANSETRON HCL 4 MG PO TABS: 4.0000 mg | ORAL_TABLET | Freq: Three times a day (TID) | ORAL | 0 refills | Status: DC | PRN

## 2021-09-07 NOTE — Progress Notes (Signed)
Established Patient Office Visit  Name: Morgan Cisneros   MRN: [id number]    DOB: 03/26/1966   Date:09/07/2021  Today's Provider: Talitha Givens, MHS, PA-C Introduced myself to the patient as a PA-C and provided education on APPs in clinical practice.         Subjective  Chief Complaint  Chief Complaint  Patient presents with   Diarrhea    For the past 2 weeks, h/o diverticulosis and C Diff.    Abdominal Pain   Nausea    Diarrhea  Associated symptoms include abdominal pain.  Abdominal Pain Associated symptoms include diarrhea.    States about 5 years ago she had diverticulitis and C.dif infection Thinks this may be a stress/ anxiety reaction She was recently in the hospital for a dog bite and had multiple rounds of abx  States she is having bright yellow, watery diarrhea- describes it as having a bad odor Reports pain after eating, and pain with bowel movement - then feels better after having bowel movement Reports pain about 30 minutes after eating  She is taking Imodium to assist with some mild relief  States she had to take 6 Imodium on Friday, did not have bowel movement on Sat but then things resumed on Sun- had to take 3 Imodium Sun    Patient Active Problem List   Diagnosis Date Noted   Palpitations 02/12/2021   Dupuytren's contracture 01/04/2019   Essential hypertension 06/29/2018   Depression, recurrent ([place]) 06/29/2018   Benign neoplasm of ascending colon    Anxiety 04/18/2017   Spasm of back muscles 10/28/2016   Diverticulitis 10/19/2016   GERD (gastroesophageal reflux disease) 04/18/2015   Menopausal symptoms 04/18/2015   Insomnia    Family history of colon cancer in mother 01/15/2015   Family history of ovarian cancer 01/15/2015   Tobacco abuse 01/15/2015   Lymphadenopathy, axillary 01/15/2015   Calculus of kidney 12/13/2014   Female genuine stress incontinence 12/13/2014    Past Surgical History:  Procedure Laterality Date   BREAST  BIOPSY Left 08/10/2016   [place] core, PASH   BREAST BIOPSY Right 01/17/2020   no clip due to pt condition during biopsy-neg   BREAST BIOPSY Right 02/07/2020   re-biopsy due to prior biopsy/ coil clip/ path pending   COLONOSCOPY WITH PROPOFOL N/A 04/25/2017   Procedure: COLONOSCOPY WITH PROPOFOL;  Surgeon: Lucilla Lame, MD;  Location: [place];  Service: Endoscopy;  Laterality: N/A;   CYSTOSCOPY  11/26/2010   with stent placement   ESOPHAGOGASTRODUODENOSCOPY (EGD) WITH PROPOFOL N/A 04/25/2017   Procedure: ESOPHAGOGASTRODUODENOSCOPY (EGD) WITH PROPOFOL;  Surgeon: Lucilla Lame, MD;  Location: [facility];  Service: Endoscopy;  Laterality: N/A;   HEMORRHOID SURGERY     kidney stent     LAPAROSCOPIC APPENDECTOMY N/A 02/02/2017   Procedure: APPENDECTOMY LAPAROSCOPIC;  Surgeon: Clayburn Pert, MD;  Location: [facility];  Service: General;  Laterality: N/A;   LAPAROSCOPIC SIGMOID COLECTOMY N/A 11/01/2016   Procedure: LAPAROSCOPIC SIGMOID COLECTOMY;  Surgeon: Clayburn Pert, MD;  Location: [facility];  Service: General;  Laterality: N/A;   LITHOTRIPSY     X 4   POLYPECTOMY  04/25/2017   Procedure: POLYPECTOMY INTESTINAL;  Surgeon: Lucilla Lame, MD;  Location: [place];  Service: Endoscopy;;   TONSILLECTOMY     TOTAL ABDOMINAL HYSTERECTOMY  08/2013   TOTAL ABDOMINAL HYSTERECTOMY W/ BILATERAL SALPINGOOPHORECTOMY     TUBAL LIGATION     Ureteroscopy Right 11/21/1997  with holmium laser    Family History  Problem Relation Age of Onset   Kidney Stones Mother    Ovarian cancer Mother    Colon cancer Mother    Lung cancer Father    Hyperlipidemia Sister    Hypertension Sister    Hypertension Sister    Hyperlipidemia Sister    Alzheimer's disease Maternal Grandmother    Cancer Maternal Grandfather        Liver   Alzheimer's disease Paternal Grandfather    Breast cancer Neg Hx     Social History   Tobacco Use   Smoking status: Every Day    Packs/day: 0.50     Types: Cigarettes   Smokeless tobacco: Never  Substance Use Topics   Alcohol use: Yes    Alcohol/week: 0.0 standard drinks    Comment: occasional     Current Outpatient Medications:    DULoxetine (CYMBALTA) 60 MG capsule, TAKE 1 CAPSULE(60 MG) BY MOUTH DAILY, Disp: 90 capsule, Rfl: 1   lisinopril (ZESTRIL) 10 MG tablet, Take 1.5 tablets (15 mg total) by mouth daily., Disp: 135 tablet, Rfl: 1   LORazepam (ATIVAN) 0.5 MG tablet, Take 1 tablet (0.5 mg total) by mouth 2 (two) times daily as needed., Disp: 45 tablet, Rfl: 0   metoprolol succinate (TOPROL-XL) 100 MG 24 hr tablet, TAKE 1 TABLET(100 MG) BY MOUTH DAILY, Disp: 90 tablet, Rfl: 1  Allergies  Allergen Reactions   Wellbutrin [Bupropion] Other (See Comments)    irritiability   Urocit-K [Potassium Citrate] Nausea Only    I personally reviewed active problem list, medication list, allergies with the patient/caregiver today.   Review of Systems  Gastrointestinal:  Positive for abdominal pain and diarrhea.     Objective  Vitals:   09/07/21 0823  BP: 132/89  Pulse: 73  Temp: 97.9 F (36.6 C)  TempSrc: Oral  SpO2: 98%  Weight: 139 lb 6.4 oz (63.2 kg)  Height: _0  (1.651 m)    Body mass index is 23.2 kg/m.  Physical Exam Vitals reviewed.  Constitutional:      General: She is awake.     Appearance: Normal appearance. She is well-developed, well-groomed and normal weight.  HENT:     Head: Normocephalic and atraumatic.  Cardiovascular:     Rate and Rhythm: Normal rate and regular rhythm.     Pulses:          Radial pulses are 2+ on the right side and 2+ on the left side.       Dorsalis pedis pulses are 2+ on the right side and 2+ on the left side.       Posterior tibial pulses are 2+ on the right side and 2+ on the left side.     Heart sounds: Normal heart sounds. No murmur heard.   No gallop.  Pulmonary:     Effort: Pulmonary effort is normal. No respiratory distress.     Breath sounds: Normal breath  sounds. No stridor. No wheezing, rhonchi or rales.  Abdominal:     General: Abdomen is flat. Bowel sounds are increased. There is no distension.     Palpations: Abdomen is soft.     Tenderness: There is abdominal tenderness in the left lower quadrant.     Comments: Mild LLQ tenderness with palpation. Able to tolerate light and deep palpation without severe pain Normal tympany in all quadrants  Musculoskeletal:     Right lower leg: No edema.     Left lower leg: No  edema.  Skin:    General: Skin is cool and moist.     Capillary Refill: Capillary refill takes less than 2 seconds.  Neurological:     General: No focal deficit present.     Mental Status: She is alert and oriented to person, place, and time.     GCS: GCS eye subscore is 4. GCS verbal subscore is 5. GCS motor subscore is 6.  Psychiatric:        Attention and Perception: Attention and perception normal.        Mood and Affect: Mood and affect normal.        Speech: Speech normal.        Behavior: Behavior normal. Behavior is cooperative.     Recent Results (from the past 2160 hour(s))  Basic metabolic panel     Status: Abnormal   Collection Time: 06/12/21  4:34 PM  Result Value Ref Range   Glucose 88 70 - 99 mg/dL   BUN 14 6 - 24 mg/dL   Creatinine, Ser 0.75 0.57 - 1.00 mg/dL   eGFR 95 >59 mL/min/1.73   BUN/Creatinine Ratio 19 9 - 23   Sodium 146 (H) 134 - 144 mmol/L   Potassium 4.0 3.5 - 5.2 mmol/L   Chloride 107 (H) 96 - 106 mmol/L   CO2 22 20 - 29 mmol/L   Calcium 9.4 8.7 - 10.2 mg/dL  Basic metabolic panel     Status: Abnormal   Collection Time: 07/29/21  9:33 AM  Result Value Ref Range   Glucose 109 (H) 70 - 99 mg/dL   BUN 14 6 - 24 mg/dL   Creatinine, Ser 0.68 0.57 - 1.00 mg/dL   eGFR 103 >59 mL/min/1.73   BUN/Creatinine Ratio 21 9 - 23   Sodium 145 (H) 134 - 144 mmol/L   Potassium 3.9 3.5 - 5.2 mmol/L   Chloride 106 96 - 106 mmol/L   CO2 20 20 - 29 mmol/L   Calcium 9.3 8.7 - 10.2 mg/dL      PHQ2/9:    07/29/2021    9:15 AM 07/09/2021    3:18 PM 06/12/2021    4:04 PM 03/13/2021   10:27 AM 02/12/2021    9:31 AM  Depression screen PHQ 2/9  Decreased Interest _0 Down, Depressed, Hopeless _1 PHQ - 2 Score _2 Altered sleeping _3 Tired, decreased energy _4 Change in appetite 2 0 1 2 0  Feeling bad or failure about yourself  _5 Trouble concentrating _6 Moving slowly or fidgety/restless 0 0 0 0 0  Suicidal thoughts 0 0 0 0 0  PHQ-9 Score _7 Difficult doing work/chores     Not difficult at all      Fall Risk:    10/30/2020    3:05 PM 07/10/2020    3:48 PM 11/23/2018    4:22 PM 08/14/2018    1:49 PM 08/11/2017    3:04 PM  Fall Risk   Falls in the past year? 0 0 0 0 No  Number falls in past yr: 0  0 0   Injury with Fall? 0  0    Risk for fall due to : No Fall Risks      Follow up Falls evaluation completed   Falls evaluation completed  Functional Status Survey:      Assessment & Plan  Problem List Items Addressed This Visit   None Visit Diagnoses     Diarrhea, unspecified type    -  Primary Acute, new problem Patient reports ongoing diarrhea for the past two weeks with LLQ pain  She has a history of C.dif infection and acute diverticulitis with colectomy in 2018 Will run labs to rule out active c. Dif infection and CRP, CMP, CBC to assist with dx further Suspect acute diverticulitis at this time given LLQ tenderness, nausea, and discomfort relieved with bowel movements Reviewed staying well hydrated and maintaining electrolytes, eating BRAT diet and ED precautions Results of labs to dictate further management- may need to add ABX for acute diverticulitis  Follow up as needed.    Relevant Orders   Stool C-Diff Toxin Assay   CBC w/Diff   Comp Met (CMET)   C-reactive protein   Hx of diverticulitis of colon     Remote, potentially contributory  Will check CBC, CMP, and CRP to  assist with rule out Results to dictate further management Follow up as needed.    Relevant Orders   CBC w/Diff   Comp Met (CMET)   C-reactive protein   Nausea     Acute, new problem  Likely associated with diarrhea and GI distress  Will provide Zofran to assist so she can maintain fluid intake    Relevant Medications   ondansetron (ZOFRAN) 4 MG tablet        No follow-ups on file.   I, Franchot Pollitt E Nicola Quesnell, PA-C, have reviewed all documentation for this visit. The documentation on 09/07/21 for the exam, diagnosis, procedures, and orders are all accurate and complete.   Talitha Givens, MHS, PA-C [facility]

## 2021-09-07 NOTE — Patient Instructions (Signed)
Today we discussed your concerns for ongoing diarrhea Diarrhea is usually self-limited and can have many causes The central tenants of management include the following: Staying well hydrated - this includes increasing water intake and supplementing with Pedialyte, Gatorade, etc. Try to eat a bland diet- this includes boiled chicken, brown rice, bananas, applesauce, plain toast, etc. As you are feeling better you can usually begin to slowly reincorporate your normal dietary choices back into your dietary consumption Using Imodium and Pepto Bismol as needed and according to manufacturers instructions to provide assistance with managing symptoms.   Diarrhea with the following will need to be monitored and may require further testing: bloody diarrhea, fever, weight loss, signs of dehydration, weakness, fatigue, incontinence, diarrhea lasting longer than 1-3 weeks, intense abdominal pain  We will keep you updated on the results of your lab work and any further action that may be needed as those results become available.

## 2021-09-08 LAB — CBC WITH DIFFERENTIAL/PLATELET
Basophils Absolute: 0 10*3/uL (ref 0.0–0.2)
Basos: 1 %
EOS (ABSOLUTE): 0.2 10*3/uL (ref 0.0–0.4)
Eos: 4 %
Hematocrit: 40 % (ref 34.0–46.6)
Hemoglobin: 13.5 g/dL (ref 11.1–15.9)
Immature Grans (Abs): 0 10*3/uL (ref 0.0–0.1)
Immature Granulocytes: 0 %
Lymphocytes Absolute: 1.8 10*3/uL (ref 0.7–3.1)
Lymphs: 33 %
MCH: 32.2 pg (ref 26.6–33.0)
MCHC: 33.8 g/dL (ref 31.5–35.7)
MCV: 96 fL (ref 79–97)
Monocytes Absolute: 0.3 10*3/uL (ref 0.1–0.9)
Monocytes: 6 %
Neutrophils Absolute: 3.1 10*3/uL (ref 1.4–7.0)
Neutrophils: 56 %
Platelets: 299 10*3/uL (ref 150–450)
RBC: 4.19 x10E6/uL (ref 3.77–5.28)
RDW: 14.9 % (ref 11.7–15.4)
WBC: 5.5 10*3/uL (ref 3.4–10.8)

## 2021-09-08 LAB — COMPREHENSIVE METABOLIC PANEL
ALT: 31 IU/L (ref 0–32)
AST: 29 IU/L (ref 0–40)
Albumin/Globulin Ratio: 2.1 (ref 1.2–2.2)
Albumin: 4.6 g/dL (ref 3.8–4.9)
Alkaline Phosphatase: 141 IU/L — ABNORMAL HIGH (ref 44–121)
BUN/Creatinine Ratio: 15 (ref 9–23)
BUN: 9 mg/dL (ref 6–24)
Bilirubin Total: 0.3 mg/dL (ref 0.0–1.2)
CO2: 23 mmol/L (ref 20–29)
Calcium: 9.6 mg/dL (ref 8.7–10.2)
Chloride: 106 mmol/L (ref 96–106)
Creatinine, Ser: 0.62 mg/dL (ref 0.57–1.00)
Globulin, Total: 2.2 g/dL (ref 1.5–4.5)
Glucose: 107 mg/dL — ABNORMAL HIGH (ref 70–99)
Potassium: 3.7 mmol/L (ref 3.5–5.2)
Sodium: 143 mmol/L (ref 134–144)
Total Protein: 6.8 g/dL (ref 6.0–8.5)
eGFR: 106 mL/min/{1.73_m2} (ref >59)

## 2021-09-08 LAB — C-REACTIVE PROTEIN: CRP: 6 mg/L (ref 0–10)

## 2021-10-01 ENCOUNTER — Ambulatory Visit: Payer: BC Managed Care – PPO | Admitting: Family Medicine

## 2021-10-01 ENCOUNTER — Encounter: Payer: Self-pay | Admitting: Family Medicine

## 2021-10-01 VITALS — BP 138/90 | HR 83 | Temp 98.7°F | Wt 141.0 lb

## 2021-10-01 DIAGNOSIS — F419 Anxiety disorder, unspecified: Secondary | ICD-10-CM | POA: Diagnosis not present

## 2021-10-01 DIAGNOSIS — R197 Diarrhea, unspecified: Secondary | ICD-10-CM

## 2021-10-01 DIAGNOSIS — I10 Essential (primary) hypertension: Secondary | ICD-10-CM | POA: Diagnosis not present

## 2021-10-01 DIAGNOSIS — F339 Major depressive disorder, recurrent, unspecified: Secondary | ICD-10-CM

## 2021-10-01 MED ORDER — DULOXETINE HCL 60 MG PO CPEP
ORAL_CAPSULE | ORAL | 1 refills | Status: DC
Start: 2021-10-01 — End: 2022-01-14

## 2021-10-01 MED ORDER — LORAZEPAM 0.5 MG PO TABS: 0.5000 mg | ORAL_TABLET | Freq: Two times a day (BID) | ORAL | 0 refills | Status: DC | PRN

## 2021-10-01 MED ORDER — LORAZEPAM 0.5 MG PO TABS: 0.5000 mg | ORAL_TABLET | Freq: Two times a day (BID) | ORAL | 1 refills | Status: DC | PRN

## 2021-10-01 MED ORDER — SUCRALFATE 1 G PO TABS: 1.0000 g | ORAL_TABLET | Freq: Three times a day (TID) | ORAL | 3 refills | Status: DC

## 2021-10-01 NOTE — Progress Notes (Signed)
BP 138/90   Pulse 83   Temp 98.7 F (37.1 C) (Oral)   Wt 141 lb (64 kg)   SpO2 98%   BMI 23.46 kg/m    Subjective:    Patient ID: Morgan Cisneros, female    DOB: [birth year], 56 y.o.   MRN: [id number]  HPI: Morgan Cisneros is a 56 y.o. female  Chief Complaint  Patient presents with   Hypertension   Depression   Has been having some pretty intense diarrhea several times a day. No nausea, no vomiting. No fevers. No blood in stool. She does note that she has lost some weight with it. She was taking carafate which helped, but she ran out of it and it has been worse since that happened.   ANXIETY/DEPRESSION Duration: chronic Status:stable Anxious mood: yes  Excessive worrying: yes Irritability: no  Sweating: no Nausea: yes Palpitations:yes Hyperventilation: no Panic attacks: no Agoraphobia: no  Obscessions/compulsions: no Depressed mood: yes    10/01/2021    4:26 PM 09/07/2021    8:33 AM 07/29/2021    9:15 AM 07/09/2021    3:18 PM 06/12/2021    4:04 PM  Depression screen PHQ 2/9  Decreased Interest 2 2 2 2 2   Down, Depressed, Hopeless 2 3 2 3 2   PHQ - 2 Score 4 5 4 5 4   Altered sleeping 2 3 3 2 3   Tired, decreased energy 2 2 3 2 2   Change in appetite 2 3 2  0 1  Feeling bad or failure about yourself  2 2 1 2 2   Trouble concentrating 2 2 3 2 3   Moving slowly or fidgety/restless 0 0 0 0 0  Suicidal thoughts 0 0 0 0 0  PHQ-9 Score 14 17 16 13 15   Difficult doing work/chores Somewhat difficult          10/01/2021    4:26 PM 09/07/2021    8:34 AM 07/29/2021    9:16 AM 07/09/2021    3:18 PM  GAD 7 : Generalized Anxiety Score  Nervous, Anxious, on Edge 2 2 3 3   Control/stop worrying 2 2 2 2   Worry too much - different things 2 2 2 2   Trouble relaxing 2 3 3 3   Restless 2 3 2 1   Easily annoyed or irritable 0 0 1 0  Afraid - awful might happen 2 3 1 2   Total GAD 7 Score 12 15 14 13   Anxiety Difficulty Somewhat difficult  Somewhat difficult Not difficult at all    Anhedonia: no Weight changes: no Insomnia: no   Hypersomnia: no Fatigue/loss of energy: yes Feelings of worthlessness: yes Feelings of guilt: yes Impaired concentration/indecisiveness: no Suicidal ideations: no  Crying spells: no Recent Stressors/Life Changes: yes   Relationship problems: no   Family stress: yes     Financial stress: no    Job stress: no    Recent death/loss: yes   Relevant past medical, surgical, family and social history reviewed and updated as indicated. Interim medical history since our last visit reviewed. Allergies and medications reviewed and updated.  Review of Systems  Constitutional: Negative.   Respiratory: Negative.    Cardiovascular: Negative.   Gastrointestinal:  Positive for diarrhea. Negative for abdominal distention, abdominal pain, anal bleeding, blood in stool, constipation, nausea, rectal pain and vomiting.  Musculoskeletal: Negative.   Skin: Negative.   Neurological: Negative.   Psychiatric/Behavioral: Negative.      Per HPI unless specifically indicated above     Objective:  BP 138/90   Pulse 83   Temp 98.7 F (37.1 C) (Oral)   Wt 141 lb (64 kg)   SpO2 98%   BMI 23.46 kg/m   Wt Readings from Last 3 Encounters:  10/01/21 141 lb (64 kg)  09/07/21 139 lb 6.4 oz (63.2 kg)  07/29/21 144 lb 3.2 oz (65.4 kg)    Physical Exam Vitals and nursing note reviewed.  Constitutional:      General: She is not in acute distress.    Appearance: Normal appearance. She is not ill-appearing, toxic-appearing or diaphoretic.  HENT:     Head: Normocephalic and atraumatic.     Right Ear: External ear normal.     Left Ear: External ear normal.     Nose: Nose normal.     Mouth/Throat:     Mouth: Mucous membranes are moist.     Pharynx: Oropharynx is clear.  Eyes:     General: No scleral icterus.       Right eye: No discharge.        Left eye: No discharge.     Extraocular Movements: Extraocular movements intact.      Conjunctiva/sclera: Conjunctivae normal.     Pupils: Pupils are equal, round, and reactive to light.  Cardiovascular:     Rate and Rhythm: Normal rate and regular rhythm.     Pulses: Normal pulses.     Heart sounds: Normal heart sounds. No murmur heard.    No friction rub. No gallop.  Pulmonary:     Effort: Pulmonary effort is normal. No respiratory distress.     Breath sounds: Normal breath sounds. No stridor. No wheezing, rhonchi or rales.  Chest:     Chest wall: No tenderness.  Musculoskeletal:        General: Normal range of motion.     Cervical back: Normal range of motion and neck supple.  Skin:    General: Skin is warm and dry.     Capillary Refill: Capillary refill takes less than 2 seconds.     Coloration: Skin is not jaundiced or pale.     Findings: No bruising, erythema, lesion or rash.  Neurological:     General: No focal deficit present.     Mental Status: She is alert and oriented to person, place, and time. Mental status is at baseline.  Psychiatric:        Mood and Affect: Mood normal.        Behavior: Behavior normal.        Thought Content: Thought content normal.        Judgment: Judgment normal.     Results for orders placed or performed in visit on 09/07/21  CBC w/Diff  Result Value Ref Range   WBC 5.5 3.4 - 10.8 x10E3/uL   RBC 4.19 3.77 - 5.28 x10E6/uL   Hemoglobin 13.5 11.1 - 15.9 g/dL   Hematocrit 40.0 34.0 - 46.6 %   MCV 96 79 - 97 fL   MCH 32.2 26.6 - 33.0 pg   MCHC 33.8 31.5 - 35.7 g/dL   RDW 14.9 11.7 - 15.4 %   Platelets 299 150 - 450 x10E3/uL   Neutrophils 56 Not Estab. %   Lymphs 33 Not Estab. %   Monocytes 6 Not Estab. %   Eos 4 Not Estab. %   Basos 1 Not Estab. %   Neutrophils Absolute 3.1 1.4 - 7.0 x10E3/uL   Lymphocytes Absolute 1.8 0.7 - 3.1 x10E3/uL   Monocytes Absolute 0.3 0.1 -  0.9 x10E3/uL   EOS (ABSOLUTE) 0.2 0.0 - 0.4 x10E3/uL   Basophils Absolute 0.0 0.0 - 0.2 x10E3/uL   Immature Granulocytes 0 Not Estab. %   Immature  Grans (Abs) 0.0 0.0 - 0.1 x10E3/uL  Comp Met (CMET)  Result Value Ref Range   Glucose 107 (H) 70 - 99 mg/dL   BUN 9 6 - 24 mg/dL   Creatinine, Ser 0.62 0.57 - 1.00 mg/dL   eGFR 106 >59 mL/min/1.73   BUN/Creatinine Ratio 15 9 - 23   Sodium 143 134 - 144 mmol/L   Potassium 3.7 3.5 - 5.2 mmol/L   Chloride 106 96 - 106 mmol/L   CO2 23 20 - 29 mmol/L   Calcium 9.6 8.7 - 10.2 mg/dL   Total Protein 6.8 6.0 - 8.5 g/dL   Albumin 4.6 3.8 - 4.9 g/dL   Globulin, Total 2.2 1.5 - 4.5 g/dL   Albumin/Globulin Ratio 2.1 1.2 - 2.2   Bilirubin Total 0.3 0.0 - 1.2 mg/dL   Alkaline Phosphatase 141 (H) 44 - 121 IU/L   AST 29 0 - 40 IU/L   ALT 31 0 - 32 IU/L  C-reactive protein  Result Value Ref Range   CRP 6 0 - 10 mg/L      Assessment & Plan:   Problem List Items Addressed This Visit       Cardiovascular and Mediastinum   Essential hypertension    Running a little high today. Will treat diarrhea. Recheck next visit, if still high consider increasing lisinopril.        Other   Anxiety    Under good control on current regimen. Continue current regimen. Continue to monitor. Call with any concerns. Refills given for 3 months. Follow up 3 months.        Relevant Medications   DULoxetine (CYMBALTA) 60 MG capsule   LORazepam (ATIVAN) 0.5 MG tablet   Depression, recurrent ([place]) - Primary    Under good control on current regimen. Continue current regimen. Continue to monitor. Call with any concerns. Refills given for 3 months. Follow up 3 months.        Relevant Medications   DULoxetine (CYMBALTA) 60 MG capsule   LORazepam (ATIVAN) 0.5 MG tablet   Other Visit Diagnoses     Diarrhea, unspecified type       Has done well with carafate in the past. Will start. Stool kit given. If diarrhea continues will get samples. Await results.    Relevant Orders   Stool C-Diff Toxin Assay   Fecal occult blood, imunochemical([facility])   Ova and parasite examination   Fecal leukocytes   Stool  Culture        Follow up plan: Return in about 3 months (around 01/01/2022) for physical.

## 2021-10-01 NOTE — Assessment & Plan Note (Signed)
Under good control on current regimen. Continue current regimen. Continue to monitor. Call with any concerns. Refills given for 3 months. Follow up 3 months.    

## 2021-10-01 NOTE — Assessment & Plan Note (Signed)
Running a little high today. Will treat diarrhea. Recheck next visit, if still high consider increasing lisinopril.

## 2021-10-12 ENCOUNTER — Telehealth: Payer: BC Managed Care – PPO | Admitting: Physician Assistant

## 2021-10-12 DIAGNOSIS — B9689 Other specified bacterial agents as the cause of diseases classified elsewhere: Secondary | ICD-10-CM

## 2021-10-12 DIAGNOSIS — J019 Acute sinusitis, unspecified: Secondary | ICD-10-CM | POA: Diagnosis not present

## 2021-10-12 DIAGNOSIS — B379 Candidiasis, unspecified: Secondary | ICD-10-CM

## 2021-10-12 DIAGNOSIS — T3695XA Adverse effect of unspecified systemic antibiotic, initial encounter: Secondary | ICD-10-CM | POA: Diagnosis not present

## 2021-10-12 MED ORDER — AMOXICILLIN-POT CLAVULANATE 875-125 MG PO TABS: 1.0000 | ORAL_TABLET | Freq: Two times a day (BID) | ORAL | 0 refills | Status: DC

## 2021-10-12 MED ORDER — FLUCONAZOLE 150 MG PO TABS: 150.0000 mg | ORAL_TABLET | Freq: Once | ORAL | 0 refills | Status: AC

## 2021-10-12 NOTE — Progress Notes (Signed)
Virtual Visit Consent   Morgan Cisneros, you are scheduled for a virtual visit with a [facility] provider today. Just as with appointments in the office, your consent must be obtained to participate. Your consent will be active for this visit and any virtual visit you may have with one of our providers in the next 365 days. If you have a MyChart account, a copy of this consent can be sent to you electronically.  As this is a virtual visit, video technology does not allow for your provider to perform a traditional examination. This may limit your provider's ability to fully assess your condition. If your provider identifies any concerns that need to be evaluated in person or the need to arrange testing (such as labs, EKG, etc.), we will make arrangements to do so. Although advances in technology are sophisticated, we cannot ensure that it will always work on either your end or our end. If the connection with a video visit is poor, the visit may have to be switched to a telephone visit. With either a video or telephone visit, we are not always able to ensure that we have a secure connection.  By engaging in this virtual visit, you consent to the provision of healthcare and authorize for your insurance to be billed (if applicable) for the services provided during this visit. Depending on your insurance coverage, you may receive a charge related to this service.  I need to obtain your verbal consent now. Are you willing to proceed with your visit today? Morgan Cisneros has provided verbal consent on 10/12/2021 for a virtual visit (video or telephone). Mar Daring, PA-C  Date: 10/12/2021 9:22 AM  Virtual Visit via Video Note   I, Mar Daring, connected with  Morgan Cisneros  ([id number], March 26, 1966) on 10/12/21 at  9:15 AM EDT by a video-enabled telemedicine application and verified that I am speaking with the correct person using two identifiers.  Location: Patient: Virtual Visit  Location Patient: Home Provider: Virtual Visit Location Provider: Home Office   I discussed the limitations of evaluation and management by telemedicine and the availability of in person appointments. The patient expressed understanding and agreed to proceed.    History of Present Illness: Morgan Cisneros is a 56 y.o. who identifies as a female who was assigned female at birth, and is being seen today for sore throat and ear ache.  HPI: Sore Throat  This is a new problem. The current episode started in the past 7 days. The problem has been gradually worsening. Maximum temperature: 99. The fever has been present for Less than 1 day. Associated symptoms include congestion, coughing, ear pain (bilateral) and headaches. Pertinent negatives include no hoarse voice, shortness of breath, swollen glands or trouble swallowing. Treatments tried: mucinex and sudafed. The treatment provided no relief.      Problems:  Patient Active Problem List   Diagnosis Date Noted   Palpitations 02/12/2021   Dupuytren's contracture 01/04/2019   Essential hypertension 06/29/2018   Depression, recurrent ([place]) 06/29/2018   Benign neoplasm of ascending colon    Anxiety 04/18/2017   Spasm of back muscles 10/28/2016   Diverticulitis 10/19/2016   GERD (gastroesophageal reflux disease) 04/18/2015   Menopausal symptoms 04/18/2015   Insomnia    Family history of colon cancer in mother 01/15/2015   Family history of ovarian cancer 01/15/2015   Tobacco abuse 01/15/2015   Lymphadenopathy, axillary 01/15/2015   Calculus of kidney 12/13/2014   Female genuine stress incontinence  12/13/2014    Allergies:  Allergies  Allergen Reactions   Wellbutrin [Bupropion] Other (See Comments)    irritiability   Urocit-K [Potassium Citrate] Nausea Only   Medications:  Current Outpatient Medications:    amoxicillin-clavulanate (AUGMENTIN) 875-125 MG tablet, Take 1 tablet by mouth 2 (two) times daily., Disp: 14 tablet, Rfl: 0    fluconazole (DIFLUCAN) 150 MG tablet, Take 1 tablet (150 mg total) by mouth once for 1 dose., Disp: 1 tablet, Rfl: 0   DULoxetine (CYMBALTA) 60 MG capsule, TAKE 1 CAPSULE(60 MG) BY MOUTH DAILY, Disp: 90 capsule, Rfl: 1   lisinopril (ZESTRIL) 10 MG tablet, Take 1.5 tablets (15 mg total) by mouth daily., Disp: 135 tablet, Rfl: 1   LORazepam (ATIVAN) 0.5 MG tablet, Take 1 tablet (0.5 mg total) by mouth 2 (two) times daily as needed., Disp: 45 tablet, Rfl: 0   [START ON 10/31/2021] LORazepam (ATIVAN) 0.5 MG tablet, Take 1 tablet (0.5 mg total) by mouth 2 (two) times daily as needed for anxiety., Disp: 45 tablet, Rfl: 1   metoprolol succinate (TOPROL-XL) 100 MG 24 hr tablet, TAKE 1 TABLET(100 MG) BY MOUTH DAILY, Disp: 90 tablet, Rfl: 1   ondansetron (ZOFRAN) 4 MG tablet, Take 1 tablet (4 mg total) by mouth every 8 (eight) hours as needed for nausea or vomiting., Disp: 20 tablet, Rfl: 0   sucralfate (CARAFATE) 1 g tablet, Take 1 tablet (1 g total) by mouth 4 (four) times daily -  with meals and at bedtime., Disp: 120 tablet, Rfl: 3  Observations/Objective: Patient is well-developed, well-nourished in no acute distress.  Resting comfortably at home.  Head is normocephalic, atraumatic.  No labored breathing.  Speech is clear and coherent with logical content.  Patient is alert and oriented at baseline.    Assessment and Plan: 1. Acute bacterial sinusitis - amoxicillin-clavulanate (AUGMENTIN) 875-125 MG tablet; Take 1 tablet by mouth 2 (two) times daily.  Dispense: 14 tablet; Refill: 0  2. Antibiotic-induced yeast infection - fluconazole (DIFLUCAN) 150 MG tablet; Take 1 tablet (150 mg total) by mouth once for 1 dose.  Dispense: 1 tablet; Refill: 0  - Worsening symptoms that have not responded to OTC medications.  - Will give Augmentin - Continue allergy medications.  - Steam and humidifier can help - Stay well hydrated and get plenty of rest.  - Seek in person evaluation if no symptom improvement  or if symptoms worsen   Follow Up Instructions: I discussed the assessment and treatment plan with the patient. The patient was provided an opportunity to ask questions and all were answered. The patient agreed with the plan and demonstrated an understanding of the instructions.  A copy of instructions were sent to the patient via MyChart unless otherwise noted below.    The patient was advised to call back or seek an in-person evaluation if the symptoms worsen or if the condition fails to improve as anticipated.  Time:  I spent 10 minutes with the patient via telehealth technology discussing the above problems/concerns.    Mar Daring, PA-C

## 2021-10-12 NOTE — Patient Instructions (Signed)
[name], thank you for joining Mar Daring, PA-C for today's virtual visit.  While this provider is not your primary care provider (PCP), if your PCP is located in our provider database this encounter information will be shared with them immediately following your visit.  Consent: (Patient) [name] provided verbal consent for this virtual visit at the beginning of the encounter.  Current Medications:  Current Outpatient Medications:    amoxicillin-clavulanate (AUGMENTIN) 875-125 MG tablet, Take 1 tablet by mouth 2 (two) times daily., Disp: 14 tablet, Rfl: 0   fluconazole (DIFLUCAN) 150 MG tablet, Take 1 tablet (150 mg total) by mouth once for 1 dose., Disp: 1 tablet, Rfl: 0   DULoxetine (CYMBALTA) 60 MG capsule, TAKE 1 CAPSULE(60 MG) BY MOUTH DAILY, Disp: 90 capsule, Rfl: 1   lisinopril (ZESTRIL) 10 MG tablet, Take 1.5 tablets (15 mg total) by mouth daily., Disp: 135 tablet, Rfl: 1   LORazepam (ATIVAN) 0.5 MG tablet, Take 1 tablet (0.5 mg total) by mouth 2 (two) times daily as needed., Disp: 45 tablet, Rfl: 0   [START ON 10/31/2021] LORazepam (ATIVAN) 0.5 MG tablet, Take 1 tablet (0.5 mg total) by mouth 2 (two) times daily as needed for anxiety., Disp: 45 tablet, Rfl: 1   metoprolol succinate (TOPROL-XL) 100 MG 24 hr tablet, TAKE 1 TABLET(100 MG) BY MOUTH DAILY, Disp: 90 tablet, Rfl: 1   ondansetron (ZOFRAN) 4 MG tablet, Take 1 tablet (4 mg total) by mouth every 8 (eight) hours as needed for nausea or vomiting., Disp: 20 tablet, Rfl: 0   sucralfate (CARAFATE) 1 g tablet, Take 1 tablet (1 g total) by mouth 4 (four) times daily -  with meals and at bedtime., Disp: 120 tablet, Rfl: 3   Medications ordered in this encounter:  Meds ordered this encounter  Medications   amoxicillin-clavulanate (AUGMENTIN) 875-125 MG tablet    Sig: Take 1 tablet by mouth 2 (two) times daily.    Dispense:  14 tablet    Refill:  0    Order Specific Question:   Supervising Provider     Answer:   MILLER, BRIAN [[id number]]   fluconazole (DIFLUCAN) 150 MG tablet    Sig: Take 1 tablet (150 mg total) by mouth once for 1 dose.    Dispense:  1 tablet    Refill:  0    Order Specific Question:   Supervising Provider    Answer:   Sabra Heck, BRIAN [[id number]]     *If you need refills on other medications prior to your next appointment, please contact your pharmacy*  Follow-Up: Call back or seek an in-person evaluation if the symptoms worsen or if the condition fails to improve as anticipated.  Other Instructions Sinus Infection, Adult A sinus infection, also called sinusitis, is inflammation of your sinuses. Sinuses are hollow spaces in the bones around your face. Your sinuses are located: Around your eyes. In the middle of your forehead. Behind your nose. In your cheekbones. Mucus normally drains out of your sinuses. When your nasal tissues become inflamed or swollen, mucus can become trapped or blocked. This allows bacteria, viruses, and fungi to grow, which leads to infection. Most infections of the sinuses are caused by a virus. A sinus infection can develop quickly. It can last for up to 4 weeks (acute) or for more than 12 weeks (chronic). A sinus infection often develops after a cold. What are the causes? This condition is caused by anything that creates swelling in the sinuses or stops  mucus from draining. This includes: Allergies. Asthma. Infection from bacteria or viruses. Deformities or blockages in your nose or sinuses. Abnormal growths in the nose (nasal polyps). Pollutants, such as chemicals or irritants in the air. Infection from fungi. This is rare. What increases the risk? You are more likely to develop this condition if you: Have a weak body defense system (immune system). Do a lot of swimming or diving. Overuse nasal sprays. Smoke. What are the signs or symptoms? The main symptoms of this condition are pain and a feeling of pressure around the affected sinuses.  Other symptoms include: Stuffy nose or congestion that makes it difficult to breathe through your nose. Thick yellow or greenish drainage from your nose. Tenderness, swelling, and warmth over the affected sinuses. A cough that may get worse at night. Decreased sense of smell and taste. Extra mucus that collects in the throat or the back of the nose (postnasal drip) causing a sore throat or bad breath. Tiredness (fatigue). Fever. How is this diagnosed? This condition is diagnosed based on: Your symptoms. Your medical history. A physical exam. Tests to find out if your condition is acute or chronic. This may include: Checking your nose for nasal polyps. Viewing your sinuses using a device that has a light (endoscope). Testing for allergies or bacteria. Imaging tests, such as an MRI or CT scan. In rare cases, a bone biopsy may be done to rule out more serious types of fungal sinus disease. How is this treated? Treatment for a sinus infection depends on the cause and whether your condition is chronic or acute. If caused by a virus, your symptoms should go away on their own within 10 days. You may be given medicines to relieve symptoms. They include: Medicines that shrink swollen nasal passages (decongestants). A spray that eases inflammation of the nostrils (topical intranasal corticosteroids). Rinses that help get rid of thick mucus in your nose (nasal saline washes). Medicines that treat allergies (antihistamines). Over-the-counter pain relievers. If caused by bacteria, your health care provider may recommend waiting to see if your symptoms improve. Most bacterial infections will get better without antibiotic medicine. You may be given antibiotics if you have: A severe infection. A weak immune system. If caused by narrow nasal passages or nasal polyps, surgery may be needed. Follow these instructions at home: Medicines Take, use, or apply over-the-counter and prescription medicines  only as told by your health care provider. These may include nasal sprays. If you were prescribed an antibiotic medicine, take it as told by your health care provider. Do not stop taking the antibiotic even if you start to feel better. Hydrate and humidify  Drink enough fluid to keep your urine pale yellow. Staying hydrated will help to thin your mucus. Use a cool mist humidifier to keep the humidity level in your home above 50%. Inhale steam for 10-15 minutes, 3-4 times a day, or as told by your health care provider. You can do this in the bathroom while a hot shower is running. Limit your exposure to cool or dry air. Rest Rest as much as possible. Sleep with your head raised (elevated). Make sure you get enough sleep each night. General instructions  Apply a warm, moist washcloth to your face 3-4 times a day or as told by your health care provider. This will help with discomfort. Use nasal saline washes as often as told by your health care provider. Wash your hands often with soap and water to reduce your exposure to germs. If  soap and water are not available, use hand sanitizer. Do not smoke. Avoid being around people who are smoking (secondhand smoke). Keep all follow-up visits. This is important. Contact a health care provider if: You have a fever. Your symptoms get worse. Your symptoms do not improve within 10 days. Get help right away if: You have a severe headache. You have persistent vomiting. You have severe pain or swelling around your face or eyes. You have vision problems. You develop confusion. Your neck is stiff. You have trouble breathing. These symptoms may be an emergency. Get help right away. Call 911. Do not wait to see if the symptoms will go away. Do not drive yourself to the hospital. Summary A sinus infection is soreness and inflammation of your sinuses. Sinuses are hollow spaces in the bones around your face. This condition is caused by nasal tissues that  become inflamed or swollen. The swelling traps or blocks the flow of mucus. This allows bacteria, viruses, and fungi to grow, which leads to infection. If you were prescribed an antibiotic medicine, take it as told by your health care provider. Do not stop taking the antibiotic even if you start to feel better. Keep all follow-up visits. This is important. This information is not intended to replace advice given to you by your health care provider. Make sure you discuss any questions you have with your health care provider. Document Revised: 02/24/2021 Document Reviewed: 02/24/2021 Elsevier Patient Education  [place].    If you have been instructed to have an in-person evaluation today at a local Urgent Care facility, please use the link below. It will take you to a list of all of our available [facility], including address, phone number and hours of operation. Please do not delay care.  [facility]  If you or a family member do not have a primary care provider, use the link below to schedule a visit and establish care. When you choose a [facility] primary care physician or advanced practice provider, you gain a long-term partner in health. Find a Primary Care Provider  Learn more about [facility] in-office and virtual care options: [facility]

## 2021-10-14 ENCOUNTER — Encounter: Payer: Self-pay | Admitting: Family Medicine

## 2021-10-14 NOTE — Telephone Encounter (Signed)
LVM asking patient to call back to schedule an appointment 

## 2021-10-14 NOTE — Telephone Encounter (Signed)
Patient scheduled Monday with Dr. Wynetta Emery

## 2021-10-19 ENCOUNTER — Encounter: Payer: Self-pay | Admitting: Family Medicine

## 2021-10-19 ENCOUNTER — Ambulatory Visit: Payer: BC Managed Care – PPO | Admitting: Family Medicine

## 2021-10-19 VITALS — BP 112/80 | HR 79 | Temp 97.9°F | Ht 65.0 in | Wt 136.8 lb

## 2021-10-19 DIAGNOSIS — F339 Major depressive disorder, recurrent, unspecified: Secondary | ICD-10-CM

## 2021-10-19 MED ORDER — DULOXETINE HCL 30 MG PO CPEP: 30.0000 mg | ORAL_CAPSULE | Freq: Every day | ORAL | 1 refills | Status: DC

## 2021-10-19 NOTE — Progress Notes (Signed)
BP 112/80   Pulse 79   Temp 97.9 F (36.6 C) (Oral)   Ht 5\' 5"  (1.651 m)   Wt 136 lb 12.8 oz (62.1 kg)   SpO2 98%   BMI 22.76 kg/m    Subjective:    Patient ID: Morgan Cisneros, female    DOB: [birth year], 56 y.o.   MRN: [id number]  HPI: Roselee Tayloe is a 56 y.o. female  Chief Complaint  Patient presents with   Depression    Patient would like to have her Cymbalta increased to 90 mg.   DEPRESSION Mood status: exacerbated Satisfied with current treatment?: no Symptom severity: moderate  Duration of current treatment : chronic Side effects: no Medication compliance: excellent compliance Psychotherapy/counseling: yes in the past Previous psychiatric medications: cymbalta, wellbutrin Depressed mood: yes Anxious mood: yes Anhedonia: no Significant weight loss or gain: no Insomnia: no  Fatigue: yes Feelings of worthlessness or guilt: no Impaired concentration/indecisiveness: no Suicidal ideations: no Hopelessness: no Crying spells: no    10/19/2021    9:00 AM 10/01/2021    4:26 PM 09/07/2021    8:33 AM 07/29/2021    9:15 AM 07/09/2021    3:18 PM  Depression screen PHQ 2/9  Decreased Interest 3 2 2 2 2   Down, Depressed, Hopeless 3 2 3 2 3   PHQ - 2 Score 6 4 5 4 5   Altered sleeping 2 2 3 3 2   Tired, decreased energy 2 2 2 3 2   Change in appetite 0 2 3 2  0  Feeling bad or failure about yourself  3 2 2 1 2   Trouble concentrating 3 2 2 3 2   Moving slowly or fidgety/restless 0 0 0 0 0  Suicidal thoughts 0 0 0 0 0  PHQ-9 Score 16 14 17 16 13   Difficult doing work/chores Somewhat difficult Somewhat difficult       Relevant past medical, surgical, family and social history reviewed and updated as indicated. Interim medical history since our last visit reviewed. Allergies and medications reviewed and updated.  Review of Systems  Constitutional: Negative.   Respiratory: Negative.    Cardiovascular: Negative.   Musculoskeletal: Negative.   Neurological:  Negative.   Psychiatric/Behavioral:  Positive for dysphoric mood. Negative for agitation, behavioral problems, confusion, decreased concentration, hallucinations, self-injury, sleep disturbance and suicidal ideas. The patient is nervous/anxious. The patient is not hyperactive.     Per HPI unless specifically indicated above     Objective:    BP 112/80   Pulse 79   Temp 97.9 F (36.6 C) (Oral)   Ht 5\' 5"  (1.651 m)   Wt 136 lb 12.8 oz (62.1 kg)   SpO2 98%   BMI 22.76 kg/m   Wt Readings from Last 3 Encounters:  10/19/21 136 lb 12.8 oz (62.1 kg)  10/01/21 141 lb (64 kg)  09/07/21 139 lb 6.4 oz (63.2 kg)    Physical Exam Vitals and nursing note reviewed.  Constitutional:      General: She is not in acute distress.    Appearance: Normal appearance. She is normal weight. She is not ill-appearing, toxic-appearing or diaphoretic.  HENT:     Head: Normocephalic and atraumatic.     Right Ear: External ear normal.     Left Ear: External ear normal.     Nose: Nose normal.     Mouth/Throat:     Mouth: Mucous membranes are moist.     Pharynx: Oropharynx is clear.  Eyes:     General:  No scleral icterus.       Right eye: No discharge.        Left eye: No discharge.     Extraocular Movements: Extraocular movements intact.     Conjunctiva/sclera: Conjunctivae normal.     Pupils: Pupils are equal, round, and reactive to light.  Cardiovascular:     Rate and Rhythm: Normal rate and regular rhythm.     Pulses: Normal pulses.     Heart sounds: Normal heart sounds. No murmur heard.    No friction rub. No gallop.  Pulmonary:     Effort: Pulmonary effort is normal. No respiratory distress.     Breath sounds: Normal breath sounds. No stridor. No wheezing, rhonchi or rales.  Chest:     Chest wall: No tenderness.  Musculoskeletal:        General: Normal range of motion.     Cervical back: Normal range of motion and neck supple.  Skin:    General: Skin is warm and dry.     Capillary  Refill: Capillary refill takes less than 2 seconds.     Coloration: Skin is not jaundiced or pale.     Findings: No bruising, erythema, lesion or rash.  Neurological:     General: No focal deficit present.     Mental Status: She is alert and oriented to person, place, and time. Mental status is at baseline.  Psychiatric:        Mood and Affect: Mood normal.        Behavior: Behavior normal.        Thought Content: Thought content normal.        Judgment: Judgment normal.     Results for orders placed or performed in visit on 09/07/21  CBC w/Diff  Result Value Ref Range   WBC 5.5 3.4 - 10.8 x10E3/uL   RBC 4.19 3.77 - 5.28 x10E6/uL   Hemoglobin 13.5 11.1 - 15.9 g/dL   Hematocrit 40.0 34.0 - 46.6 %   MCV 96 79 - 97 fL   MCH 32.2 26.6 - 33.0 pg   MCHC 33.8 31.5 - 35.7 g/dL   RDW 14.9 11.7 - 15.4 %   Platelets 299 150 - 450 x10E3/uL   Neutrophils 56 Not Estab. %   Lymphs 33 Not Estab. %   Monocytes 6 Not Estab. %   Eos 4 Not Estab. %   Basos 1 Not Estab. %   Neutrophils Absolute 3.1 1.4 - 7.0 x10E3/uL   Lymphocytes Absolute 1.8 0.7 - 3.1 x10E3/uL   Monocytes Absolute 0.3 0.1 - 0.9 x10E3/uL   EOS (ABSOLUTE) 0.2 0.0 - 0.4 x10E3/uL   Basophils Absolute 0.0 0.0 - 0.2 x10E3/uL   Immature Granulocytes 0 Not Estab. %   Immature Grans (Abs) 0.0 0.0 - 0.1 x10E3/uL  Comp Met (CMET)  Result Value Ref Range   Glucose 107 (H) 70 - 99 mg/dL   BUN 9 6 - 24 mg/dL   Creatinine, Ser 0.62 0.57 - 1.00 mg/dL   eGFR 106 >59 mL/min/1.73   BUN/Creatinine Ratio 15 9 - 23   Sodium 143 134 - 144 mmol/L   Potassium 3.7 3.5 - 5.2 mmol/L   Chloride 106 96 - 106 mmol/L   CO2 23 20 - 29 mmol/L   Calcium 9.6 8.7 - 10.2 mg/dL   Total Protein 6.8 6.0 - 8.5 g/dL   Albumin 4.6 3.8 - 4.9 g/dL   Globulin, Total 2.2 1.5 - 4.5 g/dL   Albumin/Globulin Ratio 2.1 1.2 -  2.2   Bilirubin Total 0.3 0.0 - 1.2 mg/dL   Alkaline Phosphatase 141 (H) 44 - 121 IU/L   AST 29 0 - 40 IU/L   ALT 31 0 - 32 IU/L  C-reactive  protein  Result Value Ref Range   CRP 6 0 - 10 mg/L      Assessment & Plan:   Problem List Items Addressed This Visit       Other   Depression, recurrent ([place]) - Primary    Doing a little worse. Will increase her cymbalta back to $Remov'90mg'[id number]$  and recheck 2 months. Call with any concerns.       Relevant Medications   DULoxetine (CYMBALTA) 30 MG capsule     Follow up plan: Return as scheduled.

## 2021-10-19 NOTE — Assessment & Plan Note (Signed)
Doing a little worse. Will increase her cymbalta back to '90mg'$  and recheck 2 months. Call with any concerns.

## 2021-11-03 ENCOUNTER — Other Ambulatory Visit: Payer: Self-pay | Admitting: Family Medicine

## 2021-11-04 NOTE — Telephone Encounter (Signed)
Refilled 06/12/2021 #90 1 refill. - a 6 month supply. Requested Prescriptions  Pending Prescriptions Disp Refills  . metoprolol succinate (TOPROL-XL) 100 MG 24 hr tablet [Pharmacy Med Name: METOPROLOL SUCCINATE ER 100 MG TAB] 90 tablet 1    Sig: TAKE 1 TABLET BY MOUTH ONCE DAILY     Cardiovascular:  Beta Blockers Passed - 11/03/2021  2:07 PM      Passed - Last BP in normal range    BP Readings from Last 1 Encounters:  10/19/21 112/80         Passed - Last Heart Rate in normal range    Pulse Readings from Last 1 Encounters:  10/19/21 79         Passed - Valid encounter within last 6 months    Recent Outpatient Visits          2 weeks ago Depression, recurrent ([place])   [place], Megan P, DO   1 month ago Depression, recurrent ([place])   [place], Megan P, DO   1 month ago Diarrhea, unspecified type   [employer], Dani Gobble, PA-C   3 months ago Essential hypertension   [place], [place], DO   3 months ago Essential hypertension   [place], [place], DO      Future Appointments            In 1 month Johnson, Barb Merino, DO [employer]

## 2021-11-27 ENCOUNTER — Encounter: Payer: Self-pay | Admitting: Nurse Practitioner

## 2021-11-27 ENCOUNTER — Ambulatory Visit: Payer: BC Managed Care – PPO | Admitting: Nurse Practitioner

## 2021-11-27 VITALS — BP 122/83 | HR 70 | Temp 98.3°F | Ht 65.0 in | Wt 139.9 lb

## 2021-11-27 DIAGNOSIS — K641 Second degree hemorrhoids: Secondary | ICD-10-CM | POA: Diagnosis not present

## 2021-11-27 MED ORDER — HYDROCORTISONE (PERIANAL) 2.5 % EX CREA: 1.0000 | TOPICAL_CREAM | Freq: Two times a day (BID) | CUTANEOUS | 2 refills | Status: DC

## 2021-11-27 NOTE — Progress Notes (Signed)
BP 122/83   Pulse 70   Temp 98.3 F (36.8 C) (Oral)   Ht _0  (1.651 m)   Wt 139 lb 14.4 oz (63.5 kg)   SpO2 98%   BMI 23.28 kg/m    Subjective:    Patient ID: Morgan Cisneros, female    DOB: 1966/01/25, 56 y.o.   MRN: [id number]  HPI: Morgan Cisneros is a 56 y.o. female  Chief Complaint  Patient presents with   Hemorrhoids    Patient is here for a Hemorrhoids. Patient says she had issues with a Thrombosed Hemorrhoids before and was referred to General Surgery to have it lanced. Patient says this past week she noticed some discomfort and pain in her back and bottom area. Patient says she has had Hemorrhoidectomy and Band Procedure. Patient says she has been using Recta-Care. Patient denies having any pain and discomfort at the moment and does not want it getting worse.    HEMORRHOIDS Ongoing issue with recurrent flares -- has had surgery to hemorrhoids x 1 and then banding.  Started after having children.  Recently started having some discomfort on Monday, low back pain and pressure -- yesterday it was full pressure and thrombosed.   Duration: days Anal fullness: yes Perianal itching/irritation: no Perianal pain: yes - taking 4 Ibuprofen every 6 hours Bright red rectal bleeding: no Constipation: no -- does have IBS issues underlying Hard stools: no Chronic straining/valsava: no Alleviating factors:  Aggravating factors: Status: fluctuating Treatments attempted: Recta Care and Ibuprofen  Previous hemorrhoids: yes  Colonoscopy: yes -- due January 2024  Relevant past medical, surgical, family and social history reviewed and updated as indicated. Interim medical history since our last visit reviewed. Allergies and medications reviewed and updated.  Review of Systems  Constitutional:  Negative for activity change, appetite change, diaphoresis, fatigue and fever.  Respiratory:  Negative for cough, chest tightness and shortness of breath.   Cardiovascular:  Negative for  chest pain, palpitations and leg swelling.  Gastrointestinal:  Positive for rectal pain. Negative for abdominal distention, abdominal pain, anal bleeding, blood in stool and constipation.  Neurological: Negative.   Psychiatric/Behavioral: Negative.      Per HPI unless specifically indicated above     Objective:    BP 122/83   Pulse 70   Temp 98.3 F (36.8 C) (Oral)   Ht _1  (1.651 m)   Wt 139 lb 14.4 oz (63.5 kg)   SpO2 98%   BMI 23.28 kg/m   Wt Readings from Last 3 Encounters:  11/27/21 139 lb 14.4 oz (63.5 kg)  10/19/21 136 lb 12.8 oz (62.1 kg)  10/01/21 141 lb (64 kg)    Physical Exam Vitals and nursing note reviewed. Exam conducted with a chaperone present.  Constitutional:      General: She is awake. She is not in acute distress.    Appearance: She is well-developed and well-groomed. She is not ill-appearing or toxic-appearing.  HENT:     Head: Normocephalic.     Right Ear: Hearing normal.     Left Ear: Hearing normal.  Eyes:     General: Lids are normal.        Right eye: No discharge.        Left eye: No discharge.     Conjunctiva/sclera: Conjunctivae normal.     Pupils: Pupils are equal, round, and reactive to light.  Neck:     Thyroid: No thyromegaly.     Vascular: No carotid bruit.  Cardiovascular:  Rate and Rhythm: Normal rate and regular rhythm.     Heart sounds: Normal heart sounds. No murmur heard.    No gallop.  Pulmonary:     Effort: Pulmonary effort is normal. No accessory muscle usage or respiratory distress.     Breath sounds: Normal breath sounds.  Abdominal:     General: Bowel sounds are normal.     Palpations: Abdomen is soft.  Genitourinary:    Rectum: External hemorrhoid present. No tenderness.     Comments: At approx 10 o'clock there is a moderate size thrombosed hemorrhoid protruding.  Not tender. Musculoskeletal:     Cervical back: Normal range of motion and neck supple.     Right lower leg: No edema.     Left lower leg: No  edema.  Lymphadenopathy:     Cervical: No cervical adenopathy.  Skin:    General: Skin is warm and dry.  Neurological:     Mental Status: She is alert and oriented to person, place, and time.     Deep Tendon Reflexes: Reflexes are normal and symmetric.     Reflex Scores:      Brachioradialis reflexes are 2+ on the right side and 2+ on the left side.      Patellar reflexes are 2+ on the right side and 2+ on the left side. Psychiatric:        Attention and Perception: Attention normal.        Mood and Affect: Mood normal.        Speech: Speech normal.        Behavior: Behavior normal. Behavior is cooperative.        Thought Content: Thought content normal.    Results for orders placed or performed in visit on 09/07/21  CBC w/Diff  Result Value Ref Range   WBC 5.5 3.4 - 10.8 x10E3/uL   RBC 4.19 3.77 - 5.28 x10E6/uL   Hemoglobin 13.5 11.1 - 15.9 g/dL   Hematocrit 40.0 34.0 - 46.6 %   MCV 96 79 - 97 fL   MCH 32.2 26.6 - 33.0 pg   MCHC 33.8 31.5 - 35.7 g/dL   RDW 14.9 11.7 - 15.4 %   Platelets 299 150 - 450 x10E3/uL   Neutrophils 56 Not Estab. %   Lymphs 33 Not Estab. %   Monocytes 6 Not Estab. %   Eos 4 Not Estab. %   Basos 1 Not Estab. %   Neutrophils Absolute 3.1 1.4 - 7.0 x10E3/uL   Lymphocytes Absolute 1.8 0.7 - 3.1 x10E3/uL   Monocytes Absolute 0.3 0.1 - 0.9 x10E3/uL   EOS (ABSOLUTE) 0.2 0.0 - 0.4 x10E3/uL   Basophils Absolute 0.0 0.0 - 0.2 x10E3/uL   Immature Granulocytes 0 Not Estab. %   Immature Grans (Abs) 0.0 0.0 - 0.1 x10E3/uL  Comp Met (CMET)  Result Value Ref Range   Glucose 107 (H) 70 - 99 mg/dL   BUN 9 6 - 24 mg/dL   Creatinine, Ser 0.62 0.57 - 1.00 mg/dL   eGFR 106 >59 mL/min/1.73   BUN/Creatinine Ratio 15 9 - 23   Sodium 143 134 - 144 mmol/L   Potassium 3.7 3.5 - 5.2 mmol/L   Chloride 106 96 - 106 mmol/L   CO2 23 20 - 29 mmol/L   Calcium 9.6 8.7 - 10.2 mg/dL   Total Protein 6.8 6.0 - 8.5 g/dL   Albumin 4.6 3.8 - 4.9 g/dL   Globulin, Total 2.2 1.5 -  4.5 g/dL   Albumin/Globulin  Ratio 2.1 1.2 - 2.2   Bilirubin Total 0.3 0.0 - 1.2 mg/dL   Alkaline Phosphatase 141 (H) 44 - 121 IU/L   AST 29 0 - 40 IU/L   ALT 31 0 - 32 IU/L  C-reactive protein  Result Value Ref Range   CRP 6 0 - 10 mg/L      Assessment & Plan:   Problem List Items Addressed This Visit       Cardiovascular and Mediastinum   Grade II hemorrhoids - Primary    Ongoing recurrent issues, current flare with mild thrombosed hemorrhoid present.  Will place referral to general surgery for assessment per request and start Anusol.  She had underlying IBS which does irritate hemorrhoids at times.        Relevant Orders   Ambulatory referral to General Surgery     Follow up plan: Return if symptoms worsen or fail to improve.

## 2021-11-27 NOTE — Patient Instructions (Signed)
Hemorrhoids Hemorrhoids are swollen veins that may develop: In the butt (rectum). These are called internal hemorrhoids. Around the opening of the butt (anus). These are called external hemorrhoids. Hemorrhoids can cause pain, itching, or bleeding. Most of the time, they do not cause serious problems. They usually get better with diet changes, lifestyle changes, and other home treatments. What are the causes? This condition may be caused by: Having trouble pooping (constipation). Pushing hard (straining) to poop. Watery poop (diarrhea). Pregnancy. Being very overweight (obese). Sitting for long periods of time. Heavy lifting or other activity that causes you to strain. Anal sex. Riding a bike for a long period of time. What are the signs or symptoms? Symptoms of this condition include: Pain. Itching or soreness in the butt. Bleeding from the butt. Leaking poop. Swelling in the area. One or more lumps around the opening of your butt. How is this diagnosed? A doctor can often diagnose this condition by looking at the affected area. The doctor may also: Do an exam that involves feeling the area with a gloved hand (digital rectal exam). Examine the area inside your butt using a small tube (anoscope). Order blood tests. This may be done if you have lost a lot of blood. Have you get a test that involves looking inside the colon using a flexible tube with a camera on the end (sigmoidoscopy or colonoscopy). How is this treated? This condition can usually be treated at home. Your doctor may tell you to change what you eat, make lifestyle changes, or try home treatments. If these do not help, procedures can be done to remove the hemorrhoids or make them smaller. These may involve: Placing rubber bands at the base of the hemorrhoids to cut off their blood supply. Injecting medicine into the hemorrhoids to shrink them. Shining a type of light energy onto the hemorrhoids to cause them to fall  off. Doing surgery to remove the hemorrhoids or cut off their blood supply. Follow these instructions at home: Eating and drinking  Eat foods that have a lot of fiber in them. These include whole grains, beans, nuts, fruits, and vegetables. Ask your doctor about taking products that have added fiber (fibersupplements). Reduce the amount of fat in your diet. You can do this by: Eating low-fat dairy products. Eating less red meat. Avoiding processed foods. Drink enough fluid to keep your pee (urine) pale yellow. Managing pain and swelling  Take a warm-water bath (sitz bath) for 20 minutes to ease pain. Do this 3-4 times a day. You may do this in a bathtub or using a portable sitz bath that fits over the toilet. If told, put ice on the painful area. It may be helpful to use ice between your warm baths. Put ice in a plastic bag. Place a towel between your skin and the bag. Leave the ice on for 20 minutes, 2-3 times a day. General instructions Take over-the-counter and prescription medicines only as told by your doctor. Medicated creams and medicines may be used as told. Exercise often. Ask your doctor how much and what kind of exercise is best for you. Go to the bathroom when you have the urge to poop. Do not wait. Avoid pushing too hard when you poop. Keep your butt dry and clean. Use wet toilet paper or moist towelettes after pooping. Do not sit on the toilet for a long time. Keep all follow-up visits as told by your doctor. This is important. Contact a doctor if you: Have pain and   swelling that do not get better with treatment or medicine. Have trouble pooping. Cannot poop. Have pain or swelling outside the area of the hemorrhoids. Get help right away if you have: Bleeding that will not stop. Summary Hemorrhoids are swollen veins in the butt or around the opening of the butt. They can cause pain, itching, or bleeding. Eat foods that have a lot of fiber in them. These include  whole grains, beans, nuts, fruits, and vegetables. Take a warm-water bath (sitz bath) for 20 minutes to ease pain. Do this 3-4 times a day. This information is not intended to replace advice given to you by your health care provider. Make sure you discuss any questions you have with your health care provider. Document Revised: 10/01/2020 Document Reviewed: 10/01/2020 Elsevier Patient Education  [employer].

## 2021-11-27 NOTE — Assessment & Plan Note (Signed)
Ongoing recurrent issues, current flare with mild thrombosed hemorrhoid present.  Will place referral to general surgery for assessment per request and start Anusol.  She had underlying IBS which does irritate hemorrhoids at times.

## 2021-12-02 ENCOUNTER — Ambulatory Visit: Payer: BC Managed Care – PPO | Admitting: Surgery

## 2021-12-06 ENCOUNTER — Ambulatory Visit
Admission: EM | Admit: 2021-12-06 | Discharge: 2021-12-06 | Disposition: A | Discharge status: Home / Self Care | Payer: BC Managed Care – PPO | Attending: Emergency Medicine | Admitting: Emergency Medicine

## 2021-12-06 DIAGNOSIS — L03031 Cellulitis of right toe: Secondary | ICD-10-CM | POA: Diagnosis not present

## 2021-12-06 MED ORDER — AMOXICILLIN-POT CLAVULANATE 875-125 MG PO TABS: 1.0000 | ORAL_TABLET | Freq: Two times a day (BID) | ORAL | 0 refills | Status: AC

## 2021-12-06 NOTE — ED Provider Notes (Signed)
[facility]    CSN: [id number] Arrival date & time: 12/06/21  1242      History   Chief Complaint Chief Complaint  Patient presents with   Toe Pain    HPI Morgan Cisneros is a 56 y.o. female.   HPI  56 year old female here for evaluation of right toe pain.  Patient reports that her symptoms began 4 days ago and consist of redness, swelling, and throbbing to her right great toe.  The symptoms began after she had a pedicure and she thinks that her nail was cut too short.  She has not had a fever and her main complaint is that her toe itches.  The toe has been draining a yellow-green drainage.  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   Insomnia    LBP (low back pain) 12/13/2014   Ovarian cyst    Renal calculus, right    Renal calculus, right    Thoracic back pain    Right sided    Patient Active Problem List   Diagnosis Date Noted   Grade II hemorrhoids 11/27/2021   Elevated liver enzymes 06/19/2021   Palpitations 02/12/2021   Dupuytren's contracture 01/04/2019   Essential hypertension 06/29/2018   Depression, recurrent ([place]) 06/29/2018   Benign neoplasm of ascending colon    Anxiety 04/18/2017   Spasm of back muscles 10/28/2016   Diverticulitis 10/19/2016   GERD (gastroesophageal reflux disease) 04/18/2015   Menopausal symptoms 04/18/2015   Insomnia    Family history of colon cancer in mother 01/15/2015   Family history of ovarian cancer 01/15/2015   Nicotine dependence, cigarettes, uncomplicated 17/79/3903   Lymphadenopathy, axillary 01/15/2015   Calculus of kidney  12/13/2014   Female genuine stress incontinence 12/13/2014    Past Surgical History:  Procedure Laterality Date   BREAST BIOPSY Left 08/10/2016   [place] core, PASH   BREAST BIOPSY Right 01/17/2020   no clip due to pt condition during biopsy-neg   BREAST BIOPSY Right 02/07/2020   re-biopsy due to prior biopsy/ coil clip/ path pending   COLONOSCOPY WITH PROPOFOL N/A 04/25/2017   Procedure: COLONOSCOPY WITH PROPOFOL;  Surgeon: Lucilla Lame, MD;  Location: [place];  Service: Endoscopy;  Laterality: N/A;   CYSTOSCOPY  11/26/2010   with stent placement   ESOPHAGOGASTRODUODENOSCOPY (EGD) WITH PROPOFOL N/A 04/25/2017   Procedure: ESOPHAGOGASTRODUODENOSCOPY (EGD) WITH PROPOFOL;  Surgeon: Lucilla Lame, MD;  Location: [place];  Service: Endoscopy;  Laterality: N/A;   HEMORRHOID SURGERY     kidney stent     LAPAROSCOPIC APPENDECTOMY N/A 02/02/2017   Procedure: APPENDECTOMY LAPAROSCOPIC;  Surgeon: Clayburn Pert, MD;  Location: [facility];  Service: General;  Laterality: N/A;   LAPAROSCOPIC SIGMOID COLECTOMY N/A 11/01/2016   Procedure: LAPAROSCOPIC SIGMOID COLECTOMY;  Surgeon: Clayburn Pert, MD;  Location: [facility];  Service: General;  Laterality: N/A;   LITHOTRIPSY     X 4   POLYPECTOMY  04/25/2017   Procedure: POLYPECTOMY INTESTINAL;  Surgeon: Lucilla Lame, MD;  Location: [place];  Service: Endoscopy;;   TONSILLECTOMY  TOTAL ABDOMINAL HYSTERECTOMY  08/2013   TOTAL ABDOMINAL HYSTERECTOMY W/ BILATERAL SALPINGOOPHORECTOMY     TUBAL LIGATION     Ureteroscopy Right 11/21/1997   with holmium laser    OB History   No obstetric history on file.      Home Medications    Prior to Admission medications   Medication Sig Start Date End Date Taking? Authorizing Provider  amoxicillin-clavulanate (AUGMENTIN) 875-125 MG tablet Take 1 tablet by mouth every 12 (twelve) hours for 10 days. 12/06/21 12/16/21 Yes Margarette Canada, NP  DULoxetine (CYMBALTA) 60 MG capsule  TAKE 1 CAPSULE(60 MG) BY MOUTH DAILY 10/01/21  Yes Johnson, Megan P, DO  lisinopril (ZESTRIL) 10 MG tablet Take 1.5 tablets (15 mg total) by mouth daily. 07/29/21  Yes Johnson, Megan P, DO  LORazepam (ATIVAN) 0.5 MG tablet Take 1 tablet (0.5 mg total) by mouth 2 (two) times daily as needed for anxiety. 10/31/21  Yes Johnson, Megan P, DO  metoprolol succinate (TOPROL-XL) 100 MG 24 hr tablet TAKE 1 TABLET(100 MG) BY MOUTH DAILY 06/12/21  Yes Johnson, Megan P, DO  sucralfate (CARAFATE) 1 g tablet Take 1 tablet (1 g total) by mouth 4 (four) times daily -  with meals and at bedtime. 10/01/21  Yes Valerie Roys, DO    Family History Family History  Problem Relation Age of Onset   Kidney Stones Mother    Ovarian cancer Mother    Colon cancer Mother    Lung cancer Father    Hyperlipidemia Sister    Hypertension Sister    Hypertension Sister    Hyperlipidemia Sister    Alzheimer's disease Maternal Grandmother    Cancer Maternal Grandfather        Liver   Alzheimer's disease Paternal Grandfather    Breast cancer Neg Hx     Social History Social History   Tobacco Use   Smoking status: Every Day    Packs/day: 0.50    Types: Cigarettes   Smokeless tobacco: Never  Vaping Use   Vaping Use: Never used  Substance Use Topics   Alcohol use: Yes    Alcohol/week: 0.0 standard drinks of alcohol    Comment: occasional   Drug use: No     Allergies   Wellbutrin [bupropion] and Urocit-k [potassium citrate]   Review of Systems Review of Systems  Constitutional:  Negative for fever.  Musculoskeletal:  Positive for myalgias. Negative for arthralgias and joint swelling.  Skin:  Positive for color change. Negative for wound.  Hematological: Negative.   Psychiatric/Behavioral: Negative.       Physical Exam Triage Vital Signs ED Triage Vitals  Enc Vitals Group     BP 12/06/21 1304 (!) 135/95     Pulse Rate 12/06/21 1304 71     Resp 12/06/21 1304 18     Temp 12/06/21 1304 98.1 F (36.7  C)     Temp Source 12/06/21 1304 Oral     SpO2 12/06/21 1304 99 %     Weight 12/06/21 1302 138 lb (62.6 kg)     Height 12/06/21 1302 '5\' 6"'$  (1.676 m)     Head Circumference --      Peak Flow --      Pain Score 12/06/21 1302 5     Pain Loc --      Pain Edu? --      Excl. in [place]? --    No data found.  Updated Vital Signs BP (!) 135/95 (BP Location: Left Arm)   Pulse  71   Temp 98.1 F (36.7 C) (Oral)   Resp 18   Ht '5\' 6"'$  (1.676 m)   Wt 138 lb (62.6 kg)   SpO2 99%   BMI 22.27 kg/m   Visual Acuity Right Eye Distance:   Left Eye Distance:   Bilateral Distance:    Right Eye Near:   Left Eye Near:    Bilateral Near:     Physical Exam Vitals and nursing note reviewed.  Constitutional:      Appearance: Normal appearance. She is not ill-appearing.  HENT:     Head: Normocephalic and atraumatic.  Musculoskeletal:        General: Swelling and tenderness present. No deformity or signs of injury. Normal range of motion.  Skin:    General: Skin is warm and dry.     Capillary Refill: Capillary refill takes less than 2 seconds.     Findings: Erythema present.  Neurological:     General: No focal deficit present.     Mental Status: She is alert and oriented to person, place, and time.  Psychiatric:        Mood and Affect: Mood normal.        Behavior: Behavior normal.        Thought Content: Thought content normal.        Judgment: Judgment normal.      UC Treatments / Results  Labs (all labs ordered are listed, but only abnormal results are displayed) Labs Reviewed - No data to display  EKG   Radiology No results found.  Procedures Procedures (including critical care time)  Medications Ordered in UC Medications - No data to display  Initial Impression / Assessment and Plan / UC Course  I have reviewed the triage vital signs and the nursing notes.  Pertinent labs & imaging results that were available during my care of the patient were reviewed by me and  considered in my medical decision making (see chart for details).   Patient is a pleasant, nontoxic-appearing 56 year old female here for evaluation of redness, swelling, drainage, throbbing, and itching to the lateral aspect of her right great toe that began 4 days ago.  The toe was in normal anatomical alignment and there is no tenderness with palpation of the IP joint, distal phalanx, proximal phalanx, or MTP joint.  The area of erythema is tender to touch and warm but there is no fluctuance or induration.  You can see an open tract where the wound has been draining.    The patient's exam is consistent with a paronychia.  I will place her on Augmentin twice daily for 10 days for treatment of the paronychia and I have encouraged her to soak her foot in warm water and Epsom salts 2-3 times a day to encourage further drainage of any pus that may collect.  I have also informed her that if the swelling returns or the throbbing returns that she should come back in for reevaluation as we may need to perform an I&D at that time.  Patient verbalized understanding of same.   Final Clinical Impressions(s) / UC Diagnoses   Final diagnoses:  Paronychia of great toe of right foot     Discharge Instructions      Take the Augmentin twice daily with food for 10 days.  Soak your Toe in warm water and Epsom salts 2-3 times a day to help facilitate drainage.  You can use over-the-counter Tylenol and ibuprofen according to the package instructions as needed  for pain.  Return for reevaluation if you develop any increased redness, swelling, drainage, or red streaks going up your finger, or fever.      ED Prescriptions     Medication Sig Dispense Auth. Provider   amoxicillin-clavulanate (AUGMENTIN) 875-125 MG tablet Take 1 tablet by mouth every 12 (twelve) hours for 10 days. 20 tablet Margarette Canada, NP      PDMP not reviewed this encounter.   Margarette Canada, NP 12/06/21 1338

## 2021-12-06 NOTE — Discharge Instructions (Signed)
Take the Augmentin twice daily with food for 10 days.  Soak your Toe in warm water and Epsom salts 2-3 times a day to help facilitate drainage.  You can use over-the-counter Tylenol and ibuprofen according to the package instructions as needed for pain.  Return for reevaluation if you develop any increased redness, swelling, drainage, or red streaks going up your finger, or fever.

## 2021-12-06 NOTE — ED Triage Notes (Signed)
Pt c/o right big toe swelling and itching.  Pt right 1st toe is swollen and discolored with green and yellow drainage, itching, and tenderness.  Pt states that she received a pedicure and the nail was cut too short and she had pulled off a piece of nail.

## 2021-12-17 ENCOUNTER — Other Ambulatory Visit: Payer: Self-pay | Admitting: Family Medicine

## 2021-12-18 NOTE — Telephone Encounter (Signed)
Requested medication (s) are due for refill today: yes  Requested medication (s) are on the active medication list: yes  Last refill:  10/31/21  Future visit scheduled:yes  Notes to clinic:  Unable to refill per protocol, cannot delegate.      Requested Prescriptions  Pending Prescriptions Disp Refills   LORazepam (ATIVAN) 0.5 MG tablet [Pharmacy Med Name: LORAZEPAM 0.5 MG TAB] 45 tablet     Sig: TAKE 1 TABLET BY MOUTH TWICE DAILY AS NEEDED FOR ANXIETY     Not Delegated - Psychiatry: Anxiolytics/Hypnotics 2 Failed - 12/17/2021 10:41 AM      Failed - This refill cannot be delegated      Failed - Urine Drug Screen completed in last 360 days      Passed - Patient is not pregnant      Passed - Valid encounter within last 6 months    Recent Outpatient Visits           3 weeks ago Grade II hemorrhoids   [place], Reedy T, NP   2 months ago Depression, recurrent ([place])   [place], Megan P, DO   2 months ago Depression, recurrent ([place])   [place]mond, Megan P, DO   3 months ago Diarrhea, unspecified type   [employer], Dani Gobble, PA-C   4 months ago Essential hypertension   [place], Cisco, DO       Future Appointments             In 2 weeks Wynetta Emery, Barb Merino, DO [employer], [facility]

## 2022-01-01 ENCOUNTER — Ambulatory Visit: Payer: BC Managed Care – PPO | Admitting: Family Medicine

## 2022-01-07 ENCOUNTER — Encounter: Payer: Self-pay | Admitting: Family Medicine

## 2022-01-14 ENCOUNTER — Ambulatory Visit: Payer: BC Managed Care – PPO | Admitting: Family Medicine

## 2022-01-14 ENCOUNTER — Encounter: Payer: Self-pay | Admitting: Family Medicine

## 2022-01-14 VITALS — BP 144/97 | HR 93 | Temp 98.7°F | Ht 65.0 in | Wt 142.3 lb

## 2022-01-14 DIAGNOSIS — F339 Major depressive disorder, recurrent, unspecified: Secondary | ICD-10-CM

## 2022-01-14 DIAGNOSIS — F419 Anxiety disorder, unspecified: Secondary | ICD-10-CM

## 2022-01-14 DIAGNOSIS — Z0000 Encounter for general adult medical examination without abnormal findings: Secondary | ICD-10-CM

## 2022-01-14 DIAGNOSIS — I10 Essential (primary) hypertension: Secondary | ICD-10-CM

## 2022-01-14 DIAGNOSIS — Z1231 Encounter for screening mammogram for malignant neoplasm of breast: Secondary | ICD-10-CM

## 2022-01-14 LAB — URINALYSIS, ROUTINE W REFLEX MICROSCOPIC
Bilirubin, UA: NEGATIVE
Glucose, UA: NEGATIVE
Ketones, UA: NEGATIVE
Leukocytes,UA: NEGATIVE
Nitrite, UA: NEGATIVE
Specific Gravity, UA: >1.03 — ABNORMAL HIGH (ref 1.005–1.030)
Urobilinogen, Ur: 0.2 mg/dL (ref 0.2–1.0)
pH, UA: 5.5 (ref 5.0–7.5)

## 2022-01-14 LAB — MICROSCOPIC EXAMINATION: Bacteria, UA: NONE SEEN

## 2022-01-14 LAB — MICROALBUMIN, URINE WAIVED
Creatinine, Urine Waived: 300 mg/dL (ref 10–300)
Microalb, Ur Waived: 150 mg/L — ABNORMAL HIGH (ref 0–19)

## 2022-01-14 MED ORDER — METOPROLOL SUCCINATE ER 100 MG PO TB24: ORAL_TABLET | ORAL | 1 refills | Status: DC

## 2022-01-14 MED ORDER — LORAZEPAM 0.5 MG PO TABS: 0.5000 mg | ORAL_TABLET | Freq: Two times a day (BID) | ORAL | 2 refills | Status: DC | PRN

## 2022-01-14 MED ORDER — LISINOPRIL 20 MG PO TABS: 20.0000 mg | ORAL_TABLET | Freq: Every day | ORAL | 0 refills | Status: DC

## 2022-01-14 MED ORDER — DULOXETINE HCL 60 MG PO CPEP: ORAL_CAPSULE | ORAL | 1 refills | Status: DC

## 2022-01-14 NOTE — Assessment & Plan Note (Signed)
Under good control on current regimen. Continue current regimen. Continue to monitor. Call with any concerns. Refills given. Follow up 3 months.  

## 2022-01-14 NOTE — Progress Notes (Signed)
BP (!) 144/97   Pulse 93   Temp 98.7 F (37.1 C)   Ht '5\' 5"'$  (1.651 m)   Wt 142 lb 4.8 oz (64.5 kg)   SpO2 99%   BMI 23.68 kg/m    Subjective:    Patient ID: Morgan Cisneros, female    DOB: 1965/09/11, 56 y.o.   MRN: [id number]  HPI: Morgan Cisneros is a 56 y.o. female presenting on 01/14/2022 for comprehensive medical examination. Current medical complaints include:  HYPERTENSION  Hypertension status: worse  Satisfied with current treatment? no Duration of hypertension: chronic BP monitoring frequency:  a few times a week BP range: 140s-150s BP medication side effects:  no Medication compliance: excellent compliance Previous BP meds:lisinopril, metoprolol Aspirin: no Recurrent headaches: no Visual changes: no Palpitations: yes Dyspnea: no Chest pain: no Lower extremity edema: no Dizzy/lightheaded: no  DEPRESSION Mood status: stable Satisfied with current treatment?: yes Symptom severity: mild  Duration of current treatment : chronic Side effects: no Medication compliance: excellent compliance Psychotherapy/counseling: yes  Previous psychiatric medications: cymbalta Depressed mood: no Anxious mood: yes Anhedonia: no Significant weight loss or gain: no Insomnia: no Fatigue: yes Feelings of worthlessness or guilt: no Impaired concentration/indecisiveness: no Suicidal ideations: no Hopelessness: no Crying spells: yes    01/14/2022    3:43 PM 11/27/2021   11:35 AM 10/19/2021    9:00 AM 10/01/2021    4:26 PM 09/07/2021    8:33 AM  Depression screen PHQ 2/9  Decreased Interest '2 1 3 2 2  '$ Down, Depressed, Hopeless '2 2 3 2 3  '$ PHQ - 2 Score '4 3 6 4 5  '$ Altered sleeping '3 2 2 2 3  '$ Tired, decreased energy '2 2 2 2 2  '$ Change in appetite 2 0 0 2 3  Feeling bad or failure about yourself  '2 1 3 2 2  '$ Trouble concentrating 2 0 '3 2 2  '$ Moving slowly or fidgety/restless 0 0 0 0 0  Suicidal thoughts 0 0 0 0 0  PHQ-9 Score '15 8 16 14 17  '$ Difficult doing work/chores    Somewhat difficult Somewhat difficult    She currently lives with: alone Menopausal Symptoms: no  Depression Screen done today and results listed below:     01/14/2022    3:43 PM 11/27/2021   11:35 AM 10/19/2021    9:00 AM 10/01/2021    4:26 PM 09/07/2021    8:33 AM  Depression screen PHQ 2/9  Decreased Interest '2 1 3 2 2  '$ Down, Depressed, Hopeless '2 2 3 2 3  '$ PHQ - 2 Score '4 3 6 4 5  '$ Altered sleeping '3 2 2 2 3  '$ Tired, decreased energy '2 2 2 2 2  '$ Change in appetite 2 0 0 2 3  Feeling bad or failure about yourself  '2 1 3 2 2  '$ Trouble concentrating 2 0 '3 2 2  '$ Moving slowly or fidgety/restless 0 0 0 0 0  Suicidal thoughts 0 0 0 0 0  PHQ-9 Score '15 8 16 14 17  '$ Difficult doing work/chores   Somewhat difficult Somewhat difficult    Past Medical History:  Past Medical History:  Diagnosis Date   Abdominal pain    Acute gastritis without hemorrhage    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   Insomnia    LBP (low back pain) 12/13/2014   Ovarian cyst    Renal calculus, right    Renal calculus, right    Thoracic back pain    Right sided    Surgical History:  Past Surgical History:  Procedure Laterality Date   BREAST BIOPSY Left 08/10/2016   [place] core, PASH   BREAST BIOPSY Right 01/17/2020   no clip due to pt condition during biopsy-neg   BREAST BIOPSY Right 02/07/2020   re-biopsy due to prior biopsy/ coil clip/ path pending   COLONOSCOPY WITH PROPOFOL N/A 04/25/2017   Procedure: COLONOSCOPY WITH PROPOFOL;  Surgeon: Lucilla Lame, MD;  Location: [place] [facility];  Service: Endoscopy;  Laterality: N/A;   CYSTOSCOPY  11/26/2010   with stent placement   ESOPHAGOGASTRODUODENOSCOPY (EGD) WITH PROPOFOL  N/A 04/25/2017   Procedure: ESOPHAGOGASTRODUODENOSCOPY (EGD) WITH PROPOFOL;  Surgeon: Lucilla Lame, MD;  Location: [facility];  Service: Endoscopy;  Laterality: N/A;   HEMORRHOID SURGERY     kidney stent     LAPAROSCOPIC APPENDECTOMY N/A 02/02/2017   Procedure: APPENDECTOMY LAPAROSCOPIC;  Surgeon: Clayburn Pert, MD;  Location: [facility];  Service: General;  Laterality: N/A;   LAPAROSCOPIC SIGMOID COLECTOMY N/A 11/01/2016   Procedure: LAPAROSCOPIC SIGMOID COLECTOMY;  Surgeon: Clayburn Pert, MD;  Location: [facility];  Service: General;  Laterality: N/A;   LITHOTRIPSY     X 4   POLYPECTOMY  04/25/2017   Procedure: POLYPECTOMY INTESTINAL;  Surgeon: Lucilla Lame, MD;  Location: [place];  Service: Endoscopy;;   TONSILLECTOMY     TOTAL ABDOMINAL HYSTERECTOMY  08/2013   TOTAL ABDOMINAL HYSTERECTOMY W/ BILATERAL SALPINGOOPHORECTOMY     TUBAL LIGATION     Ureteroscopy Right 11/21/1997   with holmium laser    Medications:  Current Outpatient Medications on File Prior to Visit  Medication Sig   sucralfate (CARAFATE) 1 g tablet Take 1 tablet (1 g total) by mouth 4 (four) times daily -  with meals and at bedtime.   No current facility-administered medications on file prior to visit.    Allergies:  Allergies  Allergen Reactions   Wellbutrin [Bupropion] Other (See Comments)    irritiability   Urocit-K [Potassium Citrate] Nausea Only    Social History:  Social History   Socioeconomic History   Marital status: Widowed    Spouse name: Not on file   Number of children: Not on file   Years of education: Not on file   Highest education level: Not on file  Occupational History   Not on file  Tobacco Use   Smoking status: Every Day    Packs/day: 0.50    Types: Cigarettes   Smokeless tobacco: Never  Vaping Use   Vaping Use: Never used  Substance and Sexual Activity   Alcohol use: Yes    Alcohol/week: 0.0 standard drinks of alcohol    Comment: occasional    Drug use: No   Sexual activity: Not Currently    Birth control/protection: Surgical  Other Topics Concern   Not on file  Social History Narrative   Not on file   Social Determinants of Health   Financial Resource Strain: Not on file  Food Insecurity: Not on file  Transportation Needs: Not on file  Physical Activity: Not on file  Stress: Not on file  Social Connections: Not on file  Intimate Partner Violence: Not on file  Social History   Tobacco Use  Smoking Status Every Day   Packs/day: 0.50   Types: Cigarettes  Smokeless Tobacco Never   Social History   Substance and Sexual Activity  Alcohol Use Yes   Alcohol/week: 0.0 standard drinks of alcohol   Comment: occasional    Family History:  Family History  Problem Relation Age of Onset   Kidney Stones Mother    Ovarian cancer Mother    Colon cancer Mother    Lung cancer Father    Hyperlipidemia Sister    Hypertension Sister    Hypertension Sister    Hyperlipidemia Sister    Alzheimer's disease Maternal Grandmother    Cancer Maternal Grandfather        Liver   Alzheimer's disease Paternal Grandfather    Breast cancer Neg Hx     Past medical history, surgical history, medications, allergies, family history and social history reviewed with patient today and changes made to appropriate areas of the chart.   Review of Systems  Constitutional: Negative.   HENT: Negative.    Eyes: Negative.   Respiratory: Negative.    Cardiovascular:  Positive for palpitations. Negative for orthopnea, claudication, leg swelling and PND.  Gastrointestinal: Negative.   Genitourinary: Negative.   Musculoskeletal: Negative.   Skin: Negative.   Neurological: Negative.   Endo/Heme/Allergies: Negative.   Psychiatric/Behavioral: Negative.     All other ROS negative except what is listed above and in the HPI.      Objective:    BP (!) 144/97   Pulse 93   Temp 98.7 F (37.1 C)   Ht '5\' 5"'$  (1.651 m)   Wt 142 lb 4.8 oz (64.5  kg)   SpO2 99%   BMI 23.68 kg/m   Wt Readings from Last 3 Encounters:  01/14/22 142 lb 4.8 oz (64.5 kg)  12/06/21 138 lb (62.6 kg)  11/27/21 139 lb 14.4 oz (63.5 kg)    Physical Exam Vitals and nursing note reviewed.  Constitutional:      General: She is not in acute distress.    Appearance: Normal appearance. She is normal weight. She is not ill-appearing, toxic-appearing or diaphoretic.  HENT:     Head: Normocephalic and atraumatic.     Right Ear: Tympanic membrane, ear canal and external ear normal. There is no impacted cerumen.     Left Ear: Tympanic membrane, ear canal and external ear normal. There is no impacted cerumen.     Nose: Nose normal. No congestion or rhinorrhea.     Mouth/Throat:     Mouth: Mucous membranes are moist.     Pharynx: Oropharynx is clear. No oropharyngeal exudate or posterior oropharyngeal erythema.  Eyes:     General: No scleral icterus.       Right eye: No discharge.        Left eye: No discharge.     Extraocular Movements: Extraocular movements intact.     Conjunctiva/sclera: Conjunctivae normal.     Pupils: Pupils are equal, round, and reactive to light.  Neck:     Vascular: No carotid bruit.  Cardiovascular:     Rate and Rhythm: Normal rate and regular rhythm.     Pulses: Normal pulses.     Heart sounds: No murmur heard.    No friction rub. No gallop.  Pulmonary:     Effort: Pulmonary effort is normal. No respiratory distress.     Breath sounds: Normal breath sounds. No stridor. No wheezing, rhonchi or rales.  Chest:  Chest wall: No tenderness.  Abdominal:     General: Abdomen is flat. Bowel sounds are normal. There is no distension.     Palpations: Abdomen is soft. There is no mass.     Tenderness: There is no abdominal tenderness. There is no right CVA tenderness, left CVA tenderness, guarding or rebound.     Hernia: No hernia is present.  Genitourinary:    Comments: Breast and pelvic exams deferred with shared decision  making Musculoskeletal:        General: No swelling, tenderness, deformity or signs of injury.     Cervical back: Normal range of motion and neck supple. No rigidity. No muscular tenderness.     Right lower leg: No edema.     Left lower leg: No edema.  Lymphadenopathy:     Cervical: No cervical adenopathy.  Skin:    General: Skin is warm and dry.     Capillary Refill: Capillary refill takes less than 2 seconds.     Coloration: Skin is not jaundiced or pale.     Findings: No bruising, erythema, lesion or rash.  Neurological:     General: No focal deficit present.     Mental Status: She is alert and oriented to person, place, and time. Mental status is at baseline.     Cranial Nerves: No cranial nerve deficit.     Sensory: No sensory deficit.     Motor: No weakness.     Coordination: Coordination normal.     Gait: Gait normal.     Deep Tendon Reflexes: Reflexes normal.  Psychiatric:        Mood and Affect: Mood normal.        Behavior: Behavior normal.        Thought Content: Thought content normal.        Judgment: Judgment normal.     Results for orders placed or performed in visit on 01/14/22  Microscopic Examination   Urine  Result Value Ref Range   WBC, UA 0-5 0 - 5 /hpf   RBC, Urine 0-2 0 - 2 /hpf   Epithelial Cells (non renal) 0-10 0 - 10 /hpf   Casts Present (A) None seen /lpf   Cast Type Hyaline casts N/A   Mucus, UA Present (A) Not Estab.   Bacteria, UA None seen None seen/Few  Urinalysis, Routine w reflex microscopic  Result Value Ref Range   Specific Gravity, UA >1.030 (H) 1.005 - 1.030   pH, UA 5.5 5.0 - 7.5   Color, UA Yellow Yellow   Appearance Ur Clear Clear   Leukocytes,UA Negative Negative   Protein,UA 2+ (A) Negative/Trace   Glucose, UA Negative Negative   Ketones, UA Negative Negative   RBC, UA 2+ (A) Negative   Bilirubin, UA Negative Negative   Urobilinogen, Ur 0.2 0.2 - 1.0 mg/dL   Nitrite, UA Negative Negative   Microscopic Examination See  below:   Microalbumin, Urine Waived  Result Value Ref Range   Microalb, Ur Waived 150 (H) 0 - 19 mg/L   Creatinine, Urine Waived 300 10 - 300 mg/dL   Microalb/Creat Ratio 30-300 (H) <30 mg/g      Assessment & Plan:   Problem List Items Addressed This Visit       Cardiovascular and Mediastinum   Essential hypertension    Will increase her lisinopril to '30mg'$  and recheck 3 months. Call with any concerns. Refills given.       Relevant Medications   lisinopril (ZESTRIL) 20  MG tablet   metoprolol succinate (TOPROL-XL) 100 MG 24 hr tablet   Other Relevant Orders   Comprehensive metabolic panel   Microalbumin, Urine Waived (Completed)     Other   Anxiety    Under good control on current regimen. Continue current regimen. Continue to monitor. Call with any concerns. Refills given. Follow up 3 months.        Relevant Medications   LORazepam (ATIVAN) 0.5 MG tablet   DULoxetine (CYMBALTA) 60 MG capsule   Depression, recurrent (HCC)    Under good control on current regimen. Continue current regimen. Continue to monitor. Call with any concerns. Refills given. Follow up 3 months.        Relevant Medications   LORazepam (ATIVAN) 0.5 MG tablet   DULoxetine (CYMBALTA) 60 MG capsule   Other Visit Diagnoses     Routine general medical examination at a health care facility    -  Primary   Vaccines up to date. Screening labs checked today. Pap N/A. Mammo ordered. Colonoscopy up to date. Continue diet and exercise. Call with any concerns.    Relevant Orders   CBC with Differential/Platelet   Comprehensive metabolic panel   Lipid Panel w/o Chol/HDL Ratio   Urinalysis, Routine w reflex microscopic (Completed)   TSH   Encounter for screening mammogram for malignant neoplasm of breast       Mammo ordered today.   Relevant Orders   MM 3D SCREEN BREAST BILATERAL        Follow up plan: Return in about 3 months (around 04/16/2022).   LABORATORY TESTING:  - Pap smear: not  applicable  IMMUNIZATIONS:   - Tdap: Tetanus vaccination status reviewed: last tetanus booster within 10 years. - Influenza: Refused - Pneumovax: Up to date - Prevnar: Not applicable - COVID: Up to date - HPV: Not applicable - Shingrix vaccine: Up to date  SCREENING: -Mammogram: Ordered today  - Colonoscopy: Up to date    PATIENT COUNSELING:   Advised to take 1 mg of folate supplement per day if capable of pregnancy.   Sexuality: Discussed sexually transmitted diseases, partner selection, use of condoms, avoidance of unintended pregnancy  and contraceptive alternatives.   Advised to avoid cigarette smoking.  I discussed with the patient that most people either abstain from alcohol or drink within safe limits (<=14/week and <=4 drinks/occasion for males, <=7/weeks and <= 3 drinks/occasion for females) and that the risk for alcohol disorders and other health effects rises proportionally with the number of drinks per week and how often a drinker exceeds daily limits.  Discussed cessation/primary prevention of drug use and availability of treatment for abuse.   Diet: Encouraged to adjust caloric intake to maintain  or achieve ideal body weight, to reduce intake of dietary saturated fat and total fat, to limit sodium intake by avoiding high sodium foods and not adding table salt, and to maintain adequate dietary potassium and calcium preferably from fresh fruits, vegetables, and low-fat dairy products.    stressed the importance of regular exercise  Injury prevention: Discussed safety belts, safety helmets, smoke detector, smoking near bedding or upholstery.   Dental health: Discussed importance of regular tooth brushing, flossing, and dental visits.    NEXT PREVENTATIVE PHYSICAL DUE IN 1 YEAR. Return in about 3 months (around 04/16/2022).

## 2022-01-14 NOTE — Assessment & Plan Note (Signed)
Will increase her lisinopril to '30mg'$  and recheck 3 months. Call with any concerns. Refills given.

## 2022-01-14 NOTE — Patient Instructions (Signed)
Please call to schedule your mammogram and/or bone density: [facility]  Address: [address], [place], [place] [address] Phone: [phone]   Imaging at [facility] [place] [address] [place],  [place]  [address] Phone: [phone]   

## 2022-01-15 ENCOUNTER — Encounter: Payer: Self-pay | Admitting: Family Medicine

## 2022-01-15 LAB — COMPREHENSIVE METABOLIC PANEL
ALT: 31 IU/L (ref 0–32)
AST: 33 IU/L (ref 0–40)
Albumin/Globulin Ratio: 2 (ref 1.2–2.2)
Albumin: 4.5 g/dL (ref 3.8–4.9)
Alkaline Phosphatase: 142 IU/L — ABNORMAL HIGH (ref 44–121)
BUN/Creatinine Ratio: 17 (ref 9–23)
BUN: 14 mg/dL (ref 6–24)
Bilirubin Total: 0.3 mg/dL (ref 0.0–1.2)
CO2: 19 mmol/L — ABNORMAL LOW (ref 20–29)
Calcium: 9.7 mg/dL (ref 8.7–10.2)
Chloride: 104 mmol/L (ref 96–106)
Creatinine, Ser: 0.81 mg/dL (ref 0.57–1.00)
Globulin, Total: 2.3 g/dL (ref 1.5–4.5)
Glucose: 158 mg/dL — ABNORMAL HIGH (ref 70–99)
Potassium: 3.9 mmol/L (ref 3.5–5.2)
Sodium: 142 mmol/L (ref 134–144)
Total Protein: 6.8 g/dL (ref 6.0–8.5)
eGFR: 86 mL/min/{1.73_m2} (ref >59)

## 2022-01-15 LAB — CBC WITH DIFFERENTIAL/PLATELET
Basophils Absolute: 0 10*3/uL (ref 0.0–0.2)
Basos: 0 %
EOS (ABSOLUTE): 0.1 10*3/uL (ref 0.0–0.4)
Eos: 3 %
Hematocrit: 41.8 % (ref 34.0–46.6)
Hemoglobin: 14.1 g/dL (ref 11.1–15.9)
Immature Grans (Abs): 0 10*3/uL (ref 0.0–0.1)
Immature Granulocytes: 0 %
Lymphocytes Absolute: 2.1 10*3/uL (ref 0.7–3.1)
Lymphs: 41 %
MCH: 32.9 pg (ref 26.6–33.0)
MCHC: 33.7 g/dL (ref 31.5–35.7)
MCV: 98 fL — ABNORMAL HIGH (ref 79–97)
Monocytes Absolute: 0.3 10*3/uL (ref 0.1–0.9)
Monocytes: 6 %
Neutrophils Absolute: 2.6 10*3/uL (ref 1.4–7.0)
Neutrophils: 50 %
Platelets: 330 10*3/uL (ref 150–450)
RBC: 4.28 x10E6/uL (ref 3.77–5.28)
RDW: 15.4 % (ref 11.7–15.4)
WBC: 5.2 10*3/uL (ref 3.4–10.8)

## 2022-01-15 LAB — LIPID PANEL W/O CHOL/HDL RATIO
Cholesterol, Total: 236 mg/dL — ABNORMAL HIGH (ref 100–199)
HDL: 52 mg/dL (ref >39)
LDL Chol Calc (NIH): 114 mg/dL — ABNORMAL HIGH (ref 0–99)
Triglycerides: 403 mg/dL — ABNORMAL HIGH (ref 0–149)
VLDL Cholesterol Cal: 70 mg/dL — ABNORMAL HIGH (ref 5–40)

## 2022-01-15 LAB — TSH: TSH: 1.6 u[IU]/mL (ref 0.450–4.500)

## 2022-01-22 ENCOUNTER — Other Ambulatory Visit: Payer: Self-pay | Admitting: Family Medicine

## 2022-01-22 DIAGNOSIS — E785 Hyperlipidemia, unspecified: Secondary | ICD-10-CM

## 2022-01-25 ENCOUNTER — Ambulatory Visit (INDEPENDENT_AMBULATORY_CARE_PROVIDER_SITE_OTHER): Payer: BC Managed Care – PPO

## 2022-01-25 ENCOUNTER — Other Ambulatory Visit: Payer: BC Managed Care – PPO

## 2022-01-25 DIAGNOSIS — E785 Hyperlipidemia, unspecified: Secondary | ICD-10-CM

## 2022-01-25 DIAGNOSIS — Z23 Encounter for immunization: Secondary | ICD-10-CM

## 2022-01-26 LAB — LIPID PANEL W/O CHOL/HDL RATIO
Cholesterol, Total: 263 mg/dL — ABNORMAL HIGH (ref 100–199)
HDL: 51 mg/dL (ref >39)
LDL Chol Calc (NIH): 140 mg/dL — ABNORMAL HIGH (ref 0–99)
Triglycerides: 393 mg/dL — ABNORMAL HIGH (ref 0–149)
VLDL Cholesterol Cal: 72 mg/dL — ABNORMAL HIGH (ref 5–40)

## 2022-01-27 MED ORDER — ROSUVASTATIN CALCIUM 5 MG PO TABS: 5.0000 mg | ORAL_TABLET | Freq: Every day | ORAL | 0 refills | Status: DC

## 2022-02-09 ENCOUNTER — Telehealth (INDEPENDENT_AMBULATORY_CARE_PROVIDER_SITE_OTHER): Payer: BC Managed Care – PPO | Admitting: Family Medicine

## 2022-02-09 ENCOUNTER — Ambulatory Visit: Payer: BC Managed Care – PPO

## 2022-02-09 ENCOUNTER — Encounter: Payer: Self-pay | Admitting: Family Medicine

## 2022-02-09 DIAGNOSIS — J069 Acute upper respiratory infection, unspecified: Secondary | ICD-10-CM | POA: Diagnosis not present

## 2022-02-09 MED ORDER — ALBUTEROL SULFATE HFA 108 (90 BASE) MCG/ACT IN AERS: 2.0000 | INHALATION_SPRAY | Freq: Four times a day (QID) | RESPIRATORY_TRACT | 0 refills | Status: DC | PRN

## 2022-02-09 MED ORDER — HYDROCOD POLI-CHLORPHE POLI ER 10-8 MG/5ML PO SUER: 5.0000 mL | Freq: Two times a day (BID) | ORAL | 0 refills | Status: DC | PRN

## 2022-02-09 MED ORDER — BENZONATATE 200 MG PO CAPS: 200.0000 mg | ORAL_CAPSULE | Freq: Two times a day (BID) | ORAL | 0 refills | Status: DC | PRN

## 2022-02-09 NOTE — Progress Notes (Signed)
There were no vitals taken for this visit.   Subjective:    Patient ID: Morgan Cisneros, female    DOB: 04-08-1965, 56 y.o.   MRN: [id number]  HPI: Morgan Cisneros is a 56 y.o. female  Chief Complaint  Patient presents with   Sore Throat     Onset 2 days ago, denies fever.    Cough    Onset 2 days ago. Pt taking OTC mucinex. No covid testing at home per patient.    UPPER RESPIRATORY TRACT INFECTION Duration: 3 days Worst symptom: congestion, headache Fever: no Cough: yes Shortness of breath: no Wheezing: yes Chest pain: yes, with cough Chest tightness: yes Chest congestion: yes Nasal congestion: yes Runny nose: no Post nasal drip: no Sneezing: no Sore throat: no Swollen glands: no Sinus pressure: yes Headache: no Face pain: no Toothache: no Ear pain: no  Ear pressure: no  Eyes red/itching:no Eye drainage/crusting: no  Vomiting: no Rash: no Fatigue: yes Sick contacts: yes Strep contacts: no  Context: worse Recurrent sinusitis: no Relief with OTC cold/cough medications: no  Treatments attempted: mucinex   Relevant past medical, surgical, family and social history reviewed and updated as indicated. Interim medical history since our last visit reviewed. Allergies and medications reviewed and updated.  Review of Systems  Constitutional: Negative.   HENT:  Positive for congestion and sinus pain. Negative for dental problem, drooling, ear discharge, ear pain, facial swelling, hearing loss, mouth sores, nosebleeds, postnasal drip, rhinorrhea, sinus pressure, sneezing, sore throat, tinnitus, trouble swallowing and voice change.   Respiratory:  Positive for cough, shortness of breath and wheezing. Negative for apnea, choking, chest tightness and stridor.   Cardiovascular: Negative.   Gastrointestinal: Negative.   Neurological: Negative.   Psychiatric/Behavioral: Negative.      Per HPI unless specifically indicated above     Objective:    There were  no vitals taken for this visit.  Wt Readings from Last 3 Encounters:  01/14/22 142 lb 4.8 oz (64.5 kg)  12/06/21 138 lb (62.6 kg)  11/27/21 139 lb 14.4 oz (63.5 kg)    Physical Exam Vitals and nursing note reviewed.  Constitutional:      General: She is not in acute distress.    Appearance: Normal appearance. She is not ill-appearing, toxic-appearing or diaphoretic.  HENT:     Head: Normocephalic and atraumatic.     Right Ear: External ear normal.     Left Ear: External ear normal.     Nose: Nose normal.     Mouth/Throat:     Mouth: Mucous membranes are moist.     Pharynx: Oropharynx is clear.  Eyes:     General: No scleral icterus.       Right eye: No discharge.        Left eye: No discharge.     Conjunctiva/sclera: Conjunctivae normal.     Pupils: Pupils are equal, round, and reactive to light.  Pulmonary:     Effort: Pulmonary effort is normal. No respiratory distress.     Comments: Speaking in full sentences Musculoskeletal:        General: Normal range of motion.     Cervical back: Normal range of motion.  Skin:    Coloration: Skin is not jaundiced or pale.     Findings: No bruising, erythema, lesion or rash.  Neurological:     Mental Status: She is alert and oriented to person, place, and time. Mental status is at baseline.  Psychiatric:  Mood and Affect: Mood normal.        Behavior: Behavior normal.        Thought Content: Thought content normal.        Judgment: Judgment normal.     Results for orders placed or performed in visit on 01/25/22  Lipid Panel w/o Chol/HDL Ratio  Result Value Ref Range   Cholesterol, Total 263 (H) 100 - 199 mg/dL   Triglycerides 393 (H) 0 - 149 mg/dL   HDL 51 >39 mg/dL   VLDL Cholesterol Cal 72 (H) 5 - 40 mg/dL   LDL Chol Calc (NIH) 140 (H) 0 - 99 mg/dL      Assessment & Plan:   Problem List Items Addressed This Visit   None Visit Diagnoses     Upper respiratory tract infection, unspecified type    -  Primary    Will treat symptomatically with tussionex, tessalon and albuterol. All her grandkids have RSV so will avoid swabbing at this time. Call with any concerns.        Follow up plan: Return if symptoms worsen or fail to improve.

## 2022-04-12 ENCOUNTER — Ambulatory Visit
Admission: RE | Admit: 2022-04-12 | Discharge: 2022-04-12 | Disposition: A | Discharge status: Home / Self Care | Payer: BC Managed Care – PPO | Source: Ambulatory Visit | Attending: Family Medicine | Admitting: Family Medicine

## 2022-04-12 DIAGNOSIS — Z1231 Encounter for screening mammogram for malignant neoplasm of breast: Secondary | ICD-10-CM | POA: Insufficient documentation

## 2022-04-20 ENCOUNTER — Encounter: Payer: Self-pay | Admitting: Family Medicine

## 2022-04-20 ENCOUNTER — Telehealth (INDEPENDENT_AMBULATORY_CARE_PROVIDER_SITE_OTHER): Payer: BC Managed Care – PPO | Admitting: Family Medicine

## 2022-04-20 ENCOUNTER — Ambulatory Visit: Payer: Self-pay

## 2022-04-20 DIAGNOSIS — U071 COVID-19: Secondary | ICD-10-CM

## 2022-04-20 MED ORDER — HYDROCOD POLI-CHLORPHE POLI ER 10-8 MG/5ML PO SUER: 5.0000 mL | Freq: Two times a day (BID) | ORAL | 0 refills | Status: DC | PRN

## 2022-04-20 MED ORDER — NIRMATRELVIR/RITONAVIR (PAXLOVID)TABLET: 3.0000 | ORAL_TABLET | Freq: Two times a day (BID) | ORAL | 0 refills | Status: AC

## 2022-04-20 NOTE — Patient Instructions (Signed)
It was great to see you!  Our plans for today:  - See below for self-isolation guidelines. You may end your quarantine once you are 10 days from symptom onset and fever free for 24 hours without use of tylenol or ibuprofen. Wear a well-fitting N95 mask if you have to go out and about. - Take the paxlovid as directed. See HotterNames.de for more information about the medication. - Certainly, if you are having difficulties breathing or unable to keep down fluids, go to the Emergency Department.   Take care and seek immediate care sooner if you develop any concerns.   Dr. Ky Barban     Person Under Monitoring Name: Morgan Cisneros  Location: [address] [place] [address]   Infection Prevention Recommendations for Individuals Confirmed to have, or Being Evaluated for, 2019 Novel Coronavirus (COVID-19) Infection Who Receive Care at Home  Individuals who are confirmed to have, or are being evaluated for, COVID-19 should follow the prevention steps below until a healthcare provider or local or state health department says they can return to normal activities.  Stay home except to get medical care You should restrict activities outside your home, except for getting medical care. Do not go to work, school, or public areas, and do not use public transportation or taxis.  Call ahead before visiting your doctor Before your medical appointment, call the healthcare provider and tell them that you have, or are being evaluated for, COVID-19 infection. This will help the healthcare provider's office take steps to keep other people from getting infected. Ask your healthcare provider to call the local or state health department.  Monitor your symptoms Seek prompt medical attention if your illness is worsening (e.g., difficulty breathing). Before going to your medical appointment, call the healthcare provider and tell them that you have, or are being evaluated  for, COVID-19 infection. Ask your healthcare provider to call the local or state health department.  Wear a facemask You should wear a facemask that covers your nose and mouth when you are in the same room with other people and when you visit a healthcare provider. People who live with or visit you should also wear a facemask while they are in the same room with you.  Separate yourself from other people in your home As much as possible, you should stay in a different room from other people in your home. Also, you should use a separate bathroom, if available.  Avoid sharing household items You should not share dishes, drinking glasses, cups, eating utensils, towels, bedding, or other items with other people in your home. After using these items, you should wash them thoroughly with soap and water.  Cover your coughs and sneezes Cover your mouth and nose with a tissue when you cough or sneeze, or you can cough or sneeze into your sleeve. Throw used tissues in a lined trash can, and immediately wash your hands with soap and water for at least 20 seconds or use an alcohol-based hand rub.  Wash your [facility] your hands often and thoroughly with soap and water for at least 20 seconds. You can use an alcohol-based hand sanitizer if soap and water are not available and if your hands are not visibly dirty. Avoid touching your eyes, nose, and mouth with unwashed hands.   Prevention Steps for Caregivers and Household Members of Individuals Confirmed to have, or Being Evaluated for, COVID-19 Infection Being Cared for in the Home  If you live with, or provide care  at home for, a person confirmed to have, or being evaluated for, COVID-19 infection please follow these guidelines to prevent infection:  Follow healthcare provider's instructions Make sure that you understand and can help the patient follow any healthcare provider instructions for all care.  Provide for the patient's basic  needs You should help the patient with basic needs in the home and provide support for getting groceries, prescriptions, and other personal needs.  Monitor the patient's symptoms If they are getting sicker, call his or her medical provider and tell them that the patient has, or is being evaluated for, COVID-19 infection. This will help the healthcare provider's office take steps to keep other people from getting infected. Ask the healthcare provider to call the local or state health department.  Limit the number of people who have contact with the patient If possible, have only one caregiver for the patient. Other household members should stay in another home or place of residence. If this is not possible, they should stay in another room, or be separated from the patient as much as possible. Use a separate bathroom, if available. Restrict visitors who do not have an essential need to be in the home.  Keep older adults, very young children, and other sick people away from the patient Keep older adults, very young children, and those who have compromised immune systems or chronic health conditions away from the patient. This includes people with chronic heart, lung, or kidney conditions, diabetes, and cancer.  Ensure good ventilation Make sure that shared spaces in the home have good air flow, such as from an air conditioner or an opened window, weather permitting.  Wash your hands often Wash your hands often and thoroughly with soap and water for at least 20 seconds. You can use an alcohol based hand sanitizer if soap and water are not available and if your hands are not visibly dirty. Avoid touching your eyes, nose, and mouth with unwashed hands. Use disposable paper towels to dry your hands. If not available, use dedicated cloth towels and replace them when they become wet.  Wear a facemask and gloves Wear a disposable facemask at all times in the room and gloves when you touch or have  contact with the patient's blood, body fluids, and/or secretions or excretions, such as sweat, saliva, sputum, nasal mucus, vomit, urine, or feces.  Ensure the mask fits over your nose and mouth tightly, and do not touch it during use. Throw out disposable facemasks and gloves after using them. Do not reuse. Wash your hands immediately after removing your facemask and gloves. If your personal clothing becomes contaminated, carefully remove clothing and launder. Wash your hands after handling contaminated clothing. Place all used disposable facemasks, gloves, and other waste in a lined container before disposing them with other household waste. Remove gloves and wash your hands immediately after handling these items.  Do not share dishes, glasses, or other household items with the patient Avoid sharing household items. You should not share dishes, drinking glasses, cups, eating utensils, towels, bedding, or other items with a patient who is confirmed to have, or being evaluated for, COVID-19 infection. After the person uses these items, you should wash them thoroughly with soap and water.  Wash laundry thoroughly Immediately remove and wash clothes or bedding that have blood, body fluids, and/or secretions or excretions, such as sweat, saliva, sputum, nasal mucus, vomit, urine, or feces, on them. Wear gloves when handling laundry from the patient. Read and follow directions on  labels of laundry or clothing items and detergent. In general, wash and dry with the warmest temperatures recommended on the label.  Clean all areas the individual has used often Clean all touchable surfaces, such as counters, tabletops, doorknobs, bathroom fixtures, toilets, phones, keyboards, tablets, and bedside tables, every day. Also, clean any surfaces that may have blood, body fluids, and/or secretions or excretions on them. Wear gloves when cleaning surfaces the patient has come in contact with. Use a diluted bleach  solution (e.g., dilute bleach with 1 part bleach and 10 parts water) or a household disinfectant with a label that says EPA-registered for coronaviruses. To make a bleach solution at home, add 1 tablespoon of bleach to 1 quart (4 cups) of water. For a larger supply, add  cup of bleach to 1 gallon (16 cups) of water. Read labels of cleaning products and follow recommendations provided on product labels. Labels contain instructions for safe and effective use of the cleaning product including precautions you should take when applying the product, such as wearing gloves or eye protection and making sure you have good ventilation during use of the product. Remove gloves and wash hands immediately after cleaning.  Monitor yourself for signs and symptoms of illness Caregivers and household members are considered close contacts, should monitor their health, and will be asked to limit movement outside of the home to the extent possible. Follow the monitoring steps for close contacts listed on the symptom monitoring form.   ? If you have additional questions, contact your local health department or call the epidemiologist on call at [phone] (available 24/7). ? This guidance is subject to change. For the most up-to-date guidance from [facility], please refer to their website: [email or web address]

## 2022-04-20 NOTE — Telephone Encounter (Signed)
  Chief Complaint: COVID pos Symptoms: cough and congestion, body aches, fever, HA, sore throat nasal congestion  Frequency: Sunday morning  Pertinent Negatives:NA Disposition: '[]'$ ED /'[]'$ Urgent Care (no appt availability in office) / '[x]'$ Appointment(In office/virtual)/ '[]'$  [place] [facility]/ '[]'$ Home Care/ '[]'$ Refused Recommended Disposition /'[]'$ [place] [facility]/ '[]'$  Follow-up with PCP Additional Notes: pt states she tested positive yesterday doing a home COVID test and has been taking OTC meds but not getting any better. Scheduled VV for today at 1520 with Dr. Ky Barban. Care advice given and pt verbalized understanding.   Reason for Disposition  [1] COVID-19 diagnosed by positive lab test (e.g., PCR, rapid self-test kit) AND [2] mild symptoms (e.g., cough, fever, others) AND [0] no complications or SOB  Answer Assessment - Initial Assessment Questions 1. COVID-19 DIAGNOSIS: "How do you know that you have COVID?" (e.g., positive lab test or self-test, diagnosed by doctor or NP/PA, symptoms after exposure).     Home test positive yesterday  3. ONSET: "When did the COVID-19 symptoms start?"      Sunday morning  5. COUGH: "Do you have a cough?" If Yes, ask: "How bad is the cough?"       yes 6. FEVER: "Do you have a fever?" If Yes, ask: "What is your temperature, how was it measured, and when did it start?"     Yes low grade  7. RESPIRATORY STATUS: "Describe your breathing?" (e.g., normal; shortness of breath, wheezing, unable to speak)      SOB with coughing spells  8. BETTER-SAME-WORSE: "Are you getting better, staying the same or getting worse compared to yesterday?"  If getting worse, ask, "In what way?"     Worse  9. OTHER SYMPTOMS: "Do you have any other symptoms?"  (e.g., chills, fatigue, headache, loss of smell or taste, muscle pain, sore throat)     Body aches, fever, cough and congestion, nasal congestion, HA, sore throat 10. HIGH RISK DISEASE: "Do you have any chronic medical  problems?" (e.g., asthma, heart or lung disease, weak immune system, obesity, etc.)       no 11. VACCINE: "Have you had the COVID-19 vaccine?" If Yes, ask: "Which one, how many shots, when did you get it?"       yes  Protocols used: Coronavirus (COVID-19) Diagnosed or Suspected-A-AH

## 2022-04-20 NOTE — Progress Notes (Signed)
Virtual Visit via Video Note  I connected with [name] on 04/20/22 at  3:20 PM EST by a video enabled telemedicine application and verified that I am speaking with the correct person using two identifiers.  Location: Patient: home Provider: home   I discussed the limitations of evaluation and management by telemedicine and the availability of in person appointments. The patient expressed understanding and agreed to proceed.  History of Present Illness:  UPPER RESPIRATORY TRACT INFECTION - symptom onset 2 days ago - COVID positive  Fever: yes Cough: yes, dry. Keeping up at night. Shortness of breath: with coughing spells Wheezing: no Chest pain: no Chest tightness: no Chest congestion: no Nasal congestion: yes Sore throat: yes Headache: yes Face pain: yes Vomiting: no Relief with OTC cold/cough medications: yes  Treatments attempted: mucinex, tylenol, ibuprofen   Observations/Objective:  Tired appearing, in NAD. Speaks in full sentences, congested sounding. Comfortable WOB on RA. No resp distress.    Assessment and Plan:  COVID-19 Moderate sx. Is a candidate for COVID treatment, paxlovid sent to pharmacy. Reviewed self-quarantine guidelines, return and emergency precautions.      I discussed the assessment and treatment plan with the patient. The patient was provided an opportunity to ask questions and all were answered. The patient agreed with the plan and demonstrated an understanding of the instructions.   The patient was advised to call back or seek an in-person evaluation if the symptoms worsen or if the condition fails to improve as anticipated.  I provided 9 minutes of non-face-to-face time during this encounter.   Myles Gip, DO

## 2022-04-22 ENCOUNTER — Ambulatory Visit: Payer: BC Managed Care – PPO | Admitting: Family Medicine

## 2022-04-23 ENCOUNTER — Other Ambulatory Visit: Payer: Self-pay | Admitting: Family Medicine

## 2022-04-23 NOTE — Telephone Encounter (Signed)
Medication Refill - Medication: lisinopril (ZESTRIL) 20 MG tablet , DULoxetine (CYMBALTA) 60 MG capsule   Has the patient contacted their pharmacy? No. No, more refills.  (Agent: If no, request that the patient contact the pharmacy for the refill. If patient does not wish to contact the pharmacy document the reason why and proceed with request.)   Preferred Pharmacy (with phone number or street name):  Has the patient been seen for an appointment in the last year OR does the [place].  [place] [address]  Phone: [phone] Fax: [phone]  Hours: Not open 24 hours  nt have an upcoming appointment? Yes.    Agent: Please be advised that RX refills may take up to 3 business days. We ask that you follow-up with your pharmacy.

## 2022-04-26 ENCOUNTER — Other Ambulatory Visit: Payer: Self-pay | Admitting: Family Medicine

## 2022-04-26 MED ORDER — LISINOPRIL 20 MG PO TABS: 20.0000 mg | ORAL_TABLET | Freq: Every day | ORAL | 0 refills | Status: DC

## 2022-04-26 MED ORDER — DULOXETINE HCL 60 MG PO CPEP: ORAL_CAPSULE | ORAL | 0 refills | Status: DC

## 2022-04-26 NOTE — Telephone Encounter (Signed)
Requested Prescriptions  Pending Prescriptions Disp Refills   lisinopril (ZESTRIL) 20 MG tablet 90 tablet 0    Sig: Take 1 tablet (20 mg total) by mouth daily.     Cardiovascular:  ACE Inhibitors Failed - 04/23/2022  4:13 PM      Failed - Last BP in normal range    BP Readings from Last 1 Encounters:  01/14/22 (!) 144/97         Passed - Cr in normal range and within 180 days    Creatinine  Date Value Ref Range Status  08/14/2013 0.67 0.60 - 1.30 mg/dL Final   Creatinine, Ser  Date Value Ref Range Status  01/14/2022 0.81 0.57 - 1.00 mg/dL Final         Passed - K in normal range and within 180 days    Potassium  Date Value Ref Range Status  01/14/2022 3.9 3.5 - 5.2 mmol/L Final  08/14/2013 4.0 3.5 - 5.1 mmol/L Final         Passed - Patient is not pregnant      Passed - Valid encounter within last 6 months    Recent Outpatient Visits           6 days ago [place] Myles Gip, DO   2 months ago Upper respiratory tract infection, unspecified type   , [place], DO   3 months ago Routine general medical examination at a health care facility   [place], [place] P, DO   5 months ago Grade II hemorrhoids   [place], [place] T, NP   6 months ago Depression, recurrent [facility])   [place], [place], DO       Future Appointments             In 1 week [place], Barb Merino, DO [place], [facility]             DULoxetine (CYMBALTA) 60 MG capsule 90 capsule 0    Sig: TAKE 1 CAPSULE(60 MG) BY MOUTH DAILY     Psychiatry: Antidepressants - SNRI - duloxetine Failed - 04/23/2022  4:13 PM      Failed - Last BP in normal range    BP Readings from Last 1 Encounters:  01/14/22 (!) 144/97         Passed - Cr in normal range and within 360 days    Creatinine  Date Value  Ref Range Status  08/14/2013 0.67 0.60 - 1.30 mg/dL Final   Creatinine, Ser  Date Value Ref Range Status  01/14/2022 0.81 0.57 - 1.00 mg/dL Final         Passed - eGFR is 30 or above and within 360 days    EGFR (African American)  Date Value Ref Range Status  08/14/2013 >60  Final   GFR calc Af Amer  Date Value Ref Range Status  06/01/2019 86 >59 mL/min/1.73 Final   EGFR (Non-African Amer.)  Date Value Ref Range Status  08/14/2013 >60  Final    Comment:    eGFR values <40m/min/1.73 m2 may be an indication of chronic kidney disease (CKD). Calculated eGFR is useful in patients with stable renal function. The eGFR calculation will not be reliable in acutely ill patients when serum creatinine is changing rapidly. It is not useful in  patients on dialysis. The eGFR calculation may  not be applicable to patients at the low and high extremes of body sizes, pregnant women, and vegetarians.    GFR calc non Af Amer  Date Value Ref Range Status  06/01/2019 75 >59 mL/min/1.73 Final   eGFR  Date Value Ref Range Status  01/14/2022 86 >59 mL/min/1.73 Final         Passed - Completed PHQ-2 or PHQ-9 in the last 360 days      Passed - Valid encounter within last 6 months    Recent Outpatient Visits           6 days ago [place] Myles Gip, DO   2 months ago Upper respiratory tract infection, unspecified type   [place], [place], DO   3 months ago Routine general medical examination at a health care facility   [facility], [place], DO   5 months ago Grade II hemorrhoids   [place], Henrine Screws T, NP   6 months ago Depression, recurrent [facility])   [place], DO       Future Appointments             In 1 week Wynetta Emery, Barb Merino, DO [place], [facility]

## 2022-04-27 NOTE — Telephone Encounter (Signed)
Unable to refill per protocol, Rx request is too soon. Will refuse.  Requested Prescriptions  Pending Prescriptions Disp Refills   lisinopril (ZESTRIL) 20 MG tablet [Pharmacy Med Name: LISINOPRIL 20 MG TAB] 90 tablet 0    Sig: TAKE 1 TABLET BY MOUTH ONCE DAILY     Cardiovascular:  ACE Inhibitors Failed - 04/26/2022  5:07 PM      Failed - Last BP in normal range    BP Readings from Last 1 Encounters:  01/14/22 (!) 144/97         Passed - Cr in normal range and within 180 days    Creatinine  Date Value Ref Range Status  08/14/2013 0.67 0.60 - 1.30 mg/dL Final   Creatinine, Ser  Date Value Ref Range Status  01/14/2022 0.81 0.57 - 1.00 mg/dL Final         Passed - K in normal range and within 180 days    Potassium  Date Value Ref Range Status  01/14/2022 3.9 3.5 - 5.2 mmol/L Final  08/14/2013 4.0 3.5 - 5.1 mmol/L Final         Passed - Patient is not pregnant      Passed - Valid encounter within last 6 months    Recent Outpatient Visits           1 week ago [place] Myles Gip, DO   2 months ago Upper respiratory tract infection, unspecified type   [place], [place], DO   3 months ago Routine general medical examination at a health care facility   [facility], [place] P, DO   5 months ago Grade II hemorrhoids   [place], [place] T, NP   6 months ago Depression, recurrent [facility])   [place], [place], DO       Future Appointments             In 6 days Wynetta Emery, Barb Merino, DO [place], [facility]             metoprolol succinate (TOPROL-XL) 100 MG 24 hr tablet [Pharmacy Med Name: METOPROLOL SUCCINATE ER 100 MG TAB] 90 tablet 1    Sig: TAKE 1 TABLET BY MOUTH ONCE DAILY     Cardiovascular:  Beta Blockers Failed - 04/26/2022  5:07 PM      Failed - Last BP in normal  range    BP Readings from Last 1 Encounters:  01/14/22 (!) 144/97         Passed - Last Heart Rate in normal range    Pulse Readings from Last 1 Encounters:  01/14/22 93         Passed - Valid encounter within last 6 months    Recent Outpatient Visits           1 week ago [place] Myles Gip, DO   2 months ago Upper respiratory tract infection, unspecified type   [place], Megan P, DO   3 months ago Routine general medical examination at a health care facility   [place], Megan P, DO   5 months ago Grade II hemorrhoids   [place], Agar T, NP   6 months ago Depression, recurrent [facility])   [place]  Valerie Roys, DO       Future Appointments             In 6 days Wynetta Emery, Barb Merino, DO [place], [facility]

## 2022-05-03 ENCOUNTER — Ambulatory Visit: Payer: BC Managed Care – PPO | Admitting: Family Medicine

## 2022-05-03 ENCOUNTER — Encounter: Payer: Self-pay | Admitting: Family Medicine

## 2022-05-03 VITALS — BP 132/91 | HR 76 | Temp 97.8°F | Wt 146.5 lb

## 2022-05-03 DIAGNOSIS — F419 Anxiety disorder, unspecified: Secondary | ICD-10-CM | POA: Diagnosis not present

## 2022-05-03 DIAGNOSIS — E785 Hyperlipidemia, unspecified: Secondary | ICD-10-CM

## 2022-05-03 DIAGNOSIS — Z1211 Encounter for screening for malignant neoplasm of colon: Secondary | ICD-10-CM

## 2022-05-03 DIAGNOSIS — F339 Major depressive disorder, recurrent, unspecified: Secondary | ICD-10-CM | POA: Diagnosis not present

## 2022-05-03 DIAGNOSIS — I10 Essential (primary) hypertension: Secondary | ICD-10-CM | POA: Diagnosis not present

## 2022-05-03 MED ORDER — DULOXETINE HCL 60 MG PO CPEP: ORAL_CAPSULE | ORAL | 1 refills | Status: DC

## 2022-05-03 MED ORDER — LISINOPRIL 20 MG PO TABS: 20.0000 mg | ORAL_TABLET | Freq: Every day | ORAL | 1 refills | Status: DC

## 2022-05-03 MED ORDER — ROSUVASTATIN CALCIUM 5 MG PO TABS: 5.0000 mg | ORAL_TABLET | Freq: Every day | ORAL | 1 refills | Status: DC

## 2022-05-03 NOTE — Assessment & Plan Note (Signed)
Under good control on current regimen. Continue current regimen. Continue to monitor. Call with any concerns. Refills given. Labs drawn today.

## 2022-05-03 NOTE — Progress Notes (Signed)
BP (!) 132/91   Pulse 76   Temp 97.8 F (36.6 C) (Oral)   Wt 146 lb 8 oz (66.5 kg)   SpO2 98%   BMI 24.38 kg/m    Subjective:    Patient ID: Morgan Cisneros, female    DOB: [birth year], 57 y.o.   MRN: [id number]  HPI: Morgan Cisneros is a 58 y.o. female  Chief Complaint  Patient presents with   Hypertension   Anxiety   HYPERTENSION / HYPERLIPIDEMIA Satisfied with current treatment? yes Duration of hypertension: chronic BP monitoring frequency: not checking BP medication side effects: no Past BP meds: lisinopril, metoprolol Duration of hyperlipidemia: chronic Cholesterol medication side effects: no Cholesterol supplements: none Past cholesterol medications: crestor Medication compliance: excellent compliance Aspirin: no Recent stressors: no Recurrent headaches: no Visual changes: no Palpitations: no Dyspnea: no Chest pain: no Lower extremity edema: no Dizzy/lightheaded: no  ANXIETY/DEPRESSION Duration: chronic Status:better Anxious mood: yes  Excessive worrying: no Irritability: no  Sweating: no Nausea: no Palpitations:no Hyperventilation: no Panic attacks: no Agoraphobia: no  Obscessions/compulsions: no Depressed mood: no    05/03/2022    4:18 PM 01/14/2022    3:43 PM 11/27/2021   11:35 AM 10/19/2021    9:00 AM 10/01/2021    4:26 PM  Depression screen PHQ 2/9  Decreased Interest '1 2 1 3 2  '$ Down, Depressed, Hopeless '1 2 2 3 2  '$ PHQ - 2 Score '2 4 3 6 4  '$ Altered sleeping '1 3 2 2 2  '$ Tired, decreased energy '1 2 2 2 2  '$ Change in appetite 1 2 0 0 2  Feeling bad or failure about yourself  '1 2 1 3 2  '$ Trouble concentrating 2 2 0 3 2  Moving slowly or fidgety/restless 0 0 0 0 0  Suicidal thoughts 0 0 0 0 0  PHQ-9 Score '8 15 8 16 14  '$ Difficult doing work/chores Not difficult at all   Somewhat difficult Somewhat difficult      05/03/2022    4:20 PM 01/14/2022    3:43 PM 11/27/2021   11:35 AM 10/19/2021    9:00 AM  GAD 7 : Generalized Anxiety Score   Nervous, Anxious, on Edge '1 2 2 3  '$ Control/stop worrying '1 2 2 3  '$ Worry too much - different things '1 2 2 3  '$ Trouble relaxing '1 3 2 3  '$ Restless 0 3 0 1  Easily annoyed or irritable 0 0 0 0  Afraid - awful might happen '1 2 1 3  '$ Total GAD 7 Score '5 14 9 16  '$ Anxiety Difficulty Somewhat difficult   Somewhat difficult   Anhedonia: no Weight changes: no Insomnia: no   Hypersomnia: no Fatigue/loss of energy: no Feelings of worthlessness: no Feelings of guilt: no Impaired concentration/indecisiveness: no Suicidal ideations: no  Crying spells: no Recent Stressors/Life Changes: no   Relationship problems: no   Family stress: no     Financial stress: no    Job stress: no    Recent death/loss: yes  Relevant past medical, surgical, family and social history reviewed and updated as indicated. Interim medical history since our last visit reviewed. Allergies and medications reviewed and updated.  Review of Systems  Constitutional: Negative.   Cardiovascular: Negative.   Gastrointestinal: Negative.   Musculoskeletal: Negative.   Neurological: Negative.   Psychiatric/Behavioral: Negative.      Per HPI unless specifically indicated above     Objective:    BP (!) 132/91   Pulse 76  Temp 97.8 F (36.6 C) (Oral)   Wt 146 lb 8 oz (66.5 kg)   SpO2 98%   BMI 24.38 kg/m   Wt Readings from Last 3 Encounters:  05/03/22 146 lb 8 oz (66.5 kg)  01/14/22 142 lb 4.8 oz (64.5 kg)  12/06/21 138 lb (62.6 kg)    Physical Exam Vitals and nursing note reviewed.  Constitutional:      General: She is not in acute distress.    Appearance: Normal appearance. She is not ill-appearing, toxic-appearing or diaphoretic.  HENT:     Head: Normocephalic and atraumatic.     Right Ear: External ear normal.     Left Ear: External ear normal.     Nose: Nose normal.     Mouth/Throat:     Mouth: Mucous membranes are moist.     Pharynx: Oropharynx is clear.  Eyes:     General: No scleral icterus.        Right eye: No discharge.        Left eye: No discharge.     Extraocular Movements: Extraocular movements intact.     Conjunctiva/sclera: Conjunctivae normal.     Pupils: Pupils are equal, round, and reactive to light.  Cardiovascular:     Rate and Rhythm: Normal rate and regular rhythm.     Pulses: Normal pulses.     Heart sounds: Normal heart sounds. No murmur heard.    No friction rub. No gallop.  Pulmonary:     Effort: Pulmonary effort is normal. No respiratory distress.     Breath sounds: Normal breath sounds. No stridor. No wheezing, rhonchi or rales.  Chest:     Chest wall: No tenderness.  Musculoskeletal:        General: Normal range of motion.     Cervical back: Normal range of motion and neck supple.  Skin:    General: Skin is warm and dry.     Capillary Refill: Capillary refill takes less than 2 seconds.     Coloration: Skin is not jaundiced or pale.     Findings: No bruising, erythema, lesion or rash.  Neurological:     General: No focal deficit present.     Mental Status: She is alert and oriented to person, place, and time. Mental status is at baseline.  Psychiatric:        Mood and Affect: Mood normal.        Behavior: Behavior normal.        Thought Content: Thought content normal.        Judgment: Judgment normal.     Results for orders placed or performed in visit on 01/25/22  Lipid Panel w/o Chol/HDL Ratio  Result Value Ref Range   Cholesterol, Total 263 (H) 100 - 199 mg/dL   Triglycerides 393 (H) 0 - 149 mg/dL   HDL 51 >39 mg/dL   VLDL Cholesterol Cal 72 (H) 5 - 40 mg/dL   LDL Chol Calc (NIH) 140 (H) 0 - 99 mg/dL      Assessment & Plan:   Problem List Items Addressed This Visit       Cardiovascular and Mediastinum   Essential hypertension - Primary    Under good control on current regimen. Continue current regimen. Continue to monitor. Call with any concerns. Refills given. Labs drawn today.       Relevant Medications   lisinopril  (ZESTRIL) 20 MG tablet   rosuvastatin (CRESTOR) 5 MG tablet   Other Relevant Orders   Basic  metabolic panel     Other   Anxiety    Under good control on current regimen. Has not needed her lorazepam. Does not need refill at this time. Continue current regimen. Continue to monitor. Call with any concerns. Refills given.       Relevant Medications   DULoxetine (CYMBALTA) 60 MG capsule   Depression, recurrent ([place])    Under good control on current regimen. Has not needed her lorazepam. Does not need refill at this time. Continue current regimen. Continue to monitor. Call with any concerns. Refills given.       Relevant Medications   DULoxetine (CYMBALTA) 60 MG capsule   Hyperlipidemia    Under good control on current regimen. Continue current regimen. Continue to monitor. Call with any concerns. Refills given. Labs drawn today.       Relevant Medications   lisinopril (ZESTRIL) 20 MG tablet   rosuvastatin (CRESTOR) 5 MG tablet   Other Relevant Orders   Lipid Panel w/o Chol/HDL Ratio   Other Visit Diagnoses     Screening for colon cancer       Due for follow up colonoscopy. Referral generated today.   Relevant Orders   Ambulatory referral to Gastroenterology        Follow up plan: Return in about 6 months (around 11/01/2022), or physical.

## 2022-05-03 NOTE — Assessment & Plan Note (Signed)
Under good control on current regimen. Has not needed her lorazepam. Does not need refill at this time. Continue current regimen. Continue to monitor. Call with any concerns. Refills given.

## 2022-05-04 ENCOUNTER — Encounter: Payer: Self-pay | Admitting: Family Medicine

## 2022-05-04 ENCOUNTER — Telehealth: Payer: Self-pay

## 2022-05-04 ENCOUNTER — Other Ambulatory Visit: Payer: Self-pay

## 2022-05-04 DIAGNOSIS — Z86010 Personal history of colonic polyps: Secondary | ICD-10-CM

## 2022-05-04 DIAGNOSIS — Z800 Family history of malignant neoplasm of digestive organs: Secondary | ICD-10-CM

## 2022-05-04 LAB — BASIC METABOLIC PANEL
BUN/Creatinine Ratio: 24 — ABNORMAL HIGH (ref 9–23)
BUN: 17 mg/dL (ref 6–24)
CO2: 22 mmol/L (ref 20–29)
Calcium: 9.2 mg/dL (ref 8.7–10.2)
Chloride: 101 mmol/L (ref 96–106)
Creatinine, Ser: 0.72 mg/dL (ref 0.57–1.00)
Glucose: 82 mg/dL (ref 70–99)
Potassium: 4.4 mmol/L (ref 3.5–5.2)
Sodium: 139 mmol/L (ref 134–144)
eGFR: 99 mL/min/{1.73_m2} (ref >59)

## 2022-05-04 LAB — LIPID PANEL W/O CHOL/HDL RATIO
Cholesterol, Total: 250 mg/dL — ABNORMAL HIGH (ref 100–199)
HDL: 48 mg/dL (ref >39)
LDL Chol Calc (NIH): 84 mg/dL (ref 0–99)
Triglycerides: 728 mg/dL (ref 0–149)
VLDL Cholesterol Cal: 118 mg/dL — ABNORMAL HIGH (ref 5–40)

## 2022-05-04 MED ORDER — NA SULFATE-K SULFATE-MG SULF 17.5-3.13-1.6 GM/177ML PO SOLN: 1.0000 | Freq: Once | ORAL | 0 refills | Status: AC

## 2022-05-04 NOTE — Telephone Encounter (Signed)
Gastroenterology Pre-Procedure Review  Request Date: 05/21/22 Requesting Physician: Dr. Allen Norris  PATIENT REVIEW QUESTIONS: The patient responded to the following health history questions as indicated:   Allergy contraindication received  for Suprep-however patient stated it was fine. The allergy contraindication stated potassium citrate caused nausea. She confirmed and said it was fine everything causes nausea for her.   1. Are you having any GI issues?  IBS Diarrhea controlled  2. Do you have a personal history of Polyps? yes (last colonoscopy performed by Dr. Allen Norris 04/25/17 some polyps were present) 3. Do you have a family history of Colon Cancer or Polyps? yes (mother ovarian cancer that spread to colon, and mother had large polyps) 4. Diabetes Mellitus? no 5. Joint replacements in the past 12 months?no 6. Major health problems in the past 3 months?no 7. Any artificial heart valves, MVP, or defibrillator?no    MEDICATIONS & ALLERGIES:    Patient reports the following regarding taking any anticoagulation/antiplatelet therapy:   Plavix, Coumadin, Eliquis, Xarelto, Lovenox, Pradaxa, Brilinta, or Effient? no Aspirin? no  Patient confirms/reports the following medications:  Current Outpatient Medications  Medication Sig Dispense Refill   albuterol (VENTOLIN HFA) 108 (90 Base) MCG/ACT inhaler Inhale 2 puffs into the lungs every 6 (six) hours as needed for wheezing or shortness of breath. 8 g 0   DULoxetine (CYMBALTA) 60 MG capsule TAKE 1 CAPSULE(60 MG) BY MOUTH DAILY 90 capsule 1   lisinopril (ZESTRIL) 20 MG tablet Take 1 tablet (20 mg total) by mouth daily. 90 tablet 1   LORazepam (ATIVAN) 0.5 MG tablet Take 1 tablet (0.5 mg total) by mouth 2 (two) times daily as needed for anxiety. 45 tablet 2   metoprolol succinate (TOPROL-XL) 100 MG 24 hr tablet TAKE 1 TABLET(100 MG) BY MOUTH DAILY 90 tablet 1   rosuvastatin (CRESTOR) 5 MG tablet Take 1 tablet (5 mg total) by mouth daily. 90 tablet 1    sucralfate (CARAFATE) 1 g tablet Take 1 tablet (1 g total) by mouth 4 (four) times daily -  with meals and at bedtime. 120 tablet 3   No current facility-administered medications for this visit.    Patient confirms/reports the following allergies:  Allergies  Allergen Reactions   Wellbutrin [Bupropion] Other (See Comments)    irritiability   Urocit-K [Potassium Citrate] Nausea Only    No orders of the defined types were placed in this encounter.   AUTHORIZATION INFORMATION Primary Insurance: 1D#: Group #:  Secondary Insurance: 1D#: Group #:  SCHEDULE INFORMATION: Date: 05/21/22 Time: Location: [facility]

## 2022-05-05 ENCOUNTER — Other Ambulatory Visit: Payer: Self-pay | Admitting: Family Medicine

## 2022-05-05 MED ORDER — ROSUVASTATIN CALCIUM 10 MG PO TABS: 10.0000 mg | ORAL_TABLET | Freq: Every day | ORAL | 0 refills | Status: DC

## 2022-05-11 ENCOUNTER — Telehealth: Payer: Self-pay

## 2022-05-11 NOTE — Telephone Encounter (Signed)
Pt want to r/s procedure [phone]

## 2022-05-11 NOTE — Telephone Encounter (Signed)
Patients call has been returned.  Her colonoscopy has been rescheduled to 05/28/22.  [facility] has been informed of date change.  Referral updated.  New instructions mailed.  Thanks, [place], [place]

## 2022-05-25 ENCOUNTER — Encounter: Payer: Self-pay | Admitting: Gastroenterology

## 2022-05-28 ENCOUNTER — Ambulatory Visit: Payer: BC Managed Care – PPO | Admitting: Anesthesiology

## 2022-05-28 ENCOUNTER — Ambulatory Visit
Admission: RE | Admit: 2022-05-28 | Discharge: 2022-05-28 | Disposition: A | Discharge status: Home / Self Care | Payer: BC Managed Care – PPO | Attending: Gastroenterology | Admitting: Gastroenterology

## 2022-05-28 ENCOUNTER — Encounter
Admission: RE | Disposition: A | Discharge status: Home / Self Care | Payer: Self-pay | Source: Home / Self Care | Attending: Gastroenterology

## 2022-05-28 ENCOUNTER — Other Ambulatory Visit: Payer: Self-pay

## 2022-05-28 ENCOUNTER — Encounter: Payer: Self-pay | Admitting: Gastroenterology

## 2022-05-28 DIAGNOSIS — D122 Benign neoplasm of ascending colon: Secondary | ICD-10-CM | POA: Diagnosis not present

## 2022-05-28 DIAGNOSIS — Z1211 Encounter for screening for malignant neoplasm of colon: Secondary | ICD-10-CM | POA: Insufficient documentation

## 2022-05-28 DIAGNOSIS — K635 Polyp of colon: Secondary | ICD-10-CM | POA: Insufficient documentation

## 2022-05-28 DIAGNOSIS — F17210 Nicotine dependence, cigarettes, uncomplicated: Secondary | ICD-10-CM | POA: Diagnosis not present

## 2022-05-28 DIAGNOSIS — K640 First degree hemorrhoids: Secondary | ICD-10-CM | POA: Diagnosis not present

## 2022-05-28 DIAGNOSIS — Z86010 Personal history of colonic polyps: Secondary | ICD-10-CM

## 2022-05-28 DIAGNOSIS — Z800 Family history of malignant neoplasm of digestive organs: Secondary | ICD-10-CM

## 2022-05-28 DIAGNOSIS — I10 Essential (primary) hypertension: Secondary | ICD-10-CM | POA: Diagnosis not present

## 2022-05-28 DIAGNOSIS — K5730 Diverticulosis of large intestine without perforation or abscess without bleeding: Secondary | ICD-10-CM | POA: Insufficient documentation

## 2022-05-28 HISTORY — DX: Irritable bowel syndrome, unspecified: K58.9

## 2022-05-28 HISTORY — PX: COLONOSCOPY WITH PROPOFOL: SHX5780

## 2022-05-28 HISTORY — PX: POLYPECTOMY: SHX5525

## 2022-05-28 SURGERY — COLONOSCOPY WITH PROPOFOL
Anesthesia: General | Site: Rectum

## 2022-05-28 MED ORDER — STERILE WATER FOR IRRIGATION IR SOLN
Status: DC | PRN
  Administered 2022-05-28: 50 mL

## 2022-05-28 MED ORDER — STERILE WATER FOR IRRIGATION IR SOLN
Status: DC | PRN
  Administered 2022-05-28: .05 mL

## 2022-05-28 MED ORDER — LIDOCAINE HCL (CARDIAC) PF 100 MG/5ML IV SOSY
PREFILLED_SYRINGE | INTRAVENOUS | Status: DC | PRN
  Administered 2022-05-28: 80 mg via INTRAVENOUS

## 2022-05-28 MED ORDER — PROPOFOL 10 MG/ML IV BOLUS
INTRAVENOUS | Status: DC | PRN
  Administered 2022-05-28: 80 mg via INTRAVENOUS
  Administered 2022-05-28: 30 mg via INTRAVENOUS
  Administered 2022-05-28: 20 mg via INTRAVENOUS
  Administered 2022-05-28: 90 mg via INTRAVENOUS
  Administered 2022-05-28: 20 mg via INTRAVENOUS

## 2022-05-28 MED ORDER — SODIUM CHLORIDE 0.9 % IV SOLN: INTRAVENOUS | Status: DC

## 2022-05-28 SURGICAL SUPPLY — 8 items
GOWN CVR UNV OPN BCK APRN NK (MISCELLANEOUS) ×4 IMPLANT
GOWN ISOL THUMB LOOP REG UNIV (MISCELLANEOUS) ×4
KIT PRC NS LF DISP ENDO (KITS) ×2 IMPLANT
KIT PROCEDURE OLYMPUS (KITS) ×2
MANIFOLD NEPTUNE II (INSTRUMENTS) ×2 IMPLANT
SNARE COLD EXACTO (MISCELLANEOUS) IMPLANT
TRAP ETRAP POLY (MISCELLANEOUS) IMPLANT
WATER STERILE IRR 250ML POUR (IV SOLUTION) ×2 IMPLANT

## 2022-05-28 NOTE — Anesthesia Postprocedure Evaluation (Signed)
Anesthesia Post Note  Patient: Morgan Cisneros  Procedure(s) Performed: COLONOSCOPY WITH PROPOFOL (Rectum) POLYPECTOMY (Rectum)  Patient location during evaluation: PACU Anesthesia Type: General Level of consciousness: awake and alert Pain management: pain level controlled Vital Signs Assessment: post-procedure vital signs reviewed and stable Respiratory status: spontaneous breathing, nonlabored ventilation, respiratory function stable and patient connected to nasal cannula oxygen Cardiovascular status: blood pressure returned to baseline and stable Postop Assessment: no apparent nausea or vomiting Anesthetic complications: no  No notable events documented.   Last Vitals:  Vitals:   05/28/22 1030 05/28/22 1043  BP: (!) 80/53 96/73  Pulse: 80   Resp: (!) 21   Temp: 36.6 C   SpO2: 95%     Last Pain:  Vitals:   05/28/22 1043  TempSrc:   PainSc: 0-No pain                 Dimas Millin

## 2022-05-28 NOTE — Anesthesia Preprocedure Evaluation (Signed)
Anesthesia Evaluation  Patient identified by MRN, date of birth, ID band Patient awake    Reviewed: Allergy & Precautions, H&P , NPO status , Patient's Chart, lab work & pertinent test results  Airway Mallampati: II  TM Distance: >3 FB Neck ROM: Full    Dental no notable dental hx. (+) Chipped   Pulmonary neg pulmonary ROS, Current SmokerPatient did not abstain from smoking.   Pulmonary exam normal        Cardiovascular hypertension, negative cardio ROS Normal cardiovascular exam     Neuro/Psych negative neurological ROS  negative psych ROS   GI/Hepatic negative GI ROS, Neg liver ROS,  Medicated,,  Endo/Other  negative endocrine ROS    Renal/GU negative Renal ROS  negative genitourinary   Musculoskeletal   Abdominal   Peds  Hematology negative hematology ROS (+)   Anesthesia Other Findings Past Medical History: No date: Abdominal pain No date: Acute gastritis without hemorrhage 12/23/2016: Apocrine cyst 02/01/2017: Appendicitis 10/2016: C. 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[place]) No date: Insomnia 12/13/2014: LBP (low back pain) No date: Ovarian cyst No date: Renal calculus, right No date: Renal calculus, right No date: Thoracic back pain     Comment:  Right sided  Past Surgical History: 08/10/2016: BREAST BIOPSY; Left     Comment:  [place] core, [place] 01/17/2020: BREAST BIOPSY; Right     Comment:  no clip due to pt condition during biopsy-neg 02/07/2020: BREAST BIOPSY; Right     Comment:  re-biopsy due to prior biopsy/ coil clip/ path pending 04/25/2017: COLONOSCOPY WITH PROPOFOL; N/A     Comment:  Procedure: COLONOSCOPY  WITH PROPOFOL;  Surgeon: Lucilla Lame, MD;  Location: [facility];  Service:               Endoscopy;  Laterality: N/A; 11/26/2010: CYSTOSCOPY     Comment:  with stent placement 04/25/2017: ESOPHAGOGASTRODUODENOSCOPY (EGD) WITH PROPOFOL; N/A     Comment:  Procedure: ESOPHAGOGASTRODUODENOSCOPY (EGD) WITH               PROPOFOL;  Surgeon: Lucilla Lame, MD;  Location: [facility];  Service: Endoscopy;  Laterality: N/A; No date: HEMORRHOID SURGERY No date: kidney stent 02/02/2017: LAPAROSCOPIC APPENDECTOMY; N/A     Comment:  Procedure: APPENDECTOMY LAPAROSCOPIC;  Surgeon: Clayburn Pert, MD;  Location: [facility];  Service: General;                Laterality: N/A; 11/01/2016: LAPAROSCOPIC SIGMOID COLECTOMY; N/A     Comment:  Procedure: LAPAROSCOPIC SIGMOID COLECTOMY;  Surgeon:               Clayburn Pert, MD;  Location: [facility];  Service:               General;  Laterality: N/A; No date: LITHOTRIPSY     Comment:  X 4 04/25/2017: POLYPECTOMY     Comment:  Procedure: POLYPECTOMY INTESTINAL;  Surgeon: Lucilla Lame, MD;  Location: [facility];  Service:               Endoscopy;; No date: TONSILLECTOMY 08/2013: TOTAL ABDOMINAL HYSTERECTOMY No date: TOTAL ABDOMINAL HYSTERECTOMY W/ BILATERAL SALPINGOOPHORECTOMY No date: TUBAL LIGATION 11/21/1997: Ureteroscopy; Right     Comment:  with holmium laser  BMI    Body Mass Index: 23.46 kg/m      Reproductive/Obstetrics negative OB ROS                             Anesthesia Physical Anesthesia Plan  ASA: 2  Anesthesia Plan: General   Post-op Pain Management: Minimal or no pain anticipated   Induction: Intravenous  PONV Risk Score and Plan: 3 and Propofol infusion, TIVA and Ondansetron  Airway Management Planned: Nasal Cannula  Additional Equipment: None  Intra-op Plan:   Post-operative Plan:   Informed Consent: I have  reviewed the patients History and Physical, chart, labs and discussed the procedure including the risks, benefits and alternatives for the proposed anesthesia with the patient or authorized representative who has indicated his/her understanding and acceptance.     Dental advisory given  Plan Discussed with: CRNA and Surgeon  Anesthesia Plan Comments: (Discussed risks of anesthesia with patient, including possibility of difficulty with spontaneous ventilation under anesthesia necessitating airway intervention, PONV, and rare risks such as cardiac or respiratory or neurological events, and allergic reactions. Discussed the role of CRNA in patient's perioperative care. Patient understands.)        Anesthesia Quick Evaluation

## 2022-05-28 NOTE — Transfer of Care (Signed)
Immediate Anesthesia Transfer of Care Note  Patient: Morgan Cisneros  Procedure(s) Performed: COLONOSCOPY WITH PROPOFOL (Rectum) POLYPECTOMY (Rectum)  Patient Location: PACU  Anesthesia Type: General  Level of Consciousness: awake, alert  and patient cooperative  Airway and Oxygen Therapy: Patient Spontanous Breathing and Patient connected to supplemental oxygen  Post-op Assessment: Post-op Vital signs reviewed, Patient's Cardiovascular Status Stable, Respiratory Function Stable, Patent Airway and No signs of Nausea or vomiting  Post-op Vital Signs: Reviewed and stable  Complications: No notable events documented.

## 2022-05-28 NOTE — Op Note (Signed)
[facility] Gastroenterology Patient Name: Morgan Cisneros Procedure Date: 05/28/2022 10:11 AM MRN: [id number] Account #: [id number] Date of Birth: [birth year] Admit Type: Outpatient Age: 57 Room: [facility] OR ROOM 01 Gender: Female Note Status: Finalized Instrument Name: [id number] Procedure:             Colonoscopy Indications:           Screening for colorectal malignant neoplasm Providers:             Lucilla Lame MD, MD Referring MD:          Valerie Roys (Referring MD) Medicines:             Propofol per Anesthesia Complications:         No immediate complications. Procedure:             Pre-Anesthesia Assessment:                        - Prior to the procedure, a History and Physical was                         performed, and patient medications and allergies were                         reviewed. The patient's tolerance of previous                         anesthesia was also reviewed. The risks and benefits                         of the procedure and the sedation options and risks                         were discussed with the patient. All questions were                         answered, and informed consent was obtained. Prior                         Anticoagulants: The patient has taken no anticoagulant                         or antiplatelet agents. ASA Grade Assessment: II - A                         patient with mild systemic disease. After reviewing                         the risks and benefits, the patient was deemed in                         satisfactory condition to undergo the procedure.                        After obtaining informed consent, the colonoscope was                         passed under direct vision. Throughout the procedure,  the patient's blood pressure, pulse, and oxygen                         saturations were monitored continuously. The                         Colonoscope was introduced through the anus and                          advanced to the the cecum, identified by appendiceal                         orifice and ileocecal valve. The colonoscopy was                         performed without difficulty. The patient tolerated                         the procedure well. The quality of the bowel                         preparation was excellent. Findings:      The perianal and digital rectal examinations were normal.      A 6 mm polyp was found in the ascending colon. The polyp was sessile.       The polyp was removed with a cold snare. Resection and retrieval were       complete.      Two sessile polyps were found in the transverse colon. The polyps were 3       to 5 mm in size. These polyps were removed with a cold snare. Resection       and retrieval were complete.      Non-bleeding internal hemorrhoids were found during retroflexion. The       hemorrhoids were Grade I (internal hemorrhoids that do not prolapse).      Many small-mouthed diverticula were found in the entire colon. Impression:            - One 6 mm polyp in the ascending colon, removed with                         a cold snare. Resected and retrieved.                        - Two 3 to 5 mm polyps in the transverse colon,                         removed with a cold snare. Resected and retrieved.                        - Non-bleeding internal hemorrhoids.                        - Diverticulosis in the entire examined colon. Recommendation:        - Discharge patient to home.                        - Resume previous diet.                        -  Continue present medications.                        - Await pathology results.                        - If the pathology report reveals adenomatous tissue,                         then repeat the colonoscopy for surveillance in 5                         years. Procedure Code(s):     --- Professional ---                        [phone], Colonoscopy, flexible; with removal of                          tumor(s), polyp(s), or other lesion(s) by snare                         technique Diagnosis Code(s):     --- Professional ---                        Z12.11, Encounter for screening for malignant neoplasm                         of colon                        D12.3, Benign neoplasm of transverse colon (hepatic                         flexure or splenic flexure) CPT copyright 2022 American [employer] Association. All rights reserved. The codes documented in this report are preliminary and upon coder review may  be revised to meet current compliance requirements. Lucilla Lame MD, MD 05/28/2022 10:30:25 AM This report has been signed electronically. Number of Addenda: 0 Note Initiated On: 05/28/2022 10:11 AM Scope Withdrawal Time: 0 hours 8 minutes 28 seconds  Total Procedure Duration: 0 hours 12 minutes 34 seconds  Estimated Blood Loss:  Estimated blood loss: none. Estimated blood loss: none.      [facility]

## 2022-05-28 NOTE — H&P (Signed)
Lucilla Lame, MD [address] [place], [place] [address] [phone] Fax : [phone]  Primary Care Physician:  Valerie Roys, DO Primary Gastroenterologist:  Dr. Allen Norris  Pre-Procedure History & Physical: HPI:  Morgan Cisneros is a 57 y.o. female is here for an colonoscopy.   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/28/2016   Insomnia    LBP (low back pain) 12/13/2014   Ovarian cyst    Renal calculus, right    Renal calculus, right    Thoracic back pain    Right sided    Past Surgical History:  Procedure Laterality Date   BREAST BIOPSY Left 08/10/2016   [place] core, PASH   BREAST BIOPSY Right 01/17/2020   no clip due to pt condition during biopsy-neg   BREAST BIOPSY Right 02/07/2020   re-biopsy due to prior biopsy/ coil clip/ path pending   COLONOSCOPY WITH PROPOFOL N/A 04/25/2017   Procedure: COLONOSCOPY WITH PROPOFOL;  Surgeon: Lucilla Lame, MD;  Location: [facility];  Service: Endoscopy;  Laterality: N/A;   CYSTOSCOPY  11/26/2010   with stent placement   ESOPHAGOGASTRODUODENOSCOPY (EGD) WITH PROPOFOL N/A 04/25/2017   Procedure: ESOPHAGOGASTRODUODENOSCOPY (EGD) WITH PROPOFOL;  Surgeon: Lucilla Lame, MD;  Location: [facility];  Service: Endoscopy;  Laterality: N/A;   HEMORRHOID SURGERY     kidney stent     LAPAROSCOPIC APPENDECTOMY N/A 02/02/2017   Procedure: APPENDECTOMY LAPAROSCOPIC;  Surgeon: Clayburn Pert, MD;  Location: [facility];  Service: General;  Laterality: N/A;   LAPAROSCOPIC SIGMOID COLECTOMY N/A 11/01/2016    Procedure: LAPAROSCOPIC SIGMOID COLECTOMY;  Surgeon: Clayburn Pert, MD;  Location: [facility];  Service: General;  Laterality: N/A;   LITHOTRIPSY     X 4   POLYPECTOMY  04/25/2017   Procedure: POLYPECTOMY INTESTINAL;  Surgeon: Lucilla Lame, MD;  Location: [place];  Service: Endoscopy;;   TONSILLECTOMY     TOTAL ABDOMINAL HYSTERECTOMY  08/2013   TOTAL ABDOMINAL HYSTERECTOMY W/ BILATERAL SALPINGOOPHORECTOMY     TUBAL LIGATION     Ureteroscopy Right 11/21/1997   with holmium laser    Prior to Admission medications   Medication Sig Start Date End Date Taking? Authorizing Provider  albuterol (VENTOLIN HFA) 108 (90 Base) MCG/ACT inhaler Inhale 2 puffs into the lungs every 6 (six) hours as needed for wheezing or shortness of breath. 02/09/22  Yes Johnson, Megan P, DO  DULoxetine (CYMBALTA) 60 MG capsule TAKE 1 CAPSULE(60 MG) BY MOUTH DAILY 05/03/22  Yes Johnson, Megan P, DO  lisinopril (ZESTRIL) 20 MG tablet Take 1 tablet (20 mg total) by mouth daily. 05/03/22  Yes Johnson, Megan P, DO  metoprolol succinate (TOPROL-XL) 100 MG 24 hr tablet TAKE 1 TABLET(100 MG) BY MOUTH DAILY 01/14/22  Yes Johnson, Megan P, DO  rosuvastatin (CRESTOR) 10 MG tablet Take 1 tablet (10 mg total) by mouth daily. 05/05/22  Yes Johnson, Megan P, DO  LORazepam (ATIVAN) 0.5 MG tablet Take 1 tablet (0.5 mg total) by mouth 2 (two) times daily as needed for anxiety. Patient  not taking: Reported on 05/25/2022 01/14/22   Park Liter P, DO  sucralfate (CARAFATE) 1 g tablet Take 1 tablet (1 g total) by mouth 4 (four) times daily -  with meals and at bedtime. Patient not taking: Reported on 05/25/2022 10/01/21   Park Liter P, DO    Allergies as of 05/04/2022 - Review Complete 05/03/2022  Allergen Reaction Noted   Wellbutrin [bupropion] Other (See Comments) 06/05/2018   Urocit-k [potassium citrate] Nausea Only 10/22/2014    Family History  Problem Relation Age of Onset   Kidney Stones Mother    Ovarian cancer  Mother    Colon cancer Mother    Lung cancer Father    Hyperlipidemia Sister    Hypertension Sister    Hypertension Sister    Hyperlipidemia Sister    Alzheimer's disease Maternal Grandmother    Cancer Maternal Grandfather        Liver   Alzheimer's disease Paternal Grandfather    Breast cancer Neg Hx     Social History   Socioeconomic History   Marital status: Widowed    Spouse name: Not on file   Number of children: Not on file   Years of education: Not on file   Highest education level: Not on file  Occupational History   Not on file  Tobacco Use   Smoking status: Every Day    Packs/day: 0.50    Types: Cigarettes   Smokeless tobacco: Never  Vaping Use   Vaping Use: Never used  Substance and Sexual Activity   Alcohol use: Yes    Alcohol/week: 0.0 standard drinks of alcohol    Comment: occasional   Drug use: No   Sexual activity: Not Currently    Birth control/protection: Surgical  Other Topics Concern   Not on file  Social History Narrative   Not on file   Social Determinants of Health   Financial Resource Strain: Not on file  Food Insecurity: Not on file  Transportation Needs: Not on file  Physical Activity: Not on file  Stress: Not on file  Social Connections: Not on file  Intimate Partner Violence: Not on file    Review of Systems: See HPI, otherwise negative ROS  Physical Exam: BP 125/80   Pulse 99   Temp 98.2 F (36.8 C) (Temporal)   Resp 20   Ht '5\' 5"'$  (1.651 m)   Wt 64 kg   SpO2 95%   BMI 23.46 kg/m  General:   Alert,  pleasant and cooperative in NAD Head:  Normocephalic and atraumatic. Neck:  Supple; no masses or thyromegaly. Lungs:  Clear throughout to auscultation.    Heart:  Regular rate and rhythm. Abdomen:  Soft, nontender and nondistended. Normal bowel sounds, without guarding, and without rebound.   Neurologic:  Alert and  oriented x4;  grossly normal neurologically.  Impression/Plan: Morgan Cisneros is here for an  colonoscopy to be performed for a history of adenomatous polyps on 2019   Risks, benefits, limitations, and alternatives regarding  colonoscopy have been reviewed with the patient.  Questions have been answered.  All parties agreeable.   Lucilla Lame, MD  05/28/2022, 9:45 AM

## 2022-05-31 ENCOUNTER — Encounter: Payer: Self-pay | Admitting: Gastroenterology

## 2022-06-01 LAB — SURGICAL PATHOLOGY

## 2022-06-05 ENCOUNTER — Encounter: Payer: Self-pay | Admitting: Gastroenterology

## 2022-07-28 ENCOUNTER — Encounter: Payer: Self-pay | Admitting: Family Medicine

## 2022-07-28 ENCOUNTER — Other Ambulatory Visit: Payer: Self-pay | Admitting: Family Medicine

## 2022-07-28 MED ORDER — METOPROLOL SUCCINATE ER 100 MG PO TB24: ORAL_TABLET | ORAL | 1 refills | Status: DC

## 2022-07-28 MED ORDER — ROSUVASTATIN CALCIUM 10 MG PO TABS: 10.0000 mg | ORAL_TABLET | Freq: Every day | ORAL | 1 refills | Status: DC

## 2022-09-16 ENCOUNTER — Ambulatory Visit: Payer: BC Managed Care – PPO | Admitting: Physician Assistant

## 2022-09-30 ENCOUNTER — Encounter: Payer: Self-pay | Admitting: Family Medicine

## 2022-09-30 ENCOUNTER — Telehealth (INDEPENDENT_AMBULATORY_CARE_PROVIDER_SITE_OTHER): Payer: BC Managed Care – PPO | Admitting: Family Medicine

## 2022-09-30 ENCOUNTER — Ambulatory Visit: Payer: BC Managed Care – PPO | Admitting: Family Medicine

## 2022-09-30 DIAGNOSIS — J020 Streptococcal pharyngitis: Secondary | ICD-10-CM | POA: Diagnosis not present

## 2022-09-30 MED ORDER — AMOXICILLIN-POT CLAVULANATE 875-125 MG PO TABS: 1.0000 | ORAL_TABLET | Freq: Two times a day (BID) | ORAL | 0 refills | Status: DC

## 2022-09-30 MED ORDER — HYDROCOD POLI-CHLORPHE POLI ER 10-8 MG/5ML PO SUER: 5.0000 mL | Freq: Two times a day (BID) | ORAL | 0 refills | Status: DC | PRN

## 2022-09-30 NOTE — Progress Notes (Signed)
There were no vitals taken for this visit.   Subjective:    Patient ID: Morgan Cisneros, female    DOB: Feb 13, 1966, 57 y.o.   MRN: [id number]  HPI: Morgan Cisneros is a 57 y.o. female  Chief Complaint  Patient presents with   Sore Throat    Pt states she has been around family that had strep started feeling bad on Sunday has been taking OTC drugs but hasn't got much relief   UPPER RESPIRATORY TRACT INFECTION Duration: 4 days Worst symptom: sore throat Fever: no, but chills and sweats Cough: yes Shortness of breath: no Wheezing: no Chest pain: no Chest tightness: no Chest congestion: yes Nasal congestion: yes Runny nose: yes Post nasal drip: yes Sneezing: no Sore throat: yes Swollen glands: yes Sinus pressure: yes Headache: yes Face pain: no Toothache: yes Ear pain: no  Ear pressure: no  Eyes red/itching:no Eye drainage/crusting: no  Vomiting: no Rash: no Fatigue: yes Sick contacts: yes Strep contacts: yes  Context: worse Recurrent sinusitis: no Relief with OTC cold/cough medications: no  Treatments attempted: none   Relevant past medical, surgical, family and social history reviewed and updated as indicated. Interim medical history since our last visit reviewed. Allergies and medications reviewed and updated.  Review of Systems  Constitutional:  Positive for chills, diaphoresis and fatigue. Negative for activity change, appetite change, fever and unexpected weight change.  HENT:  Positive for congestion, ear pain, postnasal drip and sore throat. Negative for dental problem, drooling, ear discharge, facial swelling, hearing loss, mouth sores, nosebleeds, rhinorrhea, sinus pressure, sinus pain, sneezing, tinnitus, trouble swallowing and voice change.   Eyes: Negative.   Respiratory: Negative.    Cardiovascular: Negative.   Gastrointestinal: Negative.   Musculoskeletal: Negative.   Neurological: Negative.   Psychiatric/Behavioral: Negative.      Per  HPI unless specifically indicated above     Objective:    There were no vitals taken for this visit.  Wt Readings from Last 3 Encounters:  05/28/22 141 lb (64 kg)  05/03/22 146 lb 8 oz (66.5 kg)  01/14/22 142 lb 4.8 oz (64.5 kg)    Physical Exam Vitals and nursing note reviewed.  Constitutional:      General: She is not in acute distress.    Appearance: Normal appearance. She is well-developed and normal weight. She is ill-appearing. She is not toxic-appearing or diaphoretic.  HENT:     Head: Normocephalic and atraumatic.     Right Ear: External ear normal.     Left Ear: External ear normal.     Nose: Nose normal.     Mouth/Throat:     Mouth: Mucous membranes are moist.     Pharynx: Oropharynx is clear.  Eyes:     General: No scleral icterus.       Right eye: No discharge.        Left eye: No discharge.     Conjunctiva/sclera: Conjunctivae normal.     Pupils: Pupils are equal, round, and reactive to light.  Pulmonary:     Effort: Pulmonary effort is normal. No respiratory distress.     Comments: Speaking in full sentences Musculoskeletal:        General: Normal range of motion.     Cervical back: Normal range of motion.  Skin:    Coloration: Skin is not jaundiced or pale.     Findings: No bruising, erythema, lesion or rash.  Neurological:     Mental Status: She is alert and oriented  to person, place, and time. Mental status is at baseline.  Psychiatric:        Mood and Affect: Mood normal.        Behavior: Behavior normal.        Thought Content: Thought content normal.        Judgment: Judgment normal.     Results for orders placed or performed during the hospital encounter of 05/28/22  Surgical pathology  Result Value Ref Range   SURGICAL PATHOLOGY      SURGICAL PATHOLOGY CASE: [id number] PATIENT: [name] Surgical Pathology Report     Specimen Submitted: A. Colon polyp, ascending; cold snare B. Colon polyp x2, transverse; cold  snare  Clinical History: Mother colon cancer, personal history of colon polyps. Colon polyps      DIAGNOSIS: A.  COLON POLYP, ASCENDING; COLD SNARE: - TUBULAR ADENOMA. - NEGATIVE FOR HIGH-GRADE DYSPLASIA AND MALIGNANCY.  B.  COLON POLYP X 2, TRANSVERSE; COLD SNARE: - HYPERPLASTIC POLYP (2). - NEGATIVE FOR DYSPLASIA AND MALIGNANCY.  GROSS DESCRIPTION: A. Labeled: Ascending colon polyp cold snare Received: Formalin Collection time: 10:14 AM on 05/28/2022 Placed into formalin time: 10:14 AM on 05/28/2022 Tissue fragment(s): 3 Size: Aggregate, 0.6 x 0.4 x 0.1 cm Description: Tan soft tissue fragments Entirely submitted in 1 cassette.  B. Labeled: Transverse colon polyp x 2 cold snare Received: Formalin Collection time: 10:21 AM on 05/28/2022 Placed into formalin time: 1 0:21 AM on 05/28/2022 Tissue fragment(s): Multiple Size: Aggregate, 1.4 x 0.6 x 0.2 cm Description: Received are fragments of tan-pink soft tissue.  The largest fragment has a resection margin which is inked green.  This fragment is serially sectioned. Entirely submitted in cassettes 1-2 with the serially sectioned fragment in cassette 1 and the remaining fragments in cassette 2.  RB 05/31/2022   Final Diagnosis performed by Elijah Birk, MD.   Electronically signed 06/01/2022 9:17:17AM The electronic signature indicates that the named Attending Pathologist has evaluated the specimen Technical component performed at [facility], [address] [place], [place] [address] Lab: [phone] Dir: Jolene Schimke, MD, MMM  Professional component performed at [facility], [facility], [address] [place], [place], [place] [address] Lab: [phone] Dir: Beryle Quant, MD       Assessment & Plan:   Problem List Items Addressed This Visit   None Visit Diagnoses     Strep throat    -  Primary   Will check COVID test at home, but will treat for strep given exposures. Call if not getting better or  getting worse.        Follow up plan: Return if symptoms worsen or fail to improve.     This visit was completed via video visit through MyChart due to the restrictions of the COVID-19 pandemic. All issues as above were discussed and addressed. Physical exam was done as above through visual confirmation on video through MyChart. If it was felt that the patient should be evaluated in the office, they were directed there. The patient verbally consented to this visit. Location of the patient: home Location of the provider: work Those involved with this call:  Provider: Olevia Perches, DO CMA: Wilhemena Durie, CMA Front Desk/Registration:  Servando Snare   Time spent on call:  15 minutes with patient face to face via video conference. More than 50% of this time was spent in counseling and coordination of care. 23 minutes total spent in review of patient's record and preparation of their chart.

## 2022-10-21 ENCOUNTER — Encounter: Payer: Self-pay | Admitting: Family Medicine

## 2022-10-22 MED ORDER — DULOXETINE HCL 60 MG PO CPEP: ORAL_CAPSULE | ORAL | 1 refills | Status: DC

## 2022-10-22 MED ORDER — LISINOPRIL 20 MG PO TABS: 20.0000 mg | ORAL_TABLET | Freq: Every day | ORAL | 1 refills | Status: DC

## 2022-10-23 ENCOUNTER — Other Ambulatory Visit: Payer: Self-pay | Admitting: Family Medicine

## 2022-10-25 NOTE — Telephone Encounter (Signed)
Rx 07/28/22 #90 1RF- too soon Requested Prescriptions  Pending Prescriptions Disp Refills   metoprolol succinate (TOPROL-XL) 100 MG 24 hr tablet [Pharmacy Med Name: METOPROLOL SUCCINATE ER 100 MG TAB] 90 tablet 1    Sig: TAKE 1 TABLET BY MOUTH ONCE DAILY     Cardiovascular:  Beta Blockers Passed - 10/23/2022 12:33 PM      Passed - Last BP in normal range    BP Readings from Last 1 Encounters:  05/28/22 96/73         Passed - Last Heart Rate in normal range    Pulse Readings from Last 1 Encounters:  05/28/22 80         Passed - Valid encounter within last 6 months    Recent Outpatient Visits           3 weeks ago Strep throat   [place] [facility] [place], [place], DO   5 months ago Essential hypertension   [place] [facility] [place], [place], DO   6 months ago COVID-19   [facility] [place], DO   8 months ago Upper respiratory tract infection, unspecified type   [place] [facility] [place], Megan P, DO   9 months ago Routine general medical examination at a health care facility   [facility] [place], Oralia Rud, DO       Future Appointments             In 1 week Dorcas Carrow, DO  [facility]

## 2022-11-01 ENCOUNTER — Ambulatory Visit: Payer: BC Managed Care – PPO | Admitting: Family Medicine

## 2022-11-26 ENCOUNTER — Ambulatory Visit: Payer: BC Managed Care – PPO | Admitting: Family Medicine

## 2022-11-26 ENCOUNTER — Ambulatory Visit: Payer: Self-pay | Admitting: *Deleted

## 2022-11-26 ENCOUNTER — Encounter: Payer: Self-pay | Admitting: Family Medicine

## 2022-11-26 VITALS — BP 152/98 | HR 77 | Wt 151.0 lb

## 2022-11-26 DIAGNOSIS — I10 Essential (primary) hypertension: Secondary | ICD-10-CM | POA: Diagnosis not present

## 2022-11-26 DIAGNOSIS — F419 Anxiety disorder, unspecified: Secondary | ICD-10-CM

## 2022-11-26 DIAGNOSIS — F339 Major depressive disorder, recurrent, unspecified: Secondary | ICD-10-CM | POA: Diagnosis not present

## 2022-11-26 MED ORDER — LORAZEPAM 0.5 MG PO TABS: 0.5000 mg | ORAL_TABLET | Freq: Two times a day (BID) | ORAL | 2 refills | Status: DC | PRN

## 2022-11-26 MED ORDER — METOPROLOL SUCCINATE ER 100 MG PO TB24: ORAL_TABLET | ORAL | 1 refills | Status: DC

## 2022-11-26 MED ORDER — LISINOPRIL 30 MG PO TABS: 30.0000 mg | ORAL_TABLET | Freq: Every day | ORAL | 2 refills | Status: DC

## 2022-11-26 NOTE — Telephone Encounter (Signed)
  Chief Complaint: elevated BP , headache, anxiety  Symptoms: went to ED yesterday due to elevated BP 213/117. This am prior to taking BP medication , BP 152/112. Headache , nose bleed lasted 3 minutes . Patient did take aspirin. Has not taken am lisinopril or toprol XL this am. Rechecked BP 148/97 HR 77 now. Reports feeling anxious  Frequency: yesterday  Pertinent Negatives: Patient denies chest pain no difficulty breathing no report of weakness on either side no blurred vision  Disposition: [] ED /[] Urgent Care (no appt availability in office) / [] Appointment(In office/virtual)/ []  [facility]/ [] Home Care/ [] Refused Recommended Disposition /[] [facility]/ [x]  Follow-up with PCP Additional Notes:   Appt already scheduled for 11/30/22. Patient requesting anxiety medication refill.  Recommended patient take HTN medication and wait 1 hour recheck BP and if continues to be elevated to call back. No available appt today in office. Please advise . Patient would like a call back if she should increase medication .     Reason for Disposition  Systolic BP  >= 160 OR Diastolic >= 100  Answer Assessment - Initial Assessment Questions 1. BLOOD PRESSURE: "What is the blood pressure?" "Did you take at least two measurements 5 minutes apart?"     BP 152/112 rechecked for 148/97 HR 77 2. ONSET: "When did you take your blood pressure?"     This am  3. HOW: "How did you take your blood pressure?" (e.g., automatic home BP monitor, visiting nurse)     Automatic BP home monitor   4. HISTORY: "Do you have a history of high blood pressure?"     Yes  5. MEDICINES: "Are you taking any medicines for blood pressure?" "Have you missed any doses recently?"     Yes has not taken medication this am  6. OTHER SYMPTOMS: "Do you have any symptoms?" (e.g., blurred vision, chest pain, difficulty breathing, headache, weakness)     Headache, nose bleeding this am but now stopped lasted 3 minutes  anxiety   7. PREGNANCY: "Is there any chance you are pregnant?" "When was your last menstrual period?"     na  Protocols used: Blood Pressure - High-A-AH

## 2022-11-26 NOTE — Progress Notes (Signed)
BP (!) 152/98   Pulse 77   Wt 151 lb (68.5 kg)   SpO2 98%   BMI 25.13 kg/m    Subjective:    Patient ID: Morgan Cisneros, female    DOB: [birth year], 57 y.o.   MRN: [id number]  HPI: Morgan Cisneros is a 57 y.o. female  Chief Complaint  Patient presents with   Hypertension   ER FOLLOW UP Time since discharge: 1 day Hospital/facility: [facility] Diagnosis: HTN Procedures/tests: labs, CXR  Consultants: none New medications: none Discharge instructions: follow up here  Status: better  HYPERTENSION  Hypertension status: uncontrolled  Satisfied with current treatment? no Duration of hypertension: chronic BP monitoring frequency:  rarely BP medication side effects:  no Medication compliance: excellent compliance Previous BP meds:metoprolol, lisinopril Aspirin: no Recurrent headaches: no Visual changes: no Palpitations: no Dyspnea: no Chest pain: no Lower extremity edema: no Dizzy/lightheaded: no  ANXIETY/DEPRESSION Duration: chronic Status:exacerbated Anxious mood: yes  Excessive worrying: yes Irritability: no  Sweating: no Nausea: no Palpitations:yes Hyperventilation: yes Panic attacks: yes Agoraphobia: no  Obscessions/compulsions: no Depressed mood: yes    05/03/2022    4:18 PM 01/14/2022    3:43 PM 11/27/2021   11:35 AM 10/19/2021    9:00 AM 10/01/2021    4:26 PM  Depression screen PHQ 2/9  Decreased Interest 1 2 1 3 2   Down, Depressed, Hopeless 1 2 2 3 2   PHQ - 2 Score 2 4 3 6 4   Altered sleeping 1 3 2 2 2   Tired, decreased energy 1 2 2 2 2   Change in appetite 1 2 0 0 2  Feeling bad or failure about yourself  1 2 1 3 2   Trouble concentrating 2 2 0 3 2  Moving slowly or fidgety/restless 0 0 0 0 0  Suicidal thoughts 0 0 0 0 0  PHQ-9 Score 8 15 8 16 14   Difficult doing work/chores Not difficult at all   Somewhat difficult Somewhat difficult      05/03/2022    4:20 PM 01/14/2022    3:43 PM 11/27/2021   11:35 AM 10/19/2021    9:00 AM   GAD 7 : Generalized Anxiety Score  Nervous, Anxious, on Edge 1 2 2 3   Control/stop worrying 1 2 2 3   Worry too much - different things 1 2 2 3   Trouble relaxing 1 3 2 3   Restless 0 3 0 1  Easily annoyed or irritable 0 0 0 0  Afraid - awful might happen 1 2 1 3   Total GAD 7 Score 5 14 9 16   Anxiety Difficulty Somewhat difficult   Somewhat difficult   Anhedonia: no Weight changes: no Insomnia: no   Hypersomnia: no Fatigue/loss of energy: yes Feelings of worthlessness: no Feelings of guilt: yes Impaired concentration/indecisiveness: no Suicidal ideations: no  Crying spells: yes Recent Stressors/Life Changes: yes   Relationship problems: no   Family stress: no     Financial stress: no    Job stress: no    Recent death/loss: yes  Relevant past medical, surgical, family and social history reviewed and updated as indicated. Interim medical history since our last visit reviewed. Allergies and medications reviewed and updated.  Review of Systems  Constitutional: Negative.   Respiratory: Negative.    Cardiovascular: Negative.   Gastrointestinal: Negative.   Musculoskeletal: Negative.   Skin: Negative.   Neurological: Negative.   Psychiatric/Behavioral:  Positive for dysphoric mood. Negative for agitation, behavioral problems, confusion, decreased concentration, hallucinations, self-injury, sleep  disturbance and suicidal ideas. The patient is nervous/anxious. The patient is not hyperactive.     Per HPI unless specifically indicated above     Objective:    BP (!) 152/98   Pulse 77   Wt 151 lb (68.5 kg)   SpO2 98%   BMI 25.13 kg/m   Wt Readings from Last 3 Encounters:  11/26/22 151 lb (68.5 kg)  05/28/22 141 lb (64 kg)  05/03/22 146 lb 8 oz (66.5 kg)    Physical Exam Vitals and nursing note reviewed.  Constitutional:      General: She is not in acute distress.    Appearance: Normal appearance. She is not ill-appearing, toxic-appearing or diaphoretic.  HENT:      Head: Normocephalic and atraumatic.     Right Ear: External ear normal.     Left Ear: External ear normal.     Nose: Nose normal.     Mouth/Throat:     Mouth: Mucous membranes are moist.     Pharynx: Oropharynx is clear.  Eyes:     General: No scleral icterus.       Right eye: No discharge.        Left eye: No discharge.     Extraocular Movements: Extraocular movements intact.     Conjunctiva/sclera: Conjunctivae normal.     Pupils: Pupils are equal, round, and reactive to light.  Cardiovascular:     Rate and Rhythm: Normal rate and regular rhythm.     Pulses: Normal pulses.     Heart sounds: Normal heart sounds. No murmur heard.    No friction rub. No gallop.  Pulmonary:     Effort: Pulmonary effort is normal. No respiratory distress.     Breath sounds: Normal breath sounds. No stridor. No wheezing, rhonchi or rales.  Chest:     Chest wall: No tenderness.  Musculoskeletal:        General: Normal range of motion.     Cervical back: Normal range of motion and neck supple.  Skin:    General: Skin is warm and dry.     Capillary Refill: Capillary refill takes less than 2 seconds.     Coloration: Skin is not jaundiced or pale.     Findings: No bruising, erythema, lesion or rash.  Neurological:     General: No focal deficit present.     Mental Status: She is alert and oriented to person, place, and time. Mental status is at baseline.  Psychiatric:        Mood and Affect: Mood normal. Affect is tearful.        Behavior: Behavior normal.        Thought Content: Thought content normal.        Judgment: Judgment normal.     Results for orders placed or performed during the hospital encounter of 05/28/22  Surgical pathology  Result Value Ref Range   SURGICAL PATHOLOGY      SURGICAL PATHOLOGY CASE: [id number] PATIENT: Morgan Cisneros Surgical Pathology Report     Specimen Submitted: A. Colon polyp, ascending; cold snare B. Colon polyp x2, transverse; cold  snare  Clinical History: Mother colon cancer, personal history of colon polyps. Colon polyps      DIAGNOSIS: A.  COLON POLYP, ASCENDING; COLD SNARE: - TUBULAR ADENOMA. - NEGATIVE FOR HIGH-GRADE DYSPLASIA AND MALIGNANCY.  B.  COLON POLYP X 2, TRANSVERSE; COLD SNARE: - HYPERPLASTIC POLYP (2). - NEGATIVE FOR DYSPLASIA AND MALIGNANCY.  GROSS DESCRIPTION: A. Labeled: Ascending colon polyp cold  snare Received: Formalin Collection time: 10:14 AM on 05/28/2022 Placed into formalin time: 10:14 AM on 05/28/2022 Tissue fragment(s): 3 Size: Aggregate, 0.6 x 0.4 x 0.1 cm Description: Tan soft tissue fragments Entirely submitted in 1 cassette.  B. Labeled: Transverse colon polyp x 2 cold snare Received: Formalin Collection time: 10:21 AM on 05/28/2022 Placed into formalin time: 1 0:21 AM on 05/28/2022 Tissue fragment(s): Multiple Size: Aggregate, 1.4 x 0.6 x 0.2 cm Description: Received are fragments of tan-pink soft tissue.  The largest fragment has a resection margin which is inked green.  This fragment is serially sectioned. Entirely submitted in cassettes 1-2 with the serially sectioned fragment in cassette 1 and the remaining fragments in cassette 2.  RB 05/31/2022   Final Diagnosis performed by Elijah Birk, MD.   Electronically signed 06/01/2022 9:17:17AM The electronic signature indicates that the named Attending Pathologist has evaluated the specimen Technical component performed at [facility], [address] [place], [place] [address] Lab: [phone] Dir: Jolene Schimke, MD, MMM  Professional component performed at [facility], [facility], [address] [place], [place], [place] [address] Lab: [phone] Dir: Beryle Quant, MD       Assessment & Plan:   Problem List Items Addressed This Visit       Cardiovascular and Mediastinum   Essential hypertension - Primary    Running high. Will increase her lisinopril to 30mg  and recheck in about 7 weeks at  physical. Call with any concerns.       Relevant Medications   lisinopril (ZESTRIL) 30 MG tablet   metoprolol succinate (TOPROL-XL) 100 MG 24 hr tablet     Other   Anxiety    In exacerbation around the time of her husbands death. Will give her a refill on her lorazepam and recheck in about 7 weeks at her physical. Call with any concerns.       Relevant Medications   LORazepam (ATIVAN) 0.5 MG tablet   Depression, recurrent (HCC)    In exacerbation around the time of her husbands death. Will give her a refill on her lorazepam and recheck in about 7 weeks at her physical. Call with any concerns.       Relevant Medications   LORazepam (ATIVAN) 0.5 MG tablet     Follow up plan: Return in about 7 weeks (around 01/17/2023).

## 2022-11-26 NOTE — Telephone Encounter (Signed)
I can see her today if she can get here before 4:30

## 2022-11-26 NOTE — Assessment & Plan Note (Signed)
In exacerbation around the time of her husbands death. Will give her a refill on her lorazepam and recheck in about 7 weeks at her physical. Call with any concerns.

## 2022-11-26 NOTE — Telephone Encounter (Signed)
Pt called back. Pt has not heard from Dr. Laural Benes, and would like a call back today regarding BP and anxiety medication. PT states that BP was 151/103 awhile ago.

## 2022-11-26 NOTE — Assessment & Plan Note (Signed)
Running high. Will increase her lisinopril to 30mg  and recheck in about 7 weeks at physical. Call with any concerns.

## 2022-11-26 NOTE — Telephone Encounter (Signed)
Patient will arrive before 4:30

## 2022-11-29 NOTE — Telephone Encounter (Signed)
No further action needed. Rx sent on 08/23

## 2022-11-30 ENCOUNTER — Ambulatory Visit: Payer: BC Managed Care – PPO | Admitting: Family Medicine

## 2022-12-02 ENCOUNTER — Encounter: Payer: Self-pay | Admitting: Family Medicine

## 2022-12-27 ENCOUNTER — Encounter: Payer: Self-pay | Admitting: Family Medicine

## 2022-12-30 ENCOUNTER — Ambulatory Visit (INDEPENDENT_AMBULATORY_CARE_PROVIDER_SITE_OTHER): Payer: BC Managed Care – PPO | Admitting: Family Medicine

## 2022-12-30 ENCOUNTER — Ambulatory Visit: Payer: Self-pay

## 2022-12-30 ENCOUNTER — Encounter: Payer: Self-pay | Admitting: Family Medicine

## 2022-12-30 VITALS — BP 146/91 | HR 94 | Temp 98.5°F | Ht 65.0 in | Wt 150.8 lb

## 2022-12-30 DIAGNOSIS — Z1231 Encounter for screening mammogram for malignant neoplasm of breast: Secondary | ICD-10-CM | POA: Diagnosis not present

## 2022-12-30 DIAGNOSIS — Z23 Encounter for immunization: Secondary | ICD-10-CM

## 2022-12-30 DIAGNOSIS — I10 Essential (primary) hypertension: Secondary | ICD-10-CM | POA: Diagnosis not present

## 2022-12-30 MED ORDER — LISINOPRIL 40 MG PO TABS: 40.0000 mg | ORAL_TABLET | Freq: Every day | ORAL | Status: DC

## 2022-12-30 NOTE — Assessment & Plan Note (Signed)
Still running high. Will increase her lisinopril to 40mg  and recheck at her physical in October. Call with any concerns.

## 2022-12-30 NOTE — Progress Notes (Signed)
BP (!) 146/91   Pulse 94   Temp 98.5 F (36.9 C) (Oral)   Ht 5\' 5"  (1.651 m)   Wt 150 lb 12.8 oz (68.4 kg)   SpO2 96%   BMI 25.09 kg/m    Subjective:    Patient ID: Morgan Cisneros, female    DOB: December 19, 1965, 57 y.o.   MRN: [id number]  HPI: Morgan Cisneros is a 57 y.o. female  Chief Complaint  Patient presents with   Hypertension   HYPERTENSION  Hypertension status: uncontrolled  Satisfied with current treatment? no Duration of hypertension: chronic BP monitoring frequency:  not checking BP medication side effects:  no Medication compliance: excellent compliance Previous BP meds: lisinopril Aspirin: no Recurrent headaches: yes Visual changes: no Palpitations: yes Dyspnea: no Chest pain: no Lower extremity edema: no Dizzy/lightheaded: no  Relevant past medical, surgical, family and social history reviewed and updated as indicated. Interim medical history since our last visit reviewed. Allergies and medications reviewed and updated.  Review of Systems  Constitutional: Negative.   Respiratory: Negative.    Cardiovascular: Negative.   Gastrointestinal: Negative.   Musculoskeletal: Negative.   Psychiatric/Behavioral: Negative.      Per HPI unless specifically indicated above     Objective:    BP (!) 146/91   Pulse 94   Temp 98.5 F (36.9 C) (Oral)   Ht 5\' 5"  (1.651 m)   Wt 150 lb 12.8 oz (68.4 kg)   SpO2 96%   BMI 25.09 kg/m   Wt Readings from Last 3 Encounters:  12/30/22 150 lb 12.8 oz (68.4 kg)  11/26/22 151 lb (68.5 kg)  05/28/22 141 lb (64 kg)    Physical Exam Vitals and nursing note reviewed.  Constitutional:      General: She is not in acute distress.    Appearance: Normal appearance. She is normal weight. She is not ill-appearing, toxic-appearing or diaphoretic.  HENT:     Head: Normocephalic and atraumatic.     Right Ear: External ear normal.     Left Ear: External ear normal.     Nose: Nose normal.     Mouth/Throat:      Mouth: Mucous membranes are moist.     Pharynx: Oropharynx is clear.  Eyes:     General: No scleral icterus.       Right eye: No discharge.        Left eye: No discharge.     Extraocular Movements: Extraocular movements intact.     Conjunctiva/sclera: Conjunctivae normal.     Pupils: Pupils are equal, round, and reactive to light.  Cardiovascular:     Rate and Rhythm: Normal rate and regular rhythm.     Pulses: Normal pulses.     Heart sounds: Normal heart sounds. No murmur heard.    No friction rub. No gallop.  Pulmonary:     Effort: Pulmonary effort is normal. No respiratory distress.     Breath sounds: Normal breath sounds. No stridor. No wheezing, rhonchi or rales.  Chest:     Chest wall: No tenderness.  Musculoskeletal:        General: Normal range of motion.     Cervical back: Normal range of motion and neck supple.  Skin:    General: Skin is warm and dry.     Capillary Refill: Capillary refill takes less than 2 seconds.     Coloration: Skin is not jaundiced or pale.     Findings: No bruising, erythema, lesion or rash.  Neurological:     General: No focal deficit present.     Mental Status: She is alert and oriented to person, place, and time. Mental status is at baseline.  Psychiatric:        Mood and Affect: Mood normal.        Behavior: Behavior normal.        Thought Content: Thought content normal.        Judgment: Judgment normal.     Results for orders placed or performed during the hospital encounter of 05/28/22  Surgical pathology  Result Value Ref Range   SURGICAL PATHOLOGY      SURGICAL PATHOLOGY CASE: [id number] PATIENT: [name] Surgical Pathology Report     Specimen Submitted: A. Colon polyp, ascending; cold snare B. Colon polyp x2, transverse; cold snare  Clinical History: Mother colon cancer, personal history of colon polyps. Colon polyps      DIAGNOSIS: A.  COLON POLYP, ASCENDING; COLD SNARE: - TUBULAR ADENOMA. - NEGATIVE  FOR HIGH-GRADE DYSPLASIA AND MALIGNANCY.  B.  COLON POLYP X 2, TRANSVERSE; COLD SNARE: - HYPERPLASTIC POLYP (2). - NEGATIVE FOR DYSPLASIA AND MALIGNANCY.  GROSS DESCRIPTION: A. Labeled: Ascending colon polyp cold snare Received: Formalin Collection time: 10:14 AM on 05/28/2022 Placed into formalin time: 10:14 AM on 05/28/2022 Tissue fragment(s): 3 Size: Aggregate, 0.6 x 0.4 x 0.1 cm Description: Tan soft tissue fragments Entirely submitted in 1 cassette.  B. Labeled: Transverse colon polyp x 2 cold snare Received: Formalin Collection time: 10:21 AM on 05/28/2022 Placed into formalin time: 1 0:21 AM on 05/28/2022 Tissue fragment(s): Multiple Size: Aggregate, 1.4 x 0.6 x 0.2 cm Description: Received are fragments of tan-pink soft tissue.  The largest fragment has a resection margin which is inked green.  This fragment is serially sectioned. Entirely submitted in cassettes 1-2 with the serially sectioned fragment in cassette 1 and the remaining fragments in cassette 2.  RB 05/31/2022   Final Diagnosis performed by Elijah Birk, MD.   Electronically signed 06/01/2022 9:17:17AM The electronic signature indicates that the named Attending Pathologist has evaluated the specimen Technical component performed at [facility], [address] [place], [place] [address] Lab: [phone] Dir: Jolene Schimke, MD, MMM  Professional component performed at [facility], [facility], [address] [place], [place], [place] [address] Lab: [phone] Dir: Beryle Quant, MD       Assessment & Plan:   Problem List Items Addressed This Visit       Cardiovascular and Mediastinum   Essential hypertension - Primary    Still running high. Will increase her lisinopril to 40mg  and recheck at her physical in October. Call with any concerns.       Relevant Medications   lisinopril (ZESTRIL) 40 MG tablet   Other Visit Diagnoses     Encounter for screening mammogram for malignant neoplasm of  breast       Mammo ordered today- due in January   Relevant Orders   MM 3D SCREENING MAMMOGRAM BILATERAL BREAST   Need for influenza vaccination       Flu shot given today.   Relevant Orders   Flu vaccine trivalent PF, 6mos and older(Flulaval,Afluria,Fluarix,Fluzone)        Follow up plan: Return As scheduled. OK to cancel appt next week.

## 2022-12-30 NOTE — Telephone Encounter (Signed)
Chief Complaint: Elevated blood pressure  Symptoms: headache, lightheaded sometimes Frequency: comes and goes  Pertinent Negatives: Patient denies chest pain, weakness, nausea, vomiting Disposition: [] ED /[] Urgent Care (no appt availability in office) / [x] Appointment(In office/virtual)/ []  [facility]/ [] Home Care/ [] Refused Recommended Disposition /[] [facility]/ []  Follow-up with PCP Additional Notes: Patient stated her lisinopril medication dose was  changed to 30mg  about 3 weeks ago, her blood pressure was going down but about 2 weeks ago she noticed it was going back up. Patient stated her blood pressure was 171/110 yesterday. Patient reports headaches and lightheadedness when the blood pressure is elevated. Patient reports blood pressure today was 150/100 20 mins ago and 150/103 now taking with a home monitor. Patient reports headache improves when she takes over the counter tylenol. Care advice was given and patient has been scheduled to see PCP at first available appointment via MyChart video visit on 01/07/23. Patient wanted to know if PCP would see her sooner. Advised I would forward message to PCP.  Reason for Disposition  [1] Systolic BP  >= 130 OR Diastolic >= 80 AND [2] taking BP medications  Answer Assessment - Initial Assessment Questions 1. BLOOD PRESSURE: "What is the blood pressure?" "Did you take at least two measurements 5 minutes apart?"     ago 150/100, now 150/103 2. ONSET: "When did you take your blood pressure?"     About 20 mins ago  3. HOW: "How did you take your blood pressure?" (e.g., automatic home BP monitor, visiting nurse)     Automatic home BP monitor upper arm  4. HISTORY: "Do you have a history of high blood pressure?"     Yes 5. MEDICINES: "Are you taking any medicines for blood pressure?" "Have you missed any doses recently?"     Lisinopril 30mg , Metoprolol 100mg   6. OTHER SYMPTOMS: "Do you have any symptoms?" (e.g., blurred  vision, chest pain, difficulty breathing, headache, weakness)     Headache, lightheaded  Protocols used: Blood Pressure - High-A-AH

## 2022-12-30 NOTE — Telephone Encounter (Signed)
Appointment has been made

## 2022-12-30 NOTE — Telephone Encounter (Signed)
I can see her at 1 if she can get here

## 2023-01-07 ENCOUNTER — Telehealth: Payer: BC Managed Care – PPO | Admitting: Family Medicine

## 2023-01-24 ENCOUNTER — Ambulatory Visit (INDEPENDENT_AMBULATORY_CARE_PROVIDER_SITE_OTHER): Payer: BC Managed Care – PPO | Admitting: Family Medicine

## 2023-01-24 ENCOUNTER — Encounter: Payer: Self-pay | Admitting: Family Medicine

## 2023-01-24 VITALS — BP 148/84 | HR 85 | Temp 98.5°F | Ht 65.0 in | Wt 153.2 lb

## 2023-01-24 DIAGNOSIS — F339 Major depressive disorder, recurrent, unspecified: Secondary | ICD-10-CM

## 2023-01-24 DIAGNOSIS — Z0000 Encounter for general adult medical examination without abnormal findings: Secondary | ICD-10-CM | POA: Diagnosis not present

## 2023-01-24 DIAGNOSIS — I10 Essential (primary) hypertension: Secondary | ICD-10-CM

## 2023-01-24 DIAGNOSIS — E782 Mixed hyperlipidemia: Secondary | ICD-10-CM

## 2023-01-24 DIAGNOSIS — F419 Anxiety disorder, unspecified: Secondary | ICD-10-CM

## 2023-01-24 LAB — MICROALBUMIN, URINE WAIVED
Creatinine, Urine Waived: 200 mg/dL (ref 10–300)
Microalb, Ur Waived: 80 mg/L — ABNORMAL HIGH (ref 0–19)

## 2023-01-24 MED ORDER — LISINOPRIL 40 MG PO TABS: 40.0000 mg | ORAL_TABLET | Freq: Every day | ORAL | 1 refills | Status: DC

## 2023-01-24 MED ORDER — ALBUTEROL SULFATE HFA 108 (90 BASE) MCG/ACT IN AERS: 2.0000 | INHALATION_SPRAY | Freq: Four times a day (QID) | RESPIRATORY_TRACT | 0 refills | Status: DC | PRN

## 2023-01-24 MED ORDER — DULOXETINE HCL 60 MG PO CPEP: ORAL_CAPSULE | ORAL | 1 refills | Status: DC

## 2023-01-24 MED ORDER — METOPROLOL SUCCINATE ER 100 MG PO TB24: ORAL_TABLET | ORAL | 1 refills | Status: DC

## 2023-01-24 MED ORDER — ROSUVASTATIN CALCIUM 10 MG PO TABS: 10.0000 mg | ORAL_TABLET | Freq: Every day | ORAL | 1 refills | Status: DC

## 2023-01-24 MED ORDER — HYDROCHLOROTHIAZIDE 25 MG PO TABS: 25.0000 mg | ORAL_TABLET | Freq: Every day | ORAL | 1 refills | Status: DC

## 2023-01-24 NOTE — Progress Notes (Unsigned)
BP (!) 149/98 (BP Location: Right Arm, Patient Position: Sitting, Cuff Size: Normal)   Pulse 85   Temp 98.5 F (36.9 C) (Oral)   Ht 5\' 5"  (1.651 m)   Wt 153 lb 3.2 oz (69.5 kg)   SpO2 98%   BMI 25.49 kg/m    Subjective:    Patient ID: Morgan Cisneros, female    DOB: [birth year], 57 y.o.   MRN: [id number]  HPI: Morgan Cisneros is a 57 y.o. female presenting on 01/24/2023 for comprehensive medical examination. Current medical complaints include:  HYPERTENSION / HYPERLIPIDEMIA Satisfied with current treatment? no Duration of hypertension: {Blank single:19197::"chronic","months","years"} BP monitoring frequency: {Blank single:19197::"not checking","rarely","daily","weekly","monthly","a few times a day","a few times a week","a few times a month"} BP medication side effects: {Blank single:19197::"yes","no"} Past BP meds: metoprolol, lisinopril Duration of hyperlipidemia: {Blank single:19197::"chronic","months","years"} Cholesterol medication side effects: {Blank single:19197::"yes","no"} Cholesterol supplements: {Blank multiple:19196::"none","fish oil","niacin","red yeast rice"} Past cholesterol medications: {Blank multiple:19196::"none","atorvastain (lipitor)","lovastatin (mevacor)","pravastatin (pravachol)","rosuvastatin (crestor)","simvastatin (zocor)","vytorin","fenofibrate (tricor)","gemfibrozil","ezetimide (zetia)","niaspan","lovaza"} Medication compliance: {Blank single:19197::"excellent compliance","good compliance","fair compliance","poor compliance"} Aspirin: {Blank single:19197::"yes","no"} Recent stressors: {Blank single:19197::"yes","no"} Recurrent headaches: {Blank single:19197::"yes","no"} Visual changes: {Blank single:19197::"yes","no"} Palpitations: {Blank single:19197::"yes","no"} Dyspnea: {Blank single:19197::"yes","no"} Chest pain: {Blank single:19197::"yes","no"} Lower extremity edema: {Blank single:19197::"yes","no"} Dizzy/lightheaded: {Blank  single:19197::"yes","no"}  ANXIETY/STRESS Duration:{Blank single:19197::"controlled","uncontrolled","better","worse","exacerbated","stable"} Anxious mood: {Blank single:19197::"yes","no"}  Excessive worrying: {Blank single:19197::"yes","no"} Irritability: {Blank single:19197::"yes","no"}  Sweating: {Blank single:19197::"yes","no"} Nausea: {Blank single:19197::"yes","no"} Palpitations:{Blank single:19197::"yes","no"} Hyperventilation: {Blank single:19197::"yes","no"} Panic attacks: {Blank single:19197::"yes","no"} Agoraphobia: {Blank single:19197::"yes","no"}  Obscessions/compulsions: {Blank single:19197::"yes","no"} Depressed mood: {Blank single:19197::"yes","no"}    01/24/2023    2:25 PM 12/30/2022    1:00 PM 05/03/2022    4:18 PM 01/14/2022    3:43 PM 11/27/2021   11:35 AM  Depression screen PHQ 2/9  Decreased Interest 2 2 1 2 1   Down, Depressed, Hopeless 2 2 1 2 2   PHQ - 2 Score 4 4 2 4 3   Altered sleeping 2 2 1 3 2   Tired, decreased energy 2 3 1 2 2   Change in appetite 0 0 1 2 0  Feeling bad or failure about yourself  2 1 1 2 1   Trouble concentrating 2 3 2 2  0  Moving slowly or fidgety/restless 0 0 0 0 0  Suicidal thoughts 0 0 0 0 0  PHQ-9 Score 12 13 8 15 8   Difficult doing work/chores  Somewhat difficult Not difficult at all     Anhedonia: {Blank single:19197::"yes","no"} Weight changes: {Blank single:19197::"yes","no"} Insomnia: {Blank single:19197::"yes","no"} {Blank single:19197::"hard to fall asleep","hard to stay asleep"}  Hypersomnia: {Blank single:19197::"yes","no"} Fatigue/loss of energy: {Blank single:19197::"yes","no"} Feelings of worthlessness: {Blank single:19197::"yes","no"} Feelings of guilt: {Blank single:19197::"yes","no"} Impaired concentration/indecisiveness: {Blank single:19197::"yes","no"} Suicidal ideations: {Blank single:19197::"yes","no"}  Crying spells: {Blank single:19197::"yes","no"} Recent Stressors/Life Changes: {Blank  single:19197::"yes","no"}   Relationship problems: {Blank single:19197::"yes","no"}   Family stress: {Blank single:19197::"yes","no"}     Financial stress: {Blank single:19197::"yes","no"}    Job stress: {Blank single:19197::"yes","no"}    Recent death/loss: {Blank single:19197::"yes","no"}  Menopausal Symptoms: no  Depression Screen done today and results listed below:     01/24/2023    2:25 PM 12/30/2022    1:00 PM 05/03/2022    4:18 PM 01/14/2022    3:43 PM 11/27/2021   11:35 AM  Depression screen PHQ 2/9  Decreased Interest 2 2 1 2 1   Down, Depressed, Hopeless 2 2 1 2 2   PHQ - 2 Score 4 4 2 4 3   Altered sleeping 2 2 1 3 2   Tired, decreased energy 2 3 1 2 2   Change in appetite 0 0 1 2 0  Feeling bad or failure about yourself  2 1 1 2 1   Trouble concentrating 2 3 2 2  0  Moving slowly or fidgety/restless 0 0 0 0 0  Suicidal thoughts 0 0 0 0 0  PHQ-9 Score 12 13 8 15 8   Difficult doing work/chores  Somewhat difficult Not difficult at all      Past Medical History:  Past Medical History:  Diagnosis Date   Abdominal pain    Acute gastritis without hemorrhage    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   Insomnia    LBP (low back pain) 12/13/2014   Ovarian cyst    Renal calculus, right    Renal calculus, right    Thoracic back pain    Right sided    Surgical History:  Past Surgical History:  Procedure Laterality Date   BREAST BIOPSY Left 08/10/2016   [place] core, PASH   BREAST BIOPSY Right 01/17/2020   no clip due to pt condition during biopsy-neg   BREAST BIOPSY Right 02/07/2020   re-biopsy due to prior biopsy/ coil clip/ path pending   COLONOSCOPY  WITH PROPOFOL N/A 04/25/2017   Procedure: COLONOSCOPY WITH PROPOFOL;  Surgeon: Midge Minium, MD;  Location: [facility];  Service: Endoscopy;  Laterality: N/A;   COLONOSCOPY WITH PROPOFOL N/A 05/28/2022   Procedure: COLONOSCOPY WITH PROPOFOL;  Surgeon: Midge Minium, MD;  Location: [facility];  Service: Endoscopy;  Laterality: N/A;   CYSTOSCOPY  11/26/2010   with stent placement   ESOPHAGOGASTRODUODENOSCOPY (EGD) WITH PROPOFOL N/A 04/25/2017   Procedure: ESOPHAGOGASTRODUODENOSCOPY (EGD) WITH PROPOFOL;  Surgeon: Midge Minium, MD;  Location: [facility];  Service: Endoscopy;  Laterality: N/A;   HEMORRHOID SURGERY     kidney stent     LAPAROSCOPIC APPENDECTOMY N/A 02/02/2017   Procedure: APPENDECTOMY LAPAROSCOPIC;  Surgeon: Ricarda Frame, MD;  Location: [facility];  Service: General;  Laterality: N/A;   LAPAROSCOPIC SIGMOID COLECTOMY N/A 11/01/2016   Procedure: LAPAROSCOPIC SIGMOID COLECTOMY;  Surgeon: Ricarda Frame, MD;  Location: [facility];  Service: General;  Laterality: N/A;   LITHOTRIPSY     X 4   POLYPECTOMY  04/25/2017   Procedure: POLYPECTOMY INTESTINAL;  Surgeon: Midge Minium, MD;  Location: [facility];  Service: Endoscopy;;   POLYPECTOMY  05/28/2022   Procedure: POLYPECTOMY;  Surgeon: Midge Minium, MD;  Location: [facility];  Service: Endoscopy;;   TONSILLECTOMY     TOTAL ABDOMINAL HYSTERECTOMY  08/2013   TOTAL ABDOMINAL HYSTERECTOMY W/ BILATERAL SALPINGOOPHORECTOMY     TUBAL LIGATION     Ureteroscopy Right 11/21/1997   with holmium laser    Medications:  Current Outpatient Medications on File Prior to Visit  Medication Sig   albuterol (VENTOLIN HFA) 108 (90 Base) MCG/ACT inhaler Inhale 2 puffs into the lungs every 6 (six) hours as needed for wheezing or shortness of breath.   DULoxetine (CYMBALTA) 60 MG capsule TAKE 1 CAPSULE(60 MG) BY MOUTH DAILY   lisinopril (ZESTRIL) 40 MG tablet Take 1 tablet (40 mg total) by mouth daily.    LORazepam (ATIVAN) 0.5 MG tablet Take 1 tablet (0.5 mg total) by mouth 2 (two) times daily as  needed for anxiety.   metoprolol succinate (TOPROL-XL) 100 MG 24 hr tablet TAKE 1 TABLET(100 MG) BY MOUTH DAILY   rosuvastatin (CRESTOR) 10 MG tablet Take 1 tablet (10 mg total) by mouth daily.   sucralfate (CARAFATE) 1 g tablet Take 1 tablet (1 g total) by mouth 4 (four) times daily -  with meals and at bedtime. (Patient taking differently: Take 1 g by mouth as needed.)   No current facility-administered medications on file prior to visit.    Allergies:  Allergies  Allergen Reactions   Wellbutrin [Bupropion] Other (See Comments)    irritiability   Urocit-K [Potassium Citrate] Nausea Only    Social History:  Social History   Socioeconomic History   Marital status: Widowed    Spouse name: Not on file   Number of children: Not on file   Years of education: Not on file   Highest education level: Not on file  Occupational History   Not on file  Tobacco Use   Smoking status: Every Day    Current packs/day: 0.50    Types: Cigarettes   Smokeless tobacco: Never  Vaping Use   Vaping status: Never Used  Substance and Sexual Activity   Alcohol use: Yes    Alcohol/week: 0.0 standard drinks of alcohol    Comment: occasional   Drug use: No   Sexual activity: Not Currently    Birth control/protection: Surgical  Other Topics Concern   Not on file  Social History Narrative   Not on file   Social Determinants of Health   Financial Resource Strain: Low Risk  (06/19/2021)   Received from [facility], [facility]   Overall Financial Resource Strain (CARDIA)    Difficulty of Paying Living Expenses: Not hard at all  Food Insecurity: No Food Insecurity (06/19/2021)   Received from [facility], [facility]   Hunger Vital Sign    Worried About Running Out of Food in the Last Year: Never true    Ran Out of Food in the Last Year: Never true  Transportation Needs: No Transportation  Needs (06/19/2021)   Received from [facility], [facility]   [facility] - Transportation    Lack of Transportation (Medical): No    Lack of Transportation (Non-Medical): No  Physical Activity: Not on file  Stress: Not on file  Social Connections: Not on file  Intimate Partner Violence: Not on file   Social History   Tobacco Use  Smoking Status Every Day   Current packs/day: 0.50   Types: Cigarettes  Smokeless Tobacco Never   Social History   Substance and Sexual Activity  Alcohol Use Yes   Alcohol/week: 0.0 standard drinks of alcohol   Comment: occasional    Family History:  Family History  Problem Relation Age of Onset   Kidney Stones Mother    Ovarian cancer Mother    Colon cancer Mother    Lung cancer Father    Hyperlipidemia Sister    Hypertension Sister    Hypertension Sister    Hyperlipidemia Sister    Alzheimer's disease Maternal Grandmother    Cancer Maternal Grandfather        Liver   Alzheimer's disease Paternal Grandfather    Breast cancer Neg Hx     Past medical history, surgical history, medications, allergies, family history and social history reviewed with patient today and changes made to appropriate areas of the chart.   Review of Systems  Constitutional:  Positive for diaphoresis.  Negative for chills, fever, malaise/fatigue and weight loss.  HENT:  Positive for sore throat. Negative for congestion, ear discharge, ear pain, hearing loss, nosebleeds, sinus pain and tinnitus.   Eyes: Negative.   Respiratory: Negative.  Negative for stridor.   Cardiovascular:  Positive for palpitations. Negative for chest pain, orthopnea, claudication, leg swelling and PND.  Gastrointestinal: Negative.   Genitourinary: Negative.   Musculoskeletal: Negative.   Skin: Negative.   Neurological: Negative.   Endo/Heme/Allergies:  Positive for environmental allergies. Negative for polydipsia. Does not bruise/bleed easily.  Psychiatric/Behavioral: Negative.      All other ROS negative except what is listed above and in the HPI.      Objective:    BP (!) 149/98 (BP Location: Right Arm, Patient Position: Sitting, Cuff Size: Normal)   Pulse 85   Temp 98.5 F (36.9 C) (Oral)   Ht 5\' 5"  (1.651 m)   Wt 153 lb 3.2 oz (69.5 kg)   SpO2 98%   BMI 25.49 kg/m   Wt Readings from Last 3 Encounters:  01/24/23 153 lb 3.2 oz (69.5 kg)  12/30/22 150 lb 12.8 oz (68.4 kg)  11/26/22 151 lb (68.5 kg)    Physical Exam  Results for orders placed or performed during the hospital encounter of 05/28/22  Surgical pathology  Result Value Ref Range   SURGICAL PATHOLOGY      SURGICAL PATHOLOGY CASE: [id number] PATIENT: [name] Surgical Pathology Report     Specimen Submitted: A. Colon polyp, ascending; cold snare B. Colon polyp x2, transverse; cold snare  Clinical History: Mother colon cancer, personal history of colon polyps. Colon polyps      DIAGNOSIS: A.  COLON POLYP, ASCENDING; COLD SNARE: - TUBULAR ADENOMA. - NEGATIVE FOR HIGH-GRADE DYSPLASIA AND MALIGNANCY.  B.  COLON POLYP X 2, TRANSVERSE; COLD SNARE: - HYPERPLASTIC POLYP (2). - NEGATIVE FOR DYSPLASIA AND MALIGNANCY.  GROSS DESCRIPTION: A. Labeled: Ascending colon polyp cold snare Received: Formalin Collection time: 10:14 AM on 05/28/2022 Placed into formalin time: 10:14 AM on 05/28/2022 Tissue fragment(s): 3 Size: Aggregate, 0.6 x 0.4 x 0.1 cm Description: Tan soft tissue fragments Entirely submitted in 1 cassette.  B. Labeled: Transverse colon polyp x 2 cold snare Received: Formalin Collection time: 10:21 AM on 05/28/2022 Placed into formalin time: 1 0:21 AM on 05/28/2022 Tissue fragment(s): Multiple Size: Aggregate, 1.4 x 0.6 x 0.2 cm Description: Received are fragments of tan-pink soft tissue.  The largest fragment has a resection margin which is inked green.  This fragment is serially sectioned. Entirely submitted in cassettes 1-2 with the serially sectioned  fragment in cassette 1 and the remaining fragments in cassette 2.  RB 05/31/2022   Final Diagnosis performed by Elijah Birk, MD.   Electronically signed 06/01/2022 9:17:17AM The electronic signature indicates that the named Attending Pathologist has evaluated the specimen Technical component performed at [facility], [address], [place], [place] [address] Lab: [id number] Dir: Jolene Schimke, MD, MMM  Professional component performed at [facility], [facility], [address] [place], [place], [place] [address] Lab: [phone] Dir: Beryle Quant, MD       Assessment & Plan:   Problem List Items Addressed This Visit       Cardiovascular and Mediastinum   Essential hypertension   Relevant Orders   Comprehensive metabolic panel   Microalbumin, Urine Waived     Other   Hyperlipidemia   Relevant Orders   Comprehensive metabolic panel   Lipid Panel w/o Chol/HDL Ratio   Other Visit Diagnoses  Routine general medical examination at a health care facility    -  Primary   Relevant Orders   CBC with Differential/Platelet   Comprehensive metabolic panel   Lipid Panel w/o Chol/HDL Ratio   TSH        Follow up plan: No follow-ups on file.   LABORATORY TESTING:  - Pap smear: not applicable  IMMUNIZATIONS:   - Tdap: Tetanus vaccination status reviewed: last tetanus booster within 10 years. - Influenza: Up to date - Pneumovax: {Blank single:19197::"Up to date","Administered today","Not applicable","Refused","Given elsewhere"} - Prevnar: {Blank single:19197::"Up to date","Administered today","Not applicable","Refused","Given elsewhere"} - COVID: {Blank single:19197::"Up to date","Administered today","Not applicable","Refused","Given elsewhere"} - HPV: {Blank single:19197::"Up to date","Administered today","Not applicable","Refused","Given elsewhere"} - Shingrix vaccine: {Blank single:19197::"Up to date","Administered today","Not applicable","Refused","Given  elsewhere"}  SCREENING: -Mammogram: {Blank single:19197::"Up to date","Ordered today","Not applicable","Refused","Done elsewhere"}  - Colonoscopy: {Blank single:19197::"Up to date","Ordered today","Not applicable","Refused","Done elsewhere"}   PATIENT COUNSELING:   Advised to take 1 mg of folate supplement per day if capable of pregnancy.   Sexuality: Discussed sexually transmitted diseases, partner selection, use of condoms, avoidance of unintended pregnancy  and contraceptive alternatives.   Advised to avoid cigarette smoking.  I discussed with the patient that most people either abstain from alcohol or drink within safe limits (<=14/week and <=4 drinks/occasion for males, <=7/weeks and <= 3 drinks/occasion for females) and that the risk for alcohol disorders and other health effects rises proportionally with the number of drinks per week and how often a drinker exceeds daily limits.  Discussed cessation/primary prevention of drug use and availability of treatment for abuse.   Diet: Encouraged to adjust caloric intake to maintain  or achieve ideal body weight, to reduce intake of dietary saturated fat and total fat, to limit sodium intake by avoiding high sodium foods and not adding table salt, and to maintain adequate dietary potassium and calcium preferably from fresh fruits, vegetables, and low-fat dairy products.    stressed the importance of regular exercise  Injury prevention: Discussed safety belts, safety helmets, smoke detector, smoking near bedding or upholstery.   Dental health: Discussed importance of regular tooth brushing, flossing, and dental visits.    NEXT PREVENTATIVE PHYSICAL DUE IN 1 YEAR. No follow-ups on file.

## 2023-01-25 LAB — COMPREHENSIVE METABOLIC PANEL
ALT: 34 [IU]/L — ABNORMAL HIGH (ref 0–32)
AST: 41 [IU]/L — ABNORMAL HIGH (ref 0–40)
Albumin: 4.2 g/dL (ref 3.8–4.9)
Alkaline Phosphatase: 182 [IU]/L — ABNORMAL HIGH (ref 44–121)
BUN/Creatinine Ratio: 27 — ABNORMAL HIGH (ref 9–23)
BUN: 14 mg/dL (ref 6–24)
Bilirubin Total: 0.3 mg/dL (ref 0.0–1.2)
CO2: 21 mmol/L (ref 20–29)
Calcium: 9.6 mg/dL (ref 8.7–10.2)
Chloride: 105 mmol/L (ref 96–106)
Creatinine, Ser: 0.52 mg/dL — ABNORMAL LOW (ref 0.57–1.00)
Globulin, Total: 2.4 g/dL (ref 1.5–4.5)
Glucose: 96 mg/dL (ref 70–99)
Potassium: 4.2 mmol/L (ref 3.5–5.2)
Sodium: 141 mmol/L (ref 134–144)
Total Protein: 6.6 g/dL (ref 6.0–8.5)
eGFR: 110 mL/min/{1.73_m2} (ref >59)

## 2023-01-25 LAB — CBC WITH DIFFERENTIAL/PLATELET
Basophils Absolute: 0 10*3/uL (ref 0.0–0.2)
Basos: 0 %
EOS (ABSOLUTE): 0.1 10*3/uL (ref 0.0–0.4)
Eos: 2 %
Hematocrit: 40.6 % (ref 34.0–46.6)
Hemoglobin: 13.7 g/dL (ref 11.1–15.9)
Immature Grans (Abs): 0 10*3/uL (ref 0.0–0.1)
Immature Granulocytes: 0 %
Lymphocytes Absolute: 2.3 10*3/uL (ref 0.7–3.1)
Lymphs: 38 %
MCH: 34 pg — ABNORMAL HIGH (ref 26.6–33.0)
MCHC: 33.7 g/dL (ref 31.5–35.7)
MCV: 101 fL — ABNORMAL HIGH (ref 79–97)
Monocytes Absolute: 0.5 10*3/uL (ref 0.1–0.9)
Monocytes: 7 %
Neutrophils Absolute: 3.3 10*3/uL (ref 1.4–7.0)
Neutrophils: 53 %
Platelets: 304 10*3/uL (ref 150–450)
RBC: 4.03 x10E6/uL (ref 3.77–5.28)
RDW: 13.7 % (ref 11.7–15.4)
WBC: 6.2 10*3/uL (ref 3.4–10.8)

## 2023-01-25 LAB — LIPID PANEL W/O CHOL/HDL RATIO
Cholesterol, Total: 179 mg/dL (ref 100–199)
HDL: 51 mg/dL (ref >39)
LDL Chol Calc (NIH): 50 mg/dL (ref 0–99)
Triglycerides: 539 mg/dL — ABNORMAL HIGH (ref 0–149)
VLDL Cholesterol Cal: 78 mg/dL — ABNORMAL HIGH (ref 5–40)

## 2023-01-25 LAB — TSH: TSH: 1.2 u[IU]/mL (ref 0.450–4.500)

## 2023-01-25 NOTE — Assessment & Plan Note (Signed)
Under good control on current regimen. Continue current regimen. Continue to monitor. Call with any concerns. Refills given for 3 months. Follow up 3 months.    

## 2023-01-25 NOTE — Assessment & Plan Note (Signed)
Under good control on current regimen. Continue current regimen. Continue to monitor. Call with any concerns. Refills given.   

## 2023-01-25 NOTE — Assessment & Plan Note (Addendum)
Still running a little high. Will start hydrochlorothiazide and recheck 1 month. Call with any concerns.

## 2023-02-03 ENCOUNTER — Encounter: Payer: Self-pay | Admitting: Family Medicine

## 2023-02-03 ENCOUNTER — Telehealth: Payer: BC Managed Care – PPO | Admitting: Family Medicine

## 2023-02-03 DIAGNOSIS — J019 Acute sinusitis, unspecified: Secondary | ICD-10-CM | POA: Diagnosis not present

## 2023-02-03 DIAGNOSIS — B9689 Other specified bacterial agents as the cause of diseases classified elsewhere: Secondary | ICD-10-CM | POA: Diagnosis not present

## 2023-02-03 MED ORDER — AMOXICILLIN-POT CLAVULANATE 875-125 MG PO TABS: 1.0000 | ORAL_TABLET | Freq: Two times a day (BID) | ORAL | 0 refills | Status: DC

## 2023-02-03 MED ORDER — HYDROCOD POLI-CHLORPHE POLI ER 10-8 MG/5ML PO SUER: 5.0000 mL | Freq: Two times a day (BID) | ORAL | 0 refills | Status: DC | PRN

## 2023-02-03 NOTE — Telephone Encounter (Signed)
Seen today. 

## 2023-02-03 NOTE — Progress Notes (Signed)
There were no vitals taken for this visit.   Subjective:    Patient ID: Morgan Cisneros, female    DOB: 1965-08-08, 57 y.o.   MRN: [id number]  HPI: Morgan Cisneros is a 57 y.o. female  Chief Complaint  Patient presents with   Cough    Patient states she is still having a cough. States it has been keeping her up at night and she is coughing all during the day as well. States she has been taking Mucinex for 2 weeks and it has not helped.    UPPER RESPIRATORY TRACT INFECTION Duration: about 3 weeks Worst symptom: cough Fever: no Cough: yes Shortness of breath: no Wheezing: no Chest pain: no Chest tightness: no Chest congestion: yes Nasal congestion: no Runny nose: no Post nasal drip: no Sneezing: no Sore throat: no Swollen glands: no Sinus pressure: no Headache: yes Face pain: no Toothache: no Ear pain: no  Ear pressure: no  Eyes red/itching:no Eye drainage/crusting: no  Vomiting: no Rash: no Fatigue: no Sick contacts: no Strep contacts: no  Context: stable Recurrent sinusitis: no Relief with OTC cold/cough medications: no  Treatments attempted: cold/sinus, mucinex, anti-histamine, pseudoephedrine, and cough syrup   Relevant past medical, surgical, family and social history reviewed and updated as indicated. Interim medical history since our last visit reviewed. Allergies and medications reviewed and updated.  Review of Systems  Constitutional: Negative.   HENT:  Positive for congestion, postnasal drip and rhinorrhea. Negative for dental problem, drooling, ear discharge, ear pain, facial swelling, hearing loss, mouth sores, nosebleeds, sinus pressure, sinus pain, sneezing, sore throat, tinnitus, trouble swallowing and voice change.   Respiratory:  Positive for cough. Negative for apnea, choking, chest tightness, shortness of breath, wheezing and stridor.   Cardiovascular: Negative.   Gastrointestinal: Negative.   Musculoskeletal: Negative.    Psychiatric/Behavioral: Negative.      Per HPI unless specifically indicated above     Objective:    There were no vitals taken for this visit.  Wt Readings from Last 3 Encounters:  01/24/23 153 lb 3.2 oz (69.5 kg)  12/30/22 150 lb 12.8 oz (68.4 kg)  11/26/22 151 lb (68.5 kg)    Physical Exam Vitals and nursing note reviewed.  Constitutional:      General: She is not in acute distress.    Appearance: Normal appearance. She is not ill-appearing, toxic-appearing or diaphoretic.  HENT:     Head: Normocephalic and atraumatic.     Right Ear: External ear normal.     Left Ear: External ear normal.     Nose: Nose normal.     Mouth/Throat:     Mouth: Mucous membranes are moist.     Pharynx: Oropharynx is clear.  Eyes:     General: No scleral icterus.       Right eye: No discharge.        Left eye: No discharge.     Conjunctiva/sclera: Conjunctivae normal.     Pupils: Pupils are equal, round, and reactive to light.  Pulmonary:     Effort: Pulmonary effort is normal. No respiratory distress.     Comments: Speaking in full sentences Musculoskeletal:        General: Normal range of motion.     Cervical back: Normal range of motion.  Skin:    Coloration: Skin is not jaundiced or pale.     Findings: No bruising, erythema, lesion or rash.  Neurological:     Mental Status: She is alert and oriented  to person, place, and time. Mental status is at baseline.  Psychiatric:        Mood and Affect: Mood normal.        Behavior: Behavior normal.        Thought Content: Thought content normal.        Judgment: Judgment normal.     Results for orders placed or performed in visit on 01/24/23  CBC with Differential/Platelet  Result Value Ref Range   WBC 6.2 3.4 - 10.8 x10E3/uL   RBC 4.03 3.77 - 5.28 x10E6/uL   Hemoglobin 13.7 11.1 - 15.9 g/dL   Hematocrit 16.1 09.6 - 46.6 %   MCV 101 (H) 79 - 97 fL   MCH 34.0 (H) 26.6 - 33.0 pg   MCHC 33.7 31.5 - 35.7 g/dL   RDW 04.5 40.9 - 81.1  %   Platelets 304 150 - 450 x10E3/uL   Neutrophils 53 Not Estab. %   Lymphs 38 Not Estab. %   Monocytes 7 Not Estab. %   Eos 2 Not Estab. %   Basos 0 Not Estab. %   Neutrophils Absolute 3.3 1.4 - 7.0 x10E3/uL   Lymphocytes Absolute 2.3 0.7 - 3.1 x10E3/uL   Monocytes Absolute 0.5 0.1 - 0.9 x10E3/uL   EOS (ABSOLUTE) 0.1 0.0 - 0.4 x10E3/uL   Basophils Absolute 0.0 0.0 - 0.2 x10E3/uL   Immature Granulocytes 0 Not Estab. %   Immature Grans (Abs) 0.0 0.0 - 0.1 x10E3/uL  Comprehensive metabolic panel  Result Value Ref Range   Glucose 96 70 - 99 mg/dL   BUN 14 6 - 24 mg/dL   Creatinine, Ser 9.14 (L) 0.57 - 1.00 mg/dL   eGFR 782 >95 AO/ZHY/8.65   BUN/Creatinine Ratio 27 (H) 9 - 23   Sodium 141 134 - 144 mmol/L   Potassium 4.2 3.5 - 5.2 mmol/L   Chloride 105 96 - 106 mmol/L   CO2 21 20 - 29 mmol/L   Calcium 9.6 8.7 - 10.2 mg/dL   Total Protein 6.6 6.0 - 8.5 g/dL   Albumin 4.2 3.8 - 4.9 g/dL   Globulin, Total 2.4 1.5 - 4.5 g/dL   Bilirubin Total 0.3 0.0 - 1.2 mg/dL   Alkaline Phosphatase 182 (H) 44 - 121 IU/L   AST 41 (H) 0 - 40 IU/L   ALT 34 (H) 0 - 32 IU/L  Lipid Panel w/o Chol/HDL Ratio  Result Value Ref Range   Cholesterol, Total 179 100 - 199 mg/dL   Triglycerides 784 (H) 0 - 149 mg/dL   HDL 51 >69 mg/dL   VLDL Cholesterol Cal 78 (H) 5 - 40 mg/dL   LDL Chol Calc (NIH) 50 0 - 99 mg/dL  TSH  Result Value Ref Range   TSH 1.200 0.450 - 4.500 uIU/mL  Microalbumin, Urine Waived  Result Value Ref Range   Microalb, Ur Waived 80 (H) 0 - 19 mg/L   Creatinine, Urine Waived 200 10 - 300 mg/dL   Microalb/Creat Ratio 30-300 (H) <30 mg/g      Assessment & Plan:   Problem List Items Addressed This Visit   None Visit Diagnoses     Acute bacterial sinusitis    -  Primary   Will treat with augmentin. Call with any concerns. Continue to monitor.   Relevant Medications   amoxicillin-clavulanate (AUGMENTIN) 875-125 MG tablet   chlorpheniramine-HYDROcodone (TUSSIONEX) 10-8 MG/5ML         Follow up plan: Return if symptoms worsen or fail to improve.  This visit was completed via video visit through MyChart due to the restrictions of the COVID-19 pandemic. All issues as above were discussed and addressed. Physical exam was done as above through visual confirmation on video through MyChart. If it was felt that the patient should be evaluated in the office, they were directed there. The patient verbally consented to this visit. Location of the patient: home Location of the provider: work Those involved with this call:  Provider: Olevia Perches, DO CMA: Malen Gauze, CMA Front Desk/Registration:  Servando Snare   Time spent on call:  15 minutes with patient face to face via video conference. More than 50% of this time was spent in counseling and coordination of care. 23 minutes total spent in review of patient's record and preparation of their chart.

## 2023-02-21 ENCOUNTER — Encounter: Payer: Self-pay | Admitting: Family Medicine

## 2023-02-21 ENCOUNTER — Ambulatory Visit (INDEPENDENT_AMBULATORY_CARE_PROVIDER_SITE_OTHER): Payer: BC Managed Care – PPO | Admitting: Family Medicine

## 2023-02-21 VITALS — BP 110/69 | HR 83 | Temp 97.6°F | Resp 14 | Ht 65.0 in | Wt 146.2 lb

## 2023-02-21 DIAGNOSIS — I10 Essential (primary) hypertension: Secondary | ICD-10-CM | POA: Diagnosis not present

## 2023-02-21 DIAGNOSIS — F419 Anxiety disorder, unspecified: Secondary | ICD-10-CM | POA: Diagnosis not present

## 2023-02-21 DIAGNOSIS — Z720 Tobacco use: Secondary | ICD-10-CM | POA: Insufficient documentation

## 2023-02-21 MED ORDER — LORAZEPAM 0.5 MG PO TABS: 0.5000 mg | ORAL_TABLET | Freq: Two times a day (BID) | ORAL | 2 refills | Status: DC | PRN

## 2023-02-21 MED ORDER — VARENICLINE TARTRATE 1 MG PO TABS: 1.0000 mg | ORAL_TABLET | Freq: Two times a day (BID) | ORAL | 1 refills | Status: DC

## 2023-02-21 MED ORDER — VARENICLINE TARTRATE (STARTER) 0.5 MG X 11 & 1 MG X 42 PO TBPK: ORAL_TABLET | ORAL | 0 refills | Status: DC

## 2023-02-21 NOTE — Progress Notes (Signed)
BP 110/69 (BP Location: Left Arm, Patient Position: Sitting, Cuff Size: Normal)   Pulse 83   Temp 97.6 F (36.4 C) (Oral)   Resp 14   Ht 5\' 5"  (1.651 m)   Wt 146 lb 3.2 oz (66.3 kg)   SpO2 98%   BMI 24.33 kg/m    Subjective:    Patient ID: Morgan Cisneros, female    DOB: 23-Apr-1965, 57 y.o.   MRN: [id number]  HPI: Morgan Cisneros is a 57 y.o. female  Chief Complaint  Patient presents with   Hypertension    Stated hydrochlorothiazide. Feels better. Has not felt the need to check her BP since starting the hydrochlorothiazide.    HYPERTENSION  Hypertension status: controlled  Satisfied with current treatment? yes Duration of hypertension: chronic BP monitoring frequency:  not checking BP medication side effects:  no Medication compliance: excellent compliance Previous BP meds:hydrochlorothiazide, metoprolol, lisinopril,  Aspirin: no Recurrent headaches: no Visual changes: no Palpitations: no Dyspnea: no Chest pain: no Lower extremity edema: no Dizzy/lightheaded: no  Relevant past medical, surgical, family and social history reviewed and updated as indicated. Interim medical history since our last visit reviewed. Allergies and medications reviewed and updated.  Review of Systems  Constitutional: Negative.   Respiratory: Negative.    Cardiovascular: Negative.   Musculoskeletal: Negative.   Psychiatric/Behavioral: Negative.      Per HPI unless specifically indicated above     Objective:    BP 110/69 (BP Location: Left Arm, Patient Position: Sitting, Cuff Size: Normal)   Pulse 83   Temp 97.6 F (36.4 C) (Oral)   Resp 14   Ht 5\' 5"  (1.651 m)   Wt 146 lb 3.2 oz (66.3 kg)   SpO2 98%   BMI 24.33 kg/m   Wt Readings from Last 3 Encounters:  02/21/23 146 lb 3.2 oz (66.3 kg)  01/24/23 153 lb 3.2 oz (69.5 kg)  12/30/22 150 lb 12.8 oz (68.4 kg)    Physical Exam Vitals and nursing note reviewed.  Constitutional:      General: She is not in acute  distress.    Appearance: Normal appearance. She is not ill-appearing, toxic-appearing or diaphoretic.  HENT:     Head: Normocephalic and atraumatic.     Right Ear: External ear normal.     Left Ear: External ear normal.     Nose: Nose normal.     Mouth/Throat:     Mouth: Mucous membranes are moist.     Pharynx: Oropharynx is clear.  Eyes:     General: No scleral icterus.       Right eye: No discharge.        Left eye: No discharge.     Extraocular Movements: Extraocular movements intact.     Conjunctiva/sclera: Conjunctivae normal.     Pupils: Pupils are equal, round, and reactive to light.  Cardiovascular:     Rate and Rhythm: Normal rate and regular rhythm.     Pulses: Normal pulses.     Heart sounds: Normal heart sounds. No murmur heard.    No friction rub. No gallop.  Pulmonary:     Effort: Pulmonary effort is normal. No respiratory distress.     Breath sounds: Normal breath sounds. No stridor. No wheezing, rhonchi or rales.  Chest:     Chest wall: No tenderness.  Musculoskeletal:        General: Normal range of motion.     Cervical back: Normal range of motion and neck supple.  Skin:  General: Skin is warm and dry.     Capillary Refill: Capillary refill takes less than 2 seconds.     Coloration: Skin is not jaundiced or pale.     Findings: No bruising, erythema, lesion or rash.  Neurological:     General: No focal deficit present.     Mental Status: She is alert and oriented to person, place, and time. Mental status is at baseline.  Psychiatric:        Mood and Affect: Mood normal.        Behavior: Behavior normal.        Thought Content: Thought content normal.        Judgment: Judgment normal.     Results for orders placed or performed in visit on 01/24/23  CBC with Differential/Platelet  Result Value Ref Range   WBC 6.2 3.4 - 10.8 x10E3/uL   RBC 4.03 3.77 - 5.28 x10E6/uL   Hemoglobin 13.7 11.1 - 15.9 g/dL   Hematocrit 52.8 41.3 - 46.6 %   MCV 101 (H) 79  - 97 fL   MCH 34.0 (H) 26.6 - 33.0 pg   MCHC 33.7 31.5 - 35.7 g/dL   RDW 24.4 01.0 - 27.2 %   Platelets 304 150 - 450 x10E3/uL   Neutrophils 53 Not Estab. %   Lymphs 38 Not Estab. %   Monocytes 7 Not Estab. %   Eos 2 Not Estab. %   Basos 0 Not Estab. %   Neutrophils Absolute 3.3 1.4 - 7.0 x10E3/uL   Lymphocytes Absolute 2.3 0.7 - 3.1 x10E3/uL   Monocytes Absolute 0.5 0.1 - 0.9 x10E3/uL   EOS (ABSOLUTE) 0.1 0.0 - 0.4 x10E3/uL   Basophils Absolute 0.0 0.0 - 0.2 x10E3/uL   Immature Granulocytes 0 Not Estab. %   Immature Grans (Abs) 0.0 0.0 - 0.1 x10E3/uL  Comprehensive metabolic panel  Result Value Ref Range   Glucose 96 70 - 99 mg/dL   BUN 14 6 - 24 mg/dL   Creatinine, Ser 5.36 (L) 0.57 - 1.00 mg/dL   eGFR 644 >03 KV/QQV/9.56   BUN/Creatinine Ratio 27 (H) 9 - 23   Sodium 141 134 - 144 mmol/L   Potassium 4.2 3.5 - 5.2 mmol/L   Chloride 105 96 - 106 mmol/L   CO2 21 20 - 29 mmol/L   Calcium 9.6 8.7 - 10.2 mg/dL   Total Protein 6.6 6.0 - 8.5 g/dL   Albumin 4.2 3.8 - 4.9 g/dL   Globulin, Total 2.4 1.5 - 4.5 g/dL   Bilirubin Total 0.3 0.0 - 1.2 mg/dL   Alkaline Phosphatase 182 (H) 44 - 121 IU/L   AST 41 (H) 0 - 40 IU/L   ALT 34 (H) 0 - 32 IU/L  Lipid Panel w/o Chol/HDL Ratio  Result Value Ref Range   Cholesterol, Total 179 100 - 199 mg/dL   Triglycerides 387 (H) 0 - 149 mg/dL   HDL 51 >56 mg/dL   VLDL Cholesterol Cal 78 (H) 5 - 40 mg/dL   LDL Chol Calc (NIH) 50 0 - 99 mg/dL  TSH  Result Value Ref Range   TSH 1.200 0.450 - 4.500 uIU/mL  Microalbumin, Urine Waived  Result Value Ref Range   Microalb, Ur Waived 80 (H) 0 - 19 mg/L   Creatinine, Urine Waived 200 10 - 300 mg/dL   Microalb/Creat Ratio 30-300 (H) <30 mg/g      Assessment & Plan:   Problem List Items Addressed This Visit  Cardiovascular and Mediastinum   Essential hypertension - Primary    Under good control on current regimen. Continue current regimen. Continue to monitor. Call with any concerns.  Refills up to date. Labs drawn today.        Relevant Orders   Basic metabolic panel     Other   Anxiety    Sister was just admitted to hospice. Refill of lorazepam given. Recheck 3 months.       Tobacco abuse    Will start chantix and recheck in about 3 months. Call with any concerns.         Follow up plan: Return in about 3 months (around 05/24/2023).

## 2023-02-21 NOTE — Assessment & Plan Note (Signed)
Sister was just admitted to hospice. Refill of lorazepam given. Recheck 3 months.

## 2023-02-21 NOTE — Assessment & Plan Note (Signed)
Under good control on current regimen. Continue current regimen. Continue to monitor. Call with any concerns. Refills up to date. Labs drawn today.  

## 2023-02-21 NOTE — Assessment & Plan Note (Signed)
Will start chantix and recheck in about 3 months. Call with any concerns.

## 2023-02-22 LAB — BASIC METABOLIC PANEL
BUN/Creatinine Ratio: 35 — ABNORMAL HIGH (ref 9–23)
BUN: 37 mg/dL — ABNORMAL HIGH (ref 6–24)
CO2: 22 mmol/L (ref 20–29)
Calcium: 10.1 mg/dL (ref 8.7–10.2)
Chloride: 99 mmol/L (ref 96–106)
Creatinine, Ser: 1.05 mg/dL — ABNORMAL HIGH (ref 0.57–1.00)
Glucose: 129 mg/dL — ABNORMAL HIGH (ref 70–99)
Potassium: 4.5 mmol/L (ref 3.5–5.2)
Sodium: 138 mmol/L (ref 134–144)
eGFR: 62 mL/min/{1.73_m2} (ref >59)

## 2023-04-08 ENCOUNTER — Ambulatory Visit
Admission: RE | Admit: 2023-04-08 | Discharge: 2023-04-08 | Disposition: A | Discharge status: Home / Self Care | Payer: 59 | Source: Ambulatory Visit | Attending: Physician Assistant

## 2023-04-08 VITALS — BP 97/68 | HR 78 | Temp 97.8°F | Resp 18

## 2023-04-08 DIAGNOSIS — R319 Hematuria, unspecified: Secondary | ICD-10-CM

## 2023-04-08 DIAGNOSIS — R809 Proteinuria, unspecified: Secondary | ICD-10-CM

## 2023-04-08 DIAGNOSIS — R233 Spontaneous ecchymoses: Secondary | ICD-10-CM

## 2023-04-08 DIAGNOSIS — R7989 Other specified abnormal findings of blood chemistry: Secondary | ICD-10-CM | POA: Diagnosis not present

## 2023-04-08 LAB — CBC WITH DIFFERENTIAL/PLATELET
Abs Immature Granulocytes: 0.03 10*3/uL (ref 0.00–0.07)
Basophils Absolute: 0 10*3/uL (ref 0.0–0.1)
Basophils Relative: 0 %
Eosinophils Absolute: 0.2 10*3/uL (ref 0.0–0.5)
Eosinophils Relative: 2 %
HCT: 37.9 % (ref 36.0–46.0)
Hemoglobin: 13 g/dL (ref 12.0–15.0)
Immature Granulocytes: 0 %
Lymphocytes Relative: 34 %
Lymphs Abs: 2.7 10*3/uL (ref 0.7–4.0)
MCH: 34.3 pg — ABNORMAL HIGH (ref 26.0–34.0)
MCHC: 34.3 g/dL (ref 30.0–36.0)
MCV: 100 fL (ref 80.0–100.0)
Monocytes Absolute: 0.5 10*3/uL (ref 0.1–1.0)
Monocytes Relative: 7 %
Neutro Abs: 4.5 10*3/uL (ref 1.7–7.7)
Neutrophils Relative %: 57 %
Platelets: 322 10*3/uL (ref 150–400)
RBC: 3.79 MIL/uL — ABNORMAL LOW (ref 3.87–5.11)
RDW: 13.9 % (ref 11.5–15.5)
WBC: 8 10*3/uL (ref 4.0–10.5)
nRBC: 0 % (ref 0.0–0.2)

## 2023-04-08 LAB — URINALYSIS, W/ REFLEX TO CULTURE (INFECTION SUSPECTED)
Bilirubin Urine: NEGATIVE
Glucose, UA: NEGATIVE mg/dL
Ketones, ur: NEGATIVE mg/dL
Leukocytes,Ua: NEGATIVE
Nitrite: NEGATIVE
Specific Gravity, Urine: 1.025 (ref 1.005–1.030)
pH: 5.5 (ref 5.0–8.0)

## 2023-04-08 LAB — COMPREHENSIVE METABOLIC PANEL
ALT: 37 U/L (ref 0–44)
AST: 31 U/L (ref 15–41)
Albumin: 4.5 g/dL (ref 3.5–5.0)
Alkaline Phosphatase: 114 U/L (ref 38–126)
Anion gap: 11 (ref 5–15)
BUN: 36 mg/dL — ABNORMAL HIGH (ref 6–20)
CO2: 21 mmol/L — ABNORMAL LOW (ref 22–32)
Calcium: 9.8 mg/dL (ref 8.9–10.3)
Chloride: 105 mmol/L (ref 98–111)
Creatinine, Ser: 1.19 mg/dL — ABNORMAL HIGH (ref 0.44–1.00)
GFR, Estimated: 54 mL/min — ABNORMAL LOW (ref >60)
Glucose, Bld: 92 mg/dL (ref 70–99)
Potassium: 4.8 mmol/L (ref 3.5–5.1)
Sodium: 137 mmol/L (ref 135–145)
Total Bilirubin: 0.9 mg/dL (ref 0.0–1.2)
Total Protein: 7.8 g/dL (ref 6.5–8.1)

## 2023-04-08 NOTE — Discharge Instructions (Signed)
-  You have been advised to go to the emergency department for further evaluation.  I have concerns about a condition called henoch schonlein purpura or hypersensitivity vasculitis causing your symptoms. - Your kidney function has become quite a bit reduced from what it was just a couple of months ago.  Your creatinine in October was 0.52 and is now 1.19.  Your GFR in October was 110 and it is now 16.  You have blood and protein in your urine and all this indicates that your kidneys are affected. - As advised you should have further evaluation of your kidney function in regards to this rash.  You may have an autoimmune condition in your kidneys should be monitored. - Please proceed to the emergency department tonight or tomorrow.  Increase your rest and fluids.

## 2023-04-08 NOTE — ED Provider Notes (Signed)
 [facility]    CSN: [id number] Arrival date & time: 04/08/23  1556      History   Chief Complaint Chief Complaint  Patient presents with   Rash    Entered by patient   Cough    HPI Morgan Cisneros is a 58 y.o. female presenting for multiple complaints.   Patient reports petechial rash of both lower extremities x 2 weeks. Developed rash on hands a couple of days ago. Denies pain, pruritus or fever.   Patient reports cough and congestion that began this morning.  No chest pain, wheezing or shortness of breath.  Denies any sick contacts.  States her symptoms seem more like a sinus infection and she declines COVID testing.  States she started hydrochlorothiazide  in November 2024.  Last the only new medication.  Patient has history of hypertension, kidney stones, diverticulitis, and IBS.  Denies any history of coagulopathies or bleeding disorders.  No tickborne illnesses.  HPI  Past Medical History:  Diagnosis Date   Abdominal pain    Acute gastritis without hemorrhage    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   Insomnia    LBP (low back pain) 12/13/2014   Ovarian cyst    Renal calculus, right    Renal calculus, right    Thoracic back pain    Right sided    Patient Active Problem List   Diagnosis Date Noted   Tobacco abuse 02/21/2023   Hyperlipidemia 05/03/2022   Grade II hemorrhoids 11/27/2021   Elevated liver enzymes 06/19/2021   Palpitations 02/12/2021   Dupuytren's contracture 01/04/2019   Essential hypertension 06/29/2018   Depression, recurrent (HCC) 06/29/2018   Benign neoplasm of ascending colon    Anxiety 04/18/2017    Spasm of back muscles 10/28/2016   Diverticulitis 10/19/2016   GERD (gastroesophageal reflux disease) 04/18/2015   Menopausal symptoms 04/18/2015   Insomnia    Family history of colon cancer in mother 01/15/2015   Family history of ovarian cancer 01/15/2015   Nicotine dependence, cigarettes, uncomplicated 01/15/2015   Lymphadenopathy, axillary 01/15/2015   Calculus of kidney 12/13/2014   Female genuine stress incontinence 12/13/2014    Past Surgical History:  Procedure Laterality Date   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, PASH   BREAST BIOPSY Right 01/17/2020   no clip due to pt condition during biopsy-neg   BREAST BIOPSY Right 02/07/2020   re-biopsy due to prior biopsy/ coil clip/ path pending   COLONOSCOPY WITH PROPOFOL  N/A 04/25/2017   Procedure: COLONOSCOPY WITH PROPOFOL ;  Surgeon: Jinny Carmine, MD;  Location: [facility];  Service: Endoscopy;  Laterality: N/A;   COLONOSCOPY WITH PROPOFOL  N/A 05/28/2022   Procedure: COLONOSCOPY WITH PROPOFOL ;  Surgeon: Jinny Carmine, MD;  Location: [facility];  Service: Endoscopy;  Laterality: N/A;   CYSTOSCOPY  11/26/2010   with stent placement   ESOPHAGOGASTRODUODENOSCOPY (EGD) WITH PROPOFOL  N/A 04/25/2017   Procedure: ESOPHAGOGASTRODUODENOSCOPY (EGD) WITH PROPOFOL ;  Surgeon: Jinny Carmine, MD;  Location: [facility];  Service: Endoscopy;  Laterality: N/A;   HEMORRHOID SURGERY     kidney stent     LAPAROSCOPIC APPENDECTOMY N/A 02/02/2017  Procedure: APPENDECTOMY LAPAROSCOPIC;  Surgeon: Shelva Dunnings, MD;  Location: [facility];  Service: General;  Laterality: N/A;   LAPAROSCOPIC SIGMOID COLECTOMY N/A 11/01/2016   Procedure: LAPAROSCOPIC SIGMOID COLECTOMY;  Surgeon: Shelva Dunnings, MD;  Location: [facility];  Service: General;  Laterality: N/A;   LITHOTRIPSY     X 4   POLYPECTOMY  04/25/2017   Procedure: POLYPECTOMY INTESTINAL;  Surgeon: Jinny Carmine, MD;  Location: [facility];  Service: Endoscopy;;    POLYPECTOMY  05/28/2022   Procedure: POLYPECTOMY;  Surgeon: Jinny Carmine, MD;  Location: [facility];  Service: Endoscopy;;   TONSILLECTOMY     TOTAL ABDOMINAL HYSTERECTOMY  08/2013   TOTAL ABDOMINAL HYSTERECTOMY W/ BILATERAL SALPINGOOPHORECTOMY     TUBAL LIGATION     Ureteroscopy Right 11/21/1997   with holmium laser    OB History   No obstetric history on file.      Home Medications    Prior to Admission medications   Medication Sig Start Date End Date Taking? Authorizing Provider  albuterol  (VENTOLIN  HFA) 108 (90 Base) MCG/ACT inhaler Inhale 2 puffs into the lungs every 6 (six) hours as needed for wheezing or shortness of breath. 01/24/23   Johnson, Megan P, DO  DULoxetine  (CYMBALTA ) 60 MG capsule TAKE 1 CAPSULE(60 MG) BY MOUTH DAILY 01/24/23   Johnson, Megan P, DO  hydrochlorothiazide  (HYDRODIURIL ) 25 MG tablet Take 1 tablet (25 mg total) by mouth daily. 01/24/23   Johnson, Megan P, DO  lisinopril  (ZESTRIL ) 40 MG tablet Take 1 tablet (40 mg total) by mouth daily. 01/24/23   Johnson, Megan P, DO  LORazepam  (ATIVAN ) 0.5 MG tablet Take 1 tablet (0.5 mg total) by mouth 2 (two) times daily as needed for anxiety. 02/21/23   Johnson, Megan P, DO  metoprolol  succinate (TOPROL -XL) 100 MG 24 hr tablet TAKE 1 TABLET(100 MG) BY MOUTH DAILY 01/24/23   Johnson, Megan P, DO  rosuvastatin  (CRESTOR ) 10 MG tablet Take 1 tablet (10 mg total) by mouth daily. 01/24/23   Johnson, Megan P, DO  sucralfate  (CARAFATE ) 1 g tablet Take 1 tablet (1 g total) by mouth 4 (four) times daily -  with meals and at bedtime. Patient taking differently: Take 1 g by mouth as needed. 10/01/21   Johnson, Megan P, DO  varenicline  (CHANTIX  CONTINUING MONTH PAK) 1 MG tablet Take 1 tablet (1 mg total) by mouth 2 (two) times daily. 02/21/23   Johnson, Megan P, DO  Varenicline  Tartrate, Starter, (CHANTIX  STARTING MONTH PAK) 0.5 MG X 11 & 1 MG X 42 TBPK As directed 02/21/23   Vicci Duwaine SQUIBB, DO    Family History Family  History  Problem Relation Age of Onset   Kidney Stones Mother    Ovarian cancer Mother    Colon cancer Mother    Lung cancer Father    Hyperlipidemia Sister    Hypertension Sister    Hypertension Sister    Hyperlipidemia Sister    Alzheimer's disease Maternal Grandmother    Cancer Maternal Grandfather        Liver   Alzheimer's disease Paternal Grandfather    Breast cancer Neg Hx     Social History Social History   Tobacco Use   Smoking status: Every Day    Current packs/day: 0.50    Types: Cigarettes   Smokeless tobacco: Never  Vaping Use   Vaping status: Never Used  Substance Use Topics   Alcohol use: Yes    Alcohol/week: 0.0 standard drinks of alcohol  Comment: occasional   Drug use: No     Allergies   Wellbutrin  [bupropion ] and Urocit-k [potassium citrate]   Review of Systems Review of Systems  Constitutional:  Negative for chills, diaphoresis, fatigue and fever.  HENT:  Positive for congestion. Negative for ear pain, rhinorrhea, sinus pressure, sinus pain and sore throat.   Respiratory:  Positive for cough. Negative for shortness of breath and wheezing.   Cardiovascular:  Negative for chest pain and palpitations.  Gastrointestinal:  Negative for abdominal pain, nausea and vomiting.  Musculoskeletal:  Negative for arthralgias and myalgias.  Skin:  Positive for rash.  Neurological:  Negative for weakness and headaches.  Hematological:  Negative for adenopathy.     Physical Exam Triage Vital Signs ED Triage Vitals [04/08/23 1630]  Encounter Vitals Group     BP 97/68     Systolic BP Percentile      Diastolic BP Percentile      Pulse Rate 78     Resp 18     Temp 97.8 F (36.6 C)     Temp Source Oral     SpO2 96 %     Weight      Height      Head Circumference      Peak Flow      Pain Score 0     Pain Loc      Pain Education      Exclude from Growth Chart    No data found.  Updated Vital Signs BP 97/68 (BP Location: Left Arm)   Pulse 78    Temp 97.8 F (36.6 C) (Oral)   Resp 18   SpO2 96%      Physical Exam Vitals and nursing note reviewed.  Constitutional:      General: She is not in acute distress.    Appearance: Normal appearance. She is not ill-appearing or toxic-appearing.  HENT:     Head: Normocephalic and atraumatic.     Nose: Congestion present.     Mouth/Throat:     Mouth: Mucous membranes are moist.     Pharynx: Oropharynx is clear.  Eyes:     General: No scleral icterus.       Right eye: No discharge.        Left eye: No discharge.     Conjunctiva/sclera: Conjunctivae normal.  Cardiovascular:     Rate and Rhythm: Normal rate and regular rhythm.     Heart sounds: Normal heart sounds.  Pulmonary:     Effort: Pulmonary effort is normal. No respiratory distress.     Breath sounds: Normal breath sounds.  Musculoskeletal:     Cervical back: Neck supple.  Skin:    General: Skin is dry.     Findings: Rash (petechial rash bilateral lower extremities distal to knee and bilateral dorsal hands, wrists and forearms) present.  Neurological:     General: No focal deficit present.     Mental Status: She is alert. Mental status is at baseline.     Motor: No weakness.     Gait: Gait normal.  Psychiatric:        Mood and Affect: Mood normal.        Behavior: Behavior normal.      UC Treatments / Results  Labs (all labs ordered are listed, but only abnormal results are displayed) Labs Reviewed  CBC WITH DIFFERENTIAL/PLATELET - Abnormal; Notable for the following components:      Result Value   RBC 3.79 (*)  MCH 34.3 (*)    All other components within normal limits  COMPREHENSIVE METABOLIC PANEL - Abnormal; Notable for the following components:   CO2 21 (*)    BUN 36 (*)    Creatinine, Ser 1.19 (*)    GFR, Estimated 54 (*)    All other components within normal limits  URINALYSIS, W/ REFLEX TO CULTURE (INFECTION SUSPECTED) - Abnormal; Notable for the following components:   Hgb urine dipstick  SMALL (*)    Protein, ur TRACE (*)    Bacteria, UA FEW (*)    All other components within normal limits  ROCKY MTN SPOTTED FVR ABS PNL(IGG+IGM)    EKG   Radiology No results found.  Procedures Procedures (including critical care time)  Medications Ordered in UC Medications - No data to display  Initial Impression / Assessment and Plan / UC Course  I have reviewed the triage vital signs and the nursing notes.  Pertinent labs & imaging results that were available during my care of the patient were reviewed by me and considered in my medical decision making (see chart for details).   58 year old female presents for 2 separate complaints.  Reports petechial rash on lower extremities from knee down x 2 weeks, on dorsal hands and wrists for the past couple days.  Also reports cough and congestion that began today.  No fever, chest pain, wheezing or shortness of breath, pruritus, swelling.  No similar rashes in the past or history of skin problems, coagulopathies, bleeding disorders.  Vitals are normal and stable and she is overall well-appearing.  On exam is nasal congestion.  Throat clear.  Chest clear to station.  Petechial rash of dorsal hands, wrists and from knees to ankles bilaterally.  Will obtain CBC, CMP and are RMSF testing for evaluation of rash.  Patient declines COVID or flu testing.  Creatinine today is 1.19 and it was 0.52 in October.  GFR today is 54 and it was 110 in October. CBC with normal platelet count.  Slightly reduced RBC count but normal hemoglobin level. Urinalysis obtained shows blood and protein.  Reviewed all results with patient.  I have concerns about Henoch-Schnlein purpura or vasculitis causing her symptoms.  I am most concerned because her kidney function has reduced significantly over the past couple of months.  I discussed this with her and encouraged her to follow-up in the emergency department for further evaluation of her reduced kidney function.   Patient is agreeable and plans to go to [facility] in the morning since it is the weekend and she does not want to wait longer to follow-up with PCP.  1 undiagnosed new problem with uncertain prognosis warranting follow-up in the ED for concerns about organ/kidney damage.  Final Clinical Impressions(s) / UC Diagnoses   Final diagnoses:  Petechial rash  Hematuria, unspecified type  Proteinuria, unspecified type  Elevated serum creatinine     Discharge Instructions      -You have been advised to go to the emergency department for further evaluation.  I have concerns about a condition called henoch schonlein purpura or hypersensitivity vasculitis causing your symptoms. - Your kidney function has become quite a bit reduced from what it was just a couple of months ago.  Your creatinine in October was 0.52 and is now 1.19.  Your GFR in October was 110 and it is now 79.  You have blood and protein in your urine and all this indicates that your kidneys are affected. - As advised you  should have further evaluation of your kidney function in regards to this rash.  You may have an autoimmune condition in your kidneys should be monitored. - Please proceed to the emergency department tonight or tomorrow.  Increase your rest and fluids.     ED Prescriptions   None    PDMP not reviewed this encounter.   Arvis Jolan NOVAK, PA-C 04/09/23 1047

## 2023-04-08 NOTE — ED Triage Notes (Signed)
 Pt c/o rash to bilat feet and shins, hands and forearms x2 weeks. Lower extremities greater than upper. Denies itching/pain. Has not tried any interventions.  Also reports concern for bronchitis. Reports nasal symptoms yesterday followed by productive cough (green sputum) today.

## 2023-04-11 ENCOUNTER — Telehealth (INDEPENDENT_AMBULATORY_CARE_PROVIDER_SITE_OTHER): Payer: 59 | Admitting: Family Medicine

## 2023-04-11 ENCOUNTER — Encounter: Payer: Self-pay | Admitting: Family Medicine

## 2023-04-11 ENCOUNTER — Telehealth: Payer: Self-pay | Admitting: Family Medicine

## 2023-04-11 DIAGNOSIS — R21 Rash and other nonspecific skin eruption: Secondary | ICD-10-CM

## 2023-04-11 DIAGNOSIS — N289 Disorder of kidney and ureter, unspecified: Secondary | ICD-10-CM | POA: Diagnosis not present

## 2023-04-11 DIAGNOSIS — I10 Essential (primary) hypertension: Secondary | ICD-10-CM | POA: Diagnosis not present

## 2023-04-11 DIAGNOSIS — F17210 Nicotine dependence, cigarettes, uncomplicated: Secondary | ICD-10-CM

## 2023-04-11 NOTE — Telephone Encounter (Signed)
 Scheduled pt a VV this am to go over her concerns

## 2023-04-11 NOTE — Assessment & Plan Note (Signed)
 Concern for side effects from hydrochlorothiazide. Will stop. Recheck 2-3 weeks.

## 2023-04-11 NOTE — Progress Notes (Signed)
 There were no vitals taken for this visit.   Subjective:    Patient ID: Morgan Cisneros, female    DOB: 1966-01-22, 58 y.o.   MRN: [id number]  HPI: Morgan Cisneros is a 58 y.o. female  Chief Complaint  Patient presents with   Urinary Tract Infection    Patient says her labs came back abnormal at [facility] and was advised to go to the ER to have her kidneys elevated. But, patient says she did not feel like she needed to go to the ER and just went home and reviewed her previous labs from PCP.    Rash    Patient says she now noticed a rash that appeared about two weeks ago. Patient says it started on her legs and has now moved to her arms and hands. Patient says the rash isn't raised, just looks like little blood bumps. Patient wasn't prescribed any medication. Patient wanted a second opinion before going to ER.    HYPERTENSION  Hypertension status: controlled  Satisfied with current treatment? yes Duration of hypertension: chronic BP monitoring frequency:  rarely BP medication side effects:  unsure- has rash now Medication compliance: excellent compliance Previous BP meds:metoprolol , hydrochlorothiazide , lisinopril  Aspirin: no Recurrent headaches: no Visual changes: no Palpitations: no Dyspnea: no Chest pain: no Lower extremity edema: no Dizzy/lightheaded: no  RASH Duration:  2.5 weeks  Location: bilateral lower legs  Itching: no Burning: no Redness: yes Oozing: no Scaling: no Blisters: no Painful: no Fevers: no Change in detergents/soaps/personal care products: no Recent illness: yes Recent travel:no History of same: no Context: worse Alleviating factors: nothing Treatments attempted:nothing Shortness of breath: yes  Throat/tongue swelling: no Myalgias/arthralgias: no   Relevant past medical, surgical, family and social history reviewed and updated as indicated. Interim medical history since our last visit reviewed. Allergies and medications reviewed and  updated.  Review of Systems  Constitutional:  Positive for fatigue. Negative for activity change, appetite change, chills, diaphoresis, fever and unexpected weight change.  HENT: Negative.    Respiratory:  Positive for cough. Negative for apnea, choking, chest tightness, shortness of breath, wheezing and stridor.   Cardiovascular: Negative.   Skin:  Positive for rash. Negative for color change, pallor and wound.  Neurological: Negative.   Psychiatric/Behavioral: Negative.      Per HPI unless specifically indicated above     Objective:    There were no vitals taken for this visit.  Wt Readings from Last 3 Encounters:  02/21/23 146 lb 3.2 oz (66.3 kg)  01/24/23 153 lb 3.2 oz (69.5 kg)  12/30/22 150 lb 12.8 oz (68.4 kg)    Physical Exam Vitals and nursing note reviewed.  Constitutional:      General: She is not in acute distress.    Appearance: Normal appearance. She is normal weight. She is not ill-appearing, toxic-appearing or diaphoretic.  HENT:     Head: Normocephalic and atraumatic.     Right Ear: External ear normal.     Left Ear: External ear normal.     Nose: Nose normal.     Mouth/Throat:     Mouth: Mucous membranes are moist.     Pharynx: Oropharynx is clear.  Eyes:     General: No scleral icterus.       Right eye: No discharge.        Left eye: No discharge.     Conjunctiva/sclera: Conjunctivae normal.     Pupils: Pupils are equal, round, and reactive to light.  Pulmonary:  Effort: Pulmonary effort is normal. No respiratory distress.     Comments: Speaking in full sentences Musculoskeletal:        General: Normal range of motion.     Cervical back: Normal range of motion.  Skin:    Coloration: Skin is not jaundiced or pale.     Findings: No bruising, erythema, lesion or rash.  Neurological:     Mental Status: She is alert and oriented to person, place, and time. Mental status is at baseline.  Psychiatric:        Mood and Affect: Mood normal.         Behavior: Behavior normal.        Thought Content: Thought content normal.        Judgment: Judgment normal.     Results for orders placed or performed during the hospital encounter of 04/08/23  CBC with Differential   Collection Time: 04/08/23  5:26 PM  Result Value Ref Range   WBC 8.0 4.0 - 10.5 K/uL   RBC 3.79 (L) 3.87 - 5.11 MIL/uL   Hemoglobin 13.0 12.0 - 15.0 g/dL   HCT 62.0 63.9 - 53.9 %   MCV 100.0 80.0 - 100.0 fL   MCH 34.3 (H) 26.0 - 34.0 pg   MCHC 34.3 30.0 - 36.0 g/dL   RDW 86.0 88.4 - 84.4 %   Platelets 322 150 - 400 K/uL   nRBC 0.0 0.0 - 0.2 %   Neutrophils Relative % 57 %   Neutro Abs 4.5 1.7 - 7.7 K/uL   Lymphocytes Relative 34 %   Lymphs Abs 2.7 0.7 - 4.0 K/uL   Monocytes Relative 7 %   Monocytes Absolute 0.5 0.1 - 1.0 K/uL   Eosinophils Relative 2 %   Eosinophils Absolute 0.2 0.0 - 0.5 K/uL   Basophils Relative 0 %   Basophils Absolute 0.0 0.0 - 0.1 K/uL   Immature Granulocytes 0 %   Abs Immature Granulocytes 0.03 0.00 - 0.07 K/uL  Comprehensive metabolic panel   Collection Time: 04/08/23  5:26 PM  Result Value Ref Range   Sodium 137 135 - 145 mmol/L   Potassium 4.8 3.5 - 5.1 mmol/L   Chloride 105 98 - 111 mmol/L   CO2 21 (L) 22 - 32 mmol/L   Glucose, Bld 92 70 - 99 mg/dL   BUN 36 (H) 6 - 20 mg/dL   Creatinine, Ser 8.80 (H) 0.44 - 1.00 mg/dL   Calcium  9.8 8.9 - 10.3 mg/dL   Total Protein 7.8 6.5 - 8.1 g/dL   Albumin 4.5 3.5 - 5.0 g/dL   AST 31 15 - 41 U/L   ALT 37 0 - 44 U/L   Alkaline Phosphatase 114 38 - 126 U/L   Total Bilirubin 0.9 0.0 - 1.2 mg/dL   GFR, Estimated 54 (L) >60 mL/min   Anion gap 11 5 - 15  Urinalysis, w/ Reflex to Culture (Infection Suspected) -Urine, Clean Catch   Collection Time: 04/08/23  6:01 PM  Result Value Ref Range   Specimen Source URINE, CLEAN CATCH    Color, Urine YELLOW YELLOW   APPearance CLEAR CLEAR   Specific Gravity, Urine 1.025 1.005 - 1.030   pH 5.5 5.0 - 8.0   Glucose, UA NEGATIVE NEGATIVE mg/dL   Hgb  urine dipstick SMALL (A) NEGATIVE   Bilirubin Urine NEGATIVE NEGATIVE   Ketones, ur NEGATIVE NEGATIVE mg/dL   Protein, ur TRACE (A) NEGATIVE mg/dL   Nitrite NEGATIVE NEGATIVE   Leukocytes,Ua NEGATIVE NEGATIVE  Squamous Epithelial / HPF 6-10 0 - 5 /HPF   WBC, UA 0-5 0 - 5 WBC/hpf   RBC / HPF 0-5 0 - 5 RBC/hpf   Bacteria, UA FEW (A) NONE SEEN      Assessment & Plan:   Problem List Items Addressed This Visit       Cardiovascular and Mediastinum   Essential hypertension - Primary   Concern for side effects from hydrochlorothiazide . Will stop. Recheck 2-3 weeks.       Other Visit Diagnoses       Abnormal kidney function       Likely due to HCTZ. Will stop and recheck labs in 2-3 weeks.     Rash       Likely dor to HCTZ or viral. Will stop HCTZ and recheck in 2-3 weeks. If not better, consider punch biopsy.        Follow up plan: Return 2-3 weeks.    This visit was completed via video visit through MyChart due to the restrictions of the COVID-19 pandemic. All issues as above were discussed and addressed. Physical exam was done as above through visual confirmation on video through MyChart. If it was felt that the patient should be evaluated in the office, they were directed there. The patient verbally consented to this visit. Location of the patient: home Location of the provider: work Those involved with this call:  Provider: Duwaine Louder, DO CMA: Doyce Croak, CMA Front Desk/Registration:  Claretta Maiden   Time spent on call:  15 minutes with patient face to face via video conference. More than 50% of this time was spent in counseling and coordination of care. 23 minutes total spent in review of patient's record and preparation of their chart.

## 2023-04-13 ENCOUNTER — Ambulatory Visit: Payer: Self-pay

## 2023-04-13 NOTE — Telephone Encounter (Signed)
  Chief Complaint: rash Symptoms: rash on arms legs, hand and now face Frequency: 2 weeks  Pertinent Negatives: Patient denies any itching, pain or SOB Disposition: [] ED /[] Urgent Care (no appt availability in office) / [x] Appointment(In office/virtual)/ []  [place] [facility]/ [] Home Care/ [] Refused Recommended Disposition /[] [place] [facility]/ []  Follow-up with PCP Additional Notes: pt went to UC on 04/08/23, had VV with PCP on 04/11/23. Pt states rash is getting worse and now spread to face. She said that Dr Vicci told her if gets worse to call back. Scheduled OV for 04/14/23 at 1040 with PCP.   Reason for Disposition  Mild widespread rash  (Exception: Heat rash lasting 3 days or less.)  Answer Assessment - Initial Assessment Questions 1. APPEARANCE of RASH: Describe the rash. (e.g., spots, blisters, raised areas, skin peeling, scaly)     Dark red spots 2. SIZE: How big are the spots? (e.g., tip of pen, eraser, coin; inches, centimeters)     Tiny spots 3. LOCATION: Where is the rash located?     Arms legs, hands, now on face  5. ONSET: When did the rash begin?     2 week ago  6. FEVER: Do you have a fever? If Yes, ask: What is your temperature, how was it measured, and when did it start?     no 7. ITCHING: Does the rash itch? If Yes, ask: How bad is the itch? (Scale 1-10; or mild, moderate, severe)     No 9. MEDICINE FACTORS: Have you started any new medicines within the last 2 weeks? (e.g., antibiotics)      No, stopped hydrochlorothiazide  as advised  Protocols used: Rash or Redness - [facility]

## 2023-04-14 ENCOUNTER — Ambulatory Visit
Admission: RE | Admit: 2023-04-14 | Discharge: 2023-04-14 | Disposition: A | Discharge status: Home / Self Care | Payer: 59 | Source: Ambulatory Visit | Attending: Family Medicine | Admitting: Family Medicine

## 2023-04-14 ENCOUNTER — Encounter: Payer: Self-pay | Admitting: Family Medicine

## 2023-04-14 ENCOUNTER — Ambulatory Visit (INDEPENDENT_AMBULATORY_CARE_PROVIDER_SITE_OTHER): Payer: 59 | Admitting: Family Medicine

## 2023-04-14 VITALS — BP 94/59 | HR 71 | Wt 151.8 lb

## 2023-04-14 DIAGNOSIS — Z1231 Encounter for screening mammogram for malignant neoplasm of breast: Secondary | ICD-10-CM | POA: Diagnosis present

## 2023-04-14 DIAGNOSIS — R233 Spontaneous ecchymoses: Secondary | ICD-10-CM | POA: Diagnosis not present

## 2023-04-14 MED ORDER — DOXYCYCLINE HYCLATE 100 MG PO TABS: 100.0000 mg | ORAL_TABLET | Freq: Two times a day (BID) | ORAL | 0 refills | Status: DC

## 2023-04-14 NOTE — Progress Notes (Signed)
 BP (!) 94/59   Pulse 71   Wt 151 lb 12.8 oz (68.9 kg)   SpO2 97%   BMI 25.26 kg/m    Subjective:    Patient ID: Morgan Cisneros, female    DOB: 1966/01/16, 58 y.o.   MRN: [id number]  HPI: Morgan Cisneros is a 58 y.o. female  Chief Complaint  Patient presents with   Rash    Patient was recently seen for the rash, but says it has gotten worse. Patient declines the area being itchy. Patient says it has spread to from her legs up to her arms and legs. Patient denies changing of detergents or soaps. Patient says she first noticed the rash about 3 weeks ago.    Rash is getting worse, not better. It's spread from her legs to her arms. No itching. She's been feeling well other than feeling tired. Has not taken her BP medicine today. No other concerns.   Relevant past medical, surgical, family and social history reviewed and updated as indicated. Interim medical history since our last visit reviewed. Allergies and medications reviewed and updated.  Review of Systems  Constitutional:  Positive for fatigue. Negative for activity change, appetite change, chills, diaphoresis, fever and unexpected weight change.  Respiratory: Negative.    Cardiovascular: Negative.   Skin:  Positive for rash. Negative for color change, pallor and wound.  Psychiatric/Behavioral: Negative.      Per HPI unless specifically indicated above     Objective:    BP (!) 94/59   Pulse 71   Wt 151 lb 12.8 oz (68.9 kg)   SpO2 97%   BMI 25.26 kg/m   Wt Readings from Last 3 Encounters:  04/14/23 151 lb 12.8 oz (68.9 kg)  02/21/23 146 lb 3.2 oz (66.3 kg)  01/24/23 153 lb 3.2 oz (69.5 kg)    Physical Exam Vitals and nursing note reviewed.  Constitutional:      General: She is not in acute distress.    Appearance: Normal appearance. She is not ill-appearing, toxic-appearing or diaphoretic.  HENT:     Head: Normocephalic and atraumatic.     Right Ear: External ear normal.     Left Ear: External ear  normal.     Nose: Nose normal.     Mouth/Throat:     Mouth: Mucous membranes are moist.     Pharynx: Oropharynx is clear.  Eyes:     General: No scleral icterus.       Right eye: No discharge.        Left eye: No discharge.     Extraocular Movements: Extraocular movements intact.     Conjunctiva/sclera: Conjunctivae normal.     Pupils: Pupils are equal, round, and reactive to light.  Cardiovascular:     Rate and Rhythm: Normal rate and regular rhythm.     Pulses: Normal pulses.     Heart sounds: Normal heart sounds. No murmur heard.    No friction rub. No gallop.  Pulmonary:     Effort: Pulmonary effort is normal. No respiratory distress.     Breath sounds: Normal breath sounds. No stridor. No wheezing, rhonchi or rales.  Chest:     Chest wall: No tenderness.  Musculoskeletal:        General: Normal range of motion.     Cervical back: Normal range of motion and neck supple.  Skin:    General: Skin is warm and dry.     Capillary Refill: Capillary refill takes less than  2 seconds.     Coloration: Skin is not jaundiced or pale.     Findings: Rash (non blanching petechial rash on arms and legs) present. No bruising, erythema or lesion.  Neurological:     General: No focal deficit present.     Mental Status: She is alert and oriented to person, place, and time. Mental status is at baseline.  Psychiatric:        Mood and Affect: Mood normal.        Behavior: Behavior normal.        Thought Content: Thought content normal.        Judgment: Judgment normal.     Results for orders placed or performed during the hospital encounter of 04/08/23  CBC with Differential   Collection Time: 04/08/23  5:26 PM  Result Value Ref Range   WBC 8.0 4.0 - 10.5 K/uL   RBC 3.79 (L) 3.87 - 5.11 MIL/uL   Hemoglobin 13.0 12.0 - 15.0 g/dL   HCT 62.0 63.9 - 53.9 %   MCV 100.0 80.0 - 100.0 fL   MCH 34.3 (H) 26.0 - 34.0 pg   MCHC 34.3 30.0 - 36.0 g/dL   RDW 86.0 88.4 - 84.4 %   Platelets 322 150 -  400 K/uL   nRBC 0.0 0.0 - 0.2 %   Neutrophils Relative % 57 %   Neutro Abs 4.5 1.7 - 7.7 K/uL   Lymphocytes Relative 34 %   Lymphs Abs 2.7 0.7 - 4.0 K/uL   Monocytes Relative 7 %   Monocytes Absolute 0.5 0.1 - 1.0 K/uL   Eosinophils Relative 2 %   Eosinophils Absolute 0.2 0.0 - 0.5 K/uL   Basophils Relative 0 %   Basophils Absolute 0.0 0.0 - 0.1 K/uL   Immature Granulocytes 0 %   Abs Immature Granulocytes 0.03 0.00 - 0.07 K/uL  Comprehensive metabolic panel   Collection Time: 04/08/23  5:26 PM  Result Value Ref Range   Sodium 137 135 - 145 mmol/L   Potassium 4.8 3.5 - 5.1 mmol/L   Chloride 105 98 - 111 mmol/L   CO2 21 (L) 22 - 32 mmol/L   Glucose, Bld 92 70 - 99 mg/dL   BUN 36 (H) 6 - 20 mg/dL   Creatinine, Ser 8.80 (H) 0.44 - 1.00 mg/dL   Calcium  9.8 8.9 - 10.3 mg/dL   Total Protein 7.8 6.5 - 8.1 g/dL   Albumin 4.5 3.5 - 5.0 g/dL   AST 31 15 - 41 U/L   ALT 37 0 - 44 U/L   Alkaline Phosphatase 114 38 - 126 U/L   Total Bilirubin 0.9 0.0 - 1.2 mg/dL   GFR, Estimated 54 (L) >60 mL/min   Anion gap 11 5 - 15  Urinalysis, w/ Reflex to Culture (Infection Suspected) -Urine, Clean Catch   Collection Time: 04/08/23  6:01 PM  Result Value Ref Range   Specimen Source URINE, CLEAN CATCH    Color, Urine YELLOW YELLOW   APPearance CLEAR CLEAR   Specific Gravity, Urine 1.025 1.005 - 1.030   pH 5.5 5.0 - 8.0   Glucose, UA NEGATIVE NEGATIVE mg/dL   Hgb urine dipstick SMALL (A) NEGATIVE   Bilirubin Urine NEGATIVE NEGATIVE   Ketones, ur NEGATIVE NEGATIVE mg/dL   Protein, ur TRACE (A) NEGATIVE mg/dL   Nitrite NEGATIVE NEGATIVE   Leukocytes,Ua NEGATIVE NEGATIVE   Squamous Epithelial / HPF 6-10 0 - 5 /HPF   WBC, UA 0-5 0 - 5 WBC/hpf   RBC /  HPF 0-5 0 - 5 RBC/hpf   Bacteria, UA FEW (A) NONE SEEN      Assessment & Plan:   Problem List Items Addressed This Visit   None Visit Diagnoses       Petechial rash    -  Primary   Concern for RMSF. Will check labs and treat with doxycycline .  Recheck next week. Hold BP meds due to hypotension.   Relevant Orders   CBC with Differential/Platelet   Basic metabolic panel   Lyme Disease Serology w/Reflex   Spotted Fever Group Antibodies   Ehrlichia Antibody Panel        Follow up plan: Return next week.

## 2023-04-15 ENCOUNTER — Encounter: Payer: Self-pay | Admitting: Family Medicine

## 2023-04-18 ENCOUNTER — Ambulatory Visit: Payer: Self-pay | Admitting: *Deleted

## 2023-04-18 NOTE — Telephone Encounter (Signed)
 FYI: Has a mychart appt 04/19/2023 at 9:20 AM

## 2023-04-18 NOTE — Telephone Encounter (Signed)
 Chief Complaint: positive covid at home test yesterday  with sx. Medication questions Symptoms: dry cough, congestion in chest, SOB with coughing. Headaches, lethargy. Feels/ sounds like wet in chest from coughing but not coughing up phlegm. Recently started on medication doxycyline for rash. Stopped medication until PCP recommends to restart.  Frequency: 03-29-24 04/17/23. Pertinent Negatives: Patient denies chest pain no difficulty breathing no fever  Disposition: [] ED /[] Urgent Care (no appt availability in office) / [x] Appointment(In office/virtual)/ []  [place] [facility]/ [] Home Care/ [] Refused Recommended Disposition /[] [place] [facility]/ []  Follow-up with PCP Additional Notes:   My chart VV scheduled for tomorrow with PCP. Please advise if patient should continue doxycyline recently prescribed by PCP.  Patient will check my chart messages for a response until tomorrow. Please advise .   Summary: Covvid Positive   The patient called in stating she has been feeling well since Saturday. She just saw her provider last Thursday but was feeling fine then. She complains of congestion, cough, headache, lethargy. She took a test for flu and Covid and it only came up positive for Covid. She states her husband passed away if March 29, 2020 from Covid and she is very nervous. She doesn't know what to do. Her provider doesn't have an appt any time soon and she doesn't know if she should be on the Paxlovid  or what. Please assist patient further.             Reason for Disposition  [1] HIGH RISK patient (e.g., weak immune system, age > 64 years, obesity with BMI 30 or higher, pregnant, chronic lung disease or other chronic medical condition) AND [2] COVID symptoms (e.g., cough, fever)  (Exceptions: Already seen by PCP and no new or worsening symptoms.)  Answer Assessment - Initial Assessment Questions 1. COVID-19 DIAGNOSIS: Who made your COVID-19 diagnosis? Was it confirmed by a positive lab test?       At home test positive for covid yesterday 04/17/23 2. COVID-19 EXPOSURE: Was there any known exposure to COVID-19 before the symptoms began? Household exposure or close contact with positive COVID-19 patient outside the home (child care, school, work, play or sports).  CDC Definition of close contact: within 6 feet (2 meters) for a total of 15 minutes or more over a 24-hour period.      Na  3. ONSET: When did the COVID-19 symptoms start?      2024-03-29 morning 04/17/23  4. WORST SYMPTOM: What is your child's worst symptom?      Cough  5. COUGH: Does your child have a cough? If so, ask, How bad is the cough?       Dry cough now but sound congested  6. RESPIRATORY DISTRESS: Describe your child's breathing. What does it sound like? (e.g., wheezing, stridor, grunting, weak cry, unable to speak, retractions, rapid rate, cyanosis)     Wheezing at night , SOB with coughing  7. BETTER-SAME-WORSE: Is your child getting better, staying the same or getting worse compared to yesterday?  If getting worse, ask, In what way?     worse 8. FEVER: Does your child have a fever? If so, ask: What is it, how was it measured, and how long has it been present?      no 9. OTHER SYMPTOMS: Does your child have any other symptoms? (e.g., chills or shaking, sore throat, muscle pains, headache, loss of smell)      Headache, cough congestion lethargy  10. CHILD'S APPEARANCE: How sick is your child acting?  What  is he doing right now? If asleep, ask: How was he acting before he went to sleep?         na 11. HIGHER RISK for COMPLICATIONS with FLU or COVID-19 : Does your child have any chronic medical problems? (e.g., heart or lung disease, diabetes, asthma, cancer, weak immune system, etc. See that List in Background Information.  Reason: may need antiviral if has positive test for influenza.)        na 12. VACCINES:  Is your child vaccinated against COVID-19? If so,What vaccine [place],  Moderna, [place] and [place]) did they receive? Have they received a booster shot? Fully Vaccinated definition (CDC):  Person has completed primary vaccine series and received a booster shot OR has completed primary vaccine series within the last 5 months and not yet eligible for booster shot.  Other people are either unvaccinated or partially vaccinated.        na   Note to Triager - Respiratory Distress: Always rule out respiratory distress (also known as working hard to breathe or shortness of breath). Listen for grunting, stridor, wheezing, tachypnea in these calls. How to assess: Listen to the child's breathing early in your assessment. Reason: What you hear is often more valid than the caller's answers to your triage questions.  Answer Assessment - Initial Assessment Questions 1. COVID-19 DIAGNOSIS: How do you know that you have COVID? (e.g., positive lab test or self-test, diagnosed by doctor or NP/PA, symptoms after exposure).     Positive covid test at home Sunday  04/17/23 2. COVID-19 EXPOSURE: Was there any known exposure to COVID before the symptoms began? CDC Definition of close contact: within 6 feet (2 meters) for a total of 15 minutes or more over a 24-hour period.      Na  3. ONSET: When did the COVID-19 symptoms start?      Sunday morning 04/17/23 4. WORST SYMPTOM: What is your worst symptom? (e.g., cough, fever, shortness of breath, muscle aches)     cough 5. COUGH: Do you have a cough? If Yes, ask: How bad is the cough?       Dry cough now. Congestion sounds worse 6. FEVER: Do you have a fever? If Yes, ask: What is your temperature, how was it measured, and when did it start?     no 7. RESPIRATORY STATUS: Describe your breathing? (e.g., normal; shortness of breath, wheezing, unable to speak)      Shortness of breath with coughing  8. BETTER-SAME-WORSE: Are you getting better, staying the same or getting worse compared to yesterday?  If getting worse,  ask, In what way?     Worse  9. OTHER SYMPTOMS: Do you have any other symptoms?  (e.g., chills, fatigue, headache, loss of smell or taste, muscle pain, sore throat)     Headache , cough congestion, lethargy. Dry cough , feels / sounds like wet cough but not coughing up  10. HIGH RISK DISEASE: Do you have any chronic medical problems? (e.g., asthma, heart or lung disease, weak immune system, obesity, etc.)       na 11. VACCINE: Have you had the COVID-19 vaccine? If Yes, ask: Which one, how many shots, when did you get it?       na 12. PREGNANCY: Is there any chance you are pregnant? When was your last menstrual period?       na 13. O2 SATURATION MONITOR:  Do you use an oxygen saturation monitor (pulse oximeter) at home? If Yes, ask What  is your reading (oxygen level) today? What is your usual oxygen saturation reading? (e.g., 95%)       na  Protocols used: Coronavirus (COVID-19) Diagnosed or Suspected-P-AH, Coronavirus (COVID-19) Diagnosed or Suspected-A-AH

## 2023-04-19 ENCOUNTER — Telehealth (INDEPENDENT_AMBULATORY_CARE_PROVIDER_SITE_OTHER): Payer: 59 | Admitting: Family Medicine

## 2023-04-19 ENCOUNTER — Encounter: Payer: Self-pay | Admitting: Family Medicine

## 2023-04-19 DIAGNOSIS — U071 COVID-19: Secondary | ICD-10-CM

## 2023-04-19 LAB — CBC WITH DIFFERENTIAL/PLATELET
Basophils Absolute: 0 10*3/uL (ref 0.0–0.2)
Basos: 1 %
EOS (ABSOLUTE): 0.2 10*3/uL (ref 0.0–0.4)
Eos: 3 %
Hematocrit: 35.3 % (ref 34.0–46.6)
Hemoglobin: 12.1 g/dL (ref 11.1–15.9)
Immature Grans (Abs): 0 10*3/uL (ref 0.0–0.1)
Immature Granulocytes: 0 %
Lymphocytes Absolute: 3.1 10*3/uL (ref 0.7–3.1)
Lymphs: 42 %
MCH: 34 pg — ABNORMAL HIGH (ref 26.6–33.0)
MCHC: 34.3 g/dL (ref 31.5–35.7)
MCV: 99 fL — ABNORMAL HIGH (ref 79–97)
Monocytes Absolute: 0.3 10*3/uL (ref 0.1–0.9)
Monocytes: 5 %
Neutrophils Absolute: 3.7 10*3/uL (ref 1.4–7.0)
Neutrophils: 49 %
Platelets: 358 10*3/uL (ref 150–450)
RBC: 3.56 x10E6/uL — ABNORMAL LOW (ref 3.77–5.28)
RDW: 13.3 % (ref 11.7–15.4)
WBC: 7.4 10*3/uL (ref 3.4–10.8)

## 2023-04-19 LAB — BASIC METABOLIC PANEL
BUN/Creatinine Ratio: 21 (ref 9–23)
BUN: 30 mg/dL — ABNORMAL HIGH (ref 6–24)
CO2: 16 mmol/L — ABNORMAL LOW (ref 20–29)
Calcium: 9.3 mg/dL (ref 8.7–10.2)
Chloride: 99 mmol/L (ref 96–106)
Creatinine, Ser: 1.42 mg/dL — ABNORMAL HIGH (ref 0.57–1.00)
Glucose: 79 mg/dL (ref 70–99)
Potassium: 4.6 mmol/L (ref 3.5–5.2)
Sodium: 138 mmol/L (ref 134–144)
eGFR: 43 mL/min/{1.73_m2} — ABNORMAL LOW (ref >59)

## 2023-04-19 LAB — EHRLICHIA ANTIBODY PANEL

## 2023-04-19 LAB — LYME DISEASE SEROLOGY W/REFLEX: Lyme Total Antibody EIA: NEGATIVE

## 2023-04-19 LAB — SPOTTED FEVER GROUP ANTIBODIES

## 2023-04-19 MED ORDER — NIRMATRELVIR/RITONAVIR (PAXLOVID)TABLET: 3.0000 | ORAL_TABLET | Freq: Two times a day (BID) | ORAL | 0 refills | Status: AC

## 2023-04-19 MED ORDER — PREDNISONE 10 MG PO TABS: ORAL_TABLET | ORAL | 0 refills | Status: DC

## 2023-04-19 MED ORDER — HYDROCOD POLI-CHLORPHE POLI ER 10-8 MG/5ML PO SUER: 5.0000 mL | Freq: Two times a day (BID) | ORAL | 0 refills | Status: DC | PRN

## 2023-04-19 NOTE — Progress Notes (Signed)
 There were no vitals taken for this visit.   Subjective:    Patient ID: Morgan Cisneros, female    DOB: [birth year], 58 y.o.   MRN: [id number]  HPI: Morgan Cisneros is a 58 y.o. female  Chief Complaint  Patient presents with   Covid Positive    Patient says tested for COVID on Sunday. Patient says she is having fever on early Sunday morning. Patient says she has been taking over the counter Tylenol . Patient says she was prescribed Doxycycline  and stopped due to illness.    Fever   Chills   Generalized Body Aches   Cough   UPPER RESPIRATORY TRACT INFECTION Duration: 2 days Worst symptom: fevers, chills, body aches Fever: yes Cough: yes Shortness of breath: yes Wheezing: yes Chest pain: no Chest tightness: no Chest congestion: no Nasal congestion: no Runny nose: no Post nasal drip: no Sneezing: no Sore throat: no Swollen glands: no Sinus pressure: yes Headache: yes Face pain: no Toothache: no Ear pain: no  Ear pressure: no bilateral Eyes red/itching:no Eye drainage/crusting: no  Vomiting: no Rash: yes Fatigue: yes Sick contacts: no Strep contacts: no  Context: worse Recurrent sinusitis: no Relief with OTC cold/cough medications: no  Treatments attempted: tylenol    Relevant past medical, surgical, family and social history reviewed and updated as indicated. Interim medical history since our last visit reviewed. Allergies and medications reviewed and updated.  Review of Systems  Constitutional:  Positive for chills, diaphoresis, fatigue and fever. Negative for activity change, appetite change and unexpected weight change.  HENT:  Negative for congestion, dental problem, drooling, ear discharge, ear pain, facial swelling, hearing loss, mouth sores, nosebleeds, postnasal drip, rhinorrhea, sinus pressure, sinus pain, sneezing, sore throat, tinnitus, trouble swallowing and voice change.   Respiratory:  Positive for cough, shortness of breath and wheezing.  Negative for apnea, choking, chest tightness and stridor.   Cardiovascular: Negative.   Gastrointestinal: Negative.   Psychiatric/Behavioral: Negative.      Per HPI unless specifically indicated above     Objective:    There were no vitals taken for this visit.  Wt Readings from Last 3 Encounters:  04/14/23 151 lb 12.8 oz (68.9 kg)  02/21/23 146 lb 3.2 oz (66.3 kg)  01/24/23 153 lb 3.2 oz (69.5 kg)    Physical Exam Vitals and nursing note reviewed.  Constitutional:      General: She is not in acute distress.    Appearance: Normal appearance. She is not ill-appearing, toxic-appearing or diaphoretic.  HENT:     Head: Normocephalic and atraumatic.     Right Ear: External ear normal.     Left Ear: External ear normal.     Nose: Nose normal.     Mouth/Throat:     Mouth: Mucous membranes are moist.     Pharynx: Oropharynx is clear.  Eyes:     General: No scleral icterus.       Right eye: No discharge.        Left eye: No discharge.     Conjunctiva/sclera: Conjunctivae normal.     Pupils: Pupils are equal, round, and reactive to light.  Pulmonary:     Effort: Pulmonary effort is normal. No respiratory distress.     Comments: Speaking in full sentences Musculoskeletal:        General: Normal range of motion.     Cervical back: Normal range of motion.  Skin:    Coloration: Skin is not jaundiced or pale.  Findings: No bruising, erythema, lesion or rash.  Neurological:     Mental Status: She is alert and oriented to person, place, and time. Mental status is at baseline.  Psychiatric:        Mood and Affect: Mood normal.        Behavior: Behavior normal.        Thought Content: Thought content normal.        Judgment: Judgment normal.     Results for orders placed or performed in visit on 04/14/23  CBC with Differential/Platelet   Collection Time: 04/14/23 11:10 AM  Result Value Ref Range   WBC 7.4 3.4 - 10.8 x10E3/uL   RBC 3.56 (L) 3.77 - 5.28 x10E6/uL    Hemoglobin 12.1 11.1 - 15.9 g/dL   Hematocrit 64.6 65.9 - 46.6 %   MCV 99 (H) 79 - 97 fL   MCH 34.0 (H) 26.6 - 33.0 pg   MCHC 34.3 31.5 - 35.7 g/dL   RDW 86.6 88.2 - 84.5 %   Platelets 358 150 - 450 x10E3/uL   Neutrophils 49 Not Estab. %   Lymphs 42 Not Estab. %   Monocytes 5 Not Estab. %   Eos 3 Not Estab. %   Basos 1 Not Estab. %   Neutrophils Absolute 3.7 1.4 - 7.0 x10E3/uL   Lymphocytes Absolute 3.1 0.7 - 3.1 x10E3/uL   Monocytes Absolute 0.3 0.1 - 0.9 x10E3/uL   EOS (ABSOLUTE) 0.2 0.0 - 0.4 x10E3/uL   Basophils Absolute 0.0 0.0 - 0.2 x10E3/uL   Immature Granulocytes 0 Not Estab. %   Immature Grans (Abs) 0.0 0.0 - 0.1 x10E3/uL  Basic metabolic panel   Collection Time: 04/14/23 11:10 AM  Result Value Ref Range   Glucose 79 70 - 99 mg/dL   BUN 30 (H) 6 - 24 mg/dL   Creatinine, Ser 8.57 (H) 0.57 - 1.00 mg/dL   eGFR 43 (L) >40 fO/fpw/8.26   BUN/Creatinine Ratio 21 9 - 23   Sodium 138 134 - 144 mmol/L   Potassium 4.6 3.5 - 5.2 mmol/L   Chloride 99 96 - 106 mmol/L   CO2 16 (L) 20 - 29 mmol/L   Calcium  9.3 8.7 - 10.2 mg/dL  Lyme Disease Serology w/Reflex   Collection Time: 04/14/23 11:10 AM  Result Value Ref Range   Lyme Total Antibody EIA Negative Negative  Spotted Fever Group Antibodies   Collection Time: 04/14/23 11:10 AM  Result Value Ref Range   Spotted Fever Group IgG <1:64 Neg:<1:64   Spotted Fever Group IgM <1:64 Neg:<1:64   Result Comment Comment   Ehrlichia Antibody Panel   Collection Time: 04/14/23 11:10 AM  Result Value Ref Range   E.Chaffeensis (HME) IgG Negative Neg:<1:64   E. Chaffeensis (HME) IgM Titer Negative Neg:<1:20   HGE IgG Titer Negative Neg:<1:64   HGE IgM Titer Negative Neg:<1:20   Result Comment: Comment       Assessment & Plan:   Problem List Items Addressed This Visit   None Visit Diagnoses       COVID    -  Primary   Will treat with paxlovid  and steroids. Call with any concern or getting worse. Continue to monitor.   Relevant  Medications   nirmatrelvir /ritonavir  (PAXLOVID ) 20 x 150 MG & 10 x 100MG  TABS        Follow up plan: Return for As scheduled.   This visit was completed via video visit through MyChart due to the restrictions of the COVID-19 pandemic. All issues  as above were discussed and addressed. Physical exam was done as above through visual confirmation on video through MyChart. If it was felt that the patient should be evaluated in the office, they were directed there. The patient verbally consented to this visit. Location of the patient: home Location of the provider: work Those involved with this call:  Provider: Duwaine Louder, DO CMA: Doyce Croak, CMA Front Desk/Registration: Katina Slade  Time spent on call:  15 minutes with patient face to face via video conference. More than 50% of this time was spent in counseling and coordination of care. 23 minutes total spent in review of patient's record and preparation of their chart.

## 2023-04-25 ENCOUNTER — Ambulatory Visit: Payer: 59 | Admitting: Family Medicine

## 2023-04-28 ENCOUNTER — Encounter: Payer: Self-pay | Admitting: Family Medicine

## 2023-04-28 ENCOUNTER — Ambulatory Visit: Payer: 59 | Admitting: Family Medicine

## 2023-04-28 VITALS — BP 152/98 | HR 73 | Temp 97.3°F | Wt 154.2 lb

## 2023-04-28 DIAGNOSIS — R233 Spontaneous ecchymoses: Secondary | ICD-10-CM | POA: Diagnosis not present

## 2023-04-28 DIAGNOSIS — I10 Essential (primary) hypertension: Secondary | ICD-10-CM | POA: Diagnosis not present

## 2023-04-28 DIAGNOSIS — N289 Disorder of kidney and ureter, unspecified: Secondary | ICD-10-CM

## 2023-04-28 MED ORDER — LISINOPRIL 40 MG PO TABS: 20.0000 mg | ORAL_TABLET | Freq: Every day | ORAL | Status: DC

## 2023-04-28 NOTE — Progress Notes (Signed)
BP (!) 152/98   Pulse 73   Temp (!) 97.3 F (36.3 C) (Oral)   Wt 154 lb 3.2 oz (69.9 kg)   SpO2 99%   BMI 25.66 kg/m    Subjective:    Patient ID: Morgan Cisneros, female    DOB: 08-Aug-1965, 58 y.o.   MRN: [id number]  HPI: Morgan Cisneros is a 58 y.o. female  Chief Complaint  Patient presents with   Rash   Rash is almost entirely resolved. Feeling better from her covid.   HYPERTENSION  Hypertension status: uncontrolled  Satisfied with current treatment? no Duration of hypertension: chronic BP monitoring frequency:  rarely BP medication side effects:  no Medication compliance: excellent compliance Previous BP meds:hydrochlorothiazide, metoprolol, lisinopril Aspirin: no Recurrent headaches: no Visual changes: no Palpitations: no Dyspnea: no Chest pain: no Lower extremity edema: no Dizzy/lightheaded: no   Relevant past medical, surgical, family and social history reviewed and updated as indicated. Interim medical history since our last visit reviewed. Allergies and medications reviewed and updated.  Review of Systems  Constitutional: Negative.   Respiratory: Negative.    Cardiovascular: Negative.   Musculoskeletal: Negative.   Neurological: Negative.   Psychiatric/Behavioral: Negative.      Per HPI unless specifically indicated above     Objective:    BP (!) 152/98   Pulse 73   Temp (!) 97.3 F (36.3 C) (Oral)   Wt 154 lb 3.2 oz (69.9 kg)   SpO2 99%   BMI 25.66 kg/m   Wt Readings from Last 3 Encounters:  04/28/23 154 lb 3.2 oz (69.9 kg)  04/14/23 151 lb 12.8 oz (68.9 kg)  02/21/23 146 lb 3.2 oz (66.3 kg)    Physical Exam Vitals and nursing note reviewed.  Constitutional:      General: She is not in acute distress.    Appearance: Normal appearance. She is not ill-appearing, toxic-appearing or diaphoretic.  HENT:     Head: Normocephalic and atraumatic.     Right Ear: External ear normal.     Left Ear: External ear normal.     Nose:  Nose normal.     Mouth/Throat:     Mouth: Mucous membranes are moist.     Pharynx: Oropharynx is clear.  Eyes:     General: No scleral icterus.       Right eye: No discharge.        Left eye: No discharge.     Extraocular Movements: Extraocular movements intact.     Conjunctiva/sclera: Conjunctivae normal.     Pupils: Pupils are equal, round, and reactive to light.  Cardiovascular:     Rate and Rhythm: Normal rate and regular rhythm.     Pulses: Normal pulses.     Heart sounds: Normal heart sounds. No murmur heard.    No friction rub. No gallop.  Pulmonary:     Effort: Pulmonary effort is normal. No respiratory distress.     Breath sounds: Normal breath sounds. No stridor. No wheezing, rhonchi or rales.  Chest:     Chest wall: No tenderness.  Musculoskeletal:        General: Normal range of motion.     Cervical back: Normal range of motion and neck supple.  Skin:    General: Skin is warm and dry.     Capillary Refill: Capillary refill takes less than 2 seconds.     Coloration: Skin is not jaundiced or pale.     Findings: No bruising, erythema, lesion or rash.  Neurological:     General: No focal deficit present.     Mental Status: She is alert and oriented to person, place, and time. Mental status is at baseline.  Psychiatric:        Mood and Affect: Mood normal.        Behavior: Behavior normal.        Thought Content: Thought content normal.        Judgment: Judgment normal.     Results for orders placed or performed in visit on 04/28/23  Basic metabolic panel   Collection Time: 04/28/23 10:40 AM  Result Value Ref Range   Glucose 107 (H) 70 - 99 mg/dL   BUN 13 6 - 24 mg/dL   Creatinine, Ser 0.98 0.57 - 1.00 mg/dL   eGFR 119 >14 NW/GNF/6.21   BUN/Creatinine Ratio 20 9 - 23   Sodium 139 134 - 144 mmol/L   Potassium 3.7 3.5 - 5.2 mmol/L   Chloride 99 96 - 106 mmol/L   CO2 23 20 - 29 mmol/L   Calcium 8.8 8.7 - 10.2 mg/dL      Assessment & Plan:   Problem List  Items Addressed This Visit       Cardiovascular and Mediastinum   Essential hypertension - Primary   BP running high after covid- will restart lower dose of lisinopril and recheck in 2 weeks. Call with any concerns.       Relevant Medications   lisinopril (ZESTRIL) 40 MG tablet   Other Relevant Orders   Basic metabolic panel (Completed)   Other Visit Diagnoses       Abnormal kidney function       Rechecking labs today. Await results.   Relevant Orders   Basic metabolic panel (Completed)     Petechial rash       Resolved.        Follow up plan: Return in about 2 weeks (around 05/12/2023).

## 2023-04-29 ENCOUNTER — Encounter: Payer: Self-pay | Admitting: Family Medicine

## 2023-04-29 LAB — BASIC METABOLIC PANEL
BUN/Creatinine Ratio: 20 (ref 9–23)
BUN: 13 mg/dL (ref 6–24)
CO2: 23 mmol/L (ref 20–29)
Calcium: 8.8 mg/dL (ref 8.7–10.2)
Chloride: 99 mmol/L (ref 96–106)
Creatinine, Ser: 0.65 mg/dL (ref 0.57–1.00)
Glucose: 107 mg/dL — ABNORMAL HIGH (ref 70–99)
Potassium: 3.7 mmol/L (ref 3.5–5.2)
Sodium: 139 mmol/L (ref 134–144)
eGFR: 103 mL/min/{1.73_m2} (ref >59)

## 2023-05-01 ENCOUNTER — Encounter: Payer: Self-pay | Admitting: Family Medicine

## 2023-05-01 NOTE — Assessment & Plan Note (Signed)
BP running high after covid- will restart lower dose of lisinopril and recheck in 2 weeks. Call with any concerns.

## 2023-05-04 ENCOUNTER — Encounter: Payer: Self-pay | Admitting: Family Medicine

## 2023-05-15 MED ORDER — HYDROCOD POLI-CHLORPHE POLI ER 10-8 MG/5ML PO SUER: 5.0000 mL | Freq: Two times a day (BID) | ORAL | 0 refills | Status: DC | PRN

## 2023-05-24 ENCOUNTER — Ambulatory Visit: Payer: 59 | Admitting: Family Medicine

## 2023-05-24 VITALS — BP 136/89 | HR 69 | Temp 97.8°F | Ht 65.0 in | Wt 155.4 lb

## 2023-05-24 DIAGNOSIS — I10 Essential (primary) hypertension: Secondary | ICD-10-CM | POA: Diagnosis not present

## 2023-05-24 DIAGNOSIS — F419 Anxiety disorder, unspecified: Secondary | ICD-10-CM | POA: Diagnosis not present

## 2023-05-24 DIAGNOSIS — Z23 Encounter for immunization: Secondary | ICD-10-CM

## 2023-05-24 MED ORDER — LISINOPRIL 20 MG PO TABS: 20.0000 mg | ORAL_TABLET | Freq: Every day | ORAL | 1 refills | Status: DC

## 2023-05-24 NOTE — Progress Notes (Unsigned)
BP 136/89 (BP Location: Right Arm, Patient Position: Sitting, Cuff Size: Normal)   Pulse 69   Temp 97.8 F (36.6 C) (Oral)   Ht 5\' 5"  (1.651 m)   Wt 155 lb 6.4 oz (70.5 kg)   SpO2 98%   BMI 25.86 kg/m    Subjective:    Patient ID: Morgan Cisneros, female    DOB: 03-09-1966, 58 y.o.   MRN: [id number]  HPI: Morgan Cisneros is a 58 y.o. female  Chief Complaint  Patient presents with  . Hypertension    No concerns   HYPERTENSION  Hypertension status: {Blank single:19197::"controlled","uncontrolled","better","worse","exacerbated","stable"}  Satisfied with current treatment? {Blank single:19197::"yes","no"} Duration of hypertension: {Blank single:19197::"chronic","months","years"} BP monitoring frequency:  {Blank single:19197::"not checking","rarely","daily","weekly","monthly","a few times a day","a few times a week","a few times a month"} BP range:  BP medication side effects:  {Blank single:19197::"yes","no"} Medication compliance: {Blank single:19197::"excellent compliance","good compliance","fair compliance","poor compliance"} Previous BP meds:{Blank multiple:19196::"none","amlodipine","amlodipine/benazepril","atenolol","benazepril","benazepril/HCTZ","bisoprolol (bystolic)","carvedilol","chlorthalidone","clonidine","diltiazem","exforge HCT","HCTZ","irbesartan (avapro)","labetalol","lisinopril","lisinopril-HCTZ","losartan (cozaar)","methyldopa","nifedipine","olmesartan (benicar)","olmesartan-HCTZ","quinapril","ramipril","spironalactone","tekturna","valsartan","valsartan-HCTZ","verapamil"} Aspirin: {Blank single:19197::"yes","no"} Recurrent headaches: {Blank single:19197::"yes","no"} Visual changes: {Blank single:19197::"yes","no"} Palpitations: {Blank single:19197::"yes","no"} Dyspnea: {Blank single:19197::"yes","no"} Chest pain: {Blank single:19197::"yes","no"} Lower extremity edema: {Blank single:19197::"yes","no"} Dizzy/lightheaded: {Blank  single:19197::"yes","no"}  ANXIETY/STRESS Duration:{Blank single:19197::"controlled","uncontrolled","better","worse","exacerbated","stable"} Anxious mood: {Blank single:19197::"yes","no"}  Excessive worrying: {Blank single:19197::"yes","no"} Irritability: {Blank single:19197::"yes","no"}  Sweating: {Blank single:19197::"yes","no"} Nausea: {Blank single:19197::"yes","no"} Palpitations:{Blank single:19197::"yes","no"} Hyperventilation: {Blank single:19197::"yes","no"} Panic attacks: {Blank single:19197::"yes","no"} Agoraphobia: {Blank single:19197::"yes","no"}  Obscessions/compulsions: {Blank single:19197::"yes","no"} Depressed mood: {Blank single:19197::"yes","no"}    05/24/2023    4:33 PM 04/28/2023   10:23 AM 04/14/2023   10:37 AM 02/21/2023    1:24 PM 01/24/2023    2:25 PM  Depression screen PHQ 2/9  Decreased Interest 1 1 1 1 2   Down, Depressed, Hopeless 1 1 1 1 2   PHQ - 2 Score 2 2 2 2 4   Altered sleeping 2 3 1 1 2   Tired, decreased energy 2 2 1 1 2   Change in appetite 0 0 0 0 0  Feeling bad or failure about yourself  1 1 1 1 2   Trouble concentrating 2 2 1 1 2   Moving slowly or fidgety/restless 0 0 0 0 0  Suicidal thoughts 0 0 0 0 0  PHQ-9 Score 9 10 6 6 12   Difficult doing work/chores Not difficult at all Somewhat difficult Not difficult at all Somewhat difficult    Anhedonia: {Blank single:19197::"yes","no"} Weight changes: {Blank single:19197::"yes","no"} Insomnia: {Blank single:19197::"yes","no"} {Blank single:19197::"hard to fall asleep","hard to stay asleep"}  Hypersomnia: {Blank single:19197::"yes","no"} Fatigue/loss of energy: {Blank single:19197::"yes","no"} Feelings of worthlessness: {Blank single:19197::"yes","no"} Feelings of guilt: {Blank single:19197::"yes","no"} Impaired concentration/indecisiveness: {Blank single:19197::"yes","no"} Suicidal ideations: {Blank single:19197::"yes","no"}  Crying spells: {Blank single:19197::"yes","no"} Recent Stressors/Life  Changes: {Blank single:19197::"yes","no"}   Relationship problems: {Blank single:19197::"yes","no"}   Family stress: {Blank single:19197::"yes","no"}     Financial stress: {Blank single:19197::"yes","no"}    Job stress: {Blank single:19197::"yes","no"}    Recent death/loss: {Blank single:19197::"yes","no"}   Relevant past medical, surgical, family and social history reviewed and updated as indicated. Interim medical history since our last visit reviewed. Allergies and medications reviewed and updated.  Review of Systems  Per HPI unless specifically indicated above     Objective:    BP 136/89 (BP Location: Right Arm, Patient Position: Sitting, Cuff Size: Normal)   Pulse 69   Temp 97.8 F (36.6 C) (Oral)   Ht 5\' 5"  (1.651 m)   Wt 155 lb 6.4 oz (70.5 kg)   SpO2 98%   BMI 25.86 kg/m   Wt Readings from Last 3 Encounters:  05/24/23 155 lb 6.4 oz (70.5 kg)  04/28/23 154 lb  3.2 oz (69.9 kg)  04/14/23 151 lb 12.8 oz (68.9 kg)    Physical Exam  Results for orders placed or performed in visit on 04/28/23  Basic metabolic panel   Collection Time: 04/28/23 10:40 AM  Result Value Ref Range   Glucose 107 (H) 70 - 99 mg/dL   BUN 13 6 - 24 mg/dL   Creatinine, Ser 1.61 0.57 - 1.00 mg/dL   eGFR 096 >04 VW/UJW/1.19   BUN/Creatinine Ratio 20 9 - 23   Sodium 139 134 - 144 mmol/L   Potassium 3.7 3.5 - 5.2 mmol/L   Chloride 99 96 - 106 mmol/L   CO2 23 20 - 29 mmol/L   Calcium 8.8 8.7 - 10.2 mg/dL      Assessment & Plan:   Problem List Items Addressed This Visit   None    Follow up plan: No follow-ups on file.

## 2023-05-25 ENCOUNTER — Encounter: Payer: Self-pay | Admitting: Family Medicine

## 2023-05-25 NOTE — Assessment & Plan Note (Signed)
Under good control on current regimen. Continue current regimen. Continue to monitor. Call with any concerns. Refills given. Labs checked last visit and normal.

## 2023-05-25 NOTE — Assessment & Plan Note (Signed)
Under good control on current regimen. Continue current regimen. Continue to monitor. Call with any concerns. Refills up to date. Call if she needs refills before next visit.

## 2023-07-29 ENCOUNTER — Other Ambulatory Visit: Payer: Self-pay | Admitting: Nurse Practitioner

## 2023-07-29 ENCOUNTER — Other Ambulatory Visit: Payer: Self-pay | Admitting: Family Medicine

## 2023-07-29 NOTE — Telephone Encounter (Signed)
 Requested Prescriptions  Pending Prescriptions Disp Refills   DULoxetine  (CYMBALTA ) 60 MG capsule [Pharmacy Med Name: DULOXETINE  HCL 60 MG CAP] 90 capsule 0    Sig: TAKE 1 CAPSULE BY MOUTH ONCE DAILY     Psychiatry: Antidepressants - SNRI - duloxetine  Passed - 07/29/2023  3:11 PM      Passed - Cr in normal range and within 360 days    Creatinine  Date Value Ref Range Status  08/14/2013 0.67 0.60 - 1.30 mg/dL Final   Creatinine, Ser  Date Value Ref Range Status  04/28/2023 0.65 0.57 - 1.00 mg/dL Final         Passed - eGFR is 30 or above and within 360 days    EGFR (African American)  Date Value Ref Range Status  08/14/2013 >60  Final   GFR calc Af Amer  Date Value Ref Range Status  06/01/2019 86 >59 mL/min/1.73 Final   EGFR (Non-African Amer.)  Date Value Ref Range Status  08/14/2013 >60  Final    Comment:    eGFR values <77mL/min/1.73 m2 may be an indication of chronic kidney disease (CKD). Calculated eGFR is useful in patients with stable renal function. The eGFR calculation will not be reliable in acutely ill patients when serum creatinine is changing rapidly. It is not useful in  patients on dialysis. The eGFR calculation may not be applicable to patients at the low and high extremes of body sizes, pregnant women, and vegetarians.    GFR, Estimated  Date Value Ref Range Status  04/08/2023 54 (L) >60 mL/min Final    Comment:    (NOTE) Calculated using the CKD-EPI Creatinine Equation (2021)    eGFR  Date Value Ref Range Status  04/28/2023 103 >59 mL/min/1.73 Final         Passed - Completed PHQ-2 or PHQ-9 in the last 360 days      Passed - Last BP in normal range    BP Readings from Last 1 Encounters:  05/24/23 136/89         Passed - Valid encounter within last 6 months    Recent Outpatient Visits           2 months ago Essential hypertension   [place] [facility], Jerilee Montane, DO       Future Appointments             In 3  weeks Solomon Dupre, DO [place] [facility]

## 2023-07-29 NOTE — Telephone Encounter (Signed)
 Requested Prescriptions  Pending Prescriptions Disp Refills   rosuvastatin  (CRESTOR ) 10 MG tablet [Pharmacy Med Name: ROSUVASTATIN  CALCIUM  10 MG TAB] 90 tablet 1    Sig: TAKE 1 TABLET BY MOUTH ONCE DAILY     Cardiovascular:  Antilipid - Statins 2 Failed - 07/29/2023  3:16 PM      Failed - Lipid Panel in normal range within the last 12 months    Cholesterol, Total  Date Value Ref Range Status  01/24/2023 179 100 - 199 mg/dL Final   LDL Chol Calc (NIH)  Date Value Ref Range Status  01/24/2023 50 0 - 99 mg/dL Final   HDL  Date Value Ref Range Status  01/24/2023 51 >39 mg/dL Final   Triglycerides  Date Value Ref Range Status  01/24/2023 539 (H) 0 - 149 mg/dL Final         Passed - Cr in normal range and within 360 days    Creatinine  Date Value Ref Range Status  08/14/2013 0.67 0.60 - 1.30 mg/dL Final   Creatinine, Ser  Date Value Ref Range Status  04/28/2023 0.65 0.57 - 1.00 mg/dL Final         Passed - Patient is not pregnant      Passed - Valid encounter within last 12 months    Recent Outpatient Visits           2 months ago Essential hypertension   [place] [facility] [place], Jerilee Montane, DO       Future Appointments             In 3 weeks Solomon Dupre, DO [place] [facility], [facility]

## 2023-08-17 ENCOUNTER — Ambulatory Visit: Payer: Self-pay

## 2023-08-17 NOTE — Telephone Encounter (Signed)
 Chief Complaint: hypertension Symptoms: hypertension and headache Frequency: since monday Pertinent Negatives: Patient denies chest pain Disposition: [] ED /[] Urgent Care (no appt availability in office) / [] Appointment(In office/virtual)/ []  [facility]/ [] Home Care/ [] Refused Recommended Disposition /[] [facility]/ [x]  Follow-up with PCP  Additional Notes: pt states Bp was 156/101 about 10 minutes ago. States yesterday it was 176/114 but she went and laid down and it came down. States it has been elevated since Monday. States she is also experiencing a headache. States she has not taken her medication this morning. Pt has appt for 08/23/23 already scheduled. Denied to see another provider. Instructed pt to go ahead and take her medication and continue to monitor BP and when to call back.   Copied from CRM [id number]. Topic: Clinical - Medical Advice >> Aug 17, 2023  9:18 AM Rosaria Common wrote: Reason for CRM: Hypertension 156/101 today 9:15AM since Monday 5/12. Headaches. Reason for Disposition  Systolic BP  >= 160 OR Diastolic >= 100  Answer Assessment - Initial Assessment Questions 1. BLOOD PRESSURE: "What is the blood pressure?" "Did you take at least two measurements 5 minutes apart?"     yes 2. ONSET: "When did you take your blood pressure?"     about 3. HOW: "How did you take your blood pressure?" (e.g., automatic home BP monitor, visiting nurse)     Home cuff 4. HISTORY: "Do you have a history of high blood pressure?"     yes 5. MEDICINES: "Are you taking any medicines for blood pressure?" "Have you missed any doses recently?"     yes 6. OTHER SYMPTOMS: "Do you have any symptoms?" (e.g., blurred vision, chest pain, difficulty breathing, headache, weakness)     headache  Protocols used: Blood Pressure - High-A-AH

## 2023-08-23 ENCOUNTER — Encounter: Payer: Self-pay | Admitting: Family Medicine

## 2023-08-23 ENCOUNTER — Ambulatory Visit (INDEPENDENT_AMBULATORY_CARE_PROVIDER_SITE_OTHER): Payer: 59 | Admitting: Family Medicine

## 2023-08-23 VITALS — BP 123/81 | HR 85 | Ht 65.0 in | Wt 155.0 lb

## 2023-08-23 DIAGNOSIS — I10 Essential (primary) hypertension: Secondary | ICD-10-CM

## 2023-08-23 DIAGNOSIS — F339 Major depressive disorder, recurrent, unspecified: Secondary | ICD-10-CM

## 2023-08-23 DIAGNOSIS — E782 Mixed hyperlipidemia: Secondary | ICD-10-CM | POA: Diagnosis not present

## 2023-08-23 DIAGNOSIS — J0100 Acute maxillary sinusitis, unspecified: Secondary | ICD-10-CM

## 2023-08-23 DIAGNOSIS — F419 Anxiety disorder, unspecified: Secondary | ICD-10-CM

## 2023-08-23 MED ORDER — LISINOPRIL 20 MG PO TABS: 20.0000 mg | ORAL_TABLET | Freq: Every day | ORAL | 1 refills | Status: DC

## 2023-08-23 MED ORDER — DULOXETINE HCL 60 MG PO CPEP: 60.0000 mg | ORAL_CAPSULE | Freq: Every day | ORAL | 1 refills | Status: DC

## 2023-08-23 MED ORDER — AMOXICILLIN-POT CLAVULANATE 875-125 MG PO TABS: 1.0000 | ORAL_TABLET | Freq: Two times a day (BID) | ORAL | 0 refills | Status: DC

## 2023-08-23 MED ORDER — ROSUVASTATIN CALCIUM 10 MG PO TABS: 10.0000 mg | ORAL_TABLET | Freq: Every day | ORAL | 1 refills | Status: DC

## 2023-08-23 MED ORDER — METOPROLOL SUCCINATE ER 100 MG PO TB24: ORAL_TABLET | ORAL | 1 refills | Status: DC

## 2023-08-23 NOTE — Progress Notes (Signed)
 BP 123/81 (BP Location: Left Arm, Patient Position: Sitting, Cuff Size: Normal)   Pulse 85   Ht 5\' 5"  (1.651 m)   Wt 155 lb (70.3 kg)   SpO2 97%   BMI 25.79 kg/m    Subjective:    Patient ID: Morgan Cisneros, female    DOB: [birth year], 58 y.o.   MRN: [id number]  HPI: Morgan Cisneros is a 58 y.o. female  Chief Complaint  Patient presents with   Hypertension   HYPERTENSION / HYPERLIPIDEMIA Satisfied with current treatment? yes Duration of hypertension: chronic BP monitoring frequency: a few times a month BP medication side effects: no Past BP meds: lisinopril , metoprolol  Duration of hyperlipidemia: chronic Cholesterol medication side effects: no Cholesterol supplements: none Past cholesterol medications: crestor  Medication compliance: excellent compliance Aspirin: no Recent stressors: no Recurrent headaches: no Visual changes: no Palpitations: no Dyspnea: no Chest pain: no Lower extremity edema: no Dizzy/lightheaded: no  UPPER RESPIRATORY TRACT INFECTION Duration: about a month Worst symptom: headaches and cogestion Fever: no Cough: yes Shortness of breath: no Wheezing: no Chest pain: no Chest tightness: no Chest congestion: no Nasal congestion: yes Runny nose: no Post nasal drip: yes Sneezing: no Sore throat: yes Swollen glands: no Sinus pressure: yes Headache: yes Face pain: yes Toothache: yes Ear pain: no  Ear pressure: no  Eyes red/itching:no Eye drainage/crusting: no  Vomiting: no Rash: no Fatigue: yes Sick contacts: no Strep contacts: no  Context: worse Recurrent sinusitis: no Relief with OTC cold/cough medications: no  Treatments attempted: mucinex and anti-histamine    Relevant past medical, surgical, family and social history reviewed and updated as indicated. Interim medical history since our last visit reviewed. Allergies and medications reviewed and updated.  Review of Systems  Constitutional: Negative.   Respiratory:  Negative.    Cardiovascular: Negative.   Gastrointestinal: Negative.   Musculoskeletal: Negative.   Neurological: Negative.   Psychiatric/Behavioral: Negative.      Per HPI unless specifically indicated above     Objective:     BP 123/81 (BP Location: Left Arm, Patient Position: Sitting, Cuff Size: Normal)   Pulse 85   Ht 5\' 5"  (1.651 m)   Wt 155 lb (70.3 kg)   SpO2 97%   BMI 25.79 kg/m   Wt Readings from Last 3 Encounters:  08/23/23 155 lb (70.3 kg)  05/24/23 155 lb 6.4 oz (70.5 kg)  04/28/23 154 lb 3.2 oz (69.9 kg)    Physical Exam Vitals and nursing note reviewed.  Constitutional:      General: She is not in acute distress.    Appearance: Normal appearance. She is not ill-appearing, toxic-appearing or diaphoretic.  HENT:     Head: Normocephalic and atraumatic.     Right Ear: External ear normal.     Left Ear: External ear normal.     Nose: Nose normal.     Mouth/Throat:     Mouth: Mucous membranes are moist.     Pharynx: Oropharynx is clear.  Eyes:     General: No scleral icterus.       Right eye: No discharge.        Left eye: No discharge.     Extraocular Movements: Extraocular movements intact.     Conjunctiva/sclera: Conjunctivae normal.     Pupils: Pupils are equal, round, and reactive to light.  Cardiovascular:     Rate and Rhythm: Normal rate and regular rhythm.     Pulses: Normal pulses.     Heart sounds: Normal heart  sounds. No murmur heard.    No friction rub. No gallop.  Pulmonary:     Effort: Pulmonary effort is normal. No respiratory distress.     Breath sounds: Normal breath sounds. No stridor. No wheezing, rhonchi or rales.  Chest:     Chest wall: No tenderness.  Musculoskeletal:        General: Normal range of motion.     Cervical back: Normal range of motion and neck supple.  Skin:    General: Skin is warm and dry.     Capillary Refill: Capillary refill takes less than 2 seconds.     Coloration: Skin is not jaundiced or pale.      Findings: No bruising, erythema, lesion or rash.  Neurological:     General: No focal deficit present.     Mental Status: She is alert and oriented to person, place, and time. Mental status is at baseline.  Psychiatric:        Mood and Affect: Mood normal.        Behavior: Behavior normal.        Thought Content: Thought content normal.        Judgment: Judgment normal.     Results for orders placed or performed in visit on 04/28/23  Basic metabolic panel   Collection Time: 04/28/23 10:40 AM  Result Value Ref Range   Glucose 107 (H) 70 - 99 mg/dL   BUN 13 6 - 24 mg/dL   Creatinine, Ser 9.14 0.57 - 1.00 mg/dL   eGFR 782 >95 AO/ZHY/8.65   BUN/Creatinine Ratio 20 9 - 23   Sodium 139 134 - 144 mmol/L   Potassium 3.7 3.5 - 5.2 mmol/L   Chloride 99 96 - 106 mmol/L   CO2 23 20 - 29 mmol/L   Calcium  8.8 8.7 - 10.2 mg/dL      Assessment & Plan:   Problem List Items Addressed This Visit       Cardiovascular and Mediastinum   Essential hypertension - Primary   Under good control on current regimen. Continue current regimen. Continue to monitor. Call with any concerns. Refills given. Labs drawn today.        Relevant Medications   lisinopril  (ZESTRIL ) 20 MG tablet   metoprolol  succinate (TOPROL -XL) 100 MG 24 hr tablet   rosuvastatin  (CRESTOR ) 10 MG tablet   Other Relevant Orders   CBC with Differential/Platelet   Comprehensive metabolic panel with GFR     Other   Anxiety   Doing well. Not needing lorazepam . Continue current regimen. Continue to monitor. Call with any concerns.       Relevant Medications   DULoxetine  (CYMBALTA ) 60 MG capsule   Other Relevant Orders   CBC with Differential/Platelet   Depression, recurrent (HCC)   Doing well. Not needing lorazepam . Continue current regimen. Continue to monitor. Call with any concerns.       Relevant Medications   DULoxetine  (CYMBALTA ) 60 MG capsule   Other Relevant Orders   CBC with Differential/Platelet    Hyperlipidemia   Under good control on current regimen. Continue current regimen. Continue to monitor. Call with any concerns. Refills given. Labs drawn today.        Relevant Medications   lisinopril  (ZESTRIL ) 20 MG tablet   metoprolol  succinate (TOPROL -XL) 100 MG 24 hr tablet   rosuvastatin  (CRESTOR ) 10 MG tablet   Other Relevant Orders   CBC with Differential/Platelet   Comprehensive metabolic panel with GFR   Lipid Panel w/o Chol/HDL Ratio  Other Visit Diagnoses       Acute non-recurrent maxillary sinusitis       Will treat with augmentin . Call with any concerns. Continue to monitor.   Relevant Medications   amoxicillin -clavulanate (AUGMENTIN ) 875-125 MG tablet   Other Relevant Orders   CBC with Differential/Platelet        Follow up plan: Return in about 6 months (around 02/23/2024) for physical.

## 2023-08-23 NOTE — Assessment & Plan Note (Signed)
 Under good control on current regimen. Continue current regimen. Continue to monitor. Call with any concerns. Refills given. Labs drawn today.

## 2023-08-23 NOTE — Assessment & Plan Note (Signed)
 Doing well. Not needing lorazepam . Continue current regimen. Continue to monitor. Call with any concerns.

## 2023-08-24 LAB — COMPREHENSIVE METABOLIC PANEL WITH GFR
ALT: 39 IU/L — ABNORMAL HIGH (ref 0–32)
AST: 36 IU/L (ref 0–40)
Albumin: 4.4 g/dL (ref 3.8–4.9)
Alkaline Phosphatase: 171 IU/L — ABNORMAL HIGH (ref 44–121)
BUN/Creatinine Ratio: 22 (ref 9–23)
BUN: 16 mg/dL (ref 6–24)
Bilirubin Total: 0.2 mg/dL (ref 0.0–1.2)
CO2: 19 mmol/L — ABNORMAL LOW (ref 20–29)
Calcium: 9.3 mg/dL (ref 8.7–10.2)
Chloride: 104 mmol/L (ref 96–106)
Creatinine, Ser: 0.74 mg/dL (ref 0.57–1.00)
Globulin, Total: 2.3 g/dL (ref 1.5–4.5)
Glucose: 106 mg/dL — ABNORMAL HIGH (ref 70–99)
Potassium: 4.4 mmol/L (ref 3.5–5.2)
Sodium: 141 mmol/L (ref 134–144)
Total Protein: 6.7 g/dL (ref 6.0–8.5)
eGFR: 95 mL/min/{1.73_m2} (ref >59)

## 2023-08-24 LAB — LIPID PANEL W/O CHOL/HDL RATIO
Cholesterol, Total: 177 mg/dL (ref 100–199)
HDL: 48 mg/dL (ref >39)
LDL Chol Calc (NIH): 52 mg/dL (ref 0–99)
Triglycerides: 525 mg/dL — ABNORMAL HIGH (ref 0–149)
VLDL Cholesterol Cal: 77 mg/dL — ABNORMAL HIGH (ref 5–40)

## 2023-08-24 LAB — CBC WITH DIFFERENTIAL/PLATELET
Basophils Absolute: 0 10*3/uL (ref 0.0–0.2)
Basos: 0 %
EOS (ABSOLUTE): 0.2 10*3/uL (ref 0.0–0.4)
Eos: 3 %
Hematocrit: 41.4 % (ref 34.0–46.6)
Hemoglobin: 14 g/dL (ref 11.1–15.9)
Immature Grans (Abs): 0 10*3/uL (ref 0.0–0.1)
Immature Granulocytes: 0 %
Lymphocytes Absolute: 3 10*3/uL (ref 0.7–3.1)
Lymphs: 37 %
MCH: 33.7 pg — ABNORMAL HIGH (ref 26.6–33.0)
MCHC: 33.8 g/dL (ref 31.5–35.7)
MCV: 100 fL — ABNORMAL HIGH (ref 79–97)
Monocytes Absolute: 0.6 10*3/uL (ref 0.1–0.9)
Monocytes: 8 %
Neutrophils Absolute: 4.1 10*3/uL (ref 1.4–7.0)
Neutrophils: 52 %
Platelets: 288 10*3/uL (ref 150–450)
RBC: 4.16 x10E6/uL (ref 3.77–5.28)
RDW: 13.6 % (ref 11.7–15.4)
WBC: 8 10*3/uL (ref 3.4–10.8)

## 2023-08-26 ENCOUNTER — Ambulatory Visit: Payer: Self-pay | Admitting: Family Medicine

## 2023-09-21 ENCOUNTER — Other Ambulatory Visit: Payer: Self-pay | Admitting: Family Medicine

## 2023-09-23 NOTE — Telephone Encounter (Signed)
 Requested Prescriptions  Pending Prescriptions Disp Refills   albuterol  (VENTOLIN  HFA) 108 (90 Base) MCG/ACT inhaler [Pharmacy Med Name: ALBUTEROL  SULFATE HFA 108 (90 BASE)] 8.5 g 2    Sig: INHALE 2 PUFFS INTO THE LUNGS EVERY 6 HOURS AS NEEDED FOR WHEEZING OR SHORTNESS OF BREATH     Pulmonology:  Beta Agonists 2 Passed - 09/23/2023  2:21 PM      Passed - Last BP in normal range    BP Readings from Last 1 Encounters:  08/23/23 123/81         Passed - Last Heart Rate in normal range    Pulse Readings from Last 1 Encounters:  08/23/23 85         Passed - Valid encounter within last 12 months    Recent Outpatient Visits           1 month ago Essential hypertension   [facility] [place], [place], DO   4 months ago Essential hypertension   [place] [facility] [place], Megan P, DO               LORazepam  (ATIVAN ) 0.5 MG tablet [Pharmacy Med Name: LORAZEPAM  0.5 MG TAB] 45 tablet     Sig: TAKE 1 TABLET BY MOUTH TWICE DAILY AS NEEDED ANXIETY     Not Delegated - Psychiatry: Anxiolytics/Hypnotics 2 Failed - 09/23/2023  2:21 PM      Failed - This refill cannot be delegated      Failed - Urine Drug Screen completed in last 360 days      Passed - Patient is not pregnant      Passed - Valid encounter within last 6 months    Recent Outpatient Visits           1 month ago Essential hypertension   [facility] [place], Megan P, DO   4 months ago Essential hypertension   [place] [facility] [place], [place], DO

## 2023-09-23 NOTE — Telephone Encounter (Signed)
 Requested medications are due for refill today.  yes  Requested medications are on the active medications list.  yes  Last refill. 02/21/2023 #45 2 rf  Future visit scheduled.   yes  Notes to clinic.  Refill not delegated.    Requested Prescriptions  Pending Prescriptions Disp Refills   LORazepam  (ATIVAN ) 0.5 MG tablet [Pharmacy Med Name: LORAZEPAM  0.5 MG TAB] 45 tablet     Sig: TAKE 1 TABLET BY MOUTH TWICE DAILY AS NEEDED ANXIETY     Not Delegated - Psychiatry: Anxiolytics/Hypnotics 2 Failed - 09/23/2023  2:21 PM      Failed - This refill cannot be delegated      Failed - Urine Drug Screen completed in last 360 days      Passed - Patient is not pregnant      Passed - Valid encounter within last 6 months    Recent Outpatient Visits           1 month ago Essential hypertension   [facility] [place], Megan P, DO   4 months ago Essential hypertension   [facility] [place], Susan Moore, DO              Signed Prescriptions Disp Refills   albuterol  (VENTOLIN  HFA) 108 (90 Base) MCG/ACT inhaler 8.5 g 2    Sig: INHALE 2 PUFFS INTO THE LUNGS EVERY 6 HOURS AS NEEDED FOR WHEEZING OR SHORTNESS OF BREATH     Pulmonology:  Beta Agonists 2 Passed - 09/23/2023  2:21 PM      Passed - Last BP in normal range    BP Readings from Last 1 Encounters:  08/23/23 123/81         Passed - Last Heart Rate in normal range    Pulse Readings from Last 1 Encounters:  08/23/23 85         Passed - Valid encounter within last 12 months    Recent Outpatient Visits           1 month ago Essential hypertension   [place] [facility] [place], [place], DO   4 months ago Essential hypertension   [place] [facility] [place], [place], DO

## 2023-10-19 ENCOUNTER — Telehealth: Admitting: Physician Assistant

## 2023-10-19 DIAGNOSIS — J208 Acute bronchitis due to other specified organisms: Secondary | ICD-10-CM | POA: Diagnosis not present

## 2023-10-19 DIAGNOSIS — B9689 Other specified bacterial agents as the cause of diseases classified elsewhere: Secondary | ICD-10-CM | POA: Diagnosis not present

## 2023-10-19 MED ORDER — PROMETHAZINE-DM 6.25-15 MG/5ML PO SYRP: 5.0000 mL | ORAL_SOLUTION | Freq: Four times a day (QID) | ORAL | 0 refills | Status: DC | PRN

## 2023-10-19 MED ORDER — AZITHROMYCIN 250 MG PO TABS: ORAL_TABLET | ORAL | 0 refills | Status: AC

## 2023-10-19 MED ORDER — BENZONATATE 100 MG PO CAPS: 100.0000 mg | ORAL_CAPSULE | Freq: Three times a day (TID) | ORAL | 0 refills | Status: DC | PRN

## 2023-10-19 NOTE — Patient Instructions (Signed)
 Morgan Cisneros, thank you for joining Delon CHRISTELLA Dickinson, PA-C for today's virtual visit.  While this provider is not your primary care provider (PCP), if your PCP is located in our provider database this encounter information will be shared with them immediately following your visit.   A [facility] MyChart account gives you access to today's visit and all your visits, tests, and labs performed at [facility]  click here if you don't have a [facility] MyChart account or go to [email or web address]  Consent: (Patient) Morgan Cisneros provided verbal consent for this virtual visit at the beginning of the encounter.  Current Medications:  Current Outpatient Medications:    azithromycin  (ZITHROMAX ) 250 MG tablet, Take 2 tablets on day 1, then 1 tablet daily on days 2 through 5, Disp: 6 tablet, Rfl: 0   benzonatate  (TESSALON ) 100 MG capsule, Take 1-2 capsules (100-200 mg total) by mouth 3 (three) times daily as needed., Disp: 30 capsule, Rfl: 0   promethazine -dextromethorphan (PROMETHAZINE -DM) 6.25-15 MG/5ML syrup, Take 5 mLs by mouth 4 (four) times daily as needed., Disp: 118 mL, Rfl: 0   albuterol  (VENTOLIN  HFA) 108 (90 Base) MCG/ACT inhaler, INHALE 2 PUFFS INTO THE LUNGS EVERY 6 HOURS AS NEEDED FOR WHEEZING OR SHORTNESS OF BREATH, Disp: 8.5 g, Rfl: 2   amoxicillin -clavulanate (AUGMENTIN ) 875-125 MG tablet, Take 1 tablet by mouth 2 (two) times daily., Disp: 20 tablet, Rfl: 0   DULoxetine  (CYMBALTA ) 60 MG capsule, Take 1 capsule (60 mg total) by mouth daily., Disp: 90 capsule, Rfl: 1   lisinopril  (ZESTRIL ) 20 MG tablet, Take 1 tablet (20 mg total) by mouth daily., Disp: 90 tablet, Rfl: 1   LORazepam  (ATIVAN ) 0.5 MG tablet, TAKE 1 TABLET BY MOUTH TWICE DAILY AS NEEDED ANXIETY, Disp: 20 tablet, Rfl: 0   metoprolol  succinate (TOPROL -XL) 100 MG 24 hr tablet, TAKE 1 TABLET(100 MG) BY MOUTH DAILY, Disp: 90 tablet, Rfl: 1   rosuvastatin  (CRESTOR ) 10 MG tablet, Take 1 tablet (10  mg total) by mouth daily., Disp: 90 tablet, Rfl: 1   Medications ordered in this encounter:  Meds ordered this encounter  Medications   azithromycin  (ZITHROMAX ) 250 MG tablet    Sig: Take 2 tablets on day 1, then 1 tablet daily on days 2 through 5    Dispense:  6 tablet    Refill:  0    Supervising Provider:   LAMPTEY, PHILIP O [[id number]]   benzonatate  (TESSALON ) 100 MG capsule    Sig: Take 1-2 capsules (100-200 mg total) by mouth 3 (three) times daily as needed.    Dispense:  30 capsule    Refill:  0    Supervising Provider:   LAMPTEY, PHILIP O [[id number]]   promethazine -dextromethorphan (PROMETHAZINE -DM) 6.25-15 MG/5ML syrup    Sig: Take 5 mLs by mouth 4 (four) times daily as needed.    Dispense:  118 mL    Refill:  0    Supervising Provider:   BLAISE ALEENE KIDD [[id number]]     *If you need refills on other medications prior to your next appointment, please contact your pharmacy*  Follow-Up: Call back or seek an in-person evaluation if the symptoms worsen or if the condition fails to improve as anticipated.  [facility] [phone]  Other Instructions Acute Bronchitis, Adult  Acute bronchitis is sudden inflammation of the main airways (bronchi) that come off the windpipe (trachea) in the lungs. The swelling causes the airways to get smaller and make more mucus than normal. This  can make it hard to breathe and can cause coughing or noisy breathing (wheezing). Acute bronchitis may last several weeks. The cough may last longer. Allergies, asthma, and exposure to smoke may make the condition worse. What are the causes? This condition can be caused by germs and by substances that irritate the lungs, including: Cold and flu viruses. The most common cause of this condition is the virus that causes the common cold. Bacteria. This is less common. Breathing in substances that irritate the lungs, including: Smoke from cigarettes and other forms of tobacco. Dust and  pollen. Fumes from household cleaning products, gases, or burned fuel. Indoor or outdoor air pollution. What increases the risk? The following factors may make you more likely to develop this condition: A weak body's defense system, also called the immune system. A condition that affects your lungs and breathing, such as asthma. What are the signs or symptoms? Common symptoms of this condition include: Coughing. This may bring up clear, yellow, or green mucus from your lungs (sputum). Wheezing. Runny or stuffy nose. Having too much mucus in your lungs (chest congestion). Shortness of breath. Aches and pains, including sore throat or chest. How is this diagnosed? This condition is usually diagnosed based on: Your symptoms and medical history. A physical exam. You may also have other tests, including tests to rule out other conditions, such as pneumonia. These tests include: A test of lung function. Test of a mucus sample to look for the presence of bacteria. Tests to check the oxygen level in your blood. Blood tests. Chest X-ray. How is this treated? Most cases of acute bronchitis clear up over time without treatment. Your health care provider may recommend: Drinking more fluids to help thin your mucus so it is easier to cough up. Taking inhaled medicine (inhaler) to improve air flow in and out of your lungs. Using a vaporizer or a humidifier. These are machines that add water  to the air to help you breathe better. Taking a medicine that thins mucus and clears congestion (expectorant). Taking a medicine that prevents or stops coughing (cough suppressant). It is not common to take an antibiotic medicine for this condition. Follow these instructions at home:  Take over-the-counter and prescription medicines only as told by your health care provider. Use an inhaler, vaporizer, or humidifier as told by your health care provider. Take two teaspoons (10 mL) of honey at bedtime to lessen  coughing at night. Drink enough fluid to keep your urine pale yellow. Do not use any products that contain nicotine or tobacco. These products include cigarettes, chewing tobacco, and vaping devices, such as e-cigarettes. If you need help quitting, ask your health care provider. Get plenty of rest. Return to your normal activities as told by your health care provider. Ask your health care provider what activities are safe for you. Keep all follow-up visits. This is important. How is this prevented? To lower your risk of getting this condition again: Wash your hands often with soap and water  for at least 20 seconds. If soap and water  are not available, use hand sanitizer. Avoid contact with people who have cold symptoms. Try not to touch your mouth, nose, or eyes with your hands. Avoid breathing in smoke or chemical fumes. Breathing smoke or chemical fumes will make your condition worse. Get the flu shot every year. Contact a health care provider if: Your symptoms do not improve after 2 weeks. You have trouble coughing up the mucus. Your cough keeps you awake at night.  You have a fever. Get help right away if you: Cough up blood. Feel pain in your chest. Have severe shortness of breath. Faint or keep feeling like you are going to faint. Have a severe headache. Have a fever or chills that get worse. These symptoms may represent a serious problem that is an emergency. Do not wait to see if the symptoms will go away. Get medical help right away. Call your local emergency services (911 in the [place] Do not drive yourself to the hospital. Summary Acute bronchitis is inflammation of the main airways (bronchi) that come off the windpipe (trachea) in the lungs. The swelling causes the airways to get smaller and make more mucus than normal. Drinking more fluids can help thin your mucus so it is easier to cough up. Take over-the-counter and prescription medicines only as told by your health care  provider. Do not use any products that contain nicotine or tobacco. These products include cigarettes, chewing tobacco, and vaping devices, such as e-cigarettes. If you need help quitting, ask your health care provider. Contact a health care provider if your symptoms do not improve after 2 weeks. This information is not intended to replace advice given to you by your health care provider. Make sure you discuss any questions you have with your health care provider. Document Revised: 07/02/2021 Document Reviewed: 07/23/2020 Elsevier Patient Education  2024 [employer]   If you have been instructed to have an in-person evaluation today at a local Urgent Care facility, please use the link below. It will take you to a list of all of our available [facility], including address, phone number and hours of operation. Please do not delay care.  [facility]  If you or a family member do not have a primary care provider, use the link below to schedule a visit and establish care. When you choose a [facility] primary care physician or advanced practice provider, you gain a long-term partner in health. Find a Primary Care Provider  Learn more about [facility]'s in-office and virtual care options: [facility] - Get Care Now

## 2023-10-19 NOTE — Progress Notes (Signed)
 Virtual Visit Consent   Morgan Cisneros, you are scheduled for a virtual visit with a [facility] provider today. Just as with appointments in the office, your consent must be obtained to participate. Your consent will be active for this visit and any virtual visit you may have with one of our providers in the next 365 days. If you have a MyChart account, a copy of this consent can be sent to you electronically.  As this is a virtual visit, video technology does not allow for your provider to perform a traditional examination. This may limit your provider's ability to fully assess your condition. If your provider identifies any concerns that need to be evaluated in person or the need to arrange testing (such as labs, EKG, etc.), we will make arrangements to do so. Although advances in technology are sophisticated, we cannot ensure that it will always work on either your end or our end. If the connection with a video visit is poor, the visit may have to be switched to a telephone visit. With either a video or telephone visit, we are not always able to ensure that we have a secure connection.  By engaging in this virtual visit, you consent to the provision of healthcare and authorize for your insurance to be billed (if applicable) for the services provided during this visit. Depending on your insurance coverage, you may receive a charge related to this service.  I need to obtain your verbal consent now. Are you willing to proceed with your visit today? Morgan Cisneros has provided verbal consent on 10/19/2023 for a virtual visit (video or telephone). Delon CHRISTELLA Dickinson, PA-C  Date: 10/19/2023 4:26 PM   Virtual Visit via Video Note   I, Delon CHRISTELLA Dickinson, connected with  Morgan Cisneros  ([id number], [birth year]) on 10/19/23 at  4:15 PM EDT by a video-enabled telemedicine application and verified that I am speaking with the correct person using two identifiers.  Location: Patient: Virtual  Visit Location Patient: Home Provider: Virtual Visit Location Provider: Home Office   I discussed the limitations of evaluation and management by telemedicine and the availability of in person appointments. The patient expressed understanding and agreed to proceed.    History of Present Illness: Morgan Cisneros is a 58 y.o. who identifies as a female who was assigned female at birth, and is being seen today for cough and congestion.  HPI: Cough This is a new problem. The current episode started 1 to 4 weeks ago. The problem has been gradually worsening. The problem occurs constantly. The cough is Productive of sputum and productive of purulent sputum. Associated symptoms include headaches, nasal congestion, postnasal drip, rhinorrhea and wheezing. Pertinent negatives include no chills, ear congestion, ear pain, fever, myalgias, sore throat or sweats. Associated symptoms comments: Sinus pain last week. The symptoms are aggravated by lying down. Treatments tried: mucinex, coricidin hbp. The treatment provided no relief. Her past medical history is significant for bronchitis, environmental allergies and pneumonia. There is no history of asthma.     Problems:  Patient Active Problem List   Diagnosis Date Noted   Tobacco abuse 02/21/2023   Hyperlipidemia 05/03/2022   Grade II hemorrhoids 11/27/2021   Elevated liver enzymes 06/19/2021   Palpitations 02/12/2021   Dupuytren's contracture 01/04/2019   Essential hypertension 06/29/2018   Depression, recurrent (HCC) 06/29/2018   Benign neoplasm of ascending colon    Anxiety 04/18/2017   Spasm of back muscles 10/28/2016   Diverticulitis 10/19/2016   GERD (  gastroesophageal reflux disease) 04/18/2015   Menopausal symptoms 04/18/2015   Insomnia    Family history of colon cancer in mother 01/15/2015   Family history of ovarian cancer 01/15/2015   Nicotine dependence, cigarettes, uncomplicated 01/15/2015   Lymphadenopathy, axillary 01/15/2015    Calculus of kidney 12/13/2014   Female genuine stress incontinence 12/13/2014    Allergies:  Allergies  Allergen Reactions   Wellbutrin  [Bupropion ] Other (See Comments)    irritiability   Urocit-K [Potassium Citrate] Nausea Only   Medications:  Current Outpatient Medications:    azithromycin  (ZITHROMAX ) 250 MG tablet, Take 2 tablets on day 1, then 1 tablet daily on days 2 through 5, Disp: 6 tablet, Rfl: 0   benzonatate  (TESSALON ) 100 MG capsule, Take 1-2 capsules (100-200 mg total) by mouth 3 (three) times daily as needed., Disp: 30 capsule, Rfl: 0   promethazine -dextromethorphan (PROMETHAZINE -DM) 6.25-15 MG/5ML syrup, Take 5 mLs by mouth 4 (four) times daily as needed., Disp: 118 mL, Rfl: 0   albuterol  (VENTOLIN  HFA) 108 (90 Base) MCG/ACT inhaler, INHALE 2 PUFFS INTO THE LUNGS EVERY 6 HOURS AS NEEDED FOR WHEEZING OR SHORTNESS OF BREATH, Disp: 8.5 g, Rfl: 2   amoxicillin -clavulanate (AUGMENTIN ) 875-125 MG tablet, Take 1 tablet by mouth 2 (two) times daily., Disp: 20 tablet, Rfl: 0   DULoxetine  (CYMBALTA ) 60 MG capsule, Take 1 capsule (60 mg total) by mouth daily., Disp: 90 capsule, Rfl: 1   lisinopril  (ZESTRIL ) 20 MG tablet, Take 1 tablet (20 mg total) by mouth daily., Disp: 90 tablet, Rfl: 1   LORazepam  (ATIVAN ) 0.5 MG tablet, TAKE 1 TABLET BY MOUTH TWICE DAILY AS NEEDED ANXIETY, Disp: 20 tablet, Rfl: 0   metoprolol  succinate (TOPROL -XL) 100 MG 24 hr tablet, TAKE 1 TABLET(100 MG) BY MOUTH DAILY, Disp: 90 tablet, Rfl: 1   rosuvastatin  (CRESTOR ) 10 MG tablet, Take 1 tablet (10 mg total) by mouth daily., Disp: 90 tablet, Rfl: 1  Observations/Objective: Patient is well-developed, well-nourished in no acute distress.  Resting comfortably at home.  Head is normocephalic, atraumatic.  No labored breathing.  Speech is clear and coherent with logical content.  Patient is alert and oriented at baseline.    Assessment and Plan: 1. Acute bacterial bronchitis (Primary) - azithromycin   (ZITHROMAX ) 250 MG tablet; Take 2 tablets on day 1, then 1 tablet daily on days 2 through 5  Dispense: 6 tablet; Refill: 0 - benzonatate  (TESSALON ) 100 MG capsule; Take 1-2 capsules (100-200 mg total) by mouth 3 (three) times daily as needed.  Dispense: 30 capsule; Refill: 0 - promethazine -dextromethorphan (PROMETHAZINE -DM) 6.25-15 MG/5ML syrup; Take 5 mLs by mouth 4 (four) times daily as needed.  Dispense: 118 mL; Refill: 0  - Worsening over a week despite OTC medications - Will treat with Z-pack, Promethazine  DM and tessalon  perles - Can continue Mucinex (PLAIN) - Push fluids.  - Rest.  - Steam and humidifier can help - Seek in person evaluation if worsening or symptoms fail to improve    Follow Up Instructions: I discussed the assessment and treatment plan with the patient. The patient was provided an opportunity to ask questions and all were answered. The patient agreed with the plan and demonstrated an understanding of the instructions.  A copy of instructions were sent to the patient via MyChart unless otherwise noted below.    The patient was advised to call back or seek an in-person evaluation if the symptoms worsen or if the condition fails to improve as anticipated.    Delon CHRISTELLA Dickinson, PA-C

## 2024-01-09 ENCOUNTER — Other Ambulatory Visit: Payer: Self-pay | Admitting: Family Medicine

## 2024-03-05 ENCOUNTER — Ambulatory Visit: Admitting: Family Medicine

## 2024-04-04 ENCOUNTER — Telehealth: Admitting: Physician Assistant

## 2024-04-04 DIAGNOSIS — J329 Chronic sinusitis, unspecified: Secondary | ICD-10-CM

## 2024-04-04 DIAGNOSIS — J40 Bronchitis, not specified as acute or chronic: Secondary | ICD-10-CM

## 2024-04-04 MED ORDER — PROMETHAZINE-DM 6.25-15 MG/5ML PO SYRP: 5.0000 mL | ORAL_SOLUTION | Freq: Four times a day (QID) | ORAL | 0 refills | Status: DC | PRN

## 2024-04-04 MED ORDER — AMOXICILLIN-POT CLAVULANATE 875-125 MG PO TABS: 1.0000 | ORAL_TABLET | Freq: Two times a day (BID) | ORAL | 0 refills | Status: DC

## 2024-04-04 MED ORDER — PREDNISONE 20 MG PO TABS: 40.0000 mg | ORAL_TABLET | Freq: Every day | ORAL | 0 refills | Status: DC

## 2024-04-04 MED ORDER — FLUTICASONE PROPIONATE 50 MCG/ACT NA SUSP: 2.0000 | Freq: Every day | NASAL | 0 refills | Status: AC

## 2024-04-04 NOTE — Patient Instructions (Signed)
 " Morgan Cisneros, thank you for joining Delon CHRISTELLA Dickinson, PA-C for today's virtual visit.  While this provider is not your primary care provider (PCP), if your PCP is located in our provider database this encounter information will be shared with them immediately following your visit.   [name] gives you access to today's visit and all your visits, tests, and labs performed at [facility]  click here if you don't have a [facility] MyChart account or go to [email or web address]  Consent: (Patient) Morgan Cisneros provided verbal consent for this virtual visit at the beginning of the encounter.  Current Medications:  Current Outpatient Medications:    amoxicillin -clavulanate (AUGMENTIN ) 875-125 MG tablet, Take 1 tablet by mouth 2 (two) times daily., Disp: 14 tablet, Rfl: 0   fluticasone  (FLONASE ) 50 MCG/ACT nasal spray, Place 2 sprays into both nostrils daily., Disp: 16 g, Rfl: 0   predniSONE  (DELTASONE ) 20 MG tablet, Take 2 tablets (40 mg total) by mouth daily with breakfast., Disp: 10 tablet, Rfl: 0   promethazine -dextromethorphan (PROMETHAZINE -DM) 6.25-15 MG/5ML syrup, Take 5 mLs by mouth 4 (four) times daily as needed., Disp: 118 mL, Rfl: 0   benzonatate  (TESSALON ) 100 MG capsule, Take 1-2 capsules (100-200 mg total) by mouth 3 (three) times daily as needed., Disp: 30 capsule, Rfl: 0   DULoxetine  (CYMBALTA ) 60 MG capsule, Take 1 capsule (60 mg total) by mouth daily., Disp: 90 capsule, Rfl: 1   lisinopril  (ZESTRIL ) 20 MG tablet, Take 1 tablet (20 mg total) by mouth daily., Disp: 90 tablet, Rfl: 1   LORazepam  (ATIVAN ) 0.5 MG tablet, TAKE 1 TABLET BY MOUTH TWICE DAILY AS NEEDED ANXIETY, Disp: 20 tablet, Rfl: 0   metoprolol  succinate (TOPROL -XL) 100 MG 24 hr tablet, TAKE 1 TABLET(100 MG) BY MOUTH DAILY, Disp: 90 tablet, Rfl: 1   rosuvastatin  (CRESTOR ) 10 MG tablet, Take 1 tablet (10 mg total) by mouth daily., Disp: 90 tablet, Rfl: 1   Medications  ordered in this encounter:  Meds ordered this encounter  Medications   amoxicillin -clavulanate (AUGMENTIN ) 875-125 MG tablet    Sig: Take 1 tablet by mouth 2 (two) times daily.    Dispense:  14 tablet    Refill:  0    Supervising Provider:   LAMPTEY, PHILIP O [[id number]]   promethazine -dextromethorphan (PROMETHAZINE -DM) 6.25-15 MG/5ML syrup    Sig: Take 5 mLs by mouth 4 (four) times daily as needed.    Dispense:  118 mL    Refill:  0    Supervising Provider:   LAMPTEY, PHILIP O [id number]   fluticasone  (FLONASE ) 50 MCG/ACT nasal spray    Sig: Place 2 sprays into both nostrils daily.    Dispense:  16 g    Refill:  0    Supervising Provider:   BLAISE ALEENE KIDD [[id number]]   predniSONE  (DELTASONE ) 20 MG tablet    Sig: Take 2 tablets (40 mg total) by mouth daily with breakfast.    Dispense:  10 tablet    Refill:  0    Supervising Provider:   BLAISE ALEENE KIDD [[id number]]     *If you need refills on other medications prior to your next appointment, please contact your pharmacy*  Follow-Up: Call back or seek an in-person evaluation if the symptoms worsen or if the condition fails to improve as anticipated.  [name] [phone]  Other Instructions  Acute Bronchitis, Adult  Acute bronchitis is sudden inflammation of the main airways (bronchi) that come off the  windpipe (trachea) in the lungs. The swelling causes the airways to get smaller and make more mucus than normal. This can make it hard to breathe and can cause coughing or noisy breathing (wheezing). Acute bronchitis may last several weeks. The cough may last longer. Allergies, asthma, and exposure to smoke may make the condition worse. What are the causes? This condition can be caused by germs and by substances that irritate the lungs, including: Cold and flu viruses. The most common cause of this condition is the virus that causes the common cold. Bacteria. This is less common. Breathing in substances that  irritate the lungs, including: Smoke from cigarettes and other forms of tobacco. Dust and pollen. Fumes from household cleaning products, gases, or burned fuel. Indoor or outdoor air pollution. What increases the risk? The following factors may make you more likely to develop this condition: A weak body's defense system, also called the immune system. A condition that affects your lungs and breathing, such as asthma. What are the signs or symptoms? Common symptoms of this condition include: Coughing. This may bring up clear, yellow, or green mucus from your lungs (sputum). Wheezing. Runny or stuffy nose. Having too much mucus in your lungs (chest congestion). Shortness of breath. Aches and pains, including sore throat or chest. How is this diagnosed? This condition is usually diagnosed based on: Your symptoms and medical history. A physical exam. You may also have other tests, including tests to rule out other conditions, such as pneumonia. These tests include: A test of lung function. Test of a mucus sample to look for the presence of bacteria. Tests to check the oxygen level in your blood. Blood tests. Chest X-ray. How is this treated? Most cases of acute bronchitis clear up over time without treatment. Your health care provider may recommend: Drinking more fluids to help thin your mucus so it is easier to cough up. Taking inhaled medicine (inhaler) to improve air flow in and out of your lungs. Using a vaporizer or a humidifier. These are machines that add water  to the air to help you breathe better. Taking a medicine that thins mucus and clears congestion (expectorant). Taking a medicine that prevents or stops coughing (cough suppressant). It is not common to take an antibiotic medicine for this condition. Follow these instructions at home:  Take over-the-counter and prescription medicines only as told by your health care provider. Use an inhaler, vaporizer, or humidifier as  told by your health care provider. Take two teaspoons (10 mL) of honey at bedtime to lessen coughing at night. Drink enough fluid to keep your urine pale yellow. Do not use any products that contain nicotine or tobacco. These products include cigarettes, chewing tobacco, and vaping devices, such as e-cigarettes. If you need help quitting, ask your health care provider. Get plenty of rest. Return to your normal activities as told by your health care provider. Ask your health care provider what activities are safe for you. Keep all follow-up visits. This is important. How is this prevented? To lower your risk of getting this condition again: Wash your hands often with soap and water  for at least 20 seconds. If soap and water  are not available, use hand sanitizer. Avoid contact with people who have cold symptoms. Try not to touch your mouth, nose, or eyes with your hands. Avoid breathing in smoke or chemical fumes. Breathing smoke or chemical fumes will make your condition worse. Get the flu shot every year. Contact a health care provider if: Your symptoms  do not improve after 2 weeks. You have trouble coughing up the mucus. Your cough keeps you awake at night. You have a fever. Get help right away if you: Cough up blood. Feel pain in your chest. Have severe shortness of breath. Faint or keep feeling like you are going to faint. Have a severe headache. Have a fever or chills that get worse. These symptoms may represent a serious problem that is an emergency. Do not wait to see if the symptoms will go away. Get medical help right away. Call your local emergency services (911 in the [place] Do not drive yourself to the hospital. Summary Acute bronchitis is inflammation of the main airways (bronchi) that come off the windpipe (trachea) in the lungs. The swelling causes the airways to get smaller and make more mucus than normal. Drinking more fluids can help thin your mucus so it is easier to  cough up. Take over-the-counter and prescription medicines only as told by your health care provider. Do not use any products that contain nicotine or tobacco. These products include cigarettes, chewing tobacco, and vaping devices, such as e-cigarettes. If you need help quitting, ask your health care provider. Contact a health care provider if your symptoms do not improve after 2 weeks. This information is not intended to replace advice given to you by your health care provider. Make sure you discuss any questions you have with your health care provider. Document Revised: 07/02/2021 Document Reviewed: 07/23/2020 Elsevier Patient Education  2024 [employer]   Sinus Infection, Adult A sinus infection, also called sinusitis, is inflammation of your sinuses. Sinuses are hollow spaces in the bones around your face. Your sinuses are located: Around your eyes. In the middle of your forehead. Behind your nose. In your cheekbones. Mucus normally drains out of your sinuses. When your nasal tissues become inflamed or swollen, mucus can become trapped or blocked. This allows bacteria, viruses, and fungi to grow, which leads to infection. Most infections of the sinuses are caused by a virus. A sinus infection can develop quickly. It can last for up to 4 weeks (acute) or for more than 12 weeks (chronic). A sinus infection often develops after a cold. What are the causes? This condition is caused by anything that creates swelling in the sinuses or stops mucus from draining. This includes: Allergies. Asthma. Infection from bacteria or viruses. Deformities or blockages in your nose or sinuses. Abnormal growths in the nose (nasal polyps). Pollutants, such as chemicals or irritants in the air. Infection from fungi. This is rare. What increases the risk? You are more likely to develop this condition if you: Have a weak body defense system (immune system). Do a lot of swimming or diving. Overuse nasal  sprays. Smoke. What are the signs or symptoms? The main symptoms of this condition are pain and a feeling of pressure around the affected sinuses. Other symptoms include: Stuffy nose or congestion that makes it difficult to breathe through your nose. Thick yellow or greenish drainage from your nose. Tenderness, swelling, and warmth over the affected sinuses. A cough that may get worse at night. Decreased sense of smell and taste. Extra mucus that collects in the throat or the back of the nose (postnasal drip) causing a sore throat or bad breath. Tiredness (fatigue). Fever. How is this diagnosed? This condition is diagnosed based on: Your symptoms. Your medical history. A physical exam. Tests to find out if your condition is acute or chronic. This may include: Checking your nose for  nasal polyps. Viewing your sinuses using a device that has a light (endoscope). Testing for allergies or bacteria. Imaging tests, such as an MRI or CT scan. In rare cases, a bone biopsy may be done to rule out more serious types of fungal sinus disease. How is this treated? Treatment for a sinus infection depends on the cause and whether your condition is chronic or acute. If caused by a virus, your symptoms should go away on their own within 10 days. You may be given medicines to relieve symptoms. They include: Medicines that shrink swollen nasal passages (decongestants). A spray that eases inflammation of the nostrils (topical intranasal corticosteroids). Rinses that help get rid of thick mucus in your nose (nasal saline washes). Medicines that treat allergies (antihistamines). Over-the-counter pain relievers. If caused by bacteria, your health care provider may recommend waiting to see if your symptoms improve. Most bacterial infections will get better without antibiotic medicine. You may be given antibiotics if you have: A severe infection. A weak immune system. If caused by narrow nasal passages or  nasal polyps, surgery may be needed. Follow these instructions at home: Medicines Take, use, or apply over-the-counter and prescription medicines only as told by your health care provider. These may include nasal sprays. If you were prescribed an antibiotic medicine, take it as told by your health care provider. Do not stop taking the antibiotic even if you start to feel better. Hydrate and humidify  Drink enough fluid to keep your urine pale yellow. Staying hydrated will help to thin your mucus. Use a cool mist humidifier to keep the humidity level in your home above 50%. Inhale steam for 10-15 minutes, 3-4 times a day, or as told by your health care provider. You can do this in the bathroom while a hot shower is running. Limit your exposure to cool or dry air. Rest Rest as much as possible. Sleep with your head raised (elevated). Make sure you get enough sleep each night. General instructions  Apply a warm, moist washcloth to your face 3-4 times a day or as told by your health care provider. This will help with discomfort. Use nasal saline washes as often as told by your health care provider. Wash your hands often with soap and water  to reduce your exposure to germs. If soap and water  are not available, use hand sanitizer. Do not smoke. Avoid being around people who are smoking (secondhand smoke). Keep all follow-up visits. This is important. Contact a health care provider if: You have a fever. Your symptoms get worse. Your symptoms do not improve within 10 days. Get help right away if: You have a severe headache. You have persistent vomiting. You have severe pain or swelling around your face or eyes. You have vision problems. You develop confusion. Your neck is stiff. You have trouble breathing. These symptoms may be an emergency. Get help right away. Call 911. Do not wait to see if the symptoms will go away. Do not drive yourself to the hospital. Summary A sinus infection is  soreness and inflammation of your sinuses. Sinuses are hollow spaces in the bones around your face. This condition is caused by nasal tissues that become inflamed or swollen. The swelling traps or blocks the flow of mucus. This allows bacteria, viruses, and fungi to grow, which leads to infection. If you were prescribed an antibiotic medicine, take it as told by your health care provider. Do not stop taking the antibiotic even if you start to feel better. Keep all  follow-up visits. This is important. This information is not intended to replace advice given to you by your health care provider. Make sure you discuss any questions you have with your health care provider. Document Revised: 02/24/2021 Document Reviewed: 02/24/2021 Elsevier Patient Education  2024 Else[employer] Inc.   If you have been instructed to have an in-person evaluation today at a local Urgent Care facility, please use the link below. It will take you to a list of all of our available [facility], including address, phone number and hours of operation. Please do not delay care.  [facility]  If you or a family member do not have a primary care provider, use the link below to schedule a visit and establish care. When you choose a [facility] primary care physician or advanced practice provider, you gain a long-term partner in health. Find a Primary Care Provider  Learn more about [facility]'s in-office and virtual care options:  - Get Care Now "

## 2024-04-04 NOTE — Progress Notes (Signed)
 " Virtual Visit Consent   Morgan Cisneros, you are scheduled for a virtual visit with a [facility] provider today. Just as with appointments in the office, your consent must be obtained to participate. Your consent will be active for this visit and any virtual visit you may have with one of our providers in the next 365 days. If you have a MyChart account, a copy of this consent can be sent to you electronically.  As this is a virtual visit, video technology does not allow for your provider to perform a traditional examination. This may limit your provider's ability to fully assess your condition. If your provider identifies any concerns that need to be evaluated in person or the need to arrange testing (such as labs, EKG, etc.), we will make arrangements to do so. Although advances in technology are sophisticated, we cannot ensure that it will always work on either your end or our end. If the connection with a video visit is poor, the visit may have to be switched to a telephone visit. With either a video or telephone visit, we are not always able to ensure that we have a secure connection.  By engaging in this virtual visit, you consent to the provision of healthcare and authorize for your insurance to be billed (if applicable) for the services provided during this visit. Depending on your insurance coverage, you may receive a charge related to this service.  I need to obtain your verbal consent now. Are you willing to proceed with your visit today? Morgan Cisneros has provided verbal consent on 04/04/2024 for a virtual visit (video or telephone). Morgan CHRISTELLA Dickinson, PA-C  Date: 04/04/2024 11:42 AM   Virtual Visit via Video Note   I, Morgan Cisneros, connected with  Morgan Cisneros  ([id number], 04-30-1965) on 04/04/2024 at 11:30 AM EST by a video-enabled telemedicine application and verified that I am speaking with the correct person using two identifiers.  Location: Patient:  Virtual Visit Location Patient: Home Provider: Virtual Visit Location Provider: Home Office   I discussed the limitations of evaluation and management by telemedicine and the availability of in person appointments. The patient expressed understanding and agreed to proceed.    History of Present Illness: Morgan Cisneros is a 58 y.o. who identifies as a female who was assigned female at birth, and is being seen today for cough and congestion.  HPI: URI  This is a new problem. The current episode started 1 to 4 weeks ago (3 weeks). The problem has been unchanged. There has been no fever. Associated symptoms include chest pain (tightness and pain with cough), congestion, coughing, headaches, rhinorrhea (just started today), sinus pain, a sore throat, swollen glands and wheezing. Pertinent negatives include no diarrhea, ear pain, nausea, plugged ear sensation or vomiting. Associated symptoms comments: Post nasal drainage. Treatments tried: Mucinex. The treatment provided no relief.    Problems:  Patient Active Problem List   Diagnosis Date Noted   Tobacco abuse 02/21/2023   Hyperlipidemia 05/03/2022   Grade II hemorrhoids 11/27/2021   Elevated liver enzymes 06/19/2021   Palpitations 02/12/2021   Dupuytren's contracture 01/04/2019   Essential hypertension 06/29/2018   Depression, recurrent 06/29/2018   Benign neoplasm of ascending colon    Anxiety 04/18/2017   Spasm of back muscles 10/28/2016   Diverticulitis 10/19/2016   GERD (gastroesophageal reflux disease) 04/18/2015   Menopausal symptoms 04/18/2015   Insomnia    Family history of colon cancer in mother 01/15/2015  Family history of ovarian cancer 01/15/2015   Nicotine dependence, cigarettes, uncomplicated 01/15/2015   Lymphadenopathy, axillary 01/15/2015   Calculus of kidney 12/13/2014   Female genuine stress incontinence 12/13/2014    Allergies: Allergies[1] Medications: Current  Medications[2]  Observations/Objective: Patient is well-developed, well-nourished in no acute distress.  Resting comfortably at home.  Head is normocephalic, atraumatic.  No labored breathing.  Speech is clear and coherent with logical content.  Patient is alert and oriented at baseline.    Assessment and Plan: 1. Sinobronchitis (Primary) - amoxicillin -clavulanate (AUGMENTIN ) 875-125 MG tablet; Take 1 tablet by mouth 2 (two) times daily.  Dispense: 14 tablet; Refill: 0 - promethazine -dextromethorphan (PROMETHAZINE -DM) 6.25-15 MG/5ML syrup; Take 5 mLs by mouth 4 (four) times daily as needed.  Dispense: 118 mL; Refill: 0 - fluticasone  (FLONASE ) 50 MCG/ACT nasal spray; Place 2 sprays into both nostrils daily.  Dispense: 16 g; Refill: 0 - predniSONE  (DELTASONE ) 20 MG tablet; Take 2 tablets (40 mg total) by mouth daily with breakfast.  Dispense: 10 tablet; Refill: 0  - Worsening over a week despite OTC medications - Will treat with Augmentin , Prednisone , Promethazine  DM and Flonase  - Add in Albuterol  inhaler (has on hand at home) for wheezing - Push fluids.  - Rest.  - Steam and humidifier can help - Seek in person evaluation if worsening or symptoms fail to improve    Follow Up Instructions: I discussed the assessment and treatment plan with the patient. The patient was provided an opportunity to ask questions and all were answered. The patient agreed with the plan and demonstrated an understanding of the instructions.  A copy of instructions were sent to the patient via MyChart unless otherwise noted below.    The patient was advised to call back or seek an in-person evaluation if the symptoms worsen or if the condition fails to improve as anticipated.    Morgan CHRISTELLA Dickinson, PA-C     [1]  Allergies Allergen Reactions   Wellbutrin  [Bupropion ] Other (See Comments)    irritiability   Urocit-K [Potassium Citrate] Nausea Only  [2]  Current Outpatient Medications:     amoxicillin -clavulanate (AUGMENTIN ) 875-125 MG tablet, Take 1 tablet by mouth 2 (two) times daily., Disp: 14 tablet, Rfl: 0   fluticasone  (FLONASE ) 50 MCG/ACT nasal spray, Place 2 sprays into both nostrils daily., Disp: 16 g, Rfl: 0   predniSONE  (DELTASONE ) 20 MG tablet, Take 2 tablets (40 mg total) by mouth daily with breakfast., Disp: 10 tablet, Rfl: 0   promethazine -dextromethorphan (PROMETHAZINE -DM) 6.25-15 MG/5ML syrup, Take 5 mLs by mouth 4 (four) times daily as needed., Disp: 118 mL, Rfl: 0   benzonatate  (TESSALON ) 100 MG capsule, Take 1-2 capsules (100-200 mg total) by mouth 3 (three) times daily as needed., Disp: 30 capsule, Rfl: 0   DULoxetine  (CYMBALTA ) 60 MG capsule, Take 1 capsule (60 mg total) by mouth daily., Disp: 90 capsule, Rfl: 1   lisinopril  (ZESTRIL ) 20 MG tablet, Take 1 tablet (20 mg total) by mouth daily., Disp: 90 tablet, Rfl: 1   LORazepam  (ATIVAN ) 0.5 MG tablet, TAKE 1 TABLET BY MOUTH TWICE DAILY AS NEEDED ANXIETY, Disp: 20 tablet, Rfl: 0   metoprolol  succinate (TOPROL -XL) 100 MG 24 hr tablet, TAKE 1 TABLET(100 MG) BY MOUTH DAILY, Disp: 90 tablet, Rfl: 1   rosuvastatin  (CRESTOR ) 10 MG tablet, Take 1 tablet (10 mg total) by mouth daily., Disp: 90 tablet, Rfl: 1  "

## 2024-04-09 ENCOUNTER — Other Ambulatory Visit: Payer: Self-pay | Admitting: Family Medicine

## 2024-04-10 ENCOUNTER — Other Ambulatory Visit: Payer: Self-pay | Admitting: Family Medicine

## 2024-04-10 NOTE — Telephone Encounter (Signed)
 Copied from CRM [id number]. Topic: Clinical - Medication Refill >> Apr 10, 2024 11:37 AM Donna BRAVO wrote: Medication:  lisinopril  (ZESTRIL ) 20 MG tablet  pharmacy sent in a request, Patient is out of medication   DULoxetine  (CYMBALTA ) 60 MG capsule  metoprolol  succinate (TOPROL -XL) 100 MG 24 hr tablet  rosuvastatin  (CRESTOR ) 10 MG tablet  Has the patient contacted their pharmacy? Yes Pharmacy stated to call provider  This is the patient's preferred pharmacy:   [facility] - [place], [place] - [address] [place] [address] Phone: [phone] Fax: [phone]   Is this the correct pharmacy for this prescription? Yes If no, delete pharmacy and type the correct one.   Has the prescription been filled recently? Yes  Is the patient out of the medication? Yes  Has the patient been seen for an appointment in the last year OR does the patient have an upcoming appointment? Yes  Can we respond through MyChart? Yes  Agent: Please be advised that Rx refills may take up to 3 business days. We ask that you follow-up with your pharmacy.

## 2024-04-10 NOTE — Telephone Encounter (Signed)
 Requested Prescriptions  Pending Prescriptions Disp Refills   lisinopril  (ZESTRIL ) 20 MG tablet [Pharmacy Med Name: LISINOPRIL  20 MG TAB] 90 tablet 0    Sig: TAKE 1 TABLET BY MOUTH ONCE DAILY     Cardiovascular:  ACE Inhibitors Failed - 04/10/2024  4:25 PM      Failed - Cr in normal range and within 180 days    Creatinine  Date Value Ref Range Status  08/14/2013 0.67 0.60 - 1.30 mg/dL Final   Creatinine, Ser  Date Value Ref Range Status  08/23/2023 0.74 0.57 - 1.00 mg/dL Final         Failed - K in normal range and within 180 days    Potassium  Date Value Ref Range Status  08/23/2023 4.4 3.5 - 5.2 mmol/L Final  08/14/2013 4.0 3.5 - 5.1 mmol/L Final         Failed - Valid encounter within last 6 months    Recent Outpatient Visits           7 months ago Essential hypertension   [place] [facility] [place], Megan P, DO   10 months ago Essential hypertension   [place] [facility] [place], [place], DO              Passed - Patient is not pregnant      Passed - Last BP in normal range    BP Readings from Last 1 Encounters:  08/23/23 123/81

## 2024-04-12 MED ORDER — METOPROLOL SUCCINATE ER 100 MG PO TB24: ORAL_TABLET | ORAL | 0 refills | Status: DC

## 2024-04-12 MED ORDER — ROSUVASTATIN CALCIUM 10 MG PO TABS: 10.0000 mg | ORAL_TABLET | Freq: Every day | ORAL | 0 refills | Status: DC

## 2024-04-12 MED ORDER — DULOXETINE HCL 60 MG PO CPEP: 60.0000 mg | ORAL_CAPSULE | Freq: Every day | ORAL | 0 refills | Status: DC

## 2024-04-12 NOTE — Telephone Encounter (Signed)
 Requested Prescriptions  Pending Prescriptions Disp Refills   lisinopril  (ZESTRIL ) 20 MG tablet 90 tablet 1    Sig: Take 1 tablet (20 mg total) by mouth daily.     Cardiovascular:  ACE Inhibitors Failed - 04/12/2024  9:06 AM      Failed - Cr in normal range and within 180 days    Creatinine  Date Value Ref Range Status  08/14/2013 0.67 0.60 - 1.30 mg/dL Final   Creatinine, Ser  Date Value Ref Range Status  08/23/2023 0.74 0.57 - 1.00 mg/dL Final         Failed - K in normal range and within 180 days    Potassium  Date Value Ref Range Status  08/23/2023 4.4 3.5 - 5.2 mmol/L Final  08/14/2013 4.0 3.5 - 5.1 mmol/L Final         Failed - Valid encounter within last 6 months    Recent Outpatient Visits           7 months ago Essential hypertension   [place] [facility] [place], Megan P, DO   10 months ago Essential hypertension   [place] [facility] [place], [place], DO              Passed - Patient is not pregnant      Passed - Last BP in normal range    BP Readings from Last 1 Encounters:  08/23/23 123/81          DULoxetine  (CYMBALTA ) 60 MG capsule 90 capsule 0    Sig: Take 1 capsule (60 mg total) by mouth daily.     Psychiatry: Antidepressants - SNRI - duloxetine  Failed - 04/12/2024  9:06 AM      Failed - Valid encounter within last 6 months    Recent Outpatient Visits           7 months ago Essential hypertension   [place] [facility] [place], Megan P, DO   10 months ago Essential hypertension   [place] [facility] [place], Megan P, DO              Passed - Cr in normal range and within 360 days    Creatinine  Date Value Ref Range Status  08/14/2013 0.67 0.60 - 1.30 mg/dL Final   Creatinine, Ser  Date Value Ref Range Status  08/23/2023 0.74 0.57 - 1.00 mg/dL Final         Passed - eGFR is 30 or above and within 360 days    EGFR (African American)  Date Value Ref Range Status   08/14/2013 >60  Final   GFR calc Af Amer  Date Value Ref Range Status  06/01/2019 86 >59 mL/min/1.73 Final   EGFR (Non-African Amer.)  Date Value Ref Range Status  08/14/2013 >60  Final    Comment:    eGFR values <37mL/min/1.73 m2 may be an indication of chronic kidney disease (CKD). Calculated eGFR is useful in patients with stable renal function. The eGFR calculation will not be reliable in acutely ill patients when serum creatinine is changing rapidly. It is not useful in  patients on dialysis. The eGFR calculation may not be applicable to patients at the low and high extremes of body sizes, pregnant women, and vegetarians.    GFR, Estimated  Date Value Ref Range Status  04/08/2023 54 (L) >60 mL/min Final    Comment:    (NOTE) Calculated using the CKD-EPI Creatinine Equation (2021)  eGFR  Date Value Ref Range Status  08/23/2023 95 >59 mL/min/1.73 Final         Passed - Completed PHQ-2 or PHQ-9 in the last 360 days      Passed - Last BP in normal range    BP Readings from Last 1 Encounters:  08/23/23 123/81          metoprolol  succinate (TOPROL -XL) 100 MG 24 hr tablet 90 tablet 0    Sig: TAKE 1 TABLET(100 MG) BY MOUTH DAILY     Cardiovascular:  Beta Blockers Failed - 04/12/2024  9:06 AM      Failed - Valid encounter within last 6 months    Recent Outpatient Visits           7 months ago Essential hypertension   [place] [facility] [place], Megan P, DO   10 months ago Essential hypertension   [place], [place] P, DO              Passed - Last BP in normal range    BP Readings from Last 1 Encounters:  08/23/23 123/81         Passed - Last Heart Rate in normal range    Pulse Readings from Last 1 Encounters:  08/23/23 85          rosuvastatin  (CRESTOR ) 10 MG tablet 90 tablet 0    Sig: Take 1 tablet (10 mg total) by mouth daily.     Cardiovascular:  Antilipid - Statins 2 Failed - 04/12/2024  9:06  AM      Failed - Lipid Panel in normal range within the last 12 months    Cholesterol, Total  Date Value Ref Range Status  08/23/2023 177 100 - 199 mg/dL Final   LDL Chol Calc (NIH)  Date Value Ref Range Status  08/23/2023 52 0 - 99 mg/dL Final   HDL  Date Value Ref Range Status  08/23/2023 48 >39 mg/dL Final   Triglycerides  Date Value Ref Range Status  08/23/2023 525 (H) 0 - 149 mg/dL Final         Passed - Cr in normal range and within 360 days    Creatinine  Date Value Ref Range Status  08/14/2013 0.67 0.60 - 1.30 mg/dL Final   Creatinine, Ser  Date Value Ref Range Status  08/23/2023 0.74 0.57 - 1.00 mg/dL Final         Passed - Patient is not pregnant      Passed - Valid encounter within last 12 months    Recent Outpatient Visits           7 months ago Essential hypertension   [place] [facility] [place], Megan P, DO   10 months ago Essential hypertension   [place] [facility] [place], [place], DO

## 2024-04-24 ENCOUNTER — Ambulatory Visit: Admitting: Family Medicine

## 2024-04-24 ENCOUNTER — Encounter: Payer: Self-pay | Admitting: Family Medicine

## 2024-04-24 VITALS — BP 145/90 | HR 78 | Temp 98.0°F | Ht 65.0 in | Wt 155.8 lb

## 2024-04-24 DIAGNOSIS — F339 Major depressive disorder, recurrent, unspecified: Secondary | ICD-10-CM

## 2024-04-24 DIAGNOSIS — R232 Flushing: Secondary | ICD-10-CM | POA: Diagnosis not present

## 2024-04-24 DIAGNOSIS — Z1231 Encounter for screening mammogram for malignant neoplasm of breast: Secondary | ICD-10-CM

## 2024-04-24 DIAGNOSIS — Z23 Encounter for immunization: Secondary | ICD-10-CM | POA: Diagnosis not present

## 2024-04-24 DIAGNOSIS — Z0000 Encounter for general adult medical examination without abnormal findings: Secondary | ICD-10-CM

## 2024-04-24 DIAGNOSIS — I10 Essential (primary) hypertension: Secondary | ICD-10-CM

## 2024-04-24 DIAGNOSIS — E782 Mixed hyperlipidemia: Secondary | ICD-10-CM

## 2024-04-24 LAB — MICROALBUMIN, URINE WAIVED
Creatinine, Urine Waived: 300 mg/dL (ref 10–300)
Microalb, Ur Waived: 150 mg/L — ABNORMAL HIGH (ref 0–19)

## 2024-04-24 MED ORDER — GABAPENTIN 100 MG PO CAPS: 100.0000 mg | ORAL_CAPSULE | Freq: Every day | ORAL | 3 refills | Status: AC

## 2024-04-24 MED ORDER — DULOXETINE HCL 60 MG PO CPEP: 60.0000 mg | ORAL_CAPSULE | Freq: Every day | ORAL | 1 refills | Status: AC

## 2024-04-24 MED ORDER — METOPROLOL SUCCINATE ER 100 MG PO TB24: ORAL_TABLET | ORAL | 1 refills | Status: AC

## 2024-04-24 MED ORDER — ROSUVASTATIN CALCIUM 10 MG PO TABS: 10.0000 mg | ORAL_TABLET | Freq: Every day | ORAL | 1 refills | Status: AC

## 2024-04-24 MED ORDER — LISINOPRIL 30 MG PO TABS: 30.0000 mg | ORAL_TABLET | Freq: Every day | ORAL | 0 refills | Status: AC

## 2024-04-24 NOTE — Patient Instructions (Signed)
 Please call to schedule your mammogram and/or bone density: [facility] at [facility]  Address: [address], [place], [place] [address] Phone: [phone]  [facility] [address] [place],  [place]  [address] Phone: [phone]

## 2024-04-24 NOTE — Progress Notes (Signed)
 "  BP (!) 145/90   Pulse 78   Temp 98 F (36.7 C) (Oral)   Ht 5' 5 (1.651 m)   Wt 155 lb 12.8 oz (70.7 kg)   SpO2 97%   BMI 25.93 kg/m    Subjective:    Patient ID: Morgan Cisneros, female    DOB: 08-Dec-1965, 59 y.o.   MRN: [id number]  HPI: Morgan Cisneros is a 59 y.o. female presenting on 04/24/2024 for comprehensive medical examination. Current medical complaints include:  HYPERTENSION / HYPERLIPIDEMIA Satisfied with current treatment? no Duration of hypertension: chronic BP monitoring frequency: rarely BP medication side effects: no Past BP meds: lisinopril , metoprolol  Duration of hyperlipidemia: chronic Cholesterol medication side effects: no Cholesterol supplements: none Past cholesterol medications: crestor  Medication compliance: excellent compliance Aspirin: no Recent stressors: no Recurrent headaches: no Visual changes: no Palpitations: no Dyspnea: no Chest pain: no Lower extremity edema: no Dizzy/lightheaded: no  ANXIETY/DEPRESSION Duration: chronic Status:stable Anxious mood: no  Excessive worrying: no Irritability: no  Sweating: no Nausea: no Palpitations:no Hyperventilation: no Panic attacks: no Agoraphobia: no  Obscessions/compulsions: no Depressed mood: no    04/24/2024    3:48 PM 08/23/2023    3:13 PM 05/24/2023    4:33 PM 04/28/2023   10:23 AM 04/14/2023   10:37 AM  Depression screen PHQ 2/9  Decreased Interest 1 1 1 1 1   Down, Depressed, Hopeless 1 1 1 1 1   PHQ - 2 Score 2 2 2 2 2   Altered sleeping 1 1 2 3 1   Tired, decreased energy 2 1 2 2 1   Change in appetite 0 0 0 0 0  Feeling bad or failure about yourself  1 0 1 1 1   Trouble concentrating 1 1 2 2 1   Moving slowly or fidgety/restless 0 0 0 0 0  Suicidal thoughts 0 0 0 0 0  PHQ-9 Score 7 5  9  10  6    Difficult doing work/chores Not difficult at all Not difficult at all Not difficult at all Somewhat difficult Not difficult at all     Data saved with a previous flowsheet row  definition      04/24/2024    3:51 PM 08/23/2023    3:14 PM 05/24/2023    4:34 PM 04/28/2023   10:23 AM  GAD 7 : Generalized Anxiety Score  Nervous, Anxious, on Edge 1 1  1  1    Control/stop worrying 1 0  1  1   Worry too much - different things 1 0  1  1   Trouble relaxing 1 1  1  2    Restless 0 1  1  1    Easily annoyed or irritable 0 0  0  0   Afraid - awful might happen 0 0  1  1   Total GAD 7 Score 4 3 6 7   Anxiety Difficulty Not difficult at all Not difficult at all Not difficult at all Somewhat difficult     Data saved with a previous flowsheet row definition   Anhedonia: no Weight changes: no Insomnia: no   Hypersomnia: no Fatigue/loss of energy: no Feelings of worthlessness: no Feelings of guilt: no Impaired concentration/indecisiveness: no Suicidal ideations: no  Crying spells: no Recent Stressors/Life Changes: no   Relationship problems: no   Family stress: no     Financial stress: no    Job stress: no    Recent death/loss: no  Menopausal Symptoms: yes  Depression Screen done today and results listed below:  04/24/2024    3:48 PM 08/23/2023    3:13 PM 05/24/2023    4:33 PM 04/28/2023   10:23 AM 04/14/2023   10:37 AM  Depression screen PHQ 2/9  Decreased Interest 1 1 1 1 1   Down, Depressed, Hopeless 1 1 1 1 1   PHQ - 2 Score 2 2 2 2 2   Altered sleeping 1 1 2 3 1   Tired, decreased energy 2 1 2 2 1   Change in appetite 0 0 0 0 0  Feeling bad or failure about yourself  1 0 1 1 1   Trouble concentrating 1 1 2 2 1   Moving slowly or fidgety/restless 0 0 0 0 0  Suicidal thoughts 0 0 0 0 0  PHQ-9 Score 7 5  9  10  6    Difficult doing work/chores Not difficult at all Not difficult at all Not difficult at all Somewhat difficult Not difficult at all     Data saved with a previous flowsheet row definition     Past Medical History:  Past Medical History:  Diagnosis Date   Abdominal pain    Acute gastritis without hemorrhage    Apocrine cyst 12/23/2016    Appendicitis 02/01/2017   C. difficile colitis 10/2016   Diverticulitis 10/2016   Diverticulitis of large intestine without perforation or abscess without bleeding    Flank pain    Gross hematuria    History of kidney stones    Hypertension    IBS (irritable bowel syndrome)    Inflammation of sacroiliac joint 10/28/2016   Insomnia    LBP (low back pain) 12/13/2014   Ovarian cyst    Renal calculus, right    Renal calculus, right    Thoracic back pain    Right sided    Surgical History:  Past Surgical History:  Procedure Laterality Date   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, PASH   BREAST BIOPSY Right 01/17/2020   no clip due to pt condition during biopsy-neg   BREAST BIOPSY Right 02/07/2020   re-biopsy due to prior biopsy/ coil clip/ benign   COLONOSCOPY WITH PROPOFOL  N/A 04/25/2017   Procedure: COLONOSCOPY WITH PROPOFOL ;  Surgeon: Jinny Carmine, MD;  Location: [facility];  Service: Endoscopy;  Laterality: N/A;   COLONOSCOPY WITH PROPOFOL  N/A 05/28/2022   Procedure: COLONOSCOPY WITH PROPOFOL ;  Surgeon: Jinny Carmine, MD;  Location: [facility];  Service: Endoscopy;  Laterality: N/A;   CYSTOSCOPY  11/26/2010   with stent placement   ESOPHAGOGASTRODUODENOSCOPY (EGD) WITH PROPOFOL  N/A 04/25/2017   Procedure: ESOPHAGOGASTRODUODENOSCOPY (EGD) WITH PROPOFOL ;  Surgeon: Jinny Carmine, MD;  Location: [facility];  Service: Endoscopy;  Laterality: N/A;   HEMORRHOID SURGERY     kidney stent     LAPAROSCOPIC APPENDECTOMY N/A 02/02/2017   Procedure: APPENDECTOMY LAPAROSCOPIC;  Surgeon: Shelva Dunnings, MD;  Location: [facility];  Service: General;  Laterality: N/A;   LAPAROSCOPIC SIGMOID COLECTOMY N/A 11/01/2016   Procedure: LAPAROSCOPIC SIGMOID COLECTOMY;  Surgeon: Shelva Dunnings, MD;  Location: [facility];  Service: General;  Laterality: N/A;   LITHOTRIPSY     X 4   POLYPECTOMY  04/25/2017   Procedure: POLYPECTOMY INTESTINAL;  Surgeon: Jinny Carmine, MD;   Location: [facility];  Service: Endoscopy;;   POLYPECTOMY  05/28/2022   Procedure: POLYPECTOMY;  Surgeon: Jinny Carmine, MD;  Location: [facility];  Service: Endoscopy;;   TONSILLECTOMY     TOTAL ABDOMINAL HYSTERECTOMY  08/2013   TOTAL ABDOMINAL HYSTERECTOMY W/ BILATERAL SALPINGOOPHORECTOMY  TUBAL LIGATION     Ureteroscopy Right 11/21/1997   with holmium laser    Medications:  Current Outpatient Medications on File Prior to Visit  Medication Sig   albuterol  (VENTOLIN  HFA) 108 (90 Base) MCG/ACT inhaler Inhale 2 puffs into the lungs every 6 (six) hours as needed. As needed   fluticasone  (FLONASE ) 50 MCG/ACT nasal spray Place 2 sprays into both nostrils daily. (Patient taking differently: Place 2 sprays into both nostrils daily. As needed)   No current facility-administered medications on file prior to visit.    Allergies:  Allergies[1]  Social History:  Social History   Socioeconomic History   Marital status: Widowed    Spouse name: Not on file   Number of children: Not on file   Years of education: Not on file   Highest education level: 12th grade  Occupational History   Not on file  Tobacco Use   Smoking status: Every Day    Current packs/day: 0.50    Types: Cigarettes   Smokeless tobacco: Never  Vaping Use   Vaping status: Never Used  Substance and Sexual Activity   Alcohol use: Yes    Alcohol/week: 0.0 standard drinks of alcohol    Comment: occasional   Drug use: No   Sexual activity: Not Currently    Birth control/protection: Surgical  Other Topics Concern   Not on file  Social History Narrative   Not on file   Social Drivers of Health   Tobacco Use: High Risk (04/24/2024)   Patient History    Smoking Tobacco Use: Every Day    Smokeless Tobacco Use: Never    Passive Exposure: Not on file  Financial Resource Strain: Low Risk (03/05/2024)   Overall Financial Resource Strain (CARDIA)    Difficulty of Paying Living Expenses: Not hard at  all  Food Insecurity: No Food Insecurity (03/05/2024)   Epic    Worried About Radiation Protection Practitioner of Food in the Last Year: Never true    Ran Out of Food in the Last Year: Never true  Transportation Needs: No Transportation Needs (03/05/2024)   Epic    Lack of Transportation (Medical): No    Lack of Transportation (Non-Medical): No  Physical Activity: Inactive (03/05/2024)   Exercise Vital Sign    Days of Exercise per Week: 0 days    Minutes of Exercise per Session: Not on file  Stress: Stress Concern Present (03/05/2024)   [employer] of Occupational Health - Occupational Stress Questionnaire    Feeling of Stress: Rather much  Social Connections: Socially Isolated (03/05/2024)   Social Connection and Isolation Panel    Frequency of Communication with Friends and Family: More than three times a week    Frequency of Social Gatherings with Friends and Family: Once a week    Attends Religious Services: Never    Database Administrator or Organizations: No    Attends Engineer, Structural: Not on file    Marital Status: Widowed  Intimate Partner Violence: Not At Risk (04/24/2024)   Epic    Fear of Current or Ex-Partner: No    Emotionally Abused: No    Physically Abused: No    Sexually Abused: No  Depression (PHQ2-9): Medium Risk (04/24/2024)   Depression (PHQ2-9)    PHQ-2 Score: 7  Alcohol Screen: Low Risk (03/05/2024)   Alcohol Screen    Last Alcohol Screening Score (AUDIT): 5  Housing: Unknown (03/05/2024)   Epic    Unable to Pay for Housing in the Last  Year: No    Number of Times Moved in the Last Year: Not on file    Homeless in the Last Year: No  Utilities: Not At Risk (04/24/2024)   Epic    Threatened with loss of utilities: No  Health Literacy: Adequate Health Literacy (04/24/2024)   B1300 Health Literacy    Frequency of need for help with medical instructions: Never   Tobacco Use History[2] Social History   Substance and Sexual Activity  Alcohol Use Yes    Alcohol/week: 0.0 standard drinks of alcohol   Comment: occasional    Family History:  Family History  Problem Relation Age of Onset   Kidney Stones Mother    Ovarian cancer Mother    Colon cancer Mother    Lung cancer Father    Hyperlipidemia Sister    Hypertension Sister    Hypertension Sister    Hyperlipidemia Sister    Alzheimer's disease Maternal Grandmother    Cancer Maternal Grandfather        Liver   Alzheimer's disease Paternal Grandfather    Breast cancer Neg Hx     Past medical history, surgical history, medications, allergies, family history and social history reviewed with patient today and changes made to appropriate areas of the chart.   Review of Systems  Constitutional:  Positive for diaphoresis. Negative for chills, fever, malaise/fatigue and weight loss.  HENT: Negative.    Eyes: Negative.   Respiratory: Negative.    Cardiovascular: Negative.   Gastrointestinal:  Positive for heartburn. Negative for abdominal pain, blood in stool, constipation, diarrhea, melena, nausea and vomiting.  Genitourinary: Negative.   Musculoskeletal: Negative.   Skin: Negative.   Neurological: Negative.   Endo/Heme/Allergies: Negative.   Psychiatric/Behavioral: Negative.     All other ROS negative except what is listed above and in the HPI.      Objective:    BP (!) 145/90   Pulse 78   Temp 98 F (36.7 C) (Oral)   Ht 5' 5 (1.651 m)   Wt 155 lb 12.8 oz (70.7 kg)   SpO2 97%   BMI 25.93 kg/m   Wt Readings from Last 3 Encounters:  04/24/24 155 lb 12.8 oz (70.7 kg)  08/23/23 155 lb (70.3 kg)  05/24/23 155 lb 6.4 oz (70.5 kg)    Physical Exam Vitals and nursing note reviewed.  Constitutional:      General: She is not in acute distress.    Appearance: Normal appearance. She is not ill-appearing, toxic-appearing or diaphoretic.  HENT:     Head: Normocephalic and atraumatic.     Right Ear: Tympanic membrane, ear canal and external ear normal. There is no impacted  cerumen.     Left Ear: Tympanic membrane, ear canal and external ear normal. There is no impacted cerumen.     Nose: Nose normal. No congestion or rhinorrhea.     Mouth/Throat:     Mouth: Mucous membranes are moist.     Pharynx: Oropharynx is clear. No oropharyngeal exudate or posterior oropharyngeal erythema.  Eyes:     General: No scleral icterus.       Right eye: No discharge.        Left eye: No discharge.     Extraocular Movements: Extraocular movements intact.     Conjunctiva/sclera: Conjunctivae normal.     Pupils: Pupils are equal, round, and reactive to light.  Neck:     Vascular: No carotid bruit.  Cardiovascular:     Rate and Rhythm: Normal rate and  regular rhythm.     Pulses: Normal pulses.     Heart sounds: No murmur heard.    No friction rub. No gallop.  Pulmonary:     Effort: Pulmonary effort is normal. No respiratory distress.     Breath sounds: Normal breath sounds. No stridor. No wheezing, rhonchi or rales.  Chest:     Chest wall: No tenderness.  Abdominal:     General: Abdomen is flat. Bowel sounds are normal. There is no distension.     Palpations: Abdomen is soft. There is no mass.     Tenderness: There is no abdominal tenderness. There is no right CVA tenderness, left CVA tenderness, guarding or rebound.     Hernia: No hernia is present.  Genitourinary:    Comments: Breast and pelvic exams deferred with shared decision making Musculoskeletal:        General: No swelling, tenderness, deformity or signs of injury.     Cervical back: Normal range of motion and neck supple. No rigidity. No muscular tenderness.     Right lower leg: No edema.     Left lower leg: No edema.  Lymphadenopathy:     Cervical: No cervical adenopathy.  Skin:    General: Skin is warm and dry.     Capillary Refill: Capillary refill takes less than 2 seconds.     Coloration: Skin is not jaundiced or pale.     Findings: No bruising, erythema, lesion or rash.  Neurological:      General: No focal deficit present.     Mental Status: She is alert and oriented to person, place, and time. Mental status is at baseline.     Cranial Nerves: No cranial nerve deficit.     Sensory: No sensory deficit.     Motor: No weakness.     Coordination: Coordination normal.     Gait: Gait normal.     Deep Tendon Reflexes: Reflexes normal.  Psychiatric:        Mood and Affect: Mood normal.        Behavior: Behavior normal.        Thought Content: Thought content normal.        Judgment: Judgment normal.     Results for orders placed or performed in visit on 08/23/23  CBC with Differential/Platelet   Collection Time: 08/23/23  3:29 PM  Result Value Ref Range   WBC 8.0 3.4 - 10.8 x10E3/uL   RBC 4.16 3.77 - 5.28 x10E6/uL   Hemoglobin 14.0 11.1 - 15.9 g/dL   Hematocrit 58.5 65.9 - 46.6 %   MCV 100 (H) 79 - 97 fL   MCH 33.7 (H) 26.6 - 33.0 pg   MCHC 33.8 31.5 - 35.7 g/dL   RDW 86.3 88.2 - 84.5 %   Platelets 288 150 - 450 x10E3/uL   Neutrophils 52 Not Estab. %   Lymphs 37 Not Estab. %   Monocytes 8 Not Estab. %   Eos 3 Not Estab. %   Basos 0 Not Estab. %   Neutrophils Absolute 4.1 1.4 - 7.0 x10E3/uL   Lymphocytes Absolute 3.0 0.7 - 3.1 x10E3/uL   Monocytes Absolute 0.6 0.1 - 0.9 x10E3/uL   EOS (ABSOLUTE) 0.2 0.0 - 0.4 x10E3/uL   Basophils Absolute 0.0 0.0 - 0.2 x10E3/uL   Immature Granulocytes 0 Not Estab. %   Immature Grans (Abs) 0.0 0.0 - 0.1 x10E3/uL  Comprehensive metabolic panel with GFR   Collection Time: 08/23/23  3:29 PM  Result Value Ref Range  Glucose 106 (H) 70 - 99 mg/dL   BUN 16 6 - 24 mg/dL   Creatinine, Ser 9.25 0.57 - 1.00 mg/dL   eGFR 95 >40 fO/fpw/8.26   BUN/Creatinine Ratio 22 9 - 23   Sodium 141 134 - 144 mmol/L   Potassium 4.4 3.5 - 5.2 mmol/L   Chloride 104 96 - 106 mmol/L   CO2 19 (L) 20 - 29 mmol/L   Calcium  9.3 8.7 - 10.2 mg/dL   Total Protein 6.7 6.0 - 8.5 g/dL   Albumin 4.4 3.8 - 4.9 g/dL   Globulin, Total 2.3 1.5 - 4.5 g/dL    Bilirubin Total 0.2 0.0 - 1.2 mg/dL   Alkaline Phosphatase 171 (H) 44 - 121 IU/L   AST 36 0 - 40 IU/L   ALT 39 (H) 0 - 32 IU/L  Lipid Panel w/o Chol/HDL Ratio   Collection Time: 08/23/23  3:29 PM  Result Value Ref Range   Cholesterol, Total 177 100 - 199 mg/dL   Triglycerides 474 (H) 0 - 149 mg/dL   HDL 48 >60 mg/dL   VLDL Cholesterol Cal 77 (H) 5 - 40 mg/dL   LDL Chol Calc (NIH) 52 0 - 99 mg/dL      Assessment & Plan:   Problem List Items Addressed This Visit       Cardiovascular and Mediastinum   Essential hypertension   Running high. Will increase her lisinopril  to 30mg  and recheck in 6 weeks. Call with any concerns. Labs drawn today.       Relevant Medications   lisinopril  (ZESTRIL ) 30 MG tablet   metoprolol  succinate (TOPROL -XL) 100 MG 24 hr tablet   rosuvastatin  (CRESTOR ) 10 MG tablet   Other Relevant Orders   Comprehensive metabolic panel with GFR   Microalbumin, Urine Waived     Other   Depression, recurrent   Under good control on current regimen. Continue current regimen. Continue to monitor. Call with any concerns. Refills given.       Relevant Medications   DULoxetine  (CYMBALTA ) 60 MG capsule   Other Relevant Orders   Comprehensive metabolic panel with GFR   Hyperlipidemia   Under good control on current regimen. Continue current regimen. Continue to monitor. Call with any concerns. Refills given. Labs drawn today.       Relevant Medications   lisinopril  (ZESTRIL ) 30 MG tablet   metoprolol  succinate (TOPROL -XL) 100 MG 24 hr tablet   rosuvastatin  (CRESTOR ) 10 MG tablet   Other Relevant Orders   Comprehensive metabolic panel with GFR   Lipid Panel w/o Chol/HDL Ratio   Other Visit Diagnoses       Routine general medical examination at a health care facility    -  Primary   Vaccines up to date. Screening labs checked today. Pap N/A. Mammo and colonoscopy up to date. Continue diet and exercise. Call with any concerns.   Relevant Orders   CBC with  Differential/Platelet   Comprehensive metabolic panel with GFR   Lipid Panel w/o Chol/HDL Ratio   TSH   Hepatitis B surface antibody,quantitative     Hot flashes       Will start gabapentin . Call with any concerns. Recheck about 6 weeks.   Relevant Medications   lisinopril  (ZESTRIL ) 30 MG tablet   metoprolol  succinate (TOPROL -XL) 100 MG 24 hr tablet   rosuvastatin  (CRESTOR ) 10 MG tablet     Encounter for screening mammogram for malignant neoplasm of breast       Mammogram ordered today.  Relevant Orders   MM 3D SCREENING MAMMOGRAM BILATERAL BREAST     Need for influenza vaccination       Flu shot given today.   Relevant Orders   Flu vaccine trivalent PF, 6mos and older(Flulaval,Afluria,Fluarix,Fluzone)        Follow up plan: Return in about 6 weeks (around 06/05/2024).   LABORATORY TESTING:  - Pap smear: not applicable  IMMUNIZATIONS:   - Tdap: Tetanus vaccination status reviewed: last tetanus booster within 10 years. - Influenza: Administered today - Pneumovax: Up to date - Prevnar: Up to date - COVID: Refused - HPV: Not applicable - Shingrix vaccine: Up to date  SCREENING: -Mammogram: Ordered today  - Colonoscopy: Up to date   PATIENT COUNSELING:   Advised to take 1 mg of folate supplement per day if capable of pregnancy.   Sexuality: Discussed sexually transmitted diseases, partner selection, use of condoms, avoidance of unintended pregnancy  and contraceptive alternatives.   Advised to avoid cigarette smoking.  I discussed with the patient that most people either abstain from alcohol or drink within safe limits (<=14/week and <=4 drinks/occasion for males, <=7/weeks and <= 3 drinks/occasion for females) and that the risk for alcohol disorders and other health effects rises proportionally with the number of drinks per week and how often a drinker exceeds daily limits.  Discussed cessation/primary prevention of drug use and availability of treatment for abuse.    Diet: Encouraged to adjust caloric intake to maintain  or achieve ideal body weight, to reduce intake of dietary saturated fat and total fat, to limit sodium intake by avoiding high sodium foods and not adding table salt, and to maintain adequate dietary potassium and calcium  preferably from fresh fruits, vegetables, and low-fat dairy products.    stressed the importance of regular exercise  Injury prevention: Discussed safety belts, safety helmets, smoke detector, smoking near bedding or upholstery.   Dental health: Discussed importance of regular tooth brushing, flossing, and dental visits.    NEXT PREVENTATIVE PHYSICAL DUE IN 1 YEAR. Return in about 6 weeks (around 06/05/2024).              [1]  Allergies Allergen Reactions   Wellbutrin  [email or web address] ] Other (See Comments)    irritiability   Urocit-K [Potassium Citrate] Nausea Only  [2]  Social History Tobacco Use  Smoking Status Every Day   Current packs/day: 0.50   Types: Cigarettes  Smokeless Tobacco Never   "

## 2024-04-24 NOTE — Assessment & Plan Note (Signed)
 Under good control on current regimen. Continue current regimen. Continue to monitor. Call with any concerns. Refills given.

## 2024-04-24 NOTE — Assessment & Plan Note (Signed)
 Running high. Will increase her lisinopril  to 30mg  and recheck in 6 weeks. Call with any concerns. Labs drawn today.

## 2024-04-24 NOTE — Assessment & Plan Note (Signed)
 Under good control on current regimen. Continue current regimen. Continue to monitor. Call with any concerns. Refills given. Labs drawn today.

## 2024-04-25 LAB — TSH: TSH: 2.32 u[IU]/mL (ref 0.450–4.500)

## 2024-04-25 LAB — CBC WITH DIFFERENTIAL/PLATELET
Basophils Absolute: 0 x10E3/uL (ref 0.0–0.2)
Basos: 0 %
EOS (ABSOLUTE): 0.1 x10E3/uL (ref 0.0–0.4)
Eos: 2 %
Hematocrit: 39.6 % (ref 34.0–46.6)
Hemoglobin: 13.6 g/dL (ref 11.1–15.9)
Immature Grans (Abs): 0 x10E3/uL (ref 0.0–0.1)
Immature Granulocytes: 0 %
Lymphocytes Absolute: 2.6 x10E3/uL (ref 0.7–3.1)
Lymphs: 37 %
MCH: 34.2 pg — ABNORMAL HIGH (ref 26.6–33.0)
MCHC: 34.3 g/dL (ref 31.5–35.7)
MCV: 100 fL — ABNORMAL HIGH (ref 79–97)
Monocytes Absolute: 0.4 x10E3/uL (ref 0.1–0.9)
Monocytes: 5 %
Neutrophils Absolute: 3.9 x10E3/uL (ref 1.4–7.0)
Neutrophils: 56 %
Platelets: 314 x10E3/uL (ref 150–450)
RBC: 3.98 x10E6/uL (ref 3.77–5.28)
RDW: 13.5 % (ref 11.7–15.4)
WBC: 7 x10E3/uL (ref 3.4–10.8)

## 2024-04-25 LAB — COMPREHENSIVE METABOLIC PANEL WITH GFR
ALT: 55 IU/L — ABNORMAL HIGH (ref 0–32)
AST: 45 IU/L — ABNORMAL HIGH (ref 0–40)
Albumin: 4.5 g/dL (ref 3.8–4.9)
Alkaline Phosphatase: 143 IU/L — ABNORMAL HIGH (ref 49–135)
BUN/Creatinine Ratio: 20 (ref 9–23)
BUN: 14 mg/dL (ref 6–24)
Bilirubin Total: 0.3 mg/dL (ref 0.0–1.2)
CO2: 21 mmol/L (ref 20–29)
Calcium: 9.7 mg/dL (ref 8.7–10.2)
Chloride: 104 mmol/L (ref 96–106)
Creatinine, Ser: 0.71 mg/dL (ref 0.57–1.00)
Globulin, Total: 2.1 g/dL (ref 1.5–4.5)
Glucose: 97 mg/dL (ref 70–99)
Potassium: 4.2 mmol/L (ref 3.5–5.2)
Sodium: 142 mmol/L (ref 134–144)
Total Protein: 6.6 g/dL (ref 6.0–8.5)
eGFR: 99 mL/min/1.73

## 2024-04-25 LAB — HEPATITIS B SURFACE ANTIBODY, QUANTITATIVE: Hepatitis B Surf Ab Quant: 14.6 m[IU]/mL

## 2024-04-25 LAB — LIPID PANEL W/O CHOL/HDL RATIO
Cholesterol, Total: 171 mg/dL (ref 100–199)
HDL: 62 mg/dL
LDL Chol Calc (NIH): 65 mg/dL (ref 0–99)
Triglycerides: 283 mg/dL — ABNORMAL HIGH (ref 0–149)
VLDL Cholesterol Cal: 44 mg/dL — ABNORMAL HIGH (ref 5–40)

## 2024-04-28 ENCOUNTER — Ambulatory Visit: Payer: Self-pay | Admitting: Family Medicine

## 2024-05-23 ENCOUNTER — Encounter

## 2024-06-08 ENCOUNTER — Ambulatory Visit: Admitting: Family Medicine
